# Patient Record
Sex: Female | Born: 1970 | Race: White | Hispanic: No | Smoking: Current every day smoker
Health system: Southern US, Community
[De-identification: ages and names within clinical notes are randomized; demographics above are authoritative.]

## PROBLEM LIST (undated history)

## (undated) DIAGNOSIS — L02612 Cutaneous abscess of left foot: Secondary | ICD-10-CM

## (undated) DIAGNOSIS — I5030 Unspecified diastolic (congestive) heart failure: Secondary | ICD-10-CM

## (undated) DIAGNOSIS — I340 Nonrheumatic mitral (valve) insufficiency: Secondary | ICD-10-CM

## (undated) DIAGNOSIS — F1920 Other psychoactive substance dependence, uncomplicated: Secondary | ICD-10-CM

## (undated) DIAGNOSIS — F329 Major depressive disorder, single episode, unspecified: Secondary | ICD-10-CM

## (undated) DIAGNOSIS — C801 Malignant (primary) neoplasm, unspecified: Secondary | ICD-10-CM

## (undated) DIAGNOSIS — L409 Psoriasis, unspecified: Secondary | ICD-10-CM

## (undated) DIAGNOSIS — I10 Essential (primary) hypertension: Secondary | ICD-10-CM

## (undated) DIAGNOSIS — K219 Gastro-esophageal reflux disease without esophagitis: Secondary | ICD-10-CM

## (undated) DIAGNOSIS — F1120 Opioid dependence, uncomplicated: Secondary | ICD-10-CM

## (undated) DIAGNOSIS — C539 Malignant neoplasm of cervix uteri, unspecified: Secondary | ICD-10-CM

## (undated) DIAGNOSIS — K3184 Gastroparesis: Secondary | ICD-10-CM

## (undated) DIAGNOSIS — F419 Anxiety disorder, unspecified: Secondary | ICD-10-CM

## (undated) DIAGNOSIS — F32A Depression, unspecified: Secondary | ICD-10-CM

## (undated) DIAGNOSIS — B1920 Unspecified viral hepatitis C without hepatic coma: Secondary | ICD-10-CM

## (undated) DIAGNOSIS — M1990 Unspecified osteoarthritis, unspecified site: Secondary | ICD-10-CM

## (undated) DIAGNOSIS — N289 Disorder of kidney and ureter, unspecified: Secondary | ICD-10-CM

## (undated) HISTORY — DX: Nonrheumatic mitral (valve) insufficiency: I34.0

## (undated) HISTORY — DX: Unspecified diastolic (congestive) heart failure: I50.30

## (undated) HISTORY — PX: OTHER SURGICAL HISTORY: SHX169

## (undated) HISTORY — DX: Psoriasis, unspecified: L40.9

## (undated) HISTORY — PX: LASER ABLATION CONDYLOMA CERVICAL / VULVAR: SUR819

## (undated) HISTORY — PX: APPENDECTOMY: SHX54

## (undated) HISTORY — DX: Anxiety disorder, unspecified: F41.9

## (undated) HISTORY — PX: TUBAL LIGATION: SHX77

## (undated) HISTORY — DX: Unspecified osteoarthritis, unspecified site: M19.90

---

## 2000-09-11 ENCOUNTER — Inpatient Hospital Stay (HOSPITAL_COMMUNITY): Admission: EM | Admit: 2000-09-11 | Discharge: 2000-09-15 | Payer: Self-pay | Admitting: *Deleted

## 2002-06-09 ENCOUNTER — Emergency Department (HOSPITAL_COMMUNITY): Admission: EM | Admit: 2002-06-09 | Discharge: 2002-06-09 | Payer: Self-pay | Admitting: Emergency Medicine

## 2005-09-12 ENCOUNTER — Emergency Department (HOSPITAL_COMMUNITY): Admission: EM | Admit: 2005-09-12 | Discharge: 2005-09-12 | Payer: Self-pay | Admitting: Emergency Medicine

## 2006-02-16 ENCOUNTER — Emergency Department (HOSPITAL_COMMUNITY): Admission: EM | Admit: 2006-02-16 | Discharge: 2006-02-16 | Payer: Self-pay | Admitting: Emergency Medicine

## 2006-02-24 ENCOUNTER — Emergency Department (HOSPITAL_COMMUNITY): Admission: EM | Admit: 2006-02-24 | Discharge: 2006-02-24 | Payer: Self-pay | Admitting: Emergency Medicine

## 2006-03-07 ENCOUNTER — Emergency Department (HOSPITAL_COMMUNITY): Admission: EM | Admit: 2006-03-07 | Discharge: 2006-03-07 | Payer: Self-pay | Admitting: Emergency Medicine

## 2006-04-09 ENCOUNTER — Emergency Department (HOSPITAL_COMMUNITY): Admission: EM | Admit: 2006-04-09 | Discharge: 2006-04-09 | Payer: Self-pay | Admitting: Emergency Medicine

## 2006-09-10 ENCOUNTER — Emergency Department (HOSPITAL_COMMUNITY): Admission: EM | Admit: 2006-09-10 | Discharge: 2006-09-10 | Payer: Self-pay | Admitting: Emergency Medicine

## 2006-09-12 ENCOUNTER — Emergency Department (HOSPITAL_COMMUNITY): Admission: EM | Admit: 2006-09-12 | Discharge: 2006-09-12 | Payer: Self-pay | Admitting: Emergency Medicine

## 2006-09-14 ENCOUNTER — Emergency Department (HOSPITAL_COMMUNITY): Admission: EM | Admit: 2006-09-14 | Discharge: 2006-09-14 | Payer: Self-pay | Admitting: Emergency Medicine

## 2006-09-15 ENCOUNTER — Emergency Department (HOSPITAL_COMMUNITY): Admission: EM | Admit: 2006-09-15 | Discharge: 2006-09-15 | Payer: Self-pay | Admitting: Emergency Medicine

## 2006-12-31 ENCOUNTER — Emergency Department (HOSPITAL_COMMUNITY): Admission: EM | Admit: 2006-12-31 | Discharge: 2006-12-31 | Payer: Self-pay | Admitting: Emergency Medicine

## 2007-02-02 ENCOUNTER — Emergency Department (HOSPITAL_COMMUNITY): Admission: EM | Admit: 2007-02-02 | Discharge: 2007-02-02 | Payer: Self-pay | Admitting: Emergency Medicine

## 2007-03-31 ENCOUNTER — Observation Stay (HOSPITAL_COMMUNITY): Admission: EM | Admit: 2007-03-31 | Discharge: 2007-04-02 | Payer: Self-pay | Admitting: Emergency Medicine

## 2007-04-01 ENCOUNTER — Ambulatory Visit: Payer: Self-pay | Admitting: Gastroenterology

## 2007-05-27 ENCOUNTER — Emergency Department (HOSPITAL_COMMUNITY): Admission: EM | Admit: 2007-05-27 | Discharge: 2007-05-27 | Payer: Self-pay | Admitting: *Deleted

## 2007-07-10 ENCOUNTER — Ambulatory Visit: Payer: Self-pay | Admitting: Internal Medicine

## 2007-07-10 DIAGNOSIS — K279 Peptic ulcer, site unspecified, unspecified as acute or chronic, without hemorrhage or perforation: Secondary | ICD-10-CM | POA: Insufficient documentation

## 2007-07-10 DIAGNOSIS — K219 Gastro-esophageal reflux disease without esophagitis: Secondary | ICD-10-CM | POA: Insufficient documentation

## 2007-07-10 DIAGNOSIS — M545 Low back pain, unspecified: Secondary | ICD-10-CM | POA: Insufficient documentation

## 2007-07-10 DIAGNOSIS — F329 Major depressive disorder, single episode, unspecified: Secondary | ICD-10-CM

## 2007-07-10 DIAGNOSIS — F411 Generalized anxiety disorder: Secondary | ICD-10-CM | POA: Insufficient documentation

## 2007-07-10 DIAGNOSIS — F32A Depression, unspecified: Secondary | ICD-10-CM | POA: Insufficient documentation

## 2007-07-10 DIAGNOSIS — Z87898 Personal history of other specified conditions: Secondary | ICD-10-CM

## 2007-07-10 DIAGNOSIS — M129 Arthropathy, unspecified: Secondary | ICD-10-CM | POA: Insufficient documentation

## 2007-07-10 DIAGNOSIS — Z8541 Personal history of malignant neoplasm of cervix uteri: Secondary | ICD-10-CM

## 2007-07-10 DIAGNOSIS — I10 Essential (primary) hypertension: Secondary | ICD-10-CM | POA: Insufficient documentation

## 2007-07-10 DIAGNOSIS — L408 Other psoriasis: Secondary | ICD-10-CM

## 2007-07-10 DIAGNOSIS — R05 Cough: Secondary | ICD-10-CM

## 2007-07-10 DIAGNOSIS — E785 Hyperlipidemia, unspecified: Secondary | ICD-10-CM | POA: Insufficient documentation

## 2007-07-10 DIAGNOSIS — R635 Abnormal weight gain: Secondary | ICD-10-CM

## 2007-07-10 DIAGNOSIS — N859 Noninflammatory disorder of uterus, unspecified: Secondary | ICD-10-CM | POA: Insufficient documentation

## 2007-07-13 ENCOUNTER — Telehealth (INDEPENDENT_AMBULATORY_CARE_PROVIDER_SITE_OTHER): Payer: Self-pay | Admitting: *Deleted

## 2007-07-13 ENCOUNTER — Encounter (INDEPENDENT_AMBULATORY_CARE_PROVIDER_SITE_OTHER): Payer: Self-pay | Admitting: Internal Medicine

## 2007-07-13 LAB — CONVERTED CEMR LAB
ALT: 21 units/L (ref 0–35)
Alkaline Phosphatase: 48 units/L (ref 39–117)
Sodium: 141 meq/L (ref 135–145)
Total Bilirubin: 0.2 mg/dL — ABNORMAL LOW (ref 0.3–1.2)
Total Protein: 6.8 g/dL (ref 6.0–8.3)
Triglycerides: 418 mg/dL — ABNORMAL HIGH (ref <150)

## 2007-07-20 ENCOUNTER — Telehealth (INDEPENDENT_AMBULATORY_CARE_PROVIDER_SITE_OTHER): Payer: Self-pay | Admitting: *Deleted

## 2007-07-27 ENCOUNTER — Emergency Department (HOSPITAL_COMMUNITY): Admission: EM | Admit: 2007-07-27 | Discharge: 2007-07-27 | Payer: Self-pay | Admitting: Emergency Medicine

## 2007-07-27 ENCOUNTER — Ambulatory Visit (HOSPITAL_COMMUNITY): Admission: RE | Admit: 2007-07-27 | Discharge: 2007-07-27 | Payer: Self-pay | Admitting: Internal Medicine

## 2007-07-28 ENCOUNTER — Emergency Department (HOSPITAL_COMMUNITY): Admission: EM | Admit: 2007-07-28 | Discharge: 2007-07-28 | Payer: Self-pay | Admitting: Emergency Medicine

## 2007-07-30 ENCOUNTER — Telehealth (INDEPENDENT_AMBULATORY_CARE_PROVIDER_SITE_OTHER): Payer: Self-pay | Admitting: Internal Medicine

## 2007-08-04 ENCOUNTER — Emergency Department (HOSPITAL_COMMUNITY): Admission: EM | Admit: 2007-08-04 | Discharge: 2007-08-04 | Payer: Self-pay | Admitting: Emergency Medicine

## 2007-08-12 ENCOUNTER — Ambulatory Visit: Payer: Self-pay | Admitting: Internal Medicine

## 2007-08-13 ENCOUNTER — Telehealth (INDEPENDENT_AMBULATORY_CARE_PROVIDER_SITE_OTHER): Payer: Self-pay | Admitting: *Deleted

## 2007-08-18 ENCOUNTER — Encounter: Admission: RE | Admit: 2007-08-18 | Discharge: 2007-08-18 | Payer: Self-pay | Admitting: Internal Medicine

## 2007-09-09 ENCOUNTER — Encounter: Admission: RE | Admit: 2007-09-09 | Discharge: 2007-09-09 | Payer: Self-pay | Admitting: Internal Medicine

## 2007-09-19 ENCOUNTER — Emergency Department (HOSPITAL_COMMUNITY): Admission: EM | Admit: 2007-09-19 | Discharge: 2007-09-19 | Payer: Self-pay | Admitting: Emergency Medicine

## 2007-10-16 ENCOUNTER — Encounter (INDEPENDENT_AMBULATORY_CARE_PROVIDER_SITE_OTHER): Payer: Self-pay | Admitting: Internal Medicine

## 2007-10-19 ENCOUNTER — Encounter: Admission: RE | Admit: 2007-10-19 | Discharge: 2007-10-19 | Payer: Self-pay | Admitting: Internal Medicine

## 2007-11-05 ENCOUNTER — Emergency Department (HOSPITAL_COMMUNITY): Admission: EM | Admit: 2007-11-05 | Discharge: 2007-11-05 | Payer: Self-pay | Admitting: Emergency Medicine

## 2007-11-12 ENCOUNTER — Ambulatory Visit: Payer: Self-pay | Admitting: Internal Medicine

## 2007-11-12 DIAGNOSIS — M542 Cervicalgia: Secondary | ICD-10-CM | POA: Insufficient documentation

## 2007-11-13 ENCOUNTER — Telehealth (INDEPENDENT_AMBULATORY_CARE_PROVIDER_SITE_OTHER): Payer: Self-pay | Admitting: *Deleted

## 2007-11-13 LAB — CONVERTED CEMR LAB
BUN: 9 mg/dL (ref 6–23)
Calcium: 9.4 mg/dL (ref 8.4–10.5)
Potassium: 4.2 meq/L (ref 3.5–5.3)
Sodium: 139 meq/L (ref 135–145)

## 2007-11-30 ENCOUNTER — Telehealth (INDEPENDENT_AMBULATORY_CARE_PROVIDER_SITE_OTHER): Payer: Self-pay | Admitting: *Deleted

## 2007-11-30 ENCOUNTER — Ambulatory Visit: Payer: Self-pay | Admitting: Internal Medicine

## 2007-12-04 ENCOUNTER — Emergency Department (HOSPITAL_COMMUNITY): Admission: EM | Admit: 2007-12-04 | Discharge: 2007-12-04 | Payer: Self-pay | Admitting: Emergency Medicine

## 2007-12-10 ENCOUNTER — Encounter (INDEPENDENT_AMBULATORY_CARE_PROVIDER_SITE_OTHER): Payer: Self-pay | Admitting: Internal Medicine

## 2007-12-22 ENCOUNTER — Ambulatory Visit: Payer: Self-pay | Admitting: Gastroenterology

## 2007-12-28 ENCOUNTER — Encounter (INDEPENDENT_AMBULATORY_CARE_PROVIDER_SITE_OTHER): Payer: Self-pay | Admitting: Internal Medicine

## 2008-01-04 ENCOUNTER — Ambulatory Visit (HOSPITAL_COMMUNITY): Admission: RE | Admit: 2008-01-04 | Discharge: 2008-01-04 | Payer: Self-pay | Admitting: Gastroenterology

## 2008-01-04 ENCOUNTER — Ambulatory Visit: Payer: Self-pay | Admitting: Gastroenterology

## 2008-01-04 ENCOUNTER — Encounter: Payer: Self-pay | Admitting: Gastroenterology

## 2008-01-04 HISTORY — PX: ESOPHAGOGASTRODUODENOSCOPY: SHX1529

## 2008-01-19 ENCOUNTER — Emergency Department (HOSPITAL_COMMUNITY): Admission: EM | Admit: 2008-01-19 | Discharge: 2008-01-19 | Payer: Self-pay | Admitting: Emergency Medicine

## 2008-01-28 ENCOUNTER — Ambulatory Visit: Payer: Self-pay | Admitting: Gastroenterology

## 2008-02-15 ENCOUNTER — Encounter (INDEPENDENT_AMBULATORY_CARE_PROVIDER_SITE_OTHER): Payer: Self-pay | Admitting: Internal Medicine

## 2008-02-16 ENCOUNTER — Encounter (INDEPENDENT_AMBULATORY_CARE_PROVIDER_SITE_OTHER): Payer: Self-pay | Admitting: Internal Medicine

## 2008-02-17 ENCOUNTER — Ambulatory Visit: Payer: Self-pay | Admitting: Gastroenterology

## 2008-02-18 ENCOUNTER — Encounter (INDEPENDENT_AMBULATORY_CARE_PROVIDER_SITE_OTHER): Payer: Self-pay | Admitting: Internal Medicine

## 2008-02-23 ENCOUNTER — Encounter (HOSPITAL_COMMUNITY): Admission: RE | Admit: 2008-02-23 | Discharge: 2008-03-24 | Payer: Self-pay | Admitting: Gastroenterology

## 2008-03-24 ENCOUNTER — Emergency Department (HOSPITAL_COMMUNITY): Admission: EM | Admit: 2008-03-24 | Discharge: 2008-03-24 | Payer: Self-pay | Admitting: Emergency Medicine

## 2008-04-17 ENCOUNTER — Emergency Department (HOSPITAL_COMMUNITY): Admission: EM | Admit: 2008-04-17 | Discharge: 2008-04-17 | Payer: Self-pay | Admitting: Emergency Medicine

## 2008-05-02 ENCOUNTER — Emergency Department (HOSPITAL_COMMUNITY): Admission: EM | Admit: 2008-05-02 | Discharge: 2008-05-02 | Payer: Self-pay | Admitting: Emergency Medicine

## 2008-07-18 ENCOUNTER — Emergency Department (HOSPITAL_COMMUNITY): Admission: EM | Admit: 2008-07-18 | Discharge: 2008-07-18 | Payer: Self-pay | Admitting: Emergency Medicine

## 2008-09-01 ENCOUNTER — Ambulatory Visit: Payer: Self-pay | Admitting: Gastroenterology

## 2008-09-15 ENCOUNTER — Encounter: Payer: Self-pay | Admitting: Gastroenterology

## 2008-09-15 ENCOUNTER — Ambulatory Visit (HOSPITAL_COMMUNITY): Admission: RE | Admit: 2008-09-15 | Discharge: 2008-09-15 | Payer: Self-pay | Admitting: Obstetrics and Gynecology

## 2008-09-17 ENCOUNTER — Encounter: Payer: Self-pay | Admitting: Gastroenterology

## 2008-09-17 ENCOUNTER — Emergency Department (HOSPITAL_COMMUNITY): Admission: EM | Admit: 2008-09-17 | Discharge: 2008-09-17 | Payer: Self-pay | Admitting: Emergency Medicine

## 2008-09-19 ENCOUNTER — Telehealth (INDEPENDENT_AMBULATORY_CARE_PROVIDER_SITE_OTHER): Payer: Self-pay

## 2008-09-19 LAB — CONVERTED CEMR LAB
Hgb A1c MFr Bld: 5.6 % (ref 4.6–6.1)
TSH: 1.214 microintl units/mL (ref 0.350–4.50)

## 2008-09-20 ENCOUNTER — Telehealth (INDEPENDENT_AMBULATORY_CARE_PROVIDER_SITE_OTHER): Payer: Self-pay | Admitting: *Deleted

## 2008-09-21 ENCOUNTER — Encounter: Payer: Self-pay | Admitting: Gastroenterology

## 2008-09-21 LAB — CONVERTED CEMR LAB
BUN: 9 mg/dL (ref 6–23)
Calcium: 9.2 mg/dL (ref 8.4–10.5)
Cortisol - AM: 8.5 ug/dL (ref 4.3–22.4)
Potassium: 4.1 meq/L (ref 3.5–5.3)

## 2008-09-22 LAB — CONVERTED CEMR LAB: Hemoglobin: 12.1 g/dL (ref 12.0–15.0)

## 2008-09-23 ENCOUNTER — Ambulatory Visit: Payer: Self-pay | Admitting: Gastroenterology

## 2008-09-23 ENCOUNTER — Encounter: Payer: Self-pay | Admitting: Gastroenterology

## 2008-09-23 DIAGNOSIS — K3184 Gastroparesis: Secondary | ICD-10-CM

## 2008-10-02 ENCOUNTER — Emergency Department (HOSPITAL_COMMUNITY): Admission: EM | Admit: 2008-10-02 | Discharge: 2008-10-02 | Payer: Self-pay | Admitting: Emergency Medicine

## 2008-10-04 ENCOUNTER — Encounter (HOSPITAL_COMMUNITY): Admission: RE | Admit: 2008-10-04 | Discharge: 2008-11-03 | Payer: Self-pay | Admitting: Emergency Medicine

## 2008-10-07 ENCOUNTER — Telehealth (INDEPENDENT_AMBULATORY_CARE_PROVIDER_SITE_OTHER): Payer: Self-pay

## 2008-12-30 ENCOUNTER — Encounter: Payer: Self-pay | Admitting: Gastroenterology

## 2009-01-22 ENCOUNTER — Emergency Department (HOSPITAL_COMMUNITY): Admission: EM | Admit: 2009-01-22 | Discharge: 2009-01-22 | Payer: Self-pay | Admitting: Emergency Medicine

## 2009-02-01 ENCOUNTER — Ambulatory Visit (HOSPITAL_COMMUNITY): Admission: RE | Admit: 2009-02-01 | Discharge: 2009-02-01 | Payer: Self-pay | Admitting: Obstetrics and Gynecology

## 2009-02-16 ENCOUNTER — Emergency Department (HOSPITAL_COMMUNITY): Admission: EM | Admit: 2009-02-16 | Discharge: 2009-02-16 | Payer: Self-pay | Admitting: Emergency Medicine

## 2009-02-18 ENCOUNTER — Emergency Department (HOSPITAL_COMMUNITY): Admission: EM | Admit: 2009-02-18 | Discharge: 2009-02-18 | Payer: Self-pay | Admitting: Emergency Medicine

## 2009-03-09 ENCOUNTER — Emergency Department (HOSPITAL_COMMUNITY): Admission: EM | Admit: 2009-03-09 | Discharge: 2009-03-09 | Payer: Self-pay | Admitting: Emergency Medicine

## 2009-03-12 ENCOUNTER — Emergency Department (HOSPITAL_COMMUNITY): Admission: EM | Admit: 2009-03-12 | Discharge: 2009-03-12 | Payer: Self-pay | Admitting: Emergency Medicine

## 2009-03-28 ENCOUNTER — Ambulatory Visit (HOSPITAL_COMMUNITY): Admission: RE | Admit: 2009-03-28 | Discharge: 2009-03-28 | Payer: Self-pay | Admitting: Obstetrics and Gynecology

## 2009-04-19 ENCOUNTER — Emergency Department (HOSPITAL_COMMUNITY): Admission: EM | Admit: 2009-04-19 | Discharge: 2009-04-19 | Payer: Self-pay | Admitting: Emergency Medicine

## 2009-04-21 ENCOUNTER — Encounter: Payer: Self-pay | Admitting: Gastroenterology

## 2009-05-10 ENCOUNTER — Encounter: Payer: Self-pay | Admitting: Urgent Care

## 2009-05-15 ENCOUNTER — Emergency Department (HOSPITAL_COMMUNITY): Admission: EM | Admit: 2009-05-15 | Discharge: 2009-05-15 | Payer: Self-pay | Admitting: Emergency Medicine

## 2009-05-25 ENCOUNTER — Ambulatory Visit (HOSPITAL_COMMUNITY): Admission: RE | Admit: 2009-05-25 | Discharge: 2009-05-25 | Payer: Self-pay | Admitting: Obstetrics and Gynecology

## 2009-06-20 ENCOUNTER — Emergency Department (HOSPITAL_COMMUNITY): Admission: EM | Admit: 2009-06-20 | Discharge: 2009-06-20 | Payer: Self-pay | Admitting: Emergency Medicine

## 2009-12-19 ENCOUNTER — Emergency Department (HOSPITAL_COMMUNITY): Admission: EM | Admit: 2009-12-19 | Discharge: 2009-12-19 | Payer: Self-pay | Admitting: Emergency Medicine

## 2010-02-06 ENCOUNTER — Emergency Department (HOSPITAL_COMMUNITY): Admission: EM | Admit: 2010-02-06 | Discharge: 2010-02-06 | Payer: Self-pay | Admitting: Emergency Medicine

## 2010-02-28 ENCOUNTER — Emergency Department (HOSPITAL_COMMUNITY): Admission: EM | Admit: 2010-02-28 | Discharge: 2010-02-28 | Payer: Self-pay | Admitting: Emergency Medicine

## 2010-03-06 ENCOUNTER — Emergency Department (HOSPITAL_COMMUNITY): Admission: EM | Admit: 2010-03-06 | Discharge: 2010-03-07 | Payer: Self-pay | Admitting: Emergency Medicine

## 2010-05-09 ENCOUNTER — Encounter (HOSPITAL_COMMUNITY): Admission: RE | Admit: 2010-05-09 | Payer: Self-pay | Admitting: Orthopaedic Surgery

## 2010-07-11 ENCOUNTER — Emergency Department (HOSPITAL_COMMUNITY)
Admission: EM | Admit: 2010-07-11 | Discharge: 2010-07-12 | Payer: Self-pay | Source: Home / Self Care | Admitting: Emergency Medicine

## 2010-08-12 ENCOUNTER — Encounter: Payer: Self-pay | Admitting: Internal Medicine

## 2010-08-12 ENCOUNTER — Encounter: Payer: Self-pay | Admitting: Obstetrics and Gynecology

## 2010-08-13 ENCOUNTER — Encounter: Payer: Self-pay | Admitting: Obstetrics and Gynecology

## 2010-10-01 LAB — CBC
HCT: 38.7 % (ref 36.0–46.0)
MCV: 92.4 fL (ref 78.0–100.0)
RBC: 4.19 MIL/uL (ref 3.87–5.11)
WBC: 12.4 10*3/uL — ABNORMAL HIGH (ref 4.0–10.5)

## 2010-10-01 LAB — POCT CARDIAC MARKERS
CKMB, poc: <1 ng/mL — ABNORMAL LOW (ref 1.0–8.0)
Myoglobin, poc: 21.1 ng/mL (ref 12–200)
Troponin i, poc: <0.05 ng/mL (ref 0.00–0.09)
Troponin i, poc: <0.05 ng/mL (ref 0.00–0.09)

## 2010-10-01 LAB — DIFFERENTIAL
Eosinophils Relative: 1 % (ref 0–5)
Lymphocytes Relative: 31 % (ref 12–46)
Lymphs Abs: 3.8 10*3/uL (ref 0.7–4.0)
Monocytes Absolute: 0.5 10*3/uL (ref 0.1–1.0)
Monocytes Relative: 4 % (ref 3–12)

## 2010-10-01 LAB — BASIC METABOLIC PANEL
CO2: 27 mEq/L (ref 19–32)
Sodium: 139 mEq/L (ref 135–145)

## 2010-10-05 LAB — URINE MICROSCOPIC-ADD ON

## 2010-10-05 LAB — BASIC METABOLIC PANEL
BUN: 9 mg/dL (ref 6–23)
Calcium: 9.1 mg/dL (ref 8.4–10.5)
Creatinine, Ser: 0.58 mg/dL (ref 0.4–1.2)
GFR calc non Af Amer: >60 mL/min (ref >60)
Glucose, Bld: 118 mg/dL — ABNORMAL HIGH (ref 70–99)

## 2010-10-05 LAB — POCT I-STAT, CHEM 8
Chloride: 108 mEq/L (ref 96–112)
Glucose, Bld: 76 mg/dL (ref 70–99)
HCT: 39 % (ref 36.0–46.0)
Potassium: 4.1 mEq/L (ref 3.5–5.1)

## 2010-10-05 LAB — URINALYSIS, ROUTINE W REFLEX MICROSCOPIC
Bilirubin Urine: NEGATIVE
Bilirubin Urine: NEGATIVE
Ketones, ur: NEGATIVE mg/dL
Nitrite: NEGATIVE
Nitrite: NEGATIVE
Specific Gravity, Urine: >1.03 — ABNORMAL HIGH (ref 1.005–1.030)
pH: 5.5 (ref 5.0–8.0)
pH: 6 (ref 5.0–8.0)

## 2010-10-06 LAB — URINE MICROSCOPIC-ADD ON

## 2010-10-06 LAB — URINALYSIS, ROUTINE W REFLEX MICROSCOPIC
Bilirubin Urine: NEGATIVE
Ketones, ur: NEGATIVE mg/dL
Nitrite: NEGATIVE
Urobilinogen, UA: 1 mg/dL (ref 0.0–1.0)

## 2010-10-24 LAB — URINALYSIS, ROUTINE W REFLEX MICROSCOPIC
Glucose, UA: NEGATIVE mg/dL
Leukocytes, UA: NEGATIVE
Specific Gravity, Urine: 1.025 (ref 1.005–1.030)
pH: 6.5 (ref 5.0–8.0)

## 2010-10-24 LAB — URINE MICROSCOPIC-ADD ON

## 2010-10-24 LAB — BASIC METABOLIC PANEL
BUN: 11 mg/dL (ref 6–23)
Calcium: 9.6 mg/dL (ref 8.4–10.5)
Creatinine, Ser: 0.47 mg/dL (ref 0.4–1.2)
GFR calc non Af Amer: >60 mL/min (ref >60)
Glucose, Bld: 88 mg/dL (ref 70–99)
Potassium: 3.6 mEq/L (ref 3.5–5.1)

## 2010-10-24 LAB — RAPID STREP SCREEN (MED CTR MEBANE ONLY): Streptococcus, Group A Screen (Direct): NEGATIVE

## 2010-10-26 LAB — HCG, QUANTITATIVE, PREGNANCY: hCG, Beta Chain, Quant, S: <2 m[IU]/mL (ref <5)

## 2010-10-26 LAB — BASIC METABOLIC PANEL
BUN: 10 mg/dL (ref 6–23)
Calcium: 9.9 mg/dL (ref 8.4–10.5)
Chloride: 103 mEq/L (ref 96–112)
Creatinine, Ser: 0.53 mg/dL (ref 0.4–1.2)
GFR calc Af Amer: >60 mL/min (ref >60)

## 2010-10-26 LAB — CBC
MCV: 90.6 fL (ref 78.0–100.0)
Platelets: 326 10*3/uL (ref 150–400)
WBC: 9.8 10*3/uL (ref 4.0–10.5)

## 2010-11-28 ENCOUNTER — Emergency Department (HOSPITAL_COMMUNITY)
Admission: EM | Admit: 2010-11-28 | Discharge: 2010-11-28 | Disposition: A | Discharge status: Home / Self Care | Payer: Medicaid Other | Attending: Emergency Medicine | Admitting: Emergency Medicine

## 2010-11-28 ENCOUNTER — Emergency Department (HOSPITAL_COMMUNITY): Payer: Medicaid Other

## 2010-11-28 DIAGNOSIS — M5430 Sciatica, unspecified side: Secondary | ICD-10-CM | POA: Insufficient documentation

## 2010-11-28 DIAGNOSIS — L408 Other psoriasis: Secondary | ICD-10-CM | POA: Insufficient documentation

## 2010-11-28 DIAGNOSIS — L259 Unspecified contact dermatitis, unspecified cause: Secondary | ICD-10-CM | POA: Insufficient documentation

## 2010-11-28 DIAGNOSIS — F329 Major depressive disorder, single episode, unspecified: Secondary | ICD-10-CM | POA: Insufficient documentation

## 2010-11-28 DIAGNOSIS — R112 Nausea with vomiting, unspecified: Secondary | ICD-10-CM | POA: Insufficient documentation

## 2010-11-28 DIAGNOSIS — Z8541 Personal history of malignant neoplasm of cervix uteri: Secondary | ICD-10-CM | POA: Insufficient documentation

## 2010-11-28 DIAGNOSIS — Z79899 Other long term (current) drug therapy: Secondary | ICD-10-CM | POA: Insufficient documentation

## 2010-11-28 DIAGNOSIS — F3289 Other specified depressive episodes: Secondary | ICD-10-CM | POA: Insufficient documentation

## 2010-11-28 DIAGNOSIS — I10 Essential (primary) hypertension: Secondary | ICD-10-CM | POA: Insufficient documentation

## 2010-11-28 DIAGNOSIS — M549 Dorsalgia, unspecified: Secondary | ICD-10-CM | POA: Insufficient documentation
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
## 2010-12-04 NOTE — Consult Note (Signed)
[name], [name]           ACCOUNT NO.:  [id number]   MEDICAL RECORD NO.:  [id number]          PATIENT TYPE:  OBV   LOCATION:  A306                          FACILITY:  [facility]   PHYSICIAN:  Tilda Burrow, M.D. DATE OF BIRTH:  [birth year]   DATE OF CONSULTATION:  04/01/2007  DATE OF DISCHARGE:                                 CONSULTATION   REASON FOR CONSULTATION:  Consulted regarding heavy periods and pelvic  pain.   CHIEF COMPLAINT:  For this admission is abdominal pain in the right  lower quadrant which the patient describes as sharp in nature, of 2 to 3  days' duration as described in Dr. Vara Guardian admitting note.   HISTORY OF PRESENT ILLNESS:  Today she is much better and so far CT has  suggested that there is a small fibroid on the uterus and followup  ultrasound indicates a small cyst on the left ovary and within normal-  sized ovaries. There is no suspicion for cul-de-sac fluid. There is no  suspicion for abscess per radiology.   LABORATORY DATA:  Laboratory evaluation includes white count of 10,700  on 9/9, improving to 9,500. The patient has a significant hemodilution  with hemoglobin initially 14.1, but after rehydration hemoglobin is 11.8  which is more likely close to her baseline.   Admitting urinalysis showed a specific gravity of 1.10. Electrolytes are  within normal limits with BUN dropping from 13 to 9 hydration.   REVIEW OF SYSTEMS:  GYN: Notable for a gravida 2, para 2; children ages  75 and 3. Former patient of Dr. Preston Fleeting.   PAST SURGICAL HISTORY:  Taken by Dr. Lilian Kapur indicates that she had  surgery for cervical cancer but the patient denies having abnormal  Paps. We will review Dr. Preston Fleeting records which are available through  our office for this as a followup as an outpatient.   Menses are described as lasting 5 to 7 days, sometimes as much as 9 days  with heavy flow. Last menstrual period was 6 days in length, from the  21st through the  25th, and was accompanied by lots of pelvic discomfort.  She has light clotting with her periods and describes her menses as  unbearable with clothing accidents due to heaviness of the flow at  least 1 to 2 times per menstrual cycle. The patient is ready to get rid  of it all.   Ultrasound is reviewed. See x-ray report.   IMPRESSION:  Functional ovarian cysts, small uterus with possible early  fibroid development. No obvious deformity of the endometrial cavity to  explain the heavy bleeding but will evaluate this further as an  outpatient. Given that patient smokes and is over 35 oral contraceptives  are not an option for menstrual control, but will discuss  endometrial ablation or other efforts at solving her heavy menstrual  flow as an outpatient once we have reviewed Dr. Preston Fleeting records  regarding the cervix and update Pap smear, etc.   Thank you for the consult.      Tilda Burrow, M.D.  Electronically Signed     JVF/MEDQ  D:  04/01/2007  T:  04/02/2007  Job:  [id number]

## 2010-12-04 NOTE — Assessment & Plan Note (Signed)
[name], ROSEMAN            CHART#:  [id number]   DATE:  02/17/2008                       DOB:  02-Sep-1970   PRIMARY PHYSICIAN:  Dr. Reynolds Bowl   PROBLEM LIST:  1. Erosive esophagitis on EGD in June 2009.  2. Uncontrolled gastroesophageal reflux disease on b.i.d. proton pump      inhibitor, confirmed by Bravo study in June 2009.  3. Hypertension.  4. Hyperlipidemia.  5. Fibroids.  6. Anxiety.  7. Insomnia.   SUBJECTIVE:  The patient is a 40 year old female who presents as a  return patient visit.  She was last seen in July 2009.  She was changed  from Prilosec to Capadex.  She reports that the Capadex is not helping.  She is taking it 30 minutes prior to meals.  She has not had a surgery  referral.  She still smokes half-a-pack a day.  She has gained 8 pounds  since July 2009, and 18 pounds since September 2008.  She reports  coughing which leads to vomiting.  When she bends over, liquid contents  comes up in her mouth and then she vomits.  Last night she had problems  all night long.  The last time she had problems was 2-3 days before  that.  She continues on the Capadex twice a day.   MEDICATIONS:  1. Capadex twice a day.  2. Lisinopril.  3. Klonopin.  4. Carafate 1 teaspoon 4 times a day.  5. Viscous lidocaine as needed.  6. Tylenol as needed.  7. Amitriptyline 100 mg nightly.   OBJECTIVE:  VITALS SIGNS:  Weight 190 pounds, height 5 feet 3 inches,  BMI 33.7 (obese), temperature 97.9, blood pressure 112/70, and pulse 92.  GENERAL:  She is in no apparent distress.  Alert and oriented x4. LUNGS:  Clear to auscultation bilaterally.CARDIOVASCULAR:  Regular rhythm, no  murmur.  ABDOMEN:  Bowel sounds are present.  Soft, nontender, and nondistended.  No Rebound or guarding, obese.EXTREMITIES:  No cyanosis or edema.SKIN:  Multiple maculopapular lesions on upper and lower extremity.NEUROLOGIC:  No focal neurologic deficits.   ASSESSMENT:  The patient is a  40 year old female who has a known history  of uncontrolled gastroesophageal reflux disease.  Her disease has been  documented on Bravo study.  She has failed b.i.d. PPI therapy and needs  a surgical evaluation.  Thank you for allowing me to see the patient in  consultation.  My recommendations follow.   RECOMMENDATIONS:  1. She may continue the Carafate 1 teaspoon 4 times a day.  She can      stop the viscous lidocaine, which she says does not help.  She can      continue the Kapidex once daily or Nexium twice a day.  She is      given a prescription for both.  She is to fill the one that      Medicaid will pay for.  2. She requested a prescription for Phenergan and is given Phenergan      25 mg tablet half to one p.o. every 6 hours as needed for nausea      and vomiting #30 refill x5.  3. She is asked to discontinue the use of amitriptyline and use      imipramine nightly for pain and for sleep.  She said the  amitriptyline did not help.  She will start off on 50 mg at bedtime      for 3 days and then increase to 100 mg at bedtime.  4. I asked her to completely stop smoking and to lose 10-20 pounds.  5. She is given a referral to Dr. Girtha Rm or Dr. Lovell Sheehan for a Nissen      fundoplication.  6. She will have a gastric emptying study prior to her visit with      surgery.  7. She has a follow up appointment to see me in 2 months.   ADDENDUM:  GES: 52% retained at two hours c/ gastroparesis.       Kassie Mends, M.D.  Electronically Signed     SM/MEDQ  D:  02/17/2008  T:  02/18/2008  Job:  [id number]   cc:   Dr. Reynolds Bowl

## 2010-12-04 NOTE — Op Note (Signed)
[name], [name]           ACCOUNT NO.:  [id number]   MEDICAL RECORD NO.:  [id number]          PATIENT TYPE:  AMB   LOCATION:  DAY                           FACILITY:  [facility]   PHYSICIAN:  Kassie Mends, M.D.      DATE OF BIRTH:  18-Jul-1971   DATE OF PROCEDURE:  01/04/2008  DATE OF DISCHARGE:  01/04/2008                               OPERATIVE REPORT   PROCEDURE:  Bravo capsule study.   REFERRING PHYSICIAN:  Reynolds Bowl.   INDICATION FOR EXAM:  [name] is a 40 year old female who presents  with persistent symptoms of heartburn on Prilosec twice a day.  The  Bravo study is being performed to evaluate for uncontrolled  gastroesophageal reflux disease.   FINDINGS:  On day one the study period lasted 23 hours and 47 minutes.  She had 79 episodes of reflux.  The longest episode of reflux lasted 2  minutes.  She spent 25 minutes at a pH less than 4 in her esophagus.  Her Demeester score on day 1 was  8.8.  Normal is less than 14.72.  On  day 2 the study period lasted 23 hours in 42 minutes.  She had 125  episodes of reflux.  Six episodes lasted greater than 5 minutes.  The  longest episode of reflux was 43 minutes.  She spent 154 minutes at pH  less than 4.  Her Demeester score on day 2 was 33.4.  Her symptoms  association probability was 91% for regurgitation, 82.6% with cough and  36.7% with heartburn.   DIAGNOSIS:  [name] is a 40 year old female who has uncontrolled  gastroesophageal reflux disease on Prilosec twice a day.  She had an  upper endoscopy which revealed evidence of erosive esophagitis in the  distal esophagus.   RECOMMENDATIONS:  1. Will refer [name] for surgery referral for Nissen      fundoplication.  2. She should continue the Prilosec 30 minutes before breakfast and      supper.  3. If her pain persists, then would consider adding a tricyclic      antidepressant.  4. She should continue Carafate 4 times a day and Maalox with viscous   lidocaine as needed for chest pain.      Kassie Mends, M.D.  Electronically Signed     SM/MEDQ  D:  01/18/2008  T:  01/18/2008  Job:  [id number]   cc:   Reynolds Bowl

## 2010-12-04 NOTE — H&P (Signed)
NAMECATHLIN, [name]           ACCOUNT NO.:  [id number]   MEDICAL RECORD NO.:  [id number]          PATIENT TYPE:  OBV   LOCATION:  A306                          FACILITY:  [facility]   PHYSICIAN:  Skeet Latch, DO    DATE OF BIRTH:  [birth year]   DATE OF ADMISSION:  03/31/2007  DATE OF DISCHARGE:  LH                              HISTORY & PHYSICAL   CHIEF COMPLAINT:  Abdominal pain.   HISTORY OF PRESENT ILLNESS:  This is a 40 year old Caucasian female who  presents with abdominal pain.  The patient states for past 2-3 days, she  has been having sharp left right lower quadrant pain.  The patient  states the pain is sharp in nature, rates it 10/10, had some associated  nausea and diarrhea.  The patient also admits to having some abdominal  swelling she believes for the past few months.  The patient has no  significant medical history other than hypertension, headaches and  anxiety.   PAST MEDICAL HISTORY:  As above:  1. Anxiety.  2. Hypertension.  3. Headaches.   SURGICAL HISTORY:  1. Appendectomy.  2  Surgery for cervical cancer.  1. Facial surgery .  2. Tubal ligation.   SOCIAL HISTORY:  Half pack per day smoker for over 20 years denies any  drug use, is a social drinker.   ALLERGIES:  DARVOCET, TORADOL, ULTRACET.   HOME MEDICATION:  1. Xanax 1 mg t.i.d. p.r.n.  2. Celexa unknown dose.  3. HCTZ 25 mg daily.  4. Trazodone, she takes an unknown dose of 1-3 tablets q.h.s.  5. Lipitor 40 mg daily.  6. Cymbalta 60 mg daily.   REVIEW OF SYSTEMS:  CONSTITUTIONAL:  Excessive fatigue, some weight  gain.  HEENT is unremarkable.  GI: Positive nausea, vomiting, diarrhea.  GENITOURINARY: No dysuria, urgency.  MUSCULOSKELETAL: No arthralgias,  arthritis or back pain.  PSYCHIATRIC:  Positive for anxiety.  All other  systems are negative.   PHYSICAL EXAMINATION:  VITAL SIGNS:  Temperature is 90.1, pulse 71,  respirations 20, blood pressure 117/70,  patient saturating 98% on room  air.  HEENT:  Head is atraumatic, normocephalic.  PERLA.  EOMI.  NECK:  Soft, supple, not tender, nondistended.  No JVD or thyromegaly.  CARDIOVASCULAR:  Regular rate and rhythm.  No murmurs, rubs or gallops.  RESPIRATORY:  Lungs clear to auscultation.  No rales, rhonchi or  wheezing.  ABDOMEN:  Soft, a little distended, obese.  She has some mild right  lower quadrant tenderness with some associated epigastric tenderness.  No guarding.  EXTREMITIES: No clubbing, cyanosis or edema.   LABORATORY DATA:  Drug screen was positive for opiates and  benzodiazepines.  Urinalysis was unremarkable.  Urine pregnancy test  negative.  Total bilirubin 0.7, AST is 18, ALT 20, alk-phos 49.  Sodium  135, potassium 3.4, chloride 98, CO2 26, glucose 91, BUN 13, creatinine  0.59.  White count 10.7, hemoglobin 14.1, hematocrit 41.1, platelets  375.  CT of her abdomen showed no abnormal inflammatory processes, small  low density fibroids, fatty liver, mild atherosclerotic aorta  bifurcation disease.  Abdominal ultrasound showed 1.90  cm left ovarian  cyst.   ASSESSMENT:  1. Abdominal pain.  2. Hypertension.  3. History of panic attacks/anxiety.   PLAN:  1. The patient will be admitted.  The patient will be kept n.p.o. with      IV pain medications.  GI will be consulted.  Get labs in a.m. will      which include an amylase and lipase.  2. Hypertension seems to be stable at this time.  Continue home      medications.  3. Panic attack/anxiety.  Patient is currently on Celexa and Xanax.      The patient also will be IV hydrated.  The patient is in      observation.      Skeet Latch, DO  Electronically Signed     SM/MEDQ  D:  03/31/2007  T:  04/01/2007  Job:  [id number]

## 2010-12-04 NOTE — Op Note (Signed)
[name], [name]           ACCOUNT NO.:  [id number]   MEDICAL RECORD NO.:  [id number]          PATIENT TYPE:  AMB   LOCATION:  DAY                           FACILITY:  [facility]   PHYSICIAN:  Kassie Mends, M.D.      DATE OF BIRTH:  1971-05-18   DATE OF PROCEDURE:  01/04/2008  DATE OF DISCHARGE:                               OPERATIVE REPORT   REFERRING PHYSICIAN:  Abbey Chatters. Comstock, MD.   PROCEDURE:  Esophagogastroduodenoscopy with cold forceps biopsy and  Bravo capsule placement.   INDICATION FOR EXAM:  [name] is a 40 year old female who  complaints of refractory heartburn on Prilosec twice a day.  She also  reported having melena associated with her episodes of vomiting.  She  regularly uses Goody powders.   FINDINGS:  1. Linear erosions extending from the squamocolumnar junction      consistent with esophagitis.  Less than a 3-cm segment of her      distal esophagus was involved.  Otherwise, no Barrett's ulceration,      stricture, or mass seen.  2. Hiatal hernia.  Patchy erosions seen in the antrum.  Biopsies      obtained via cold forceps to evaluate for H. pylori gastritis.      Normal duodenal bulb and second portion of the duodenum.  3. Bravo capsule place 28 cm from the teeth. Packwood junction at 34 cm from      the teeth.   DIAGNOSES:  1. Erosive esophagitis on b.i.d. PPI, Bravo study pending.  2. Small hiatal hernia.  3. Gastritis.   RECOMMENDATIONS:  1. Will call [name] with results of her biopsies.  2. She should avoid Goody powders indefinitely.  3. She should continue Prilosec 30 minutes before breakfast and      supper.  4. The Bravo study will last for 48 hours.  5. No aspirin, NSAIDs for 30 days.  No anticoagulation for 5 days.  6. She should follow a high-fiber diet.  She was given a handout on      high-fiber diet, reflux gastritis, and hiatal hernia.  7. She already has a followup appointment to see me in August 2009.  8. Future endoscopy  with propofol.   MEDICATIONS:  1. Demerol 175 mg IV.  2. Versed 12 mg IV.  3. Phenergan 37.5 mg IV.   PROCEDURE TECHNIQUE:  Physical exam was performed.  Informed consent was  obtained from the patient after explaining the benefits, risks, and  alternatives to the procedure.  The patient was connected to the monitor  and placed in the left lateral position.  Continuous oxygen was provided  by nasal cannula.  IV medicine administered through an indwelling  cannula.  After administration of sedation, the patient's esophagus was  intubated and the scope was advanced under direct visualization to the  second portion of the duodenum.  The scope was removed slowly by  carefully examining the color, texture, anatomy, and integrity of the  mucosa on the way out.  The esophagus was then intubated with the Bravo  capsule introducer, and advanced to 28 cm from the  teeth. Suction was  applied for one minute. The capsule was released. The patient's  esophagus was then intubated with the diagnostic gastroscope to confirm  adherence to the side wall. The gastroscope was removed. The patient was  recovered in endoscopy and discharged home in satisfactory condition.   ADDENDUM 16109:  Patient called still having syptoms. May need TCA  and/or surgeyr referral. [facility] on 01/21/08-pt cancelled [facility] for 01/28/08.      Kassie Mends, M.D.  Electronically Signed     SM/MEDQ  D:  01/04/2008  T:  01/05/2008  Job:  [id number]
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
## 2010-12-04 NOTE — Assessment & Plan Note (Signed)
[name], [name]            CHART#:  [id number]   DATE:  01/28/2008                       DOB:  1971/05/02   CHIEF COMPLAINT:  Hurting in my esophagus.   SUBJECTIVE:  The patient is here for a followup visit.  She complains of  refractory reflux symptoms.  She was last seen at the time of EGD and  Bravo capsule placement.  This was on January 04, 2008.  She had linear  erosions extending from the squamocolumnar junction consistent with  esophagitis.  Less than 3-cm segment of her distal esophagus was  involved.  She had a hiatal hernia.  Patchy erosions were seen in her  antrum.  Biopsies were negative for H. pylori.  Bravo capsule was placed  for a 48-hour pH study.  This was done on Prilosec 30 mg b.i.d.  Study  was very abnormal with day one result showing 79 episodes of reflux with  25 minutes at pH less than 4.  On day two, she had 125 episodes of  reflux with 154 minutes at pH less than 4.  Her symptoms association  probably ability with 91% for regurgitation, 82.6% with cough, and 36.7%  with heartburn.  The patient continues to take Prilosec b.i.d., Carafate  1 teaspoon every 4 hours, and viscous lidocaine every 4 hours.  She  states she has constant burning in her chest.  It keeps her up at night  as well.  She has regurgitation 3 to 4 times a day.  She feels bubbling  or boiling in her esophagus.  She states it feels like she has a cup of  soup in her esophagus at all times.  Denies any dysphagia, odynophagia,  nausea, vomiting, abdominal pain, constipation, diarrhea, melena, or  rectal bleeding.  Her weight has been stable.   CURRENT MEDICATIONS:  1. Prilosec b.i.d.  2. Lisinopril 12.5 mg daily.  3. Klonopin 0.5 mg t.i.d.  4. Carafate 1 teaspoon q.4 h.  5. Viscous lidocaine q.4 h. p.r.n.   ALLERGIES:  TORADOL, DARVOCET, and ULTRAM.   PHYSICAL EXAMINATION:  VITAL SIGNS:  Weight 182, temperature 98.4, blood  pressure 100/72, and pulse 80.  GENERAL:  A pleasant,  obese Caucasian female in no acute distress.  ABDOMEN:  Positive bowel sounds.  Soft, nontender, and nondistended.  No  organomegaly or masses.  No rebound or guarding.  SKIN:  Warm and dry.  No jaundice.  Diffuse psoriasis noted.   IMPRESSION:  The patient is a 40 year old lady with refractory  gastroesophageal reflux disease on Prilosec 20 mg b.i.d.  Refractory  gastroesophageal reflux disease well documented by abnormality seen on  EGD as well as positive Bravo pH study.  She has not been on any of the  other proton pump inhibitor therapies.  There has been consideration for  possible referral for Nissen fundoplication.  For now, we will try a  different proton pump inhibitor and see if she has any better response.   PLAN:  1. Stop Prilosec and begin Kapidex 60 mg, 30 minutes before breakfast.      If after 3 days she has had no response, we will go ahead and let      her increase to b.i.d. 30 minutes before breakfast and 30 minutes      before evening meal, #25, samples were provided.  2. She  will call in the first week with the progress report.  3. We will discuss further with Dr. Cira [name] regarding to her plans for      referral for possible Nissen fundoplication.  4. The patient was advised to continue antireflux measures as      discussed.       [name], P.A.  Electronically Signed     [name], M.D.  Electronically Signed    LL/MEDQ  D:  01/28/2008  T:  01/29/2008  Job:  [id number]

## 2010-12-04 NOTE — Assessment & Plan Note (Signed)
[name], [name]            CHART#:  [id number]   DATE:  09/01/2008                       DOB:  Jul 14, 1971   PRIMARY PHYSICIAN:  Abbey Chatters. Jorene Guest, MD   PROBLEM LIST:  1. Erosive esophagitis secondary to uncontrolled gastroesophageal      reflux disease on b.i.d. proton pump inhibitor therapy confirmed by      Bravo study in June 2009.  She is not a surgical candidate due to      gastroparesis and risk of gas/bloat syndrome.  2. Hypertension.  3. Hyperlipidemia.  4. Fibroids.  5. Anxiety.  6. Insomnia.  7. Obesity.   SUBJECTIVE:  The patient is a 40 year old female who presents as a  return patient visit.  She was last seen in July 2009.  She is sent for  gastric emptying study, it showed retention of 52% at 2 hours, which is  consistent with gastroparesis.  She is given a referral to reevaluate  for Nissen fundoplication.  Because of delayed gastric emptying, she is  not a candidate for Nissen fundoplication.  She is supposed to have labs  drawn to complete evaluation for her gastroparesis, but she did not.  She was asked to lose 10-20 pounds and follow a gastroparesis diet.  She  was supposed to followup in October 2009, but was unable to.   She presents today saying her reflux is bad.  Sometimes she has puke in  the back of my throat.  She states she can eat.  She has gained 6  pounds since July 2009.  She has taken the Kapidex twice a day and the  Carafate once a day.  She is taking amitriptyline for sleep, but it does  not help.  She continues to smoke a pack a day and she had questions in  regards to her gastric emptying study.   MEDICATIONS:  1. Kapidex twice a day.  2. Lisinopril.  3. Klonopin.  4. Carafate once a day.  5. Tylenol as needed.  6. Amitriptyline as needed.   OBJECTIVE:  VITAL SIGNS:  Weight 196 pounds, height 5 feet 3 inches, BMI  34.7 (obese), temperature 97.6, blood pressure 130/90, pulse 92.  GENERAL:  She is in no apparent distress.   Alert and oriented x4.HEENT:  Atraumatic, normocephalic.  Pupils equal and react to light.  Mouth, no  oral lesions.  Posterior pharynx without erythema or exudate.NECK:  Full  range of motion.  No lymphadenopathy.LUNGS:  Clear to auscultation  bilaterally. CARDIOVASCULAR:  Regular rhythm.  No murmur.  Normal S1,  S2.ABDOMEN:  Bowel sounds are present, soft, nontender, nondistended,  obese.EXTREMITIES:  No cyanosis or edema.  NEURO:  She has no focal neurologic deficits.   ASSESSMENT:  The patient is a 40 year old female who has multiple habits  that are contributing to her uncontrolled gastroesophageal reflux  disease.  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  Thank you for allowing  me to see the patient in consultation.  My recommendations follow.   RECOMMENDATIONS:  1. Will add Reglan 5 mg twice a day 30 minutes before meals.  It is an      effort to have better control of her gastroesophageal reflux      disease.  I discussed that the medication side effects include  drowsiness, blurry vision, drainage from her breast, involuntary      movements of the hands, neck, and face, or jitteriness.  2. She will also had baclofen 10 mg twice a day 30 minutes before      meals.  I explained to her that may cause nausea and vomiting and      the medications usually used intrathecally for spasm.  3. I gave her a prescription for Phenergan to be used as needed for      nausea.  She also requested a prescription for viscous lidocaine.      I had given a prescription for viscous lidocaine and may take 10 mL      or 20 mL of Maalox every 4 hours as needed for chest pain, #200      refill x5.  4. She is to continue the Kapidex.  5. She is to follow up in 3 months.  I did explain to her that she      needs to stop smoking and lose weight as well as follow a low-fat      diet.  She also requested pain medicine.  I explained  to her that      the combination of Reglan, baclofen, and Kapidex should take care      of her uncontrolled gastroesophageal reflux disease until she is      able to modify her lifestyle factors.  Her medications were sent      electronically.       Kassie Mends, M.D.  Electronically Signed     SM/MEDQ  D:  09/01/2008  T:  09/01/2008  Job:  [id number]   cc:   Abbey Chatters. Jorene Guest, MD

## 2010-12-04 NOTE — Discharge Summary (Signed)
[name], [name]           ACCOUNT NO.:  [id number]   MEDICAL RECORD NO.:  [id number]          PATIENT TYPE:  OBV   LOCATION:  A306                          FACILITY:  [facility]   PHYSICIAN:  Osvaldo Shipper, MD     DATE OF BIRTH:  1971-07-06   DATE OF ADMISSION:  03/31/2007  DATE OF DISCHARGE:  09/11/2008LH                               DISCHARGE SUMMARY   Please review H&P dictated by Dr. Lilian Kapur for details regarding the  patient's presenting illness.   DISCHARGE DIAGNOSES:  1. Abdominal pain likely related to gastroenteritis.  2. Possible uterine fibroid requiring outpatient followup.  3. History of hypertension, stable.  4. History of dyslipidemia, stable.   BRIEF HOSPITAL COURSE:  Briefly, this is a 40 year old Caucasian female  who presented complaining of abdominal pain 2 days ago.  She was having  nausea, vomiting, and diarrhea as well.  The patient was also  complaining of some abdominal swelling or distention for the past few  months.  The patient was seen in the emergency department.  She  underwent blood work which showed mild hypokalemia, otherwise was quite  unremarkable.  She subsequently had a CAT scan of her abdomen and pelvis  which showed fatty liver, mild atherosclerotic changes, suggestion of a  fibroid on the left aspect of the uterus, but otherwise no other  inflammatory  or other findings were noted.  This was followed by an  ultrasound of the pelvic area which showed no significant findings  except for a small cyst on the left ovary.  In the meantime, the patient continued to have significant abdominal  pain.  She was having nausea and emesis as well as diarrhea.  This  prompted a GI consultation and GI felt that she was having  gastroenteritis.  Possibly, her chronic symptoms could be related to  irritable bowel.  They recommended adding Zantac, FloraQ, as well as  Bentyl.  In the meantime, her diarrhea subsided.  She is having very sporadic  episodes of emesis.  She tolerated her dinner last night.  This morning  she had some breakfast too.  She did have 1 small episode of emesis this  morning, none since then.  She was also seen by gynecologist, Dr. Emelda Fear, who recommended  outpatient followup for her gynecological issues.  Laboratory, again,  showed mild hypokalemia.  Her pancreatic enzymes and liver enzymes are  all within normal limits.  So, essentially the etiology for her symptoms  most likely gastroenteritis.  I am not sure of the etiology for her  abdominal distention as of this time, but I think the rest of the workup  can be done by GI and GYN as an outpatient.  This morning the patient is complaining of minimal abdominal pain,  saying that sometimes it is radiating to the back.  The pain is located  in the lower abdomen mostly on the right side.  Her vital signs are all  stable.  She is sitting comfortably on the bed in no distress.  Abdomen  is soft.  There is mild tenderness in the lower area but no rebound,  rigidity,  or guarding is present.   ASSESSMENT/PLAN:  She has been seen by gynecology and  gastroenterologist.  She has had extensive imaging testing done.  There  is no critical issue or urgent issue at this time.  I think the rest of  the workup can be completed outpatient.  She can continue on symptomatic  treatment as an outpatient.   DISCHARGE MEDICATIONS:  1. Bentyl 10 mg t.i.d.  2. Zantac 150 mg every evening.  3. FloraQ 1 tablet p.o. daily.  4. Phenergan 25 mg suppository every 6 hours as needed for nausea.  5. Lortab 5/500, one to two tablets every 6 hours as needed for pain.  6. Xanax 1 mg as needed.  7. Celexa 60 mg p.o. daily.  8. HCTZ 25 mg p.o. daily.  9. Trazodone 100 mg q.h.s. as needed.  10.Lipitor 40 mg p.o. daily.   She will be given one dose of potassium chloride before she leaves the  hospital.   FOLLOWUP:  To be arranged with GI in one week, gynecologist in two weeks  - Dr.  Emelda Fear.   DIET:  She can have a heart healthy diet.   PHYSICAL ACTIVITY:  No restrictions.   CONSULTATIONS:  1. Tilda Burrow, M.D.  2. Kassie Mends, M.D.   TOTAL TIME AT DISCHARGE:  Thirty-five minutes.      Osvaldo Shipper, MD  Electronically Signed     GK/MEDQ  D:  04/02/2007  T:  04/02/2007  Job:  [id number]   cc:   Kassie Mends, M.D.  [address]  [place] , [place] [address]   Tilda Burrow, M.D.  Fax: [phone]

## 2010-12-07 NOTE — Discharge Summary (Signed)
[facility]  Patient:    [name], [name]                  MRN: [id number] Adm. Date:  [id number] Disc. Date: [id number] Attending:  Otilio Saber Dictator:   Johnella Moloney, N.P.                           Discharge Summary  HISTORY OF PRESENT ILLNESS:  Ms. [name] is a 40 year old white separated female on a voluntary admission to [facility] stating "I need help getting off pills."  She described that she was trying to stop her benzodiazepines and Lortab last week for four days by herself and she felt like she was going to die.  The patient reported a one and a half year history of abusing OxyContin, Xanax, and Valium, which she gets off the street.  Originally she linked the feeling that Tylox gave her when it was prescribed for postpartum four years ago and that was the beginning of her addiction.  She has for the past three years been using OxyContin, most recently about 80 mg per day for six months, last use Tuesday, February 19.  Xanax 1 mg taking approximately eight per day for more than six months; last use Tuesday.  Lortab 7.5/500 about 10 per day which were prescribed for her; last use February 19.  She has been feeling depressed for six to seven years since her father died.  Decreased appetite with a weight loss of approximately 18 pounds over the past year, decreased sleep, progressive anhedonia for the past two years, increased crying spells and sadness.  The patients goal is to get into Narcotic Anonymous close to her home.  No homicidal ideation, no suicidal ideation, and no auditory or visual hallucinations.  PAST PSYCHIATRIC HISTORY:  The patient has had no previous psychiatric inpatient or outpatient treatment.  PAST MEDICAL HISTORY:  Primary care physician: Dr. Saddie Benders at [facility].  Medical problems: Psoriasis of hands, history of cervical cancer 14 years ago, status post bilateral  tubal ligation 2001, bone spurs in her neck.  Current medications: Klonopin 1 mg t.i.d., Lortab 7.5/500 one t.i.d. p.r.n.  Drug allergies: No known drug allergies.  Physical examination: Please see her physical examination; basically showed some reddened psoriatic plaques over the dorsal surface of both hands, bilaterally over the finger prominence, over both elbows, and along the dorsal surfaces of both feet.  All areas characterized by crusty plaque, redness, and scattered crusted papules, some redness and crusting across the arch of her ______ bilaterally.  Skin is pale in tone; otherwise smooth.  Otherwise, her physical examination was within normal limits with no positive findings.  Lab work: Hemogram: WBC slightly elevated at 11.2.  CMET: Chloride elevated at 113.  ALP low at 36. Thyroid profile was within normal limits with the exception of T3 uptake high at 41.5; TSH was within normal limits.  Urine drug screen was positive for marijuana.  Urinalysis: Within normal limits.  HOSPITAL COURSE:  The [facility] to detoxify her off opiates and benzodiazepines.  She was placed on a Wytensin protocol to detoxify her off opiates, Klonopin 2 mg t.i.d. to detoxify her off benzodiazepines.  She was put on amoxicillin 500 mg t.i.d. x 10 days for acute sinusitis and Humibid LA one b.i.d. x 5 days.  On February 23, we started Celexa 20 mg daily  and while she was in the hospital, the patient did fairly well.  She did have some signs and symptoms of opiate withdrawal, abdominal cramps and diarrhea.  She was anxious but she appeared to be motivated to get clean.  On February 25, she reported feeling better.  She denied any suicidal ideation, withdrawal symptoms, sleeping and eating well, mood and affect euthymic.  It was decided she could be handled on an outpatient basis safely and it was decided she could be discharged.  CONDITION ON DISCHARGE:  The patient  was discharged in improved condition with improvement in her mood, sleep, and appetite, no suicidal or homicidal ideations, and improvement in her energy, no psychotic symptoms, no signs or symptoms of withdrawal.  DISPOSITION:  The patient was discharged home.  FOLLOWUP: 1. [facility] March 1 at 10:30 a.m. with Serita Sheller. 2. NA daily.  DISCHARGE MEDICATIONS: 1. Celexa 20 mg one tablet q.a.m. 2. Klonopin 1 mg five times a day for one week and then one tablet q.i.d. 3. Amoxicillin 500 mg one tablet t.i.d.  FINAL DIAGNOSES: Axis I:    1. Depression, not otherwise specified.            2. Opiate dependence.            3. Benzodiazepines dependence. Axis II:   Deferred. Axis III:  1. Psoriasis.            2. Bone spurs on her neck. Axis IV:   Mild related to problems with her social environment and substance            abuse. Axis V:    Current global assessment of functioning 53, highest in the past            year 60. DD:  10/13/00 TD:  10/13/00 Job: [id number]

## 2010-12-07 NOTE — H&P (Signed)
[facility]  Patient:    [name], [name]                  MRN: [id number] Adm. Date:  [id number] Attending:  Otilio Saber Dictator:   Young Berry Scott, N.P.                         History and Physical  REVIEW OF SYSTEMS:  Remarkable for patient smokes 1 pack per day of cigarettes for 13 years, with occasional nonproductive cough.  She does complain of a left earache that has been occurring intermittently for the past 2 weeks. NEURO:  Patient denies any history of seizures, history of falling, head injury or memory impairment.  GI:  Patients bowel pattern is twice daily without laxatives.  MSK:  Patient complains of spurs on her neck at the base of her neck and back, which is being treated by an orthopedist. Patient does complain of occasional neck pain, however she states that she has no pain at this time.  REPRODUCTIVE: Patient is status post bilateral tubal ligation. She is just finishing her menses as of yesterday, and denies any problems with STDs, vaginal drainage, or abdominal pain.  PREVENTIVE CARE:  Patient wears corrective lenses.  She last saw her eye doctor within the past year.  Last pap smear was 6 months ago.  She has never had a mammogram in her life.  Date of last dental visit was approximately 8 months ago.  SKIN:  Patient has psoriasis on her hands that she reports and she has a cream at home that she did not bring with her that she does use for this.  PHYSICAL EXAMINATION:  CONSTITUTION:  This is a 40 year old separated white female, slight build, somewhat disheveled.  She is in no acute distress.  She is relaxed and cooperative during the exam.  She is generally healthy in appearance, although her nose is somewhat red and runny, and her vital signs were previously stated.  SKIN:  Patient has reddened psoriatic plaques over the dorsal surface of both hands bilaterally over the fenar prominence, over both elbows and along  the dorsal surfaces of both feet.  All areas are characterized by crusty plaque, redness, and scattered, crusted papules.  Patient also has some redness and crusting across the arch of her pinna bilaterally.  The patients skin is pale in tone and otherwise smooth.  She has thick, blond hair in a shag haircut.  HEAD is normocephalic, craned somewhat forward because of what she states is pain in her neck.  No lesions.  Eyes:  Pupils are round and reactive to light and accommodation; however the right pupil is about 1 mm larger than the left, but they are equally reactive.  Sclera is clear, conjunctiva are clear.  Right external ear canal and TM are within normal limits.  Left external ear canal is clear and the TM is reddened but landmarks are visible with no air-fluid line.  Patient has tenderness to palpation over the left maxillary sinus. Other facial areas are without tenderness.  Nasal mucosa is reddened and boggy with a small amount of yellow mucus present, more so on the left than on the right.  Oral mucosa is pink and moist.  Palatine tonsils are 1+ without redness.  Uvula is midline.  Tongue is midline without fasciculations.  NECK:  No evidence of thyromegaly.  AC and PC nodes are 1+.  CARDIOVASCULAR:  S1 and S2  present, no clicks, murmurs or gallops.  Carotids are 2+ with bruit, no abdominal bruits heard.  Radial and pedal pulses are 2+ and no edema noted.  CHEST is symmetrical, lungs are clear.  ABDOMEN:  Bowel sounds present in all 4 quadrants.  No abdominal tenderness, no CVA tenderness.  GENITALIA:  Deferred.  MUSCULOSKELETAL:  Patients posture is stooped with rounded shoulders, with her neck craned forward.  She has tenderness to palpation that is mild, over C6-7.  She complains of pain with neck hyperextension.  She has full range of motion right, left and  with flexion without pain.  Her gait is normal.  Her strength is 5/5.  NEURO:  Extraocular movements are  intact without nystagmus.  Sensory is intact.  Motor movements are smooth without tremor.  Romberg is with findings. Deep tendon reflexes are 2+ out of 5.  Babinski is negative.  Cerebellar function is intact to rapid alternating movements. DD:  09/12/00 TD:  09/13/00 Job: [id number]

## 2010-12-07 NOTE — H&P (Signed)
[facility]  Patient:    [name], PIGEON                  MRN: [id number] Adm. Date:  [id number] Attending:  Otilio Saber Dictator:   Young Berry Lorin Picket, N.P.                   Psychiatric Admission Assessment  DATE OF ADMISSION:  September 11, 2000.  IDENTIFYING INFORMATION:  This is a 40 year old white separated female who is a voluntary admission to [facility], stating "I need help getting off the pills." She describes that she has tried to stop her benzos and Lortab last week for 4 days by herself and felt like she was going to die.  REASON FOR ADMISSION AND SYMPTOMS:  Patient reports a 1-1/2 year history of abusing OxyContin, Xanax and Valium, which she has gotten off the street. Originally, she liked the feeling that Tylox gave her when it was prescribed for her postpartum 4 years ago and that was the beginning of her addiction. She has for the past 3 years been using OxyContin, most recently about 80 mg per day for more than 6 months, last used Tuesday, February 19, Xanax 1 mg tablets taking approximately 8 per day for more than 6 months, last use Wednesday February 20, and Valium 10 tabs, using up to about 10 per day, last use Tuesday, February 19, and Lortabs 7.5/500 about 10 per day, which were prescribed for her, last used February 19.  Patient also reports feeling depressed for about 6-7 years since her father died, with decreased appetite, with a weight loss of approximately 18 pounds over the past year, decreased sleep, with middle and terminal insomnia, getting approximately 3-4 hours of sleep per night.  She has had progressive anhedonia for the past 2 years and increased crying spells and sadness.  Patients goal is to get into Narcotics Anonymous close to her home.  She denies any homicidal ideation, denies any auditory or visual hallucinations.  PAST PSYCHIATRIC HISTORY:  Negative.  No history of outpatient or  inpatient treatment.  SOCIAL HISTORY:  Patient was born in [place], [place], raised in [place], educated through the 8th grade and has received her GED.  She is currently unemployed.  She has been married x 1 and is now separated.  She has two children, aged 4 and 16 months.  She currently lives with her mother in a house in [place].  She is going through a divorce from her husband who is a crack and OxyContin user, and she does have a supportive boyfriend in her life.  FAMILY HISTORY:  Positive for a father who is an alcoholic, now deceased.  ALCOHOL AND DRUG HISTORY:  Drug history already stated; alcohol history is negative.  PAST MEDICAL HISTORY:  Primary care physician is Dr. Jenelle Mages at [facility].  Medical problems include psoriasis of her hands, history of cervical cancer approximately 14 years ago which was resolved with laser surgery.  She is status post bilateral tubal ligation in 2001, and she currently has bone spurs in her neck and is being treated by an orthopedist in [place].  Medications are Klonopin 1 mg t.i.d. prescribed by Dr. Jenelle Mages and Lortabs 7.5/500 1 t.i.d. p.r.n. which are prescribed by her orthopedist.  DRUG ALLERGIES:  No known drug allergies.  POSITIVE PHYSICAL FINDINGS:  Temp 97.7 on admission, pulse 82, blood pressure 140/85, respirations 20.  She is 5 feet 2 inches tall,  weighs 107 pounds, states that her baseline weight is 125 pounds.  She denies any neck pain at this time.  She is currently having some abdominal cramping.  She has vomited her breakfast and is feeling shaky inside and so p.r.n. medications on the Wytensin protocol were ordered during the course of the interview, and symptoms seemed to subside.  Physical examination and laboratory studies are pending.  MENTAL STATUS EXAMINATION:  This is a casually dressed, slightly disheveled, small build female having abdominal cramps with some nausea,  otherwise is cooperative and pleasant.  Her affect is appropriate.  Her speech is normal in pace and tone.  Mood is sad and depressed.  Thought is logical and goal directed.  Negative for suicidal ideation, homicidal ideation, or auditory or visual hallucinations.  No psychotic symptoms noted.  No delusions noted. Cognitively, she is oriented x 3 and memory is intact.  Her insight is good, her judgment is good.  ADMISSION DIAGNOSES: Axis I:    1. Depression not otherwise specified.            2. Opiate dependence.            3. Benzodiazepine dependence. Axis II:   Deferred. Axis III:  Psoriasis and bone spurs in her neck. Axis IV:   Moderate problems related to the social environment. Axis V:    Current 40, past year 35.  INITIAL PLAN OF CARE:  We will admit this patient for detox from opiates and benzos and for further evaluation of her depression symptoms.  We will put q.15 minute checks in place for safety.  We will begin a Wytensin protocol and place her on Klonopin 1 mg 2 tabs t.i.d. for her benzodiazepine withdrawal.  TENTATIVE LENGTH OF STAY:  Two to four days. DD:  09/12/00 TD:  09/12/00 Job: [id number]

## 2011-01-01 ENCOUNTER — Emergency Department (HOSPITAL_COMMUNITY)
Admission: EM | Admit: 2011-01-01 | Discharge: 2011-01-01 | Disposition: A | Discharge status: Home / Self Care | Payer: Medicaid Other | Attending: Emergency Medicine | Admitting: Emergency Medicine

## 2011-01-01 DIAGNOSIS — H9209 Otalgia, unspecified ear: Secondary | ICD-10-CM | POA: Insufficient documentation

## 2011-01-01 DIAGNOSIS — R112 Nausea with vomiting, unspecified: Secondary | ICD-10-CM | POA: Insufficient documentation

## 2011-01-01 DIAGNOSIS — Z8541 Personal history of malignant neoplasm of cervix uteri: Secondary | ICD-10-CM | POA: Insufficient documentation

## 2011-01-01 DIAGNOSIS — F329 Major depressive disorder, single episode, unspecified: Secondary | ICD-10-CM | POA: Insufficient documentation

## 2011-01-01 DIAGNOSIS — R197 Diarrhea, unspecified: Secondary | ICD-10-CM | POA: Insufficient documentation

## 2011-01-01 DIAGNOSIS — G8929 Other chronic pain: Secondary | ICD-10-CM | POA: Insufficient documentation

## 2011-01-01 DIAGNOSIS — F411 Generalized anxiety disorder: Secondary | ICD-10-CM | POA: Insufficient documentation

## 2011-01-01 DIAGNOSIS — L259 Unspecified contact dermatitis, unspecified cause: Secondary | ICD-10-CM | POA: Insufficient documentation

## 2011-01-01 DIAGNOSIS — M549 Dorsalgia, unspecified: Secondary | ICD-10-CM | POA: Insufficient documentation

## 2011-01-01 DIAGNOSIS — I10 Essential (primary) hypertension: Secondary | ICD-10-CM | POA: Insufficient documentation

## 2011-01-01 DIAGNOSIS — R109 Unspecified abdominal pain: Secondary | ICD-10-CM | POA: Insufficient documentation

## 2011-01-01 DIAGNOSIS — Z79899 Other long term (current) drug therapy: Secondary | ICD-10-CM | POA: Insufficient documentation

## 2011-01-01 DIAGNOSIS — F3289 Other specified depressive episodes: Secondary | ICD-10-CM | POA: Insufficient documentation

## 2011-01-01 DIAGNOSIS — L408 Other psoriasis: Secondary | ICD-10-CM | POA: Insufficient documentation

## 2011-01-01 DIAGNOSIS — H9190 Unspecified hearing loss, unspecified ear: Secondary | ICD-10-CM | POA: Insufficient documentation

## 2011-01-01 LAB — URINALYSIS, ROUTINE W REFLEX MICROSCOPIC
Ketones, ur: NEGATIVE mg/dL
Nitrite: NEGATIVE
Specific Gravity, Urine: >1.03 — ABNORMAL HIGH (ref 1.005–1.030)
Urobilinogen, UA: 0.2 mg/dL (ref 0.0–1.0)

## 2011-01-01 LAB — DIFFERENTIAL
Lymphocytes Relative: 42 % (ref 12–46)
Monocytes Absolute: 0.4 10*3/uL (ref 0.1–1.0)
Monocytes Relative: 4 % (ref 3–12)
Neutro Abs: 4.5 10*3/uL (ref 1.7–7.7)

## 2011-01-01 LAB — CBC
HCT: 37.2 % (ref 36.0–46.0)
Hemoglobin: 12.5 g/dL (ref 12.0–15.0)
MCH: 30.2 pg (ref 26.0–34.0)
MCHC: 33.6 g/dL (ref 30.0–36.0)
MCV: 89.9 fL (ref 78.0–100.0)

## 2011-01-01 LAB — COMPREHENSIVE METABOLIC PANEL
Alkaline Phosphatase: 40 U/L (ref 39–117)
BUN: 14 mg/dL (ref 6–23)
Calcium: 9.9 mg/dL (ref 8.4–10.5)
Creatinine, Ser: 0.59 mg/dL (ref 0.4–1.2)
GFR calc Af Amer: >60 mL/min (ref >60)
Glucose, Bld: 112 mg/dL — ABNORMAL HIGH (ref 70–99)
Total Protein: 6.5 g/dL (ref 6.0–8.3)

## 2011-01-01 LAB — URINE MICROSCOPIC-ADD ON

## 2011-01-01 LAB — LIPASE, BLOOD: Lipase: 13 U/L (ref 11–59)

## 2011-01-28 ENCOUNTER — Emergency Department (HOSPITAL_COMMUNITY)
Admission: EM | Admit: 2011-01-28 | Discharge: 2011-01-28 | Discharge status: Against Medical Advice | Payer: Medicaid Other | Attending: Emergency Medicine | Admitting: Emergency Medicine

## 2011-01-28 ENCOUNTER — Other Ambulatory Visit: Payer: Self-pay

## 2011-01-28 DIAGNOSIS — R079 Chest pain, unspecified: Secondary | ICD-10-CM | POA: Insufficient documentation

## 2011-01-28 HISTORY — DX: Depression, unspecified: F32.A

## 2011-01-28 HISTORY — DX: Major depressive disorder, single episode, unspecified: F32.9

## 2011-01-28 HISTORY — DX: Essential (primary) hypertension: I10

## 2011-01-28 HISTORY — DX: Malignant (primary) neoplasm, unspecified: C80.1

## 2011-01-28 NOTE — ED Notes (Signed)
Pt presents with left side constant chest pain pt states she feels like an elephant is sitting on her chest. Pt denies all other symptoms. Pt staes she has cardiac Hx.

## 2011-01-28 NOTE — ED Notes (Signed)
Left prior to ed physician evaluation, called x 3 without any response

## 2011-04-12 LAB — POCT CARDIAC MARKERS
CKMB, poc: 3
CKMB, poc: 3.8
Myoglobin, poc: 48.4
Troponin i, poc: <0.05

## 2011-04-12 LAB — BASIC METABOLIC PANEL
BUN: 7
GFR calc non Af Amer: >60
Glucose, Bld: 113 — ABNORMAL HIGH
Potassium: 3.8

## 2011-04-12 LAB — CBC
HCT: 33.6 — ABNORMAL LOW
Platelets: 328
RDW: 13.2

## 2011-04-12 LAB — PROTIME-INR: INR: 0.8

## 2011-04-16 LAB — URINALYSIS, ROUTINE W REFLEX MICROSCOPIC
Ketones, ur: NEGATIVE
Nitrite: NEGATIVE
Protein, ur: NEGATIVE
Urobilinogen, UA: 2 — ABNORMAL HIGH

## 2011-04-16 LAB — DIFFERENTIAL
Basophils Absolute: 0.1
Basophils Relative: 1
Eosinophils Absolute: 0.3
Eosinophils Relative: 3
Lymphs Abs: 2.4
Neutrophils Relative %: 63

## 2011-04-16 LAB — CBC
HCT: 35.8 — ABNORMAL LOW
MCHC: 35.1
MCV: 95.2
Platelets: 319
RDW: 13
WBC: 9.3

## 2011-04-16 LAB — BASIC METABOLIC PANEL
GFR calc Af Amer: >60
GFR calc non Af Amer: >60
Glucose, Bld: 111 — ABNORMAL HIGH
Potassium: 4.3
Sodium: 135

## 2011-04-16 LAB — PREGNANCY, URINE: Preg Test, Ur: NEGATIVE

## 2011-04-17 LAB — CBC
Hemoglobin: 12
MCHC: 36
MCV: 94.3
RBC: 3.54 — ABNORMAL LOW
WBC: 6.3

## 2011-04-17 LAB — COMPREHENSIVE METABOLIC PANEL
ALT: 75 — ABNORMAL HIGH
AST: 70 — ABNORMAL HIGH
CO2: 27
Calcium: 9.2
Chloride: 105
GFR calc Af Amer: >60
GFR calc non Af Amer: >60
Glucose, Bld: 86
Sodium: 139
Total Bilirubin: 0.3

## 2011-04-17 LAB — DIFFERENTIAL
Basophils Absolute: 0.1
Basophils Relative: 1
Eosinophils Absolute: 0.2
Eosinophils Relative: 3
Neutrophils Relative %: 39 — ABNORMAL LOW

## 2011-04-17 LAB — URINALYSIS, ROUTINE W REFLEX MICROSCOPIC
Bilirubin Urine: NEGATIVE
Glucose, UA: NEGATIVE
Hgb urine dipstick: NEGATIVE
Protein, ur: 30 — AB
Urobilinogen, UA: 0.2

## 2011-04-17 LAB — URINE MICROSCOPIC-ADD ON

## 2011-04-17 LAB — LIPASE, BLOOD: Lipase: 23

## 2011-04-17 LAB — POCT CARDIAC MARKERS: Operator id: 264761

## 2011-04-22 LAB — URINE CULTURE: Colony Count: >=100000

## 2011-04-22 LAB — URINALYSIS, ROUTINE W REFLEX MICROSCOPIC
Bilirubin Urine: NEGATIVE
Glucose, UA: NEGATIVE
Hgb urine dipstick: NEGATIVE
Specific Gravity, Urine: 1.015
Urobilinogen, UA: 0.2
pH: 6

## 2011-04-24 LAB — URINALYSIS, ROUTINE W REFLEX MICROSCOPIC
Bilirubin Urine: NEGATIVE
Hgb urine dipstick: NEGATIVE
Ketones, ur: NEGATIVE
Nitrite: NEGATIVE
Specific Gravity, Urine: >1.03 — ABNORMAL HIGH

## 2011-04-24 LAB — URINE MICROSCOPIC-ADD ON

## 2011-04-24 LAB — CBC
HCT: 37.2
MCHC: 34.5
Platelets: 351
RDW: 12.9

## 2011-04-24 LAB — COMPREHENSIVE METABOLIC PANEL
Albumin: 4.1
Alkaline Phosphatase: 53
BUN: 10
Calcium: 9.3
Creatinine, Ser: 0.53
Glucose, Bld: 102 — ABNORMAL HIGH
Potassium: 3.7
Total Protein: 7.3

## 2011-04-24 LAB — DIFFERENTIAL
Lymphocytes Relative: 35
Lymphs Abs: 3.4
Monocytes Absolute: 0.6
Monocytes Relative: 6
Neutro Abs: 5.5
Neutrophils Relative %: 56

## 2011-04-26 LAB — URINALYSIS, ROUTINE W REFLEX MICROSCOPIC
Bilirubin Urine: NEGATIVE
Glucose, UA: NEGATIVE mg/dL
Specific Gravity, Urine: 1.01 (ref 1.005–1.030)
Urobilinogen, UA: 0.2 mg/dL (ref 0.0–1.0)
pH: 7 (ref 5.0–8.0)

## 2011-04-26 LAB — CBC
HCT: 39.8 % (ref 36.0–46.0)
MCV: 92 fL (ref 78.0–100.0)
Platelets: 311 10*3/uL (ref 150–400)
RBC: 4.33 MIL/uL (ref 3.87–5.11)
WBC: 10 10*3/uL (ref 4.0–10.5)

## 2011-04-26 LAB — BASIC METABOLIC PANEL
BUN: 9 mg/dL (ref 6–23)
Chloride: 106 mEq/L (ref 96–112)
GFR calc Af Amer: >60 mL/min (ref >60)
GFR calc non Af Amer: >60 mL/min (ref >60)
Potassium: 4 mEq/L (ref 3.5–5.1)

## 2011-04-26 LAB — DIFFERENTIAL
Eosinophils Relative: 0 % (ref 0–5)
Lymphocytes Relative: 15 % (ref 12–46)
Lymphs Abs: 1.5 10*3/uL (ref 0.7–4.0)
Monocytes Relative: 3 % (ref 3–12)
Neutrophils Relative %: 82 % — ABNORMAL HIGH (ref 43–77)

## 2011-04-26 LAB — URINE MICROSCOPIC-ADD ON

## 2011-04-30 LAB — D-DIMER, QUANTITATIVE: D-Dimer, Quant: 0.22

## 2011-05-03 LAB — DIFFERENTIAL
Basophils Absolute: 0.2 — ABNORMAL HIGH
Basophils Relative: 2 — ABNORMAL HIGH
Eosinophils Absolute: 0.1
Eosinophils Absolute: 0.2
Eosinophils Relative: 3
Lymphocytes Relative: 50 — ABNORMAL HIGH
Lymphs Abs: 4.1 — ABNORMAL HIGH
Lymphs Abs: 4.7 — ABNORMAL HIGH
Monocytes Absolute: 0.5
Monocytes Relative: 5
Neutrophils Relative %: 43
Neutrophils Relative %: 60

## 2011-05-03 LAB — CBC
MCHC: 34.4
MCHC: 34.7
MCV: 93.4
MCV: 93.8
Platelets: 375
RBC: 3.52 — ABNORMAL LOW
RBC: 3.62 — ABNORMAL LOW
RDW: 12.9
WBC: 8.4

## 2011-05-03 LAB — COMPREHENSIVE METABOLIC PANEL
ALT: 16
AST: 14
AST: 15
CO2: 27
CO2: 29
Calcium: 8.7
Chloride: 107
Creatinine, Ser: 0.5
GFR calc Af Amer: >60
GFR calc Af Amer: >60
GFR calc non Af Amer: >60
GFR calc non Af Amer: >60
Glucose, Bld: 83
Sodium: 142
Total Bilirubin: 0.4

## 2011-05-03 LAB — HEPATIC FUNCTION PANEL
Albumin: 4.3
Total Bilirubin: 0.7
Total Protein: 7.6

## 2011-05-03 LAB — URINALYSIS, ROUTINE W REFLEX MICROSCOPIC
Glucose, UA: NEGATIVE
Hgb urine dipstick: NEGATIVE
Ketones, ur: NEGATIVE
Protein, ur: NEGATIVE
Urobilinogen, UA: 0.2

## 2011-05-03 LAB — BASIC METABOLIC PANEL
BUN: 13
CO2: 26
Chloride: 98
Creatinine, Ser: 0.59

## 2011-05-03 LAB — FERRITIN: Ferritin: 20 (ref 10–291)

## 2011-05-03 LAB — AMYLASE: Amylase: 41

## 2011-05-03 LAB — RAPID URINE DRUG SCREEN, HOSP PERFORMED
Amphetamines: NOT DETECTED
Barbiturates: NOT DETECTED
Opiates: POSITIVE — AB

## 2011-05-03 LAB — TSH: TSH: 2.975

## 2011-05-03 LAB — LIPASE, BLOOD
Lipase: 16
Lipase: 17

## 2011-08-05 ENCOUNTER — Emergency Department (HOSPITAL_COMMUNITY)
Admission: EM | Admit: 2011-08-05 | Discharge: 2011-08-05 | Disposition: A | Discharge status: Home / Self Care | Payer: Self-pay | Attending: Emergency Medicine | Admitting: Emergency Medicine

## 2011-08-05 ENCOUNTER — Encounter (HOSPITAL_COMMUNITY): Payer: Self-pay | Admitting: *Deleted

## 2011-08-05 ENCOUNTER — Emergency Department (HOSPITAL_COMMUNITY): Payer: Self-pay

## 2011-08-05 DIAGNOSIS — Z859 Personal history of malignant neoplasm, unspecified: Secondary | ICD-10-CM | POA: Insufficient documentation

## 2011-08-05 DIAGNOSIS — R319 Hematuria, unspecified: Secondary | ICD-10-CM | POA: Insufficient documentation

## 2011-08-05 DIAGNOSIS — Z79899 Other long term (current) drug therapy: Secondary | ICD-10-CM | POA: Insufficient documentation

## 2011-08-05 DIAGNOSIS — I10 Essential (primary) hypertension: Secondary | ICD-10-CM | POA: Insufficient documentation

## 2011-08-05 DIAGNOSIS — F3289 Other specified depressive episodes: Secondary | ICD-10-CM | POA: Insufficient documentation

## 2011-08-05 DIAGNOSIS — F329 Major depressive disorder, single episode, unspecified: Secondary | ICD-10-CM | POA: Insufficient documentation

## 2011-08-05 DIAGNOSIS — R1110 Vomiting, unspecified: Secondary | ICD-10-CM | POA: Insufficient documentation

## 2011-08-05 DIAGNOSIS — F17200 Nicotine dependence, unspecified, uncomplicated: Secondary | ICD-10-CM | POA: Insufficient documentation

## 2011-08-05 DIAGNOSIS — R10819 Abdominal tenderness, unspecified site: Secondary | ICD-10-CM | POA: Insufficient documentation

## 2011-08-05 DIAGNOSIS — R109 Unspecified abdominal pain: Secondary | ICD-10-CM | POA: Insufficient documentation

## 2011-08-05 DIAGNOSIS — Z87442 Personal history of urinary calculi: Secondary | ICD-10-CM | POA: Insufficient documentation

## 2011-08-05 HISTORY — DX: Disorder of kidney and ureter, unspecified: N28.9

## 2011-08-05 LAB — URINALYSIS, ROUTINE W REFLEX MICROSCOPIC
Bilirubin Urine: NEGATIVE
Ketones, ur: NEGATIVE mg/dL
Nitrite: NEGATIVE
pH: 6 (ref 5.0–8.0)

## 2011-08-05 LAB — CBC
MCV: 94.1 fL (ref 78.0–100.0)
Platelets: 265 10*3/uL (ref 150–400)
RDW: 12.9 % (ref 11.5–15.5)
WBC: 8.6 10*3/uL (ref 4.0–10.5)

## 2011-08-05 LAB — COMPREHENSIVE METABOLIC PANEL
ALT: 17 U/L (ref 0–35)
AST: 16 U/L (ref 0–37)
CO2: 28 mEq/L (ref 19–32)
Calcium: 9.5 mg/dL (ref 8.4–10.5)
Sodium: 137 mEq/L (ref 135–145)
Total Protein: 7 g/dL (ref 6.0–8.3)

## 2011-08-05 LAB — DIFFERENTIAL
Basophils Absolute: 0 10*3/uL (ref 0.0–0.1)
Eosinophils Absolute: 0.2 10*3/uL (ref 0.0–0.7)
Eosinophils Relative: 3 % (ref 0–5)
Lymphocytes Relative: 42 % (ref 12–46)
Neutrophils Relative %: 49 % (ref 43–77)

## 2011-08-05 LAB — URINE MICROSCOPIC-ADD ON

## 2011-08-05 LAB — URINE CULTURE: Culture  Setup Time: 201301141830

## 2011-08-05 MED ORDER — SODIUM CHLORIDE 0.9 % IV SOLN
Freq: Once | INTRAVENOUS | Status: AC
  Administered 2011-08-05: 15:00:00 via INTRAVENOUS

## 2011-08-05 MED ORDER — ONDANSETRON HCL 4 MG/2ML IJ SOLN
4.0000 mg | Freq: Once | INTRAMUSCULAR | Status: AC
  Administered 2011-08-05: 4 mg via INTRAVENOUS
  Filled 2011-08-05: qty 2

## 2011-08-05 MED ORDER — ONDANSETRON HCL 4 MG/2ML IJ SOLN
INTRAMUSCULAR | Status: AC
  Administered 2011-08-05: 17:00:00
  Filled 2011-08-05: qty 2

## 2011-08-05 MED ORDER — HYDROMORPHONE HCL PF 1 MG/ML IJ SOLN
INTRAMUSCULAR | Status: AC
  Administered 2011-08-05: 17:00:00
  Filled 2011-08-05: qty 1

## 2011-08-05 MED ORDER — CIPROFLOXACIN HCL 500 MG PO TABS: 500.0000 mg | ORAL_TABLET | Freq: Two times a day (BID) | ORAL | Status: DC

## 2011-08-05 MED ORDER — ONDANSETRON HCL 4 MG/2ML IJ SOLN: 4.0000 mg | Freq: Once | INTRAMUSCULAR | Status: DC

## 2011-08-05 MED ORDER — HYDROMORPHONE HCL PF 1 MG/ML IJ SOLN
INTRAMUSCULAR | Status: AC
  Filled 2011-08-05: qty 1

## 2011-08-05 MED ORDER — HYDROMORPHONE HCL PF 1 MG/ML IJ SOLN: 1.0000 mg | Freq: Once | INTRAMUSCULAR | Status: DC

## 2011-08-05 MED ORDER — OXYCODONE-ACETAMINOPHEN 5-325 MG PO TABS: 1.0000 | ORAL_TABLET | Freq: Four times a day (QID) | ORAL | Status: AC | PRN

## 2011-08-05 MED ORDER — PROMETHAZINE HCL 25 MG PO TABS: 25.0000 mg | ORAL_TABLET | Freq: Four times a day (QID) | ORAL | Status: DC | PRN

## 2011-08-05 MED ORDER — CIPROFLOXACIN HCL 500 MG PO TABS: 500.0000 mg | ORAL_TABLET | Freq: Two times a day (BID) | ORAL | Status: AC

## 2011-08-05 MED ORDER — HYDROMORPHONE HCL PF 1 MG/ML IJ SOLN
1.0000 mg | Freq: Once | INTRAMUSCULAR | Status: AC
  Administered 2011-08-05: 1 mg via INTRAVENOUS

## 2011-08-05 MED ORDER — HYDROMORPHONE HCL PF 1 MG/ML IJ SOLN
1.0000 mg | Freq: Once | INTRAMUSCULAR | Status: AC
  Administered 2011-08-05: 1 mg via INTRAVENOUS
  Filled 2011-08-05: qty 1

## 2011-08-05 NOTE — ED Notes (Signed)
Says she is ready to go home, Mother at bedside. Says they have been here too long.  Dr Estell Harpin in to speak with pt and mother..  Pt had been requesting IV to be removed

## 2011-08-05 NOTE — ED Notes (Signed)
Pain r t flank for 2 days, vomited last pm, Has hx of kidney stones and pt feels that she is having this problem again.

## 2011-08-05 NOTE — ED Notes (Signed)
C/o right flank pain x 3 days; reports hx of kidney stones; c/o n/v with pain; last vomited last night.

## 2011-08-05 NOTE — ED Provider Notes (Cosign Needed)
History     CSN: [id number]  Arrival date & time 08/05/11  1305   First MD Initiated Contact with Patient 08/05/11 1438      Chief Complaint  Patient presents with  . Flank Pain    right flank    (Consider location/radiation/quality/duration/timing/severity/associated sxs/prior treatment) Patient is a 41 y.o. female presenting with flank pain. The history is provided by the patient (patient complains of severe right flank pain. Patient states she has a history of kidney stones and feels like she has another kidney stone. Patient also complains of nausea and vomiting.).  Flank Pain This is a recurrent problem. The current episode started yesterday. The problem occurs constantly. The problem has not changed since onset.Associated symptoms include abdominal pain. Pertinent negatives include no chest pain and no headaches. The symptoms are aggravated by nothing. The symptoms are relieved by nothing. She has tried nothing for the symptoms. The treatment provided no relief.    Past Medical History  Diagnosis Date  . Hypertension   . Depressed   . Cancer   . Renal disorder     kidney stones    Past Surgical History  Procedure Date  . Appendectomy     No family history on file.  History  Substance Use Topics  . Smoking status: Current Everyday Smoker -- 1.0 packs/day    Types: Cigarettes  . Smokeless tobacco: Not on file  . Alcohol Use: No    OB History    Grav Para Term Preterm Abortions TAB SAB Ect Mult Living                  Review of Systems  Constitutional: Negative for fatigue.  HENT: Negative for congestion, sinus pressure and ear discharge.   Eyes: Negative for discharge.  Respiratory: Negative for cough.   Cardiovascular: Negative for chest pain.  Gastrointestinal: Positive for vomiting and abdominal pain. Negative for diarrhea.  Genitourinary: Positive for flank pain. Negative for frequency and hematuria.  Musculoskeletal: Negative for back pain.  Skin:  Negative for rash.  Neurological: Negative for seizures and headaches.  Hematological: Negative.   Psychiatric/Behavioral: Negative for hallucinations.    Allergies  Ketorolac tromethamine; Propoxyphene n-acetaminophen; Propoxyphene napsylate; and Tramadol hcl  Home Medications   Current Outpatient Rx  Name Route Sig Dispense Refill  . CLONAZEPAM 0.5 MG PO TABS Oral Take 0.5 mg by mouth 4 (four) times daily as needed. For anxiety    . IBUPROFEN 200 MG PO TABS Oral Take 800 mg by mouth every 6 (six) hours as needed. For pain    . LISINOPRIL-HYDROCHLOROTHIAZIDE 20-12.5 MG PO TABS Oral Take 1 tablet by mouth daily.      . ADULT MULTIVITAMIN W/MINERALS CH Oral Take 1 tablet by mouth daily.    Marland Kitchen CIPROFLOXACIN HCL 500 MG PO TABS Oral Take 1 tablet (500 mg total) by mouth 2 (two) times daily. 14 tablet 0  . OXYCODONE-ACETAMINOPHEN 5-325 MG PO TABS Oral Take 1 tablet by mouth every 6 (six) hours as needed for pain. 20 tablet 0  . PROMETHAZINE HCL 25 MG PO TABS Oral Take 1 tablet (25 mg total) by mouth every 6 (six) hours as needed for nausea. 15 tablet 0    BP 147/97  Pulse 80  Temp(Src) 98.5 F (36.9 C) (Oral)  Resp 21  Ht 5\' 3"  (1.6 m)  Wt 174 lb (78.926 kg)  BMI 30.82 kg/m2  SpO2 99%  LMP 07/29/2011  Physical Exam  Constitutional: She is oriented to person,  place, and time. She appears well-developed. She appears distressed.  HENT:  Head: Normocephalic and atraumatic.  Eyes: Conjunctivae and EOM are normal. No scleral icterus.  Neck: Neck supple. No thyromegaly present.  Cardiovascular: Normal rate and regular rhythm.  Exam reveals no gallop and no friction rub.   No murmur heard. Pulmonary/Chest: No stridor. She has no wheezes. She has no rales. She exhibits no tenderness.  Abdominal: She exhibits no distension. There is no tenderness. There is no rebound.  Genitourinary:       Tender right flank  Musculoskeletal: Normal range of motion. She exhibits no edema.    Lymphadenopathy:    She has no cervical adenopathy.  Neurological: She is oriented to person, place, and time. Coordination normal.  Skin: No rash noted. No erythema.  Psychiatric: She has a normal mood and affect. Her behavior is normal.    ED Course  Procedures (including critical care time)  Labs Reviewed  URINALYSIS, ROUTINE W REFLEX MICROSCOPIC - Abnormal; Notable for the following:    APPearance HAZY (*)    Specific Gravity, Urine >1.030 (*)    Hgb urine dipstick LARGE (*)    All other components within normal limits  URINE MICROSCOPIC-ADD ON - Abnormal; Notable for the following:    Squamous Epithelial / LPF MANY (*)    Bacteria, UA FEW (*)    All other components within normal limits  CBC - Abnormal; Notable for the following:    RBC 3.73 (*)    Hemoglobin 11.8 (*)    HCT 35.1 (*)    All other components within normal limits  COMPREHENSIVE METABOLIC PANEL - Abnormal; Notable for the following:    Alkaline Phosphatase 37 (*)    Total Bilirubin 0.2 (*)    All other components within normal limits  POCT PREGNANCY, URINE  DIFFERENTIAL  POCT PREGNANCY, URINE  PREGNANCY, URINE  URINE CULTURE   Ct Abdomen Pelvis Wo Contrast  08/05/2011  *RADIOLOGY REPORT*  Clinical Data: Right-sided flank pain.  History of kidney stones.  CT ABDOMEN AND PELVIS WITHOUT CONTRAST  Technique:  Multidetector CT imaging of the abdomen and pelvis was performed following the standard protocol without intravenous contrast.  Comparison: 02/06/2010  Findings: Lung bases are clear.  No pleural or pericardial fluid. There is mild fatty change of the liver.  No focal lesions.  No calcified gallstones.  The spleen is normal.  The pancreas is normal.  There is a punctate benign-appearing calcification at the tip of the pancreatic tail, unchanged.  The adrenal glands are normal.  The kidneys are normal.  No cyst, mass, stone or hydronephrosis.  No stones seen along the course of either ureter. No stone in the  bladder.  There is mild atherosclerosis of the aorta but no aneurysm.  No retroperitoneal mass or adenopathy.  No free air.  No fluid or air.  No bowel pathology is seen. There has been previous appendectomy.  Uterus and adnexal regions appear normal.  No free fluid in the pelvis.  No significant bony finding.  IMPRESSION: No evidence of urinary tract stone disease.  This finding is similar to the study of 02/06/2010, also done for the same indication and also without evidence of urinary tract stone disease by CT.  Fatty changes of the liver again noted without evidence of focal lesion.  Premature atherosclerosis of the aorta.  Previous appendectomy.  Original Report Authenticated By: Thomasenia Sales, M.D.   Results for orders placed during the hospital encounter of 08/05/11  URINALYSIS, ROUTINE W REFLEX MICROSCOPIC      Component Value Range   Color, Urine YELLOW  YELLOW    APPearance HAZY (*) CLEAR    Specific Gravity, Urine >1.030 (*) 1.005 - 1.030    pH 6.0  5.0 - 8.0    Glucose, UA NEGATIVE  NEGATIVE (mg/dL)   Hgb urine dipstick LARGE (*) NEGATIVE    Bilirubin Urine NEGATIVE  NEGATIVE    Ketones, ur NEGATIVE  NEGATIVE (mg/dL)   Protein, ur NEGATIVE  NEGATIVE (mg/dL)   Urobilinogen, UA 0.2  0.0 - 1.0 (mg/dL)   Nitrite NEGATIVE  NEGATIVE    Leukocytes, UA NEGATIVE  NEGATIVE   POCT PREGNANCY, URINE      Component Value Range   Preg Test, Ur NEGATIVE    URINE MICROSCOPIC-ADD ON      Component Value Range   Squamous Epithelial / LPF MANY (*) RARE    RBC / HPF TOO NUMEROUS TO COUNT  <3 (RBC/hpf)   Bacteria, UA FEW (*) RARE   CBC      Component Value Range   WBC 8.6  4.0 - 10.5 (K/uL)   RBC 3.73 (*) 3.87 - 5.11 (MIL/uL)   Hemoglobin 11.8 (*) 12.0 - 15.0 (g/dL)   HCT 16.1 (*) 09.6 - 46.0 (%)   MCV 94.1  78.0 - 100.0 (fL)   MCH 31.6  26.0 - 34.0 (pg)   MCHC 33.6  30.0 - 36.0 (g/dL)   RDW 04.5  40.9 - 81.1 (%)   Platelets 265  150 - 400 (K/uL)  DIFFERENTIAL      Component Value Range     Neutrophils Relative 49  43 - 77 (%)   Neutro Abs 4.2  1.7 - 7.7 (K/uL)   Lymphocytes Relative 42  12 - 46 (%)   Lymphs Abs 3.6  0.7 - 4.0 (K/uL)   Monocytes Relative 6  3 - 12 (%)   Monocytes Absolute 0.5  0.1 - 1.0 (K/uL)   Eosinophils Relative 3  0 - 5 (%)   Eosinophils Absolute 0.2  0.0 - 0.7 (K/uL)   Basophils Relative 0  0 - 1 (%)   Basophils Absolute 0.0  0.0 - 0.1 (K/uL)  COMPREHENSIVE METABOLIC PANEL      Component Value Range   Sodium 137  135 - 145 (mEq/L)   Potassium 4.1  3.5 - 5.1 (mEq/L)   Chloride 103  96 - 112 (mEq/L)   CO2 28  19 - 32 (mEq/L)   Glucose, Bld 93  70 - 99 (mg/dL)   BUN 9  6 - 23 (mg/dL)   Creatinine, Ser 9.14  0.50 - 1.10 (mg/dL)   Calcium 9.5  8.4 - 78.2 (mg/dL)   Total Protein 7.0  6.0 - 8.3 (g/dL)   Albumin 3.7  3.5 - 5.2 (g/dL)   AST 16  0 - 37 (U/L)   ALT 17  0 - 35 (U/L)   Alkaline Phosphatase 37 (*) 39 - 117 (U/L)   Total Bilirubin 0.2 (*) 0.3 - 1.2 (mg/dL)   GFR calc non Af Amer >90  >90 (mL/min)   GFR calc Af Amer >90  >90 (mL/min)   Ct Abdomen Pelvis Wo Contrast  08/05/2011  *RADIOLOGY REPORT*  Clinical Data: Right-sided flank pain.  History of kidney stones.  CT ABDOMEN AND PELVIS WITHOUT CONTRAST  Technique:  Multidetector CT imaging of the abdomen and pelvis was performed following the standard protocol without intravenous contrast.  Comparison: 02/06/2010  Findings: Lung bases  are clear.  No pleural or pericardial fluid. There is mild fatty change of the liver.  No focal lesions.  No calcified gallstones.  The spleen is normal.  The pancreas is normal.  There is a punctate benign-appearing calcification at the tip of the pancreatic tail, unchanged.  The adrenal glands are normal.  The kidneys are normal.  No cyst, mass, stone or hydronephrosis.  No stones seen along the course of either ureter. No stone in the bladder.  There is mild atherosclerosis of the aorta but no aneurysm.  No retroperitoneal mass or adenopathy.  No free air.  No  fluid or air.  No bowel pathology is seen. There has been previous appendectomy.  Uterus and adnexal regions appear normal.  No free fluid in the pelvis.  No significant bony finding.  IMPRESSION: No evidence of urinary tract stone disease.  This finding is similar to the study of 02/06/2010, also done for the same indication and also without evidence of urinary tract stone disease by CT.  Fatty changes of the liver again noted without evidence of focal lesion.  Premature atherosclerosis of the aorta.  Previous appendectomy.  Original Report Authenticated By: Thomasenia Sales, M.D.      1. Hematuria       MDM  Hematuria,  Uti? Kidney stone?        Benny Lennert, MD 08/05/11 1725

## 2011-09-22 ENCOUNTER — Encounter (HOSPITAL_COMMUNITY): Payer: Self-pay | Admitting: *Deleted

## 2011-09-22 DIAGNOSIS — F3289 Other specified depressive episodes: Secondary | ICD-10-CM | POA: Insufficient documentation

## 2011-09-22 DIAGNOSIS — Z79899 Other long term (current) drug therapy: Secondary | ICD-10-CM | POA: Insufficient documentation

## 2011-09-22 DIAGNOSIS — Z87442 Personal history of urinary calculi: Secondary | ICD-10-CM | POA: Insufficient documentation

## 2011-09-22 DIAGNOSIS — R51 Headache: Secondary | ICD-10-CM | POA: Insufficient documentation

## 2011-09-22 DIAGNOSIS — R21 Rash and other nonspecific skin eruption: Secondary | ICD-10-CM | POA: Insufficient documentation

## 2011-09-22 DIAGNOSIS — F17200 Nicotine dependence, unspecified, uncomplicated: Secondary | ICD-10-CM | POA: Insufficient documentation

## 2011-09-22 DIAGNOSIS — I10 Essential (primary) hypertension: Secondary | ICD-10-CM | POA: Insufficient documentation

## 2011-09-22 DIAGNOSIS — E669 Obesity, unspecified: Secondary | ICD-10-CM | POA: Insufficient documentation

## 2011-09-22 DIAGNOSIS — F329 Major depressive disorder, single episode, unspecified: Secondary | ICD-10-CM | POA: Insufficient documentation

## 2011-09-22 NOTE — ED Notes (Signed)
Pt reports she began to have a bad headache approx 1 hr ago, reports she took her b/p and got a "high" reading, pt reports she did take a klonopin pta

## 2011-09-23 ENCOUNTER — Other Ambulatory Visit: Payer: Self-pay

## 2011-09-23 ENCOUNTER — Emergency Department (HOSPITAL_COMMUNITY)
Admission: EM | Admit: 2011-09-23 | Discharge: 2011-09-23 | Discharge status: Against Medical Advice | Payer: Self-pay | Attending: Emergency Medicine | Admitting: Emergency Medicine

## 2011-09-23 DIAGNOSIS — R51 Headache: Secondary | ICD-10-CM

## 2011-09-23 NOTE — ED Notes (Signed)
Patient and her spouse walked out or room, RN asked where they were going and the spouse replied "to another hospital -- the doctor said he would give her some Motrin - but I've got that at home"  We are just going to another hospital.  Did not wait for any additional discussion with MD.  Left

## 2011-09-23 NOTE — Discharge Instructions (Signed)
Headaches, Frequently Asked Questions MIGRAINE HEADACHES Q: What is migraine? What causes it? How can I treat it? A: Generally, migraine headaches begin as a dull ache. Then they develop into a constant, throbbing, and pulsating pain. You may experience pain at the temples. You may experience pain at the front or back of one or both sides of the head. The pain is usually accompanied by a combination of:  Nausea.   Vomiting.   Sensitivity to light and noise.  Some people (about 15%) experience an aura (see below) before an attack. The cause of migraine is believed to be chemical reactions in the brain. Treatment for migraine may include over-the-counter or prescription medications. It may also include self-help techniques. These include relaxation training and biofeedback.  Q: What is an aura? A: About 15% of people with migraine get an "aura". This is a sign of neurological symptoms that occur before a migraine headache. You may see wavy or jagged lines, dots, or flashing lights. You might experience tunnel vision or blind spots in one or both eyes. The aura can include visual or auditory hallucinations (something imagined). It may include disruptions in smell (such as strange odors), taste or touch. Other symptoms include:  Numbness.   A "pins and needles" sensation.   Difficulty in recalling or speaking the correct word.  These neurological events may last as long as 60 minutes. These symptoms will fade as the headache begins. Q: What is a trigger? A: Certain physical or environmental factors can lead to or "trigger" a migraine. These include:  Foods.   Hormonal changes.   Weather.   Stress.  It is important to remember that triggers are different for everyone. To help prevent migraine attacks, you need to figure out which triggers affect you. Keep a headache diary. This is a good way to track triggers. The diary will help you talk to your healthcare professional about your  condition. Q: Does weather affect migraines? A: Bright sunshine, hot, humid conditions, and drastic changes in barometric pressure may lead to, or "trigger," a migraine attack in some people. But studies have shown that weather does not act as a trigger for everyone with migraines. Q: What is the link between migraine and hormones? A: Hormones start and regulate many of your body's functions. Hormones keep your body in balance within a constantly changing environment. The levels of hormones in your body are unbalanced at times. Examples are during menstruation, pregnancy, or menopause. That can lead to a migraine attack. In fact, about three quarters of all women with migraine report that their attacks are related to the menstrual cycle.  Q: Is there an increased risk of stroke for migraine sufferers? A: The likelihood of a migraine attack causing a stroke is very remote. That is not to say that migraine sufferers cannot have a stroke associated with their migraines. In persons under age 40, the most common associated factor for stroke is migraine headache. But over the course of a person's normal life span, the occurrence of migraine headache may actually be associated with a reduced risk of dying from cerebrovascular disease due to stroke.  Q: What are acute medications for migraine? A: Acute medications are used to treat the pain of the headache after it has started. Examples over-the-counter medications, NSAIDs, ergots, and triptans.  Q: What are the triptans? A: Triptans are the newest class of abortive medications. They are specifically targeted to treat migraine. Triptans are vasoconstrictors. They moderate some chemical reactions in the brain.   The triptans work on receptors in your brain. Triptans help to restore the balance of a neurotransmitter called serotonin. Fluctuations in levels of serotonin are thought to be a main cause of migraine.  Q: Are over-the-counter medications for migraine  effective? A: Over-the-counter, or "OTC," medications may be effective in relieving mild to moderate pain and associated symptoms of migraine. But you should see your caregiver before beginning any treatment regimen for migraine.  Q: What are preventive medications for migraine? A: Preventive medications for migraine are sometimes referred to as "prophylactic" treatments. They are used to reduce the frequency, severity, and length of migraine attacks. Examples of preventive medications include antiepileptic medications, antidepressants, beta-blockers, calcium channel blockers, and NSAIDs (nonsteroidal anti-inflammatory drugs). Q: Why are anticonvulsants used to treat migraine? A: During the past few years, there has been an increased interest in antiepileptic drugs for the prevention of migraine. They are sometimes referred to as "anticonvulsants". Both epilepsy and migraine may be caused by similar reactions in the brain.  Q: Why are antidepressants used to treat migraine? A: Antidepressants are typically used to treat people with depression. They may reduce migraine frequency by regulating chemical levels, such as serotonin, in the brain.  Q: What alternative therapies are used to treat migraine? A: The term "alternative therapies" is often used to describe treatments considered outside the scope of conventional Western medicine. Examples of alternative therapy include acupuncture, acupressure, and yoga. Another common alternative treatment is herbal therapy. Some herbs are believed to relieve headache pain. Always discuss alternative therapies with your caregiver before proceeding. Some herbal products contain arsenic and other toxins. TENSION HEADACHES Q: What is a tension-type headache? What causes it? How can I treat it? A: Tension-type headaches occur randomly. They are often the result of temporary stress, anxiety, fatigue, or anger. Symptoms include soreness in your temples, a tightening  band-like sensation around your head (a "vice-like" ache). Symptoms can also include a pulling feeling, pressure sensations, and contracting head and neck muscles. The headache begins in your forehead, temples, or the back of your head and neck. Treatment for tension-type headache may include over-the-counter or prescription medications. Treatment may also include self-help techniques such as relaxation training and biofeedback. CLUSTER HEADACHES Q: What is a cluster headache? What causes it? How can I treat it? A: Cluster headache gets its name because the attacks come in groups. The pain arrives with little, if any, warning. It is usually on one side of the head. A tearing or bloodshot eye and a runny nose on the same side of the headache may also accompany the pain. Cluster headaches are believed to be caused by chemical reactions in the brain. They have been described as the most severe and intense of any headache type. Treatment for cluster headache includes prescription medication and oxygen. SINUS HEADACHES Q: What is a sinus headache? What causes it? How can I treat it? A: When a cavity in the bones of the face and skull (a sinus) becomes inflamed, the inflammation will cause localized pain. This condition is usually the result of an allergic reaction, a tumor, or an infection. If your headache is caused by a sinus blockage, such as an infection, you will probably have a fever. An x-ray will confirm a sinus blockage. Your caregiver's treatment might include antibiotics for the infection, as well as antihistamines or decongestants.  REBOUND HEADACHES Q: What is a rebound headache? What causes it? How can I treat it? A: A pattern of taking acute headache medications too   often can lead to a condition known as "rebound headache." A pattern of taking too much headache medication includes taking it more than 2 days per week or in excessive amounts. That means more than the label or a caregiver advises.  With rebound headaches, your medications not only stop relieving pain, they actually begin to cause headaches. Doctors treat rebound headache by tapering the medication that is being overused. Sometimes your caregiver will gradually substitute a different type of treatment or medication. Stopping may be a challenge. Regularly overusing a medication increases the potential for serious side effects. Consult a caregiver if you regularly use headache medications more than 2 days per week or more than the label advises. ADDITIONAL QUESTIONS AND ANSWERS Q: What is biofeedback? A: Biofeedback is a self-help treatment. Biofeedback uses special equipment to monitor your body's involuntary physical responses. Biofeedback monitors:  Breathing.   Pulse.   Heart rate.   Temperature.   Muscle tension.   Brain activity.  Biofeedback helps you refine and perfect your relaxation exercises. You learn to control the physical responses that are related to stress. Once the technique has been mastered, you do not need the equipment any more. Q: Are headaches hereditary? A: Four out of five (80%) of people that suffer report a family history of migraine. Scientists are not sure if this is genetic or a family predisposition. Despite the uncertainty, a child has a 50% chance of having migraine if one parent suffers. The child has a 75% chance if both parents suffer.  Q: Can children get headaches? A: By the time they reach high school, most young people have experienced some type of headache. Many safe and effective approaches or medications can prevent a headache from occurring or stop it after it has begun.  Q: What type of doctor should I see to diagnose and treat my headache? A: Start with your primary caregiver. Discuss his or her experience and approach to headaches. Discuss methods of classification, diagnosis, and treatment. Your caregiver may decide to recommend you to a headache specialist, depending upon  your symptoms or other physical conditions. Having diabetes, allergies, etc., may require a more comprehensive and inclusive approach to your headache. The National Headache Foundation will provide, upon request, a list of NHF physician members in your state. Document Released: 09/28/2003 Document Revised: 06/27/2011 Document Reviewed: 03/07/2008 ExitCare Patient Information 2012 [employer].Headache, General, Unknown Cause The specific cause of your headache may not have been found today. There are many causes and types of headache. A few common ones are:  Tension headache.   Migraine.   Infections (examples: dental and sinus infections).   Bone and/or joint problems in the neck or jaw.   Depression.   Eye problems.  These headaches are not life threatening.  Headaches can sometimes be diagnosed by a patient history and a physical exam. Sometimes, lab and imaging studies (such as x-ray and/or CT scan) are used to rule out more serious problems. In some cases, a spinal tap (lumbar puncture) may be requested. There are many times when your exam and tests may be normal on the first visit even when there is a serious problem causing your headaches. Because of that, it is very important to follow up with your doctor or local clinic for further evaluation. FINDING OUT THE RESULTS OF TESTS  If a radiology test was performed, a radiologist will review your results.   You will be contacted by the emergency department or your physician if any test results   require a change in your treatment plan.   Not all test results may be available during your visit. If your test results are not back during the visit, make an appointment with your caregiver to find out the results. Do not assume everything is normal if you have not heard from your caregiver or the medical facility. It is important for you to follow up on all of your test results.  HOME CARE INSTRUCTIONS   Keep follow-up appointments with  your caregiver, or any specialist referral.   Only take over-the-counter or prescription medicines for pain, discomfort, or fever as directed by your caregiver.   Biofeedback, massage, or other relaxation techniques may be helpful.   Ice packs or heat applied to the head and neck can be used. Do this three to four times per day, or as needed.   Call your doctor if you have any questions or concerns.   If you smoke, you should quit.  SEEK MEDICAL CARE IF:   You develop problems with medications prescribed.   You do not respond to or obtain relief from medications.   You have a change from the usual headache.   You develop nausea or vomiting.  SEEK IMMEDIATE MEDICAL CARE IF:   If your headache becomes severe.   You have an unexplained oral temperature above 102 F (38.9 C), or as your caregiver suggests.   You have a stiff neck.   You have loss of vision.   You have muscular weakness.   You have loss of muscular control.   You develop severe symptoms different from your first symptoms.   You start losing your balance or have trouble walking.   You feel faint or pass out.  MAKE SURE YOU:   Understand these instructions.   Will watch your condition.   Will get help right away if you are not doing well or get worse.  Document Released: 07/08/2005 Document Revised: 06/27/2011 Document Reviewed: 02/25/2008 ExitCare Patient Information 2012 [employer].  RESOURCE GUIDE  Dental Problems  Patients with Medicaid: [facility]                     [facility] [address]                                           [address] Phone:  [phone]                                                  Phone:  [phone]  If unable to pay or uninsured, contact:  [facility] or [facility] to become qualified for the adult dental clinic.  Chronic Pain Problems Contact [facility]  [phone] Patients need to  be referred by their primary care doctor.  Insufficient Money for Medicine Contact [employer]:  call "211" or [facility] 271-5999.  No Primary Care Doctor Call [facility]  832-8000 Other agencies that provide inexpensive medical care    [facility]  832-8035    [facility]  832-7272    [facility]  [phone]    Women[facility] Clinic  832-4777    [facility]    [phone]    [facility]  272-1050  Psychological Services [facility]  832-9600 [facility]  [phone] [facility]   800 853-5163 (emergency services 641-4993)  Substance Abuse Resources [facility]  336-882-2125 [facility] 336-784-9470 [facility] 336-285-9073 [facility] 336-845-3988 Residential & Outpatient Substance Abuse Program  800-659-3381  Abuse/Neglect [place] Child Abuse Hotline (336) 641-3795 [place] Child Abuse Hotline 800-378-5315 (After Hours)  Emergency Shelter [facility] (336) 271-5985  [facility] (336) 275-9566 [facility] (704) 372-4663  MRSA Hotline #:   832-7006    [place] [facility]  [facility] [place]     [employer]                          [facility] [address] [place]                       [address]      [address]  [place]                                                [place]                            [place] Phone:  [phone]                                   Phone:  [phone]                 Phone:  [phone]  [facility] Phone:  [phone]  [place] Child Abuse Hotline (336) 342-1394 (336) 342-3537 (After Hours)   

## 2011-09-23 NOTE — ED Provider Notes (Signed)
History    41 year old female with headache. Gradual onset about an hour prior to arrival. Describes the headache is diffuse and achy. No appreciable exacerbating relieving factors. No neck pain or neck stiffness. No fevers or chills. Denies trauma. Denies use of blood thinning medication. Onset of headache was gradual. Patient cannot remember what she is doing an onset. No acute visual changes. No numbness, tingling or loss of strength. No contacts with similar. Denies history of cerebral aneurysm that she is aware of No nausea or vomiting. Patient states that she took her blood pressure at home and it was high but cannot remember the exact numbers. This concerned her so she came to the emergency room for further evaluation.  CSN: [id number]  Arrival date & time 09/22/11  2341   First MD Initiated Contact with Patient 09/23/11 0014      Chief Complaint  Patient presents with  . Headache    (Consider location/radiation/quality/duration/timing/severity/associated sxs/prior treatment) HPI  Past Medical History  Diagnosis Date  . Hypertension   . Depressed   . Cancer   . Renal disorder     kidney stones    Past Surgical History  Procedure Date  . Appendectomy     No family history on file.  History  Substance Use Topics  . Smoking status: Current Everyday Smoker -- 1.0 packs/day    Types: Cigarettes  . Smokeless tobacco: Not on file  . Alcohol Use: No    OB History    Grav Para Term Preterm Abortions TAB SAB Ect Mult Living                  Review of Systems   Review of symptoms negative unless otherwise noted in HPI.   Allergies  Ketorolac tromethamine; Propoxyphene n-acetaminophen; Propoxyphene napsylate; and Tramadol hcl  Home Medications   Current Outpatient Rx  Name Route Sig Dispense Refill  . CLONAZEPAM 0.5 MG PO TABS Oral Take 0.5 mg by mouth 4 (four) times daily as needed. For anxiety    . IBUPROFEN 200 MG PO TABS Oral Take 800 mg by mouth every 6  (six) hours as needed. For pain    . LISINOPRIL-HYDROCHLOROTHIAZIDE 20-12.5 MG PO TABS Oral Take 1 tablet by mouth daily.      . ADULT MULTIVITAMIN W/MINERALS CH Oral Take 1 tablet by mouth daily.      BP 145/92  Pulse 92  Temp(Src) 98 F (36.7 C) (Oral)  Resp 18  Ht 5\' 3"  (1.6 m)  Wt 190 lb (86.183 kg)  BMI 33.66 kg/m2  SpO2 99%  LMP 09/11/2011  Physical Exam  Nursing note and vitals reviewed. Constitutional: She is oriented to person, place, and time. She appears well-developed and well-nourished. No distress.       Laying in bed. No acute distress. Obese  HENT:  Head: Normocephalic and atraumatic.  Right Ear: External ear normal.  Left Ear: External ear normal.  Mouth/Throat: Oropharynx is clear and moist.  Eyes: Conjunctivae are normal. Pupils are equal, round, and reactive to light. Right eye exhibits no discharge. Left eye exhibits no discharge.  Neck: Neck supple.  Cardiovascular: Normal rate, regular rhythm and normal heart sounds.  Exam reveals no gallop and no friction rub.   No murmur heard. Pulmonary/Chest: Effort normal and breath sounds normal. No respiratory distress.  Abdominal: Soft. She exhibits no distension. There is no tenderness.  Musculoskeletal: She exhibits no edema and no tenderness.  Neurological: She is alert and oriented to person, place, and  time. No cranial nerve deficit. She exhibits normal muscle tone. Coordination normal.       Good finger nose testing bilaterally. Steady gait.  Skin: Skin is warm and dry. Rash noted. She is not diaphoretic.       Scaly lesions bilateral upper/ lower extremities consistent with the provided history of psoriasis.  Psychiatric: She has a normal mood and affect. Her behavior is normal. Thought content normal.    ED Course  Procedures (including critical care time)  Labs Reviewed - No data to display No results found.   1. Headache       MDM  41 year old female with headache. Explain plan to treat  symptoms with Reglan, Benadryl and ibuprofen. Patient states that she can take ibuprofen at home. Will not stay for treatment and further evaluation. Is leaving AGAINST MEDICAL ADVICE. Patient has medical decision making ability. She understands the risk of bleeding including worsening symptoms and possibly death. Understands benefits of staying. Return precautions discussed. Encouraged to come back if she would change her mind.        Raeford Razor, MD 09/23/11 [phone]

## 2011-09-23 NOTE — ED Notes (Signed)
Questioned patient about red rash noted on arms - states psoriasis.  Not new.

## 2012-06-09 ENCOUNTER — Encounter (HOSPITAL_COMMUNITY): Payer: Self-pay | Admitting: *Deleted

## 2012-06-09 ENCOUNTER — Emergency Department (HOSPITAL_COMMUNITY)
Admission: EM | Admit: 2012-06-09 | Discharge: 2012-06-10 | Discharge status: Against Medical Advice | Payer: Self-pay | Attending: Emergency Medicine | Admitting: Emergency Medicine

## 2012-06-09 ENCOUNTER — Emergency Department (HOSPITAL_COMMUNITY): Payer: Self-pay

## 2012-06-09 DIAGNOSIS — I10 Essential (primary) hypertension: Secondary | ICD-10-CM | POA: Insufficient documentation

## 2012-06-09 DIAGNOSIS — F3289 Other specified depressive episodes: Secondary | ICD-10-CM | POA: Insufficient documentation

## 2012-06-09 DIAGNOSIS — M79642 Pain in left hand: Secondary | ICD-10-CM

## 2012-06-09 DIAGNOSIS — F1990 Other psychoactive substance use, unspecified, uncomplicated: Secondary | ICD-10-CM

## 2012-06-09 DIAGNOSIS — M79609 Pain in unspecified limb: Secondary | ICD-10-CM | POA: Insufficient documentation

## 2012-06-09 DIAGNOSIS — Z859 Personal history of malignant neoplasm, unspecified: Secondary | ICD-10-CM | POA: Insufficient documentation

## 2012-06-09 DIAGNOSIS — Z79899 Other long term (current) drug therapy: Secondary | ICD-10-CM | POA: Insufficient documentation

## 2012-06-09 DIAGNOSIS — F329 Major depressive disorder, single episode, unspecified: Secondary | ICD-10-CM | POA: Insufficient documentation

## 2012-06-09 DIAGNOSIS — Z87442 Personal history of urinary calculi: Secondary | ICD-10-CM | POA: Insufficient documentation

## 2012-06-09 DIAGNOSIS — F17200 Nicotine dependence, unspecified, uncomplicated: Secondary | ICD-10-CM | POA: Insufficient documentation

## 2012-06-09 DIAGNOSIS — F1910 Other psychoactive substance abuse, uncomplicated: Secondary | ICD-10-CM | POA: Insufficient documentation

## 2012-06-09 LAB — RAPID URINE DRUG SCREEN, HOSP PERFORMED
Cocaine: NOT DETECTED
Opiates: NOT DETECTED
Tetrahydrocannabinol: NOT DETECTED

## 2012-06-09 LAB — CBC WITH DIFFERENTIAL/PLATELET
Basophils Absolute: 0 10*3/uL (ref 0.0–0.1)
Eosinophils Relative: 1 % (ref 0–5)
Lymphocytes Relative: 37 % (ref 12–46)
Neutro Abs: 5.9 10*3/uL (ref 1.7–7.7)
Platelets: 304 10*3/uL (ref 150–400)
RDW: 14.5 % (ref 11.5–15.5)
WBC: 10.4 10*3/uL (ref 4.0–10.5)

## 2012-06-09 LAB — BASIC METABOLIC PANEL
CO2: 24 mEq/L (ref 19–32)
Calcium: 9.8 mg/dL (ref 8.4–10.5)
Chloride: 101 mEq/L (ref 96–112)
Sodium: 136 mEq/L (ref 135–145)

## 2012-06-09 MED ORDER — IBUPROFEN 800 MG PO TABS
800.0000 mg | ORAL_TABLET | Freq: Once | ORAL | Status: AC
  Administered 2012-06-09: 800 mg via ORAL
  Filled 2012-06-09: qty 1

## 2012-06-09 MED ORDER — IBUPROFEN 100 MG/5ML PO SUSP: 800.0000 mg | Freq: Once | ORAL | Status: DC

## 2012-06-09 NOTE — ED Notes (Signed)
Pain lt hand, Has been injecting iv meds, opana,  Difficulty moving hand

## 2012-06-09 NOTE — ED Provider Notes (Signed)
History     CSN: [id number]  Arrival date & time 06/09/12  2204   First MD Initiated Contact with Patient 06/09/12 2231      Chief Complaint  Patient presents with  . Hand Pain    (Consider location/radiation/quality/duration/timing/severity/associated sxs/prior treatment) HPI Comments: Pt states she has been shooting up "opana".  She is crushing and then "cooking" the po form and then injecting into the dorsum of the L hand.  She notes that the hand is now swollen and painful to move and touch. She also used the R AC fossa as an IV site.  She denies fever or chills.    No CP or SOB.  Also smoking crack.  Patient is a 41 y.o. female presenting with hand pain. The history is provided by the patient. No language interpreter was used.  Hand Pain This is a new problem. The problem occurs constantly. The problem has been gradually worsening. Pertinent negatives include no chest pain or fever. Exacerbated by: hand movement and palpation. She has tried nothing for the symptoms.    Past Medical History  Diagnosis Date  . Hypertension   . Depressed   . Cancer   . Renal disorder     kidney stones    Past Surgical History  Procedure Date  . Appendectomy     History reviewed. No pertinent family history.  History  Substance Use Topics  . Smoking status: Current Every Day Smoker -- 1.0 packs/day    Types: Cigarettes  . Smokeless tobacco: Not on file  . Alcohol Use: No    OB History    Grav Para Term Preterm Abortions TAB SAB Ect Mult Living                  Review of Systems  Constitutional: Negative for fever.  Respiratory: Negative for shortness of breath.   Cardiovascular: Negative for chest pain.  Musculoskeletal:       Hand pain   All other systems reviewed and are negative.    Allergies  Ketorolac tromethamine; Propoxyphene napsylate; and Tramadol hcl  Home Medications   Current Outpatient Rx  Name  Route  Sig  Dispense  Refill  . CLONAZEPAM 1 MG PO  TABS   Oral   Take 1 mg by mouth 3 (three) times daily as needed. For anxiety         . LISINOPRIL-HYDROCHLOROTHIAZIDE 20-12.5 MG PO TABS   Oral   Take 1 tablet by mouth daily.           . ADULT MULTIVITAMIN W/MINERALS CH   Oral   Take 1 tablet by mouth daily.           BP 162/114  Pulse 93  Temp 98.3 F (36.8 C) (Oral)  Resp 20  Ht 5\' 2"  (1.575 m)  Wt 180 lb (81.647 kg)  BMI 32.92 kg/m2  SpO2 100%  LMP 05/26/2012  Physical Exam  Nursing note and vitals reviewed. Constitutional: She is oriented to person, place, and time. She appears well-developed and well-nourished. No distress.  HENT:  Head: Normocephalic and atraumatic.  Eyes: EOM are normal.  Neck: Normal range of motion.  Cardiovascular: Normal rate and regular rhythm.   Pulmonary/Chest: Effort normal and breath sounds normal. No respiratory distress. She has no wheezes.  Abdominal: Soft. She exhibits no distension. There is no tenderness.  Musculoskeletal: She exhibits tenderness.       Left hand: She exhibits decreased range of motion, tenderness and swelling. She exhibits  no bony tenderness, normal capillary refill, no deformity and no laceration. normal sensation noted. Normal strength noted.       Hands: Neurological: She is alert and oriented to person, place, and time.  Skin: Skin is warm and dry.  Psychiatric: She has a normal mood and affect. Judgment normal.    ED Course  Procedures (including critical care time)  Labs Reviewed  BASIC METABOLIC PANEL - Abnormal; Notable for the following:    Potassium 3.4 (*)     Creatinine, Ser 1.24 (*)     GFR calc non Af Amer 53 (*)     GFR calc Af Amer 62 (*)     All other components within normal limits  CBC WITH DIFFERENTIAL  URINE RAPID DRUG SCREEN (HOSP PERFORMED)   Dg Hand Complete Left  06/09/2012  *RADIOLOGY REPORT*  Clinical Data: Left hand pain, swelling.  LEFT HAND - COMPLETE 3+ VIEW  Comparison: None.  Findings: Soft tissue swelling.  No  radiopaque foreign body.  No acute fracture or dislocation or aggressive osseous lesion.  IMPRESSION: Soft tissue swelling without acute osseous abnormality.  MRI has increased sensitivity for osteomyelitis if clinical concern persists.   Original Report Authenticated By: Jearld Lesch, M.D.    Pt left AMA before labs and hand x-ray had resulted. Creatinine was noted elevated at 1.24 vs .60 on previous visits and GFR around 50 vs >90 on prev visits.  1. Left hand pain   2. IV drug user       MDM  left AMA        Evalina Field, PA 06/09/12 2352  Evalina Field, PA 06/10/12 4098

## 2012-06-09 NOTE — ED Notes (Signed)
Pt stated that she was leaving, refused to sign AMA form and was not receptive to discussion of risks of leaving AMA.  States "I'm leaving before I go off."

## 2012-06-09 NOTE — ED Notes (Signed)
Pt reporting she was using IV drugs, and "must have hit a nerve or something" in left hand.  Reports use was about 4 days ago. States that hand has gotten better, but continues to be sore and swollen.  Ice pack applied.

## 2012-06-12 NOTE — ED Provider Notes (Signed)
Medical screening examination/treatment/procedure(s) were performed by non-physician practitioner and as supervising physician I was immediately available for consultation/collaboration.   Dione Booze, MD 06/12/12 1256

## 2012-07-18 ENCOUNTER — Encounter (HOSPITAL_COMMUNITY): Payer: Self-pay

## 2012-07-18 ENCOUNTER — Emergency Department (HOSPITAL_COMMUNITY)
Admission: EM | Admit: 2012-07-18 | Discharge: 2012-07-18 | Disposition: A | Discharge status: Home / Self Care | Payer: Self-pay | Attending: Emergency Medicine | Admitting: Emergency Medicine

## 2012-07-18 DIAGNOSIS — J111 Influenza due to unidentified influenza virus with other respiratory manifestations: Secondary | ICD-10-CM | POA: Insufficient documentation

## 2012-07-18 DIAGNOSIS — F329 Major depressive disorder, single episode, unspecified: Secondary | ICD-10-CM | POA: Insufficient documentation

## 2012-07-18 DIAGNOSIS — Z79899 Other long term (current) drug therapy: Secondary | ICD-10-CM | POA: Insufficient documentation

## 2012-07-18 DIAGNOSIS — I10 Essential (primary) hypertension: Secondary | ICD-10-CM | POA: Insufficient documentation

## 2012-07-18 DIAGNOSIS — F3289 Other specified depressive episodes: Secondary | ICD-10-CM | POA: Insufficient documentation

## 2012-07-18 DIAGNOSIS — Z87442 Personal history of urinary calculi: Secondary | ICD-10-CM | POA: Insufficient documentation

## 2012-07-18 DIAGNOSIS — F10931 Alcohol use, unspecified with withdrawal delirium: Secondary | ICD-10-CM | POA: Insufficient documentation

## 2012-07-18 DIAGNOSIS — R1110 Vomiting, unspecified: Secondary | ICD-10-CM | POA: Insufficient documentation

## 2012-07-18 DIAGNOSIS — Z859 Personal history of malignant neoplasm, unspecified: Secondary | ICD-10-CM | POA: Insufficient documentation

## 2012-07-18 DIAGNOSIS — R197 Diarrhea, unspecified: Secondary | ICD-10-CM | POA: Insufficient documentation

## 2012-07-18 DIAGNOSIS — F1920 Other psychoactive substance dependence, uncomplicated: Secondary | ICD-10-CM | POA: Insufficient documentation

## 2012-07-18 DIAGNOSIS — F10231 Alcohol dependence with withdrawal delirium: Secondary | ICD-10-CM | POA: Insufficient documentation

## 2012-07-18 DIAGNOSIS — F17200 Nicotine dependence, unspecified, uncomplicated: Secondary | ICD-10-CM | POA: Insufficient documentation

## 2012-07-18 HISTORY — DX: Other psychoactive substance dependence, uncomplicated: F19.20

## 2012-07-18 LAB — URINALYSIS, ROUTINE W REFLEX MICROSCOPIC
Bilirubin Urine: NEGATIVE
Glucose, UA: NEGATIVE mg/dL
Hgb urine dipstick: NEGATIVE
Specific Gravity, Urine: 1.02 (ref 1.005–1.030)
Urobilinogen, UA: 0.2 mg/dL (ref 0.0–1.0)

## 2012-07-18 MED ORDER — HYDROMORPHONE HCL PF 1 MG/ML IJ SOLN
1.0000 mg | Freq: Once | INTRAMUSCULAR | Status: AC
  Administered 2012-07-18: 1 mg via INTRAVENOUS
  Filled 2012-07-18: qty 1

## 2012-07-18 MED ORDER — PROMETHAZINE HCL 25 MG PO TABS: 25.0000 mg | ORAL_TABLET | Freq: Four times a day (QID) | ORAL | Status: DC | PRN

## 2012-07-18 MED ORDER — ONDANSETRON HCL 4 MG/2ML IJ SOLN
4.0000 mg | Freq: Once | INTRAMUSCULAR | Status: AC
  Administered 2012-07-18: 4 mg via INTRAVENOUS
  Filled 2012-07-18: qty 2

## 2012-07-18 MED ORDER — HYDROCODONE-ACETAMINOPHEN 5-325 MG PO TABS: 1.0000 | ORAL_TABLET | Freq: Four times a day (QID) | ORAL | Status: DC | PRN

## 2012-07-18 MED ORDER — OSELTAMIVIR PHOSPHATE 75 MG PO CAPS: 75.0000 mg | ORAL_CAPSULE | Freq: Two times a day (BID) | ORAL | Status: DC

## 2012-07-18 MED ORDER — SODIUM CHLORIDE 0.9 % IV BOLUS (SEPSIS)
1000.0000 mL | Freq: Once | INTRAVENOUS | Status: AC
  Administered 2012-07-18: 1000 mL via INTRAVENOUS

## 2012-07-18 NOTE — ED Notes (Signed)
Pt was buying opana/crack cocaine off the streets, last dose 1 week ago, now not sure if she is having withdrawal symptoms or the flu. +nausea and vomiting, having tremors. Having back pain.  Pain radiates to left flank area. ?fever. +dairrhea.

## 2012-07-18 NOTE — ED Provider Notes (Addendum)
History     CSN: [id number]  Arrival date & time 07/18/12  1036   First MD Initiated Contact with Patient 07/18/12 1304      Chief Complaint  Patient presents with  . Fever  . Emesis  . Diarrhea  . Delirium Tremens (DTS)    (Consider location/radiation/quality/duration/timing/severity/associated sxs/prior treatment) Patient is a 41 y.o. female presenting with fever. The history is provided by the patient (pt complains of fever and aches). No language interpreter was used.  Fever Primary symptoms do not include fatigue, headaches, cough, abdominal pain, diarrhea or rash. The current episode started yesterday. This is a new problem. The problem has not changed since onset. The fever began yesterday. The fever has been unchanged since its onset. The maximum temperature recorded prior to her arrival was unknown. The temperature was taken by an oral thermometer.  Associated with: nothing. Risk factors: none.   Past Medical History  Diagnosis Date  . Hypertension   . Depressed   . Cancer   . Renal disorder     kidney stones  . Drug addiction     Past Surgical History  Procedure Date  . Appendectomy     No family history on file.  History  Substance Use Topics  . Smoking status: Current Every Day Smoker -- 1.0 packs/day    Types: Cigarettes  . Smokeless tobacco: Not on file  . Alcohol Use: No    OB History    Grav Para Term Preterm Abortions TAB SAB Ect Mult Living                  Review of Systems  Constitutional: Negative for fatigue.  HENT: Negative for congestion, sinus pressure and ear discharge.   Eyes: Negative for discharge.  Respiratory: Negative for cough.   Cardiovascular: Negative for chest pain.  Gastrointestinal: Negative for abdominal pain and diarrhea.  Genitourinary: Negative for frequency and hematuria.  Musculoskeletal: Negative for back pain.  Skin: Negative for rash.  Neurological: Negative for seizures and headaches.  Hematological:  Negative.   Psychiatric/Behavioral: Negative for hallucinations.    Allergies  Ketorolac tromethamine; Tramadol hcl; and Propoxyphene napsylate  Home Medications   Current Outpatient Rx  Name  Route  Sig  Dispense  Refill  . CLONAZEPAM 1 MG PO TABS   Oral   Take 1 mg by mouth 3 (three) times daily as needed. For anxiety         . LISINOPRIL-HYDROCHLOROTHIAZIDE 20-12.5 MG PO TABS   Oral   Take 1 tablet by mouth daily.           . ADULT MULTIVITAMIN W/MINERALS CH   Oral   Take 1 tablet by mouth daily.         Marland Kitchen HYDROCODONE-ACETAMINOPHEN 5-325 MG PO TABS   Oral   Take 1 tablet by mouth every 6 (six) hours as needed for pain.   10 tablet   0   . OSELTAMIVIR PHOSPHATE 75 MG PO CAPS   Oral   Take 1 capsule (75 mg total) by mouth every 12 (twelve) hours.   20 capsule   0   . PROMETHAZINE HCL 25 MG PO TABS   Oral   Take 1 tablet (25 mg total) by mouth every 6 (six) hours as needed for nausea.   15 tablet   0   . PROMETHAZINE HCL 25 MG PO TABS   Oral   Take 1 tablet (25 mg total) by mouth every 6 (six) hours as needed  for nausea.   15 tablet   0     BP 140/94  Pulse 110  Temp 97.7 F (36.5 C) (Oral)  Resp 20  SpO2 100%  LMP 07/04/2012  Physical Exam  Constitutional: She is oriented to person, place, and time. She appears well-developed.  HENT:  Head: Normocephalic and atraumatic.  Eyes: Conjunctivae normal and EOM are normal. No scleral icterus.  Neck: Neck supple. No thyromegaly present.  Cardiovascular: Normal rate and regular rhythm.  Exam reveals no gallop and no friction rub.   No murmur heard. Pulmonary/Chest: No stridor. She has no wheezes. She has no rales. She exhibits no tenderness.  Abdominal: She exhibits no distension. There is no tenderness. There is no rebound.  Musculoskeletal: Normal range of motion. She exhibits no edema.  Lymphadenopathy:    She has no cervical adenopathy.  Neurological: She is oriented to person, place, and time.  Coordination normal.  Skin: No rash noted. No erythema.  Psychiatric: She has a normal mood and affect. Her behavior is normal.    ED Course  Procedures (including critical care time)   Labs Reviewed  URINALYSIS, ROUTINE W REFLEX MICROSCOPIC   No results found.   1. Influenza       MDM          Benny Lennert, MD 07/18/12 1408  Benny Lennert, MD 07/18/12 2114

## 2012-07-18 NOTE — ED Notes (Signed)
Pt asked for something for pain and something to drink I informed pt that she needs to be seen by EDP before she could get anything.

## 2012-07-28 ENCOUNTER — Emergency Department (HOSPITAL_COMMUNITY): Payer: Medicaid Other

## 2012-07-28 ENCOUNTER — Emergency Department (HOSPITAL_COMMUNITY)
Admission: EM | Admit: 2012-07-28 | Discharge: 2012-07-28 | Disposition: A | Discharge status: Home / Self Care | Payer: Medicaid Other | Attending: Emergency Medicine | Admitting: Emergency Medicine

## 2012-07-28 ENCOUNTER — Encounter (HOSPITAL_COMMUNITY): Payer: Self-pay | Admitting: *Deleted

## 2012-07-28 DIAGNOSIS — Z79899 Other long term (current) drug therapy: Secondary | ICD-10-CM | POA: Insufficient documentation

## 2012-07-28 DIAGNOSIS — F17200 Nicotine dependence, unspecified, uncomplicated: Secondary | ICD-10-CM | POA: Insufficient documentation

## 2012-07-28 DIAGNOSIS — Z8541 Personal history of malignant neoplasm of cervix uteri: Secondary | ICD-10-CM | POA: Insufficient documentation

## 2012-07-28 DIAGNOSIS — Z3202 Encounter for pregnancy test, result negative: Secondary | ICD-10-CM | POA: Insufficient documentation

## 2012-07-28 DIAGNOSIS — Z87442 Personal history of urinary calculi: Secondary | ICD-10-CM | POA: Insufficient documentation

## 2012-07-28 DIAGNOSIS — M51379 Other intervertebral disc degeneration, lumbosacral region without mention of lumbar back pain or lower extremity pain: Secondary | ICD-10-CM | POA: Insufficient documentation

## 2012-07-28 DIAGNOSIS — I10 Essential (primary) hypertension: Secondary | ICD-10-CM | POA: Insufficient documentation

## 2012-07-28 DIAGNOSIS — F3289 Other specified depressive episodes: Secondary | ICD-10-CM | POA: Insufficient documentation

## 2012-07-28 DIAGNOSIS — M5137 Other intervertebral disc degeneration, lumbosacral region: Secondary | ICD-10-CM | POA: Insufficient documentation

## 2012-07-28 DIAGNOSIS — F329 Major depressive disorder, single episode, unspecified: Secondary | ICD-10-CM | POA: Insufficient documentation

## 2012-07-28 DIAGNOSIS — M5136 Other intervertebral disc degeneration, lumbar region: Secondary | ICD-10-CM

## 2012-07-28 DIAGNOSIS — R112 Nausea with vomiting, unspecified: Secondary | ICD-10-CM | POA: Insufficient documentation

## 2012-07-28 DIAGNOSIS — A599 Trichomoniasis, unspecified: Secondary | ICD-10-CM | POA: Insufficient documentation

## 2012-07-28 HISTORY — DX: Malignant neoplasm of cervix uteri, unspecified: C53.9

## 2012-07-28 LAB — URINE MICROSCOPIC-ADD ON

## 2012-07-28 LAB — URINALYSIS, ROUTINE W REFLEX MICROSCOPIC
Hgb urine dipstick: NEGATIVE
Nitrite: NEGATIVE
Protein, ur: NEGATIVE mg/dL
Specific Gravity, Urine: 1.02 (ref 1.005–1.030)
Urobilinogen, UA: 1 mg/dL (ref 0.0–1.0)

## 2012-07-28 LAB — PREGNANCY, URINE: Preg Test, Ur: NEGATIVE

## 2012-07-28 MED ORDER — DEXAMETHASONE 4 MG PO TABS: ORAL_TABLET | ORAL | Status: DC

## 2012-07-28 MED ORDER — HYDROCODONE-ACETAMINOPHEN 5-325 MG PO TABS
2.0000 | ORAL_TABLET | Freq: Once | ORAL | Status: AC
  Administered 2012-07-28: 2 via ORAL
  Filled 2012-07-28: qty 2

## 2012-07-28 MED ORDER — SODIUM CHLORIDE 0.9 % IV SOLN
1000.0000 mL | Freq: Once | INTRAVENOUS | Status: AC
Start: 2012-07-28 — End: 2012-07-28
  Administered 2012-07-28: 1000 mL via INTRAVENOUS

## 2012-07-28 MED ORDER — DEXAMETHASONE SODIUM PHOSPHATE 4 MG/ML IJ SOLN
8.0000 mg | Freq: Once | INTRAMUSCULAR | Status: AC
  Administered 2012-07-28: 8 mg via INTRAVENOUS
  Filled 2012-07-28: qty 2

## 2012-07-28 MED ORDER — METHOCARBAMOL 500 MG PO TABS
1000.0000 mg | ORAL_TABLET | Freq: Once | ORAL | Status: AC
  Administered 2012-07-28: 1000 mg via ORAL
  Filled 2012-07-28: qty 2

## 2012-07-28 MED ORDER — SODIUM CHLORIDE 0.9 % IV SOLN
1000.0000 mL | INTRAVENOUS | Status: DC
  Administered 2012-07-28: 1000 mL via INTRAVENOUS

## 2012-07-28 MED ORDER — BACLOFEN 10 MG PO TABS: 10.0000 mg | ORAL_TABLET | Freq: Three times a day (TID) | ORAL | Status: DC

## 2012-07-28 MED ORDER — MELOXICAM 7.5 MG PO TABS: ORAL_TABLET | ORAL | Status: DC

## 2012-07-28 MED ORDER — PROMETHAZINE HCL 12.5 MG PO TABS
12.5000 mg | ORAL_TABLET | Freq: Once | ORAL | Status: AC
  Administered 2012-07-28: 12.5 mg via ORAL
  Filled 2012-07-28: qty 1

## 2012-07-28 MED ORDER — METRONIDAZOLE 500 MG PO TABS: 500.0000 mg | ORAL_TABLET | Freq: Two times a day (BID) | ORAL | Status: DC

## 2012-07-28 NOTE — ED Notes (Signed)
Left flank pain that started 4 days ago, admits to n/v, denies any urinary changes. Has hx of kidney stones and feels like she has one again.

## 2012-07-28 NOTE — ED Provider Notes (Signed)
History     CSN: [id number]  Arrival date & time 07/28/12  1153   First MD Initiated Contact with Patient 07/28/12 1211      Chief Complaint  Patient presents with  . Flank Pain    (Consider location/radiation/quality/duration/timing/severity/associated sxs/prior treatment) Patient is a 42 y.o. female presenting with flank pain. The history is provided by the patient.  Flank Pain The current episode started in the past 7 days. The problem occurs constantly. The problem has been gradually worsening. Associated symptoms include arthralgias, a fever, myalgias, nausea and vomiting. Pertinent negatives include no abdominal pain, chest pain, coughing, neck pain or rash. The symptoms are aggravated by standing, walking, twisting and coughing. She has tried oral narcotics and relaxation for the symptoms. The treatment provided no relief.    Past Medical History  Diagnosis Date  . Hypertension   . Depressed   . Cancer   . Renal disorder     kidney stones  . Drug addiction   . Cervical cancer     Past Surgical History  Procedure Date  . Appendectomy     History reviewed. No pertinent family history.  History  Substance Use Topics  . Smoking status: Current Every Day Smoker -- 1.0 packs/day    Types: Cigarettes  . Smokeless tobacco: Not on file  . Alcohol Use: No    OB History    Grav Para Term Preterm Abortions TAB SAB Ect Mult Living                  Review of Systems  Constitutional: Positive for fever. Negative for activity change.       All ROS Neg except as noted in HPI  HENT: Negative for nosebleeds and neck pain.   Eyes: Negative for photophobia and discharge.  Respiratory: Negative for cough, shortness of breath and wheezing.   Cardiovascular: Negative for chest pain and palpitations.  Gastrointestinal: Positive for nausea and vomiting. Negative for abdominal pain and blood in stool.  Genitourinary: Positive for flank pain. Negative for dysuria, frequency and  hematuria.  Musculoskeletal: Positive for myalgias and arthralgias. Negative for back pain.  Skin: Negative.  Negative for rash.  Neurological: Negative for dizziness, seizures and speech difficulty.  Psychiatric/Behavioral: Negative for hallucinations and confusion.    Allergies  Ketorolac tromethamine; Tramadol hcl; and Propoxyphene napsylate  Home Medications   Current Outpatient Rx  Name  Route  Sig  Dispense  Refill  . CLONAZEPAM 1 MG PO TABS   Oral   Take 1 mg by mouth 3 (three) times daily as needed. For anxiety         . HYDROCODONE-ACETAMINOPHEN 5-325 MG PO TABS   Oral   Take 1 tablet by mouth every 6 (six) hours as needed for pain.   10 tablet   0   . LISINOPRIL-HYDROCHLOROTHIAZIDE 20-12.5 MG PO TABS   Oral   Take 1 tablet by mouth daily.           . ADULT MULTIVITAMIN W/MINERALS CH   Oral   Take 1 tablet by mouth daily.         . OSELTAMIVIR PHOSPHATE 75 MG PO CAPS   Oral   Take 1 capsule (75 mg total) by mouth every 12 (twelve) hours.   20 capsule   0   . PROMETHAZINE HCL 25 MG PO TABS   Oral   Take 1 tablet (25 mg total) by mouth every 6 (six) hours as needed for nausea.   15  tablet   0   . PROMETHAZINE HCL 25 MG PO TABS   Oral   Take 1 tablet (25 mg total) by mouth every 6 (six) hours as needed for nausea.   15 tablet   0     BP 113/80  Pulse 105  Temp 97.5 F (36.4 C) (Oral)  Resp 20  Ht 5\' 2"  (1.575 m)  Wt 175 lb (79.379 kg)  BMI 32.01 kg/m2  SpO2 100%  LMP 07/04/2012  Physical Exam  Nursing note and vitals reviewed. Constitutional: She is oriented to person, place, and time. She appears well-developed and well-nourished.  Non-toxic appearance.  HENT:  Head: Normocephalic.  Right Ear: Tympanic membrane and external ear normal.  Left Ear: Tympanic membrane and external ear normal.  Eyes: EOM and lids are normal. Pupils are equal, round, and reactive to light.  Neck: Normal range of motion. Neck supple. Carotid bruit is not  present.  Cardiovascular: Normal rate, regular rhythm, normal heart sounds, intact distal pulses and normal pulses.   Pulmonary/Chest: Breath sounds normal. No respiratory distress.       No left rib area tenderness appreciated. No deformity noted. No bruising on the left flank area.  Abdominal: Soft. Bowel sounds are normal. There is no tenderness. There is no guarding.  Musculoskeletal: Normal range of motion.       There is pain in the paraspinal area of the lower lumbar region left more than right. There is pain on the left lower lumbar area with straight leg raises. Distal pulses are 2+ and symmetrical.  Lymphadenopathy:       Head (right side): No submandibular adenopathy present.       Head (left side): No submandibular adenopathy present.    She has no cervical adenopathy.  Neurological: She is alert and oriented to person, place, and time. She has normal strength. No cranial nerve deficit or sensory deficit.  Skin: Skin is warm and dry.  Psychiatric: She has a normal mood and affect. Her speech is normal.    ED Course  Procedures (including critical care time)   Labs Reviewed  URINALYSIS, ROUTINE W REFLEX MICROSCOPIC  PREGNANCY, URINE   No results found.   No diagnosis found.    MDM  I have reviewed nursing notes, vital signs, and all appropriate lab and imaging results for this patient. UA reveals trichomonas present with trace leukocytes. Culture of the urine sent to the lab.  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 Pt treated in the ED with decadron, robaxin and norco.  Rx for flagyl, mobic, and decadron ordered. Pt ambulatory at discharge. Discussed need for orthopedic evaluation with the patient.       Kathie Dike, [place] 08/05/12 [phone]

## 2012-07-31 ENCOUNTER — Encounter (HOSPITAL_COMMUNITY): Payer: Self-pay | Admitting: Emergency Medicine

## 2012-07-31 DIAGNOSIS — I10 Essential (primary) hypertension: Secondary | ICD-10-CM | POA: Insufficient documentation

## 2012-07-31 DIAGNOSIS — Z8541 Personal history of malignant neoplasm of cervix uteri: Secondary | ICD-10-CM | POA: Insufficient documentation

## 2012-07-31 DIAGNOSIS — IMO0002 Reserved for concepts with insufficient information to code with codable children: Secondary | ICD-10-CM | POA: Insufficient documentation

## 2012-07-31 DIAGNOSIS — Z87442 Personal history of urinary calculi: Secondary | ICD-10-CM | POA: Insufficient documentation

## 2012-07-31 DIAGNOSIS — F17200 Nicotine dependence, unspecified, uncomplicated: Secondary | ICD-10-CM | POA: Insufficient documentation

## 2012-07-31 DIAGNOSIS — Z79899 Other long term (current) drug therapy: Secondary | ICD-10-CM | POA: Insufficient documentation

## 2012-07-31 DIAGNOSIS — Z8659 Personal history of other mental and behavioral disorders: Secondary | ICD-10-CM | POA: Insufficient documentation

## 2012-07-31 NOTE — ED Provider Notes (Signed)
History     CSN: [id number]  Arrival date & time 07/31/12  2316   First MD Initiated Contact with Patient 07/31/12 2319      Chief Complaint  Patient presents with  . Back Pain    (Consider location/radiation/quality/duration/timing/severity/associated sxs/prior treatment) Patient is a 42 y.o. female presenting with back pain. The history is provided by the patient.  Back Pain  This is a new problem. The current episode started more than 2 days ago. The problem occurs constantly. The problem has not changed since onset.The pain is associated with no known injury. The pain is present in the sacro-iliac joint. The quality of the pain is described as shooting. The pain radiates to the left thigh. The pain is moderate. The symptoms are aggravated by twisting, bending and certain positions. Associated symptoms include leg pain. Pertinent negatives include no chest pain, no fever, no numbness, no headaches, no abdominal pain, no abdominal swelling, no bowel incontinence, no perianal numbness, no bladder incontinence, no dysuria, no pelvic pain, no paresthesias, no paresis, no tingling and no weakness. She has tried analgesics, heat and muscle relaxants for the symptoms. The treatment provided mild relief.    Past Medical History  Diagnosis Date  . Hypertension   . Depressed   . Cancer   . Renal disorder     kidney stones  . Drug addiction   . Cervical cancer     Past Surgical History  Procedure Date  . Appendectomy     No family history on file.  History  Substance Use Topics  . Smoking status: Current Every Day Smoker -- 1.0 packs/day    Types: Cigarettes  . Smokeless tobacco: Not on file  . Alcohol Use: No    OB History    Grav Para Term Preterm Abortions TAB SAB Ect Mult Living                  Review of Systems  Constitutional: Negative for fever and chills.  Respiratory: Negative for shortness of breath.   Cardiovascular: Negative for chest pain.    Gastrointestinal: Negative for vomiting, abdominal pain, constipation and bowel incontinence.  Genitourinary: Negative for bladder incontinence, dysuria, hematuria, flank pain, decreased urine volume, difficulty urinating and pelvic pain.       No perineal numbness or incontinence of urine or feces  Musculoskeletal: Positive for back pain. Negative for joint swelling.  Skin: Negative for rash.  Neurological: Negative for dizziness, tingling, weakness, light-headedness, numbness, headaches and paresthesias.  All other systems reviewed and are negative.    Allergies  Ketorolac tromethamine; Tramadol hcl; and Propoxyphene napsylate  Home Medications   Current Outpatient Rx  Name  Route  Sig  Dispense  Refill  . BACLOFEN 10 MG PO TABS   Oral   Take 1 tablet (10 mg total) by mouth 3 (three) times daily.   21 each   0   . CLONAZEPAM 1 MG PO TABS   Oral   Take 1 mg by mouth 3 (three) times daily as needed. For anxiety         . DEXAMETHASONE 4 MG PO TABS      1 po daily   7 tablet   0   . HYDROCODONE-ACETAMINOPHEN 5-325 MG PO TABS   Oral   Take 1 tablet by mouth every 6 (six) hours as needed for pain.   10 tablet   0   . LISINOPRIL-HYDROCHLOROTHIAZIDE 20-12.5 MG PO TABS   Oral   Take 1  tablet by mouth daily.           . MELOXICAM 7.5 MG PO TABS      1 po bid with food   12 tablet   0   . METRONIDAZOLE 500 MG PO TABS   Oral   Take 1 tablet (500 mg total) by mouth 2 (two) times daily.   14 tablet   0     Take with food. No alcohol   . ADULT MULTIVITAMIN W/MINERALS CH   Oral   Take 1 tablet by mouth daily.         Marland Kitchen NAPROXEN SODIUM 220 MG PO TABS   Oral   Take 220 mg by mouth 2 (two) times daily as needed. Pain         . OSELTAMIVIR PHOSPHATE 75 MG PO CAPS   Oral   Take 1 capsule (75 mg total) by mouth every 12 (twelve) hours.   20 capsule   0   . PROMETHAZINE HCL 25 MG PO TABS   Oral   Take 1 tablet (25 mg total) by mouth every 6 (six) hours  as needed for nausea.   15 tablet   0     BP 129/86  Pulse 88  Temp 98.2 F (36.8 C) (Oral)  Resp 18  Ht 5\' 2"  (1.575 m)  Wt 165 lb (74.844 kg)  BMI 30.18 kg/m2  SpO2 99%  LMP 07/04/2012  Physical Exam  Nursing note and vitals reviewed. Constitutional: She is oriented to person, place, and time. She appears well-developed and well-nourished. No distress.  HENT:  Head: Normocephalic and atraumatic.  Neck: Normal range of motion. Neck supple.  Cardiovascular: Normal rate, regular rhythm, normal heart sounds and intact distal pulses.   No murmur heard. Pulmonary/Chest: Effort normal and breath sounds normal. No respiratory distress.  Abdominal: Soft. She exhibits no distension and no mass. There is no tenderness. There is no rebound and no guarding.  Musculoskeletal: She exhibits tenderness. She exhibits no edema.       Lumbar back: She exhibits tenderness and pain. She exhibits normal range of motion, no bony tenderness, no swelling, no deformity, no laceration and normal pulse.       Back:       ttp of the left lumbar paraspinal muscles and SI joint.  Pain is reproduced with SLR on left and flexion at the left hip.  Dp pulse is brisk, distal sensation intact.  No calf pain or edema.    Neurological: She is alert and oriented to person, place, and time. No sensory deficit. She exhibits normal muscle tone. Coordination and gait normal.  Reflex Scores:      Patellar reflexes are 2+ on the right side and 2+ on the left side.      Achilles reflexes are 2+ on the right side and 2+ on the left side. Skin: Skin is warm and dry.    ED Course  Procedures (including critical care time)  Labs Reviewed - No data to display No results found.      MDM    Patient seen here for same on 07/28/12. Had LS spine film and urinalysis, results and ED chart reviewed.  Treated with Rx for baclofen, decadron, mobic and flagyl.  Patient ambulates with a slow but steady gait.  Sx's likely  related to sciatica.  Pt to see her PMD at [facility] next week.  Doubt emergent neurological or infectious process.    Pain improved after percocet  and flexeril    Nadira Single L. Kenetra Hildenbrand, [place] 08/01/12 0126

## 2012-07-31 NOTE — ED Notes (Signed)
Patient c/o lower back pain that radiates into the left groin and down left leg.

## 2012-08-01 ENCOUNTER — Emergency Department (HOSPITAL_COMMUNITY)
Admission: EM | Admit: 2012-08-01 | Discharge: 2012-08-01 | Disposition: A | Discharge status: Home / Self Care | Payer: Medicaid Other | Attending: Emergency Medicine | Admitting: Emergency Medicine

## 2012-08-01 DIAGNOSIS — M5416 Radiculopathy, lumbar region: Secondary | ICD-10-CM

## 2012-08-01 MED ORDER — OXYCODONE-ACETAMINOPHEN 5-325 MG PO TABS
1.0000 | ORAL_TABLET | Freq: Once | ORAL | Status: AC
  Administered 2012-08-01: 1 via ORAL
  Filled 2012-08-01: qty 1

## 2012-08-01 MED ORDER — HYDROCODONE-ACETAMINOPHEN 5-325 MG PO TABS: ORAL_TABLET | ORAL | Status: DC

## 2012-08-01 MED ORDER — CYCLOBENZAPRINE HCL 10 MG PO TABS
10.0000 mg | ORAL_TABLET | Freq: Once | ORAL | Status: AC
  Administered 2012-08-01: 10 mg via ORAL
  Filled 2012-08-01: qty 1

## 2012-08-01 MED ORDER — CYCLOBENZAPRINE HCL 10 MG PO TABS: 10.0000 mg | ORAL_TABLET | Freq: Three times a day (TID) | ORAL | Status: DC | PRN

## 2012-08-02 NOTE — ED Provider Notes (Signed)
Medical screening examination/treatment/procedure(s) were performed by non-physician practitioner and as supervising physician I was immediately available for consultation/collaboration.   Tiannah Greenly III, MD 08/02/12 2040 

## 2012-08-02 NOTE — ED Provider Notes (Signed)
History     CSN: [id number]  Arrival date & time 07/28/12  1153   First MD Initiated Contact with Patient 07/28/12 1211      Chief Complaint  Patient presents with  . Flank Pain    (Consider location/radiation/quality/duration/timing/severity/associated sxs/prior treatment) HPI  Past Medical History  Diagnosis Date  . Hypertension   . Depressed   . Cancer   . Renal disorder     kidney stones  . Drug addiction   . Cervical cancer     Past Surgical History  Procedure Date  . Appendectomy     History reviewed. No pertinent family history.  History  Substance Use Topics  . Smoking status: Current Every Day Smoker -- 1.0 packs/day    Types: Cigarettes  . Smokeless tobacco: Not on file  . Alcohol Use: No    OB History    Grav Para Term Preterm Abortions TAB SAB Ect Mult Living                  Review of Systems  Allergies  Ketorolac tromethamine; Tramadol hcl; and Propoxyphene napsylate  Home Medications   Current Outpatient Rx  Name  Route  Sig  Dispense  Refill  . CLONAZEPAM 1 MG PO TABS   Oral   Take 1 mg by mouth 3 (three) times daily as needed. For anxiety         . HYDROCODONE-ACETAMINOPHEN 5-325 MG PO TABS   Oral   Take 1 tablet by mouth every 6 (six) hours as needed for pain.   10 tablet   0   . LISINOPRIL-HYDROCHLOROTHIAZIDE 20-12.5 MG PO TABS   Oral   Take 1 tablet by mouth daily.           . ADULT MULTIVITAMIN W/MINERALS CH   Oral   Take 1 tablet by mouth daily.         Marland Kitchen NAPROXEN SODIUM 220 MG PO TABS   Oral   Take 220 mg by mouth 2 (two) times daily as needed. Pain         . OSELTAMIVIR PHOSPHATE 75 MG PO CAPS   Oral   Take 1 capsule (75 mg total) by mouth every 12 (twelve) hours.   20 capsule   0   . PROMETHAZINE HCL 25 MG PO TABS   Oral   Take 1 tablet (25 mg total) by mouth every 6 (six) hours as needed for nausea.   15 tablet   0   . BACLOFEN 10 MG PO TABS   Oral   Take 1 tablet (10 mg total) by mouth 3  (three) times daily.   21 each   0   . CYCLOBENZAPRINE HCL 10 MG PO TABS   Oral   Take 1 tablet (10 mg total) by mouth 3 (three) times daily as needed for muscle spasms.   21 tablet   0   . DEXAMETHASONE 4 MG PO TABS      1 po daily   7 tablet   0   . HYDROCODONE-ACETAMINOPHEN 5-325 MG PO TABS      Take one-two tabs po q 4-6 hrs prn pain   10 tablet   0   . MELOXICAM 7.5 MG PO TABS      1 po bid with food   12 tablet   0   . METRONIDAZOLE 500 MG PO TABS   Oral   Take 1 tablet (500 mg total) by mouth 2 (two) times daily.  14 tablet   0     Take with food. No alcohol     BP 113/80  Pulse 105  Temp 97.5 F (36.4 C) (Oral)  Resp 20  Ht 5\' 2"  (1.575 m)  Wt 175 lb (79.379 kg)  BMI 32.01 kg/m2  SpO2 100%  LMP 07/04/2012  Physical Exam  ED Course  Procedures (including critical care time)  Labs Reviewed  URINALYSIS, ROUTINE W REFLEX MICROSCOPIC - Abnormal; Notable for the following:    Leukocytes, UA TRACE (*)     All other components within normal limits  URINE MICROSCOPIC-ADD ON - Abnormal; Notable for the following:    Squamous Epithelial / LPF MANY (*)     All other components within normal limits  PREGNANCY, URINE  LAB REPORT - SCANNED   No results found.   1. Degenerative disc disease, lumbar   2. Trichomonas infection       MDM  Medical screening examination/treatment/procedure(s) were performed by non-physician practitioner and as supervising physician I was immediately available for consultation/collaboration.        Donnetta Hutching, MD 08/02/12 [phone]

## 2012-08-05 ENCOUNTER — Emergency Department (HOSPITAL_COMMUNITY): Payer: Medicaid Other

## 2012-08-05 ENCOUNTER — Encounter (HOSPITAL_COMMUNITY): Payer: Self-pay | Admitting: *Deleted

## 2012-08-05 ENCOUNTER — Inpatient Hospital Stay (HOSPITAL_COMMUNITY)
Admission: EM | Admit: 2012-08-05 | Discharge: 2012-08-08 | DRG: 372 | Disposition: A | Discharge status: Home Health | Payer: Medicaid Other | Attending: Internal Medicine | Admitting: Internal Medicine

## 2012-08-05 DIAGNOSIS — K279 Peptic ulcer, site unspecified, unspecified as acute or chronic, without hemorrhage or perforation: Secondary | ICD-10-CM

## 2012-08-05 DIAGNOSIS — R635 Abnormal weight gain: Secondary | ICD-10-CM

## 2012-08-05 DIAGNOSIS — M129 Arthropathy, unspecified: Secondary | ICD-10-CM

## 2012-08-05 DIAGNOSIS — Z8541 Personal history of malignant neoplasm of cervix uteri: Secondary | ICD-10-CM

## 2012-08-05 DIAGNOSIS — F329 Major depressive disorder, single episode, unspecified: Secondary | ICD-10-CM

## 2012-08-05 DIAGNOSIS — D638 Anemia in other chronic diseases classified elsewhere: Secondary | ICD-10-CM | POA: Diagnosis present

## 2012-08-05 DIAGNOSIS — F3289 Other specified depressive episodes: Secondary | ICD-10-CM

## 2012-08-05 DIAGNOSIS — M542 Cervicalgia: Secondary | ICD-10-CM

## 2012-08-05 DIAGNOSIS — M545 Low back pain, unspecified: Secondary | ICD-10-CM

## 2012-08-05 DIAGNOSIS — Z720 Tobacco use: Secondary | ICD-10-CM

## 2012-08-05 DIAGNOSIS — Z87442 Personal history of urinary calculi: Secondary | ICD-10-CM

## 2012-08-05 DIAGNOSIS — F32A Depression, unspecified: Secondary | ICD-10-CM | POA: Diagnosis present

## 2012-08-05 DIAGNOSIS — E785 Hyperlipidemia, unspecified: Secondary | ICD-10-CM

## 2012-08-05 DIAGNOSIS — N859 Noninflammatory disorder of uterus, unspecified: Secondary | ICD-10-CM

## 2012-08-05 DIAGNOSIS — D72829 Elevated white blood cell count, unspecified: Secondary | ICD-10-CM

## 2012-08-05 DIAGNOSIS — F411 Generalized anxiety disorder: Secondary | ICD-10-CM

## 2012-08-05 DIAGNOSIS — Z79899 Other long term (current) drug therapy: Secondary | ICD-10-CM

## 2012-08-05 DIAGNOSIS — F1911 Other psychoactive substance abuse, in remission: Secondary | ICD-10-CM

## 2012-08-05 DIAGNOSIS — K219 Gastro-esophageal reflux disease without esophagitis: Secondary | ICD-10-CM

## 2012-08-05 DIAGNOSIS — Z87898 Personal history of other specified conditions: Secondary | ICD-10-CM

## 2012-08-05 DIAGNOSIS — I10 Essential (primary) hypertension: Secondary | ICD-10-CM

## 2012-08-05 DIAGNOSIS — R05 Cough: Secondary | ICD-10-CM

## 2012-08-05 DIAGNOSIS — E871 Hypo-osmolality and hyponatremia: Secondary | ICD-10-CM | POA: Diagnosis present

## 2012-08-05 DIAGNOSIS — F17200 Nicotine dependence, unspecified, uncomplicated: Secondary | ICD-10-CM

## 2012-08-05 DIAGNOSIS — B1920 Unspecified viral hepatitis C without hepatic coma: Secondary | ICD-10-CM | POA: Diagnosis present

## 2012-08-05 DIAGNOSIS — R1900 Intra-abdominal and pelvic swelling, mass and lump, unspecified site: Secondary | ICD-10-CM

## 2012-08-05 DIAGNOSIS — A4902 Methicillin resistant Staphylococcus aureus infection, unspecified site: Secondary | ICD-10-CM | POA: Diagnosis present

## 2012-08-05 DIAGNOSIS — K3184 Gastroparesis: Secondary | ICD-10-CM

## 2012-08-05 DIAGNOSIS — L408 Other psoriasis: Secondary | ICD-10-CM

## 2012-08-05 DIAGNOSIS — R059 Cough, unspecified: Secondary | ICD-10-CM

## 2012-08-05 DIAGNOSIS — K6812 Psoas muscle abscess: Secondary | ICD-10-CM | POA: Diagnosis present

## 2012-08-05 LAB — CBC WITH DIFFERENTIAL/PLATELET
Basophils Relative: 0 % (ref 0–1)
Eosinophils Relative: 0 % (ref 0–5)
HCT: 33.3 % — ABNORMAL LOW (ref 36.0–46.0)
Hemoglobin: 11 g/dL — ABNORMAL LOW (ref 12.0–15.0)
Lymphocytes Relative: 17 % (ref 12–46)
MCH: 29.2 pg (ref 26.0–34.0)
Neutro Abs: 20.4 10*3/uL — ABNORMAL HIGH (ref 1.7–7.7)
Neutrophils Relative %: 78 % — ABNORMAL HIGH (ref 43–77)
RBC: 3.77 MIL/uL — ABNORMAL LOW (ref 3.87–5.11)

## 2012-08-05 LAB — HEPATIC FUNCTION PANEL
Albumin: 2.6 g/dL — ABNORMAL LOW (ref 3.5–5.2)
Alkaline Phosphatase: 62 U/L (ref 39–117)
Total Bilirubin: 0.4 mg/dL (ref 0.3–1.2)

## 2012-08-05 LAB — URINALYSIS, ROUTINE W REFLEX MICROSCOPIC
Ketones, ur: NEGATIVE mg/dL
Leukocytes, UA: NEGATIVE
Nitrite: NEGATIVE
Specific Gravity, Urine: >1.03 — ABNORMAL HIGH (ref 1.005–1.030)
pH: 5.5 (ref 5.0–8.0)

## 2012-08-05 LAB — BASIC METABOLIC PANEL
CO2: 24 mEq/L (ref 19–32)
Chloride: 94 mEq/L — ABNORMAL LOW (ref 96–112)
GFR calc Af Amer: >90 mL/min (ref >90)
Potassium: 4.4 mEq/L (ref 3.5–5.1)
Sodium: 129 mEq/L — ABNORMAL LOW (ref 135–145)

## 2012-08-05 LAB — PROTIME-INR: Prothrombin Time: 14.3 seconds (ref 11.6–15.2)

## 2012-08-05 MED ORDER — DOCUSATE SODIUM 100 MG PO CAPS
100.0000 mg | ORAL_CAPSULE | Freq: Two times a day (BID) | ORAL | Status: DC
  Administered 2012-08-05 – 2012-08-08 (×6): 100 mg via ORAL
  Filled 2012-08-05 (×6): qty 1

## 2012-08-05 MED ORDER — MORPHINE SULFATE 10 MG/ML IJ SOLN
10.0000 mg | Freq: Once | INTRAMUSCULAR | Status: AC
  Administered 2012-08-05: 10 mg via INTRAMUSCULAR
  Filled 2012-08-05: qty 1

## 2012-08-05 MED ORDER — HYDROMORPHONE HCL PF 1 MG/ML IJ SOLN
1.0000 mg | INTRAMUSCULAR | Status: DC | PRN
  Administered 2012-08-05 – 2012-08-07 (×12): 1 mg via INTRAVENOUS
  Filled 2012-08-05 (×11): qty 1

## 2012-08-05 MED ORDER — IOHEXOL 300 MG/ML  SOLN
100.0000 mL | Freq: Once | INTRAMUSCULAR | Status: AC | PRN
  Administered 2012-08-05: 100 mL via INTRAVENOUS

## 2012-08-05 MED ORDER — ACETAMINOPHEN 325 MG PO TABS
650.0000 mg | ORAL_TABLET | Freq: Four times a day (QID) | ORAL | Status: DC | PRN
  Administered 2012-08-06: 650 mg via ORAL
  Filled 2012-08-05: qty 2

## 2012-08-05 MED ORDER — PIPERACILLIN-TAZOBACTAM 3.375 G IVPB
3.3750 g | Freq: Three times a day (TID) | INTRAVENOUS | Status: DC
  Administered 2012-08-05 – 2012-08-08 (×9): 3.375 g via INTRAVENOUS
  Filled 2012-08-05 (×15): qty 50

## 2012-08-05 MED ORDER — VANCOMYCIN HCL IN DEXTROSE 1-5 GM/200ML-% IV SOLN
1000.0000 mg | Freq: Two times a day (BID) | INTRAVENOUS | Status: DC
  Administered 2012-08-05 – 2012-08-07 (×4): 1000 mg via INTRAVENOUS
  Filled 2012-08-05 (×4): qty 200

## 2012-08-05 MED ORDER — NICOTINE 21 MG/24HR TD PT24
21.0000 mg | MEDICATED_PATCH | Freq: Every day | TRANSDERMAL | Status: DC
  Administered 2012-08-05 – 2012-08-07 (×3): 21 mg via TRANSDERMAL
  Filled 2012-08-05 (×4): qty 1

## 2012-08-05 MED ORDER — LORAZEPAM 1 MG PO TABS
1.0000 mg | ORAL_TABLET | Freq: Once | ORAL | Status: AC
  Administered 2012-08-06: 1 mg via ORAL
  Filled 2012-08-05: qty 1

## 2012-08-05 MED ORDER — IOHEXOL 300 MG/ML  SOLN
50.0000 mL | Freq: Once | INTRAMUSCULAR | Status: AC | PRN
  Administered 2012-08-05: 50 mL via ORAL

## 2012-08-05 MED ORDER — CLONAZEPAM 0.5 MG PO TABS
1.0000 mg | ORAL_TABLET | Freq: Three times a day (TID) | ORAL | Status: DC | PRN
  Administered 2012-08-05 – 2012-08-08 (×6): 1 mg via ORAL
  Filled 2012-08-05 (×3): qty 2
  Filled 2012-08-05: qty 1
  Filled 2012-08-05: qty 2
  Filled 2012-08-05: qty 1
  Filled 2012-08-05: qty 2

## 2012-08-05 MED ORDER — CYCLOBENZAPRINE HCL 10 MG PO TABS
10.0000 mg | ORAL_TABLET | Freq: Three times a day (TID) | ORAL | Status: DC | PRN
  Administered 2012-08-06 – 2012-08-07 (×4): 10 mg via ORAL
  Filled 2012-08-05 (×4): qty 1

## 2012-08-05 MED ORDER — ONDANSETRON HCL 4 MG/2ML IJ SOLN
4.0000 mg | Freq: Four times a day (QID) | INTRAMUSCULAR | Status: DC
  Administered 2012-08-05: 4 mg via INTRAVENOUS
  Filled 2012-08-05: qty 2

## 2012-08-05 MED ORDER — HYDROMORPHONE HCL PF 1 MG/ML IJ SOLN
1.0000 mg | Freq: Once | INTRAMUSCULAR | Status: AC
  Administered 2012-08-05: 1 mg via INTRAVENOUS
  Filled 2012-08-05: qty 1

## 2012-08-05 MED ORDER — OXYCODONE-ACETAMINOPHEN 5-325 MG PO TABS: 2.0000 | ORAL_TABLET | Freq: Once | ORAL | Status: DC

## 2012-08-05 MED ORDER — ONDANSETRON 4 MG PO TBDP
4.0000 mg | ORAL_TABLET | Freq: Once | ORAL | Status: AC
Start: 2012-08-05 — End: 2012-08-05
  Administered 2012-08-05: 4 mg via ORAL
  Filled 2012-08-05: qty 1

## 2012-08-05 MED ORDER — PIPERACILLIN-TAZOBACTAM 3.375 G IVPB
INTRAVENOUS | Status: AC
  Filled 2012-08-05: qty 100

## 2012-08-05 MED ORDER — SODIUM CHLORIDE 0.9 % IV SOLN
INTRAVENOUS | Status: DC
  Administered 2012-08-05 – 2012-08-06 (×2): via INTRAVENOUS
  Administered 2012-08-07: 125 mL/h via INTRAVENOUS

## 2012-08-05 MED ORDER — ONDANSETRON HCL 4 MG/2ML IJ SOLN
4.0000 mg | Freq: Four times a day (QID) | INTRAMUSCULAR | Status: DC | PRN
  Administered 2012-08-06: 4 mg via INTRAVENOUS
  Filled 2012-08-05: qty 2

## 2012-08-05 MED ORDER — MAGNESIUM HYDROXIDE 400 MG/5ML PO SUSP
30.0000 mL | Freq: Every day | ORAL | Status: DC | PRN
  Administered 2012-08-07: 30 mL via ORAL
  Filled 2012-08-05: qty 30

## 2012-08-05 MED ORDER — HYDROMORPHONE HCL PF 1 MG/ML IJ SOLN
1.0000 mg | INTRAMUSCULAR | Status: DC
  Administered 2012-08-05: 1 mg via INTRAVENOUS
  Filled 2012-08-05: qty 1

## 2012-08-05 MED ORDER — ONDANSETRON HCL 4 MG PO TABS: 4.0000 mg | ORAL_TABLET | Freq: Four times a day (QID) | ORAL | Status: DC | PRN

## 2012-08-05 MED ORDER — ACETAMINOPHEN 650 MG RE SUPP: 650.0000 mg | Freq: Four times a day (QID) | RECTAL | Status: DC | PRN

## 2012-08-05 MED ORDER — VANCOMYCIN HCL IN DEXTROSE 1-5 GM/200ML-% IV SOLN
INTRAVENOUS | Status: AC
  Filled 2012-08-05: qty 200

## 2012-08-05 MED ORDER — SODIUM CHLORIDE 0.9 % IV SOLN: INTRAVENOUS | Status: DC

## 2012-08-05 MED ORDER — KETOROLAC TROMETHAMINE 60 MG/2ML IM SOLN: 60.0000 mg | Freq: Once | INTRAMUSCULAR | Status: DC

## 2012-08-05 NOTE — ED Notes (Signed)
RN at bedside

## 2012-08-05 NOTE — ED Provider Notes (Signed)
History     CSN: [id number]  Arrival date & time 08/05/12  1120   First MD Initiated Contact with Patient 08/05/12 1158      Chief Complaint  Patient presents with  . Back Pain    (Consider location/radiation/quality/duration/timing/severity/associated sxs/prior treatment) The history is provided by the patient.   [name] is a 42 year old female who presents the emergency department with chief complaint of left-sided back pain.  The patient has been seen previously twice in the past week first on January 7, then on January 11.  Patient complaint of flank pain on the seventh.  Patient was diagnosed with trichomonas UTI and lumbar degenerative disc disease.  She was discharged with Flagyl, Mobic, and Decadron.  Patient diagnosed with and treated for back pain on the 11th.  She has been urged to followup with her primary care and orthopedics.  Patient presents today with her mother who is here.  She is extremely tearful upon presentation.  The patient states that her back pain is left-sided and radiates into the left groin and labia.  She does have a previous medical history of kidney stones.  She states that this is not like her her kidney stone because it is the worst pain she's ever had in her life.  Patient denies any urinary symptoms.  She describes the pain as constant with intermittent severe or spasms.  She has been unable to sleep.  Her mother is also tearful. The patient is able to walk but complains of extreme pain.  Past Medical History  Diagnosis Date  . Hypertension   . Depressed   . Drug addiction   . Cancer   . Cervical cancer   . Renal disorder     kidney stones    Past Surgical History  Procedure Date  . Appendectomy   . Tubal ligation   . Mandible surgery for fracture     History reviewed. No pertinent family history.  History  Substance Use Topics  . Smoking status: Current Every Day Smoker -- 1.0 packs/day    Types: Cigarettes  . Smokeless  tobacco: Not on file  . Alcohol Use: No    OB History    Grav Para Term Preterm Abortions TAB SAB Ect Mult Living                  Review of Systems Ten systems reviewed and are negative for acute change, except as noted in the HPI.   Allergies  Ketorolac tromethamine; Tramadol hcl; and Propoxyphene napsylate  Home Medications   Current Outpatient Rx  Name  Route  Sig  Dispense  Refill  . CLONAZEPAM 1 MG PO TABS   Oral   Take 1 mg by mouth 3 (three) times daily as needed. For anxiety         . CYCLOBENZAPRINE HCL 10 MG PO TABS   Oral   Take 1 tablet (10 mg total) by mouth 3 (three) times daily as needed for muscle spasms.   21 tablet   0   . DEXAMETHASONE 4 MG PO TABS   Oral   Take 4 mg by mouth daily.         Marland Kitchen LISINOPRIL-HYDROCHLOROTHIAZIDE 20-12.5 MG PO TABS   Oral   Take 1 tablet by mouth daily.           . MELOXICAM 7.5 MG PO TABS   Oral   Take 7.5 mg by mouth 2 (two) times daily.         Marland Kitchen  METRONIDAZOLE 500 MG PO TABS   Oral   Take 1 tablet (500 mg total) by mouth 2 (two) times daily.   14 tablet   0     Take with food. No alcohol   . ADULT MULTIVITAMIN W/MINERALS CH   Oral   Take 1 tablet by mouth daily.         Marland Kitchen NAPROXEN SODIUM 220 MG PO TABS   Oral   Take 220 mg by mouth 2 (two) times daily as needed. Pain         . PROMETHAZINE HCL 25 MG PO TABS   Oral   Take 1 tablet (25 mg total) by mouth every 6 (six) hours as needed for nausea.   15 tablet   0     BP 105/73  Pulse 103  Temp 98.6 F (37 C) (Oral)  Resp 18  Ht 5\' 2"  (1.575 m)  Wt 165 lb (74.844 kg)  BMI 30.18 kg/m2  SpO2 100%  LMP 07/04/2012  Physical Exam  Nursing note and vitals reviewed. Constitutional: She is oriented to person, place, and time. She appears well-developed and well-nourished. No distress.       Patient is tearful.  She is begging for pain medicine.  She states that she is in the worst pain she is ever been in her life.  HENT:  Head:  Normocephalic and atraumatic.  Eyes: Conjunctivae normal are normal. No scleral icterus.  Neck: Normal range of motion.  Cardiovascular: Regular rhythm and normal heart sounds.  Exam reveals no gallop and no friction rub.   No murmur heard.      Tachycardic  Pulmonary/Chest: Effort normal and breath sounds normal. No respiratory distress.  Abdominal: She exhibits no distension and no mass. There is no tenderness. There is no guarding.  Neurological: She is alert and oriented to person, place, and time.  Skin: Skin is warm and dry. She is not diaphoretic.    ED Course  Procedures (including critical care time)  Results for orders placed during the hospital encounter of 08/05/12  CBC WITH DIFFERENTIAL      Component Value Range   WBC 26.2 (*) 4.0 - 10.5 K/uL   RBC 3.77 (*) 3.87 - 5.11 MIL/uL   Hemoglobin 11.0 (*) 12.0 - 15.0 g/dL   HCT 40.9 (*) 81.1 - 91.4 %   MCV 88.3  78.0 - 100.0 fL   MCH 29.2  26.0 - 34.0 pg   MCHC 33.0  30.0 - 36.0 g/dL   RDW 78.2  95.6 - 21.3 %   Platelets 512 (*) 150 - 400 K/uL   Neutrophils Relative PENDING  43 - 77 %   Neutro Abs PENDING  1.7 - 7.7 K/uL   Band Neutrophils PENDING  0 - 10 %   Lymphocytes Relative PENDING  12 - 46 %   Lymphs Abs PENDING  0.7 - 4.0 K/uL   Monocytes Relative PENDING  3 - 12 %   Monocytes Absolute PENDING  0.1 - 1.0 K/uL   Eosinophils Relative PENDING  0 - 5 %   Eosinophils Absolute PENDING  0.0 - 0.7 K/uL   Basophils Relative PENDING  0 - 1 %   Basophils Absolute PENDING  0.0 - 0.1 K/uL   WBC Morphology PENDING     RBC Morphology PENDING     Smear Review PENDING     nRBC PENDING  0 /100 WBC   Metamyelocytes Relative PENDING     Myelocytes PENDING  Promyelocytes Absolute PENDING     Blasts PENDING    URINALYSIS, ROUTINE W REFLEX MICROSCOPIC      Component Value Range   Color, Urine YELLOW  YELLOW   APPearance CLEAR  CLEAR   Specific Gravity, Urine >1.030 (*) 1.005 - 1.030   pH 5.5  5.0 - 8.0   Glucose, UA  NEGATIVE  NEGATIVE mg/dL   Hgb urine dipstick NEGATIVE  NEGATIVE   Bilirubin Urine NEGATIVE  NEGATIVE   Ketones, ur NEGATIVE  NEGATIVE mg/dL   Protein, ur NEGATIVE  NEGATIVE mg/dL   Urobilinogen, UA 0.2  0.0 - 1.0 mg/dL   Nitrite NEGATIVE  NEGATIVE   Leukocytes, UA NEGATIVE  NEGATIVE    Labs Reviewed - No data to display Ct Abdomen Pelvis Wo Contrast  08/05/2012  **ADDENDUM** CREATED: 08/05/2012 13:25:03  I discussed these findings by telephone with Arthor Captain in the emergency department at 1326 hours on 08/05/2012.  **END ADDENDUM** SIGNED BY: Minerva Areola A. Molli Posey, M.D.   08/05/2012  *RADIOLOGY REPORT*  Clinical Data: Left side flank and back pain.  CT ABDOMEN AND PELVIS WITHOUT CONTRAST  Technique:  Multidetector CT imaging of the abdomen and pelvis was performed following the standard protocol without intravenous contrast.  Comparison: 08/05/2011  Findings: Images which include the lung bases show left lower lobe atelectasis with tiny left pleural effusion.  No focal abnormalities seen in the liver or spleen on this study performed without intravenous contrast material.  Retrocrural and distal para esophageal lymph nodes are evident.  Stomach is decompressed.  The duodenum and pancreas are unremarkable.  Gallbladder and adrenal glands have normal imaging features.  No abnormalities seen in the right kidney.  A large 18 x 7 x 8 cm "mass" is identified in the left retroperitoneal space.  This is difficult to assess given the lack of intravenous contrast, but appears to be centered in the left psoas muscle displacing the left kidney anteriorly.  There are some small lymph nodes in the left para-aortic space.  Imaging through the pelvis shows no free intraperitoneal fluid.  No pelvic sidewall lymphadenopathy.  Uterus is unremarkable.  No adnexal mass.  No substantial diverticular change in the colon. Terminal ileum is normal.  Suture line at the cecal tip compatible with reported history of prior  appendectomy.  Bone windows reveal no worrisome lytic or sclerotic osseous lesions.  IMPRESSION: Large left retroperitoneal "mass" the appears to arise from the left psoas muscle, this is difficult to confirm without intravenous contrast material.  This could be a psoas abscess or primary retroperitoneal neoplasm.  Repeat CT of the abdomen and pelvis with IV contrast recommended to further evaluate.  Small retrocrural and distal para esophageal lymph nodes with some small periaortic lymphadenopathy in the abdomen.  These may well be secondary to the process in the left retroperitoneal space.   Original Report Authenticated By: Kennith Center, M.D.      1. Retroperitoneal mass   2. Leukocytosis       MDM  12:45 PM Patient is tachycardic.  She does have a history of kidney stones in with complaint of unilateral flank pain with radiation to the groin I am concerned for possible kidney stone.  I will obtain CT of the abdomen.  Her UA From 1/7 was negative for blood or crystals.   1:41 PM spoke with Dr. Molli Posey in radiology reports that the patient has a large 18 x 7 x 8 cm mass in the retroperitoneal space.  Patient also presents with  a white count of 26.2.  Concerning for possible abscess along the left iliopsoas.  He requested that we get CT abdomen with contrast.  Patient still complains of severe pain.  Am reducing her pain medication. Patient also states she has a history of IV drug use.  She is hep C positive.  Her last use was approximately 6 months ago.   2:19 PM  spoke with Dr. Lendell Caprice who has agreed to admit the patient for her retroperitoneal mass.  We will not start him.  Antibiotics until after CT with contrast is obtained.  Plan for now is pain control. We'll obtain blood cultures.  Patient has been on Flagyl for her trichomoniasis UTI over the past week.      Arthor Captain, PA-C 08/07/12 [phone]

## 2012-08-05 NOTE — ED Notes (Signed)
Low back pain for 3 weeks, mostly on lt side.  No know injury, Has been seen here for same

## 2012-08-05 NOTE — ED Notes (Signed)
Cont. To rate pain 10/10 scale, additional meds given to pt/

## 2012-08-05 NOTE — ED Provider Notes (Signed)
Medical screening examination/treatment/procedure(s) were conducted as a shared visit with non-physician practitioner(s) and myself.  I personally evaluated the patient during the encounter    CT findings are consistent with most likely a psoas abscess on the left. Patient has marked leukocytosis not toxic no acute distress but will require IV antibiotics and admission and CT directed drainage.  We'll discuss with hospitalist for a [place] and antibiotic preference.  Shelda Jakes, MD 08/05/12 [phone]

## 2012-08-05 NOTE — Progress Notes (Signed)
Attempted to call nurse to get report. Nurse now busy.

## 2012-08-05 NOTE — ED Notes (Signed)
Pt tearful in pain. Advised awaiting ct scan. meds given.

## 2012-08-05 NOTE — Progress Notes (Signed)
ANTIBIOTIC CONSULT NOTE - INITIAL  Pharmacy Consult for Vancomycin Indication: psoas abscess  Allergies  Allergen Reactions  . Ketorolac Tromethamine Nausea And Vomiting  . Tramadol Hcl Swelling    Of tongue  . Propoxyphene Napsylate Rash    Patient Measurements: Height: 5\' 2"  (157.5 cm) Weight: 172 lb 3.2 oz (78.109 kg) IBW/kg (Calculated) : 50.1   Vital Signs: Temp: 98 F (36.7 C) (01/15 1726) Temp src: Oral (01/15 1726) BP: 116/77 mmHg (01/15 1726) Pulse Rate: 103  (01/15 1726) Intake/Output from previous day:   Intake/Output from this shift:    Labs:  Basename 08/05/12 1150  WBC 26.2*  HGB 11.0*  PLT 512*  LABCREA --  CREATININE 0.57   Estimated Creatinine Clearance: 89.6 ml/min (by C-G formula based on Cr of 0.57). No results found for this basename: VANCOTROUGH:2,VANCOPEAK:2,VANCORANDOM:2,GENTTROUGH:2,GENTPEAK:2,GENTRANDOM:2,TOBRATROUGH:2,TOBRAPEAK:2,TOBRARND:2,AMIKACINPEAK:2,AMIKACINTROU:2,AMIKACIN:2, in the last 72 hours   Microbiology: No results found for this or any previous visit (from the past 720 hour(s)).  Medical History: Past Medical History  Diagnosis Date  . Hypertension   . Depressed   . Drug addiction Hep C  . Cancer   . Cervical cancer   . Renal disorder     kidney stones  . Hepatitis     Medications:  Scheduled:    . docusate sodium  100 mg Oral BID  . [COMPLETED]  HYDROmorphone (DILAUDID) injection  1 mg Intravenous Once  . [COMPLETED]  morphine injection  10 mg Intramuscular Once  . nicotine  21 mg Transdermal Daily  . [COMPLETED] ondansetron  4 mg Oral Once  . piperacillin-tazobactam (ZOSYN)  IV  3.375 g Intravenous Q8H  . [DISCONTINUED] sodium chloride   Intravenous STAT  . [DISCONTINUED]  HYDROmorphone (DILAUDID) injection  1 mg Intravenous Q1H  . [DISCONTINUED] ketorolac  60 mg Intramuscular Once  . [DISCONTINUED] ondansetron (ZOFRAN) IV  4 mg Intravenous Q6H  . [DISCONTINUED] oxyCODONE-acetaminophen  2 tablet Oral  Once   Assessment: 42 yo F with hx IV drug abuse presents with left-side psoas abscess per CT.  She is empirically starting IV Vancomycin & Zosyn.  Her renal function is at baseline. WBC elevated.  Noted plans to transfer to [facility] tomorrow for drainage & drain placement as well as need for long-term IV abx.  Goal of Therapy:  Vancomycin trough level 15-20 mcg/ml  Plan:  1) Vancomycin 1gm IV Q12h 2) Check Vancomycin trough at steady state 3) Monitor renal function and cx data   Elson Clan 08/05/2012,6:30 PM
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
## 2012-08-05 NOTE — ED Notes (Signed)
Attempted to call report nurse busy

## 2012-08-06 ENCOUNTER — Encounter (HOSPITAL_COMMUNITY): Payer: Self-pay

## 2012-08-06 ENCOUNTER — Ambulatory Visit (HOSPITAL_COMMUNITY)
Admit: 2012-08-06 | Discharge: 2012-08-06 | Disposition: A | Discharge status: Home / Self Care | Payer: Medicaid Other | Attending: Internal Medicine | Admitting: Internal Medicine

## 2012-08-06 ENCOUNTER — Inpatient Hospital Stay (HOSPITAL_COMMUNITY): Payer: Medicaid Other

## 2012-08-06 DIAGNOSIS — R1900 Intra-abdominal and pelvic swelling, mass and lump, unspecified site: Secondary | ICD-10-CM

## 2012-08-06 LAB — BASIC METABOLIC PANEL
CO2: 23 mEq/L (ref 19–32)
Calcium: 8.7 mg/dL (ref 8.4–10.5)
GFR calc non Af Amer: >90 mL/min (ref >90)
Potassium: 4 mEq/L (ref 3.5–5.1)
Sodium: 129 mEq/L — ABNORMAL LOW (ref 135–145)

## 2012-08-06 LAB — HIV ANTIBODY (ROUTINE TESTING W REFLEX): HIV: NONREACTIVE

## 2012-08-06 MED ORDER — SODIUM CHLORIDE 0.9 % IJ SOLN
10.0000 mL | Freq: Two times a day (BID) | INTRAMUSCULAR | Status: DC
  Administered 2012-08-06 – 2012-08-08 (×3): 10 mL
  Filled 2012-08-06: qty 3

## 2012-08-06 MED ORDER — FENTANYL CITRATE 0.05 MG/ML IJ SOLN
INTRAMUSCULAR | Status: AC | PRN
  Administered 2012-08-06 (×2): 50 ug via INTRAVENOUS

## 2012-08-06 MED ORDER — SODIUM CHLORIDE 0.9 % IJ SOLN: 10.0000 mL | INTRAMUSCULAR | Status: DC | PRN

## 2012-08-06 MED ORDER — MIDAZOLAM HCL 2 MG/2ML IJ SOLN
INTRAMUSCULAR | Status: AC | PRN
  Administered 2012-08-06: 2 mg via INTRAVENOUS
  Administered 2012-08-06: 1 mg via INTRAVENOUS

## 2012-08-06 NOTE — Procedures (Signed)
L perinephric abscess drain.  Frank pus.

## 2012-08-06 NOTE — Progress Notes (Signed)
UR Chart Review Completed  

## 2012-08-06 NOTE — Progress Notes (Signed)
Transported to [facility] per [facility].

## 2012-08-06 NOTE — Progress Notes (Signed)
Report received from [facility]. Pt did fine. Waiting on [facility] for transport back to [facility].

## 2012-08-06 NOTE — Progress Notes (Signed)
Subjective: This lady was admitted with what appears to be now a left-sided psoas abscess. She is going to have this drained this morning at [facility]. She is on intravenous antibiotics and will need to remain on these antibiotics for several weeks.           Physical Exam: Blood pressure 122/60, pulse 98, temperature 101.1 F (38.4 C), temperature source Oral, resp. rate 18, height 5\' 2"  (1.575 m), weight 78.109 kg (172 lb 3.2 oz), last menstrual period 07/04/2012, SpO2 95.00%. She does look systemically well. She is not septic or toxic. Heart sounds are present and normal. Lung fields are essentially clear. Her abdomen is soft anteriorly but is tender in the left loin area, consistent with her psoas abscess. She is alert and orientated.   Investigations:  No results found for this or any previous visit (from the past 240 hour(s)).   Basic Metabolic Panel:  Basename 08/06/12 0401 08/05/12 1150  NA 129* 129*  K 4.0 4.4  CL 94* 94*  CO2 23 24  GLUCOSE 109* 78  BUN 11 18  CREATININE 0.57 0.57  CALCIUM 8.7 9.2  MG -- --  PHOS -- --   Liver Function Tests:  [facility] 08/05/12 1948  AST 9  ALT 8  ALKPHOS 62  BILITOT 0.4  PROT 7.6  ALBUMIN 2.6*     CBC:  Basename 08/05/12 1150  WBC 26.2*  NEUTROABS 20.4*  HGB 11.0*  HCT 33.3*  MCV 88.3  PLT 512*    Ct Abdomen Pelvis Wo Contrast  08/05/2012  **ADDENDUM** CREATED: 08/05/2012 13:25:03  I discussed these findings by telephone with Arthor Captain in the emergency department at 1326 hours on 08/05/2012.  **END ADDENDUM** SIGNED BY: Minerva Areola A. Molli Posey, M.D.   08/05/2012  *RADIOLOGY REPORT*  Clinical Data: Left side flank and back pain.  CT ABDOMEN AND PELVIS WITHOUT CONTRAST  Technique:  Multidetector CT imaging of the abdomen and pelvis was performed following the standard protocol without intravenous contrast.  Comparison: 08/05/2011  Findings: Images which include the lung bases show left lower lobe  atelectasis with tiny left pleural effusion.  No focal abnormalities seen in the liver or spleen on this study performed without intravenous contrast material.  Retrocrural and distal para esophageal lymph nodes are evident.  Stomach is decompressed.  The duodenum and pancreas are unremarkable.  Gallbladder and adrenal glands have normal imaging features.  No abnormalities seen in the right kidney.  A large 18 x 7 x 8 cm "mass" is identified in the left retroperitoneal space.  This is difficult to assess given the lack of intravenous contrast, but appears to be centered in the left psoas muscle displacing the left kidney anteriorly.  There are some small lymph nodes in the left para-aortic space.  Imaging through the pelvis shows no free intraperitoneal fluid.  No pelvic sidewall lymphadenopathy.  Uterus is unremarkable.  No adnexal mass.  No substantial diverticular change in the colon. Terminal ileum is normal.  Suture line at the cecal tip compatible with reported history of prior appendectomy.  Bone windows reveal no worrisome lytic or sclerotic osseous lesions.  IMPRESSION: Large left retroperitoneal "mass" the appears to arise from the left psoas muscle, this is difficult to confirm without intravenous contrast material.  This could be a psoas abscess or primary retroperitoneal neoplasm.  Repeat CT of the abdomen and pelvis with IV contrast recommended to further evaluate.  Small retrocrural and distal para esophageal lymph nodes with  some small periaortic lymphadenopathy in the abdomen.  These may well be secondary to the process in the left retroperitoneal space.   Original Report Authenticated By: Kennith Center, M.D.    Ct Abdomen Pelvis W Contrast  08/05/2012  *RADIOLOGY REPORT*  Clinical Data: Retroperitoneal mass  CT ABDOMEN AND PELVIS WITH CONTRAST  Technique:  Multidetector CT imaging of the abdomen and pelvis was performed following the standard protocol during bolus administration of intravenous  contrast.  Contrast: 50mL OMNIPAQUE IOHEXOL 300 MG/ML  SOLN, OMNIPAQUE IOHEXOL 300 MG/ML  SOLN  Comparison: Earlier today  Findings: There is a small left pleural effusion.  Atelectasis is noted within both lung bases.  There is a focal area of low attenuation within the medial right hepatic lobe, image 25.  This measures 1.3 x 2.0 cm.  The remaining portions of the liver are normal.  Normal appearance of the gallbladder.  The pancreas is within normal limits.  The spleen is normal.  Normal appearance of both adrenal glands.  The right kidney appears normal.  There is a large left sided retroperitoneal mass measuring 8.6 x 8.6 x 12.7 cm.  This appears to be centered within the left psoas muscle.  There is significant mass effect upon the posterior aspect of the left kidney.  The internal portions of this "mass" do not exhibit any enhancement.  There is a small amount of fat stranding within the surrounding retroperitoneal space.  The urinary bladder is normal.  Uterus has a normal appearance. Cyst within the left ovary is identified measuring the 2.3 cm, image 76.  Normal appearance of the aorta.  No aneurysm.  Multiple prominent retroperitoneal lymph nodes are identified.  Index periaortic node measures 1.2 cm, image 40.  Adjacent periaortic lymph node measures 1.1 cm, image 38.  No pelvic or inguinal adenopathy.  The stomach appears normal.  The small bowel loops are within normal limits. Previous appendectomy.  Normal appearance of the colon.  Review of the visualized osseous structures shows no aggressive lytic or sclerotic bone lesions.  IMPRESSION:  1.  Large "mass" within the left retroperitoneal space appears to be centered within the psoas muscle.  Although peripherally enhancing, the central, linear density portions of this structure do not enhance.  Findings are favored to represent a large psoas abscess.  Less likely would be a tumor. A percutaneous drainage and/or tissue sampling is advised.  2.   Multiple prominent retroperitoneal lymph nodes within the upper abdomen.  These are favored to be reactive in etiology.  Metastatic adenopathy is considered less favored but not excluded.  Attention on follow-up imaging is advised. 3.  Nonenhancing low attenuation structure within the medial right hepatic lobe is nonspecific but is favored to be a benign etiology. 4.  Left pleural effusion.   Original Report Authenticated By: Signa Kell, M.D.    Dg Chest Port 1 View  08/06/2012  *RADIOLOGY REPORT*  Clinical Data: Confirm line placement  PORTABLE CHEST - 1 VIEW  Comparison: 07/11/2010  Findings: Mild left basilar atelectasis.  Lungs otherwise clear. No pleural effusion or pneumothorax.  Cardiomediastinal silhouette is within normal limits.  Right arm PICC terminates at cavoatrial junction.  IMPRESSION: Right arm PICC terminates at the cavoatrial junction.   Original Report Authenticated By: Charline Bills, M.D.       Medications: I have reviewed the patient's current medications.  Impression: 1. Left psoas abscess. 2. Hypertension. 3. Anxiety and depression. 4. Tobacco abuse. 5. History of intravenous drug abuse. 6. Hyponatremia,  likely secondary to degree of dehydration.     Plan: 1. Continue with intravenous antibiotics. She will need several weeks of intravenous antibiotics. 2. Incision and drainage of abscess today by interventional radiology.      LOS: 1 day   Wilson Singer Pager [phone]  08/06/2012, 11:13 AM

## 2012-08-06 NOTE — ED Provider Notes (Signed)
Medical screening examination/treatment/procedure(s) were performed by non-physician practitioner and as supervising physician I was immediately available for consultation/collaboration.  Donnetta Hutching, MD 08/06/12 [id number]

## 2012-08-06 NOTE — Progress Notes (Signed)
Patient ID: [name], female   DOB: [birth year], 42 y.o.   MRN: [id number] Request received for CT guided drainage of a left psoas abscess on pt with hx of polysubstance abuse, cervical cancer, hepatitis C , worsening abdominal and left flank pain and elevated WBC. Imaging studies were reviewed by Dr. Bonnielee Haff. Additional PMH as below. Exam: pt awake/alert and c/o mod left flank/abd pain; chest- sl dim BS bases; heart- RRR; abd- soft,+BS, mod tender abd region primarily LUQ/LLQ and left CVA tenderness; ext- FROM.    Filed Vitals:   08/05/12 1726 08/05/12 2100 08/06/12 0500 08/06/12 1100  BP: 116/77 119/61 127/62 122/60  Pulse: 103 107 113 98  Temp: 98 F (36.7 C) 100.5 F (38.1 C) 101.1 F (38.4 C)   TempSrc: Oral Oral Oral   Resp: 18 20 18    Height: 5\' 2"  (1.575 m)     Weight: 172 lb 3.2 oz (78.109 kg)     SpO2: 99% 92% 95%    Past Medical History  Diagnosis Date  . Hypertension   . Depressed   . Drug addiction Hep C  . Cancer   . Cervical cancer   . Renal disorder     kidney stones  . Hepatitis    Past Surgical History  Procedure Date  . Appendectomy   . Tubal ligation   . Mandible surgery for fracture   Ct Abdomen Pelvis Wo Contrast  08/05/2012  **ADDENDUM** CREATED: 08/05/2012 13:25:03  I discussed these findings by telephone with Arthor Captain in the emergency department at 1326 hours on 08/05/2012.  **END ADDENDUM** SIGNED BY: Minerva Areola A. Molli Posey, M.D.   08/05/2012  *RADIOLOGY REPORT*  Clinical Data: Left side flank and back pain.  CT ABDOMEN AND PELVIS WITHOUT CONTRAST  Technique:  Multidetector CT imaging of the abdomen and pelvis was performed following the standard protocol without intravenous contrast.  Comparison: 08/05/2011  Findings: Images which include the lung bases show left lower lobe atelectasis with tiny left pleural effusion.  No focal abnormalities seen in the liver or spleen on this study performed without intravenous contrast material.  Retrocrural and  distal para esophageal lymph nodes are evident.  Stomach is decompressed.  The duodenum and pancreas are unremarkable.  Gallbladder and adrenal glands have normal imaging features.  No abnormalities seen in the right kidney.  A large 18 x 7 x 8 cm "mass" is identified in the left retroperitoneal space.  This is difficult to assess given the lack of intravenous contrast, but appears to be centered in the left psoas muscle displacing the left kidney anteriorly.  There are some small lymph nodes in the left para-aortic space.  Imaging through the pelvis shows no free intraperitoneal fluid.  No pelvic sidewall lymphadenopathy.  Uterus is unremarkable.  No adnexal mass.  No substantial diverticular change in the colon. Terminal ileum is normal.  Suture line at the cecal tip compatible with reported history of prior appendectomy.  Bone windows reveal no worrisome lytic or sclerotic osseous lesions.  IMPRESSION: Large left retroperitoneal "mass" the appears to arise from the left psoas muscle, this is difficult to confirm without intravenous contrast material.  This could be a psoas abscess or primary retroperitoneal neoplasm.  Repeat CT of the abdomen and pelvis with IV contrast recommended to further evaluate.  Small retrocrural and distal para esophageal lymph nodes with some small periaortic lymphadenopathy in the abdomen.  These may well be secondary to the process in the left retroperitoneal space.   Original Report  Authenticated By: Kennith Center, M.D.    Dg Lumbar Spine Complete  07/28/2012  *RADIOLOGY REPORT*  Clinical Data: 42 year old female with low back pain.  LUMBAR SPINE - COMPLETE 4+ VIEW  Comparison: 08/05/2011 abdominal/pelvic CT.  Findings: Five non-rib bearing lumbar type vertebra are identified.  There is no evidence of fracture or subluxation.  Very mild multilevel degenerative disc disease is noted. There is no evidence of focal bony lesion or spondylolysis.  IMPRESSION: Very mild multilevel  degenerative disc disease without other significant abnormality.   Original Report Authenticated By: Harmon Pier, M.D.    Ct Abdomen Pelvis W Contrast  08/05/2012  *RADIOLOGY REPORT*  Clinical Data: Retroperitoneal mass  CT ABDOMEN AND PELVIS WITH CONTRAST  Technique:  Multidetector CT imaging of the abdomen and pelvis was performed following the standard protocol during bolus administration of intravenous contrast.  Contrast: 50mL OMNIPAQUE IOHEXOL 300 MG/ML  SOLN, OMNIPAQUE IOHEXOL 300 MG/ML  SOLN  Comparison: Earlier today  Findings: There is a small left pleural effusion.  Atelectasis is noted within both lung bases.  There is a focal area of low attenuation within the medial right hepatic lobe, image 25.  This measures 1.3 x 2.0 cm.  The remaining portions of the liver are normal.  Normal appearance of the gallbladder.  The pancreas is within normal limits.  The spleen is normal.  Normal appearance of both adrenal glands.  The right kidney appears normal.  There is a large left sided retroperitoneal mass measuring 8.6 x 8.6 x 12.7 cm.  This appears to be centered within the left psoas muscle.  There is significant mass effect upon the posterior aspect of the left kidney.  The internal portions of this "mass" do not exhibit any enhancement.  There is a small amount of fat stranding within the surrounding retroperitoneal space.  The urinary bladder is normal.  Uterus has a normal appearance. Cyst within the left ovary is identified measuring the 2.3 cm, image 76.  Normal appearance of the aorta.  No aneurysm.  Multiple prominent retroperitoneal lymph nodes are identified.  Index periaortic node measures 1.2 cm, image 40.  Adjacent periaortic lymph node measures 1.1 cm, image 38.  No pelvic or inguinal adenopathy.  The stomach appears normal.  The small bowel loops are within normal limits. Previous appendectomy.  Normal appearance of the colon.  Review of the visualized osseous structures shows no  aggressive lytic or sclerotic bone lesions.  IMPRESSION:  1.  Large "mass" within the left retroperitoneal space appears to be centered within the psoas muscle.  Although peripherally enhancing, the central, linear density portions of this structure do not enhance.  Findings are favored to represent a large psoas abscess.  Less likely would be a tumor. A percutaneous drainage and/or tissue sampling is advised.  2.  Multiple prominent retroperitoneal lymph nodes within the upper abdomen.  These are favored to be reactive in etiology.  Metastatic adenopathy is considered less favored but not excluded.  Attention on follow-up imaging is advised. 3.  Nonenhancing low attenuation structure within the medial right hepatic lobe is nonspecific but is favored to be a benign etiology. 4.  Left pleural effusion.   Original Report Authenticated By: Signa Kell, M.D.    Dg Chest Port 1 View  08/06/2012  *RADIOLOGY REPORT*  Clinical Data: Confirm line placement  PORTABLE CHEST - 1 VIEW  Comparison: 07/11/2010  Findings: Mild left basilar atelectasis.  Lungs otherwise clear. No pleural effusion or pneumothorax.  Cardiomediastinal silhouette is  within normal limits.  Right arm PICC terminates at cavoatrial junction.  IMPRESSION: Right arm PICC terminates at the cavoatrial junction.   Original Report Authenticated By: Charline Bills, M.D.   Results for orders placed during the hospital encounter of 08/05/12  CBC WITH DIFFERENTIAL      Component Value Range   WBC 26.2 (*) 4.0 - 10.5 K/uL   RBC 3.77 (*) 3.87 - 5.11 MIL/uL   Hemoglobin 11.0 (*) 12.0 - 15.0 g/dL   HCT 16.1 (*) 09.6 - 04.5 %   MCV 88.3  78.0 - 100.0 fL   MCH 29.2  26.0 - 34.0 pg   MCHC 33.0  30.0 - 36.0 g/dL   RDW 40.9  81.1 - 91.4 %   Platelets 512 (*) 150 - 400 K/uL   Neutrophils Relative 78 (*) 43 - 77 %   Lymphocytes Relative 17  12 - 46 %   Monocytes Relative 5  3 - 12 %   Eosinophils Relative 0  0 - 5 %   Basophils Relative 0  0 - 1 %    Neutro Abs 20.4 (*) 1.7 - 7.7 K/uL   Lymphs Abs 4.5 (*) 0.7 - 4.0 K/uL   Monocytes Absolute 1.3 (*) 0.1 - 1.0 K/uL   Eosinophils Absolute 0.0  0.0 - 0.7 K/uL   Basophils Absolute 0.0  0.0 - 0.1 K/uL   RBC Morphology POLYCHROMASIA PRESENT     WBC Morphology ATYPICAL LYMPHOCYTES     Smear Review PLATELET COUNT CONFIRMED BY SMEAR    BASIC METABOLIC PANEL      Component Value Range   Sodium 129 (*) 135 - 145 mEq/L   Potassium 4.4  3.5 - 5.1 mEq/L   Chloride 94 (*) 96 - 112 mEq/L   CO2 24  19 - 32 mEq/L   Glucose, Bld 78  70 - 99 mg/dL   BUN 18  6 - 23 mg/dL   Creatinine, Ser 7.82  0.50 - 1.10 mg/dL   Calcium 9.2  8.4 - 95.6 mg/dL   GFR calc non Af Amer >90  >90 mL/min   GFR calc Af Amer >90  >90 mL/min  URINALYSIS, ROUTINE W REFLEX MICROSCOPIC      Component Value Range   Color, Urine YELLOW  YELLOW   APPearance CLEAR  CLEAR   Specific Gravity, Urine >1.030 (*) 1.005 - 1.030   pH 5.5  5.0 - 8.0   Glucose, UA NEGATIVE  NEGATIVE mg/dL   Hgb urine dipstick NEGATIVE  NEGATIVE   Bilirubin Urine NEGATIVE  NEGATIVE   Ketones, ur NEGATIVE  NEGATIVE mg/dL   Protein, ur NEGATIVE  NEGATIVE mg/dL   Urobilinogen, UA 0.2  0.0 - 1.0 mg/dL   Nitrite NEGATIVE  NEGATIVE   Leukocytes, UA NEGATIVE  NEGATIVE  HEPATIC FUNCTION PANEL      Component Value Range   Total Protein 7.6  6.0 - 8.3 g/dL   Albumin 2.6 (*) 3.5 - 5.2 g/dL   AST 9  0 - 37 U/L   ALT 8  0 - 35 U/L   Alkaline Phosphatase 62  39 - 117 U/L   Total Bilirubin 0.4  0.3 - 1.2 mg/dL   Bilirubin, Direct 0.1  0.0 - 0.3 mg/dL   Indirect Bilirubin 0.3  0.3 - 0.9 mg/dL  PROTIME-INR      Component Value Range   Prothrombin Time 14.3  11.6 - 15.2 seconds   INR 1.13  0.00 - 1.49  APTT  Component Value Range   aPTT 32  24 - 37 seconds  BASIC METABOLIC PANEL      Component Value Range   Sodium 129 (*) 135 - 145 mEq/L   Potassium 4.0  3.5 - 5.1 mEq/L   Chloride 94 (*) 96 - 112 mEq/L   CO2 23  19 - 32 mEq/L   Glucose, Bld 109 (*) 70  - 99 mg/dL   BUN 11  6 - 23 mg/dL   Creatinine, Ser 4.09  0.50 - 1.10 mg/dL   Calcium 8.7  8.4 - 81.1 mg/dL   GFR calc non Af Amer >90  >90 mL/min   GFR calc Af Amer >90  >90 mL/min   A/P: Pt with left psoas abscess, elevated WBC, worsening abd/left flank pain, polysubstance abuse, cervical cancer and hepatitis C. Plan is for CT guided drainage of the left psoas abscess today. Details/risks of procedure d/w pt with her understanding and consent.

## 2012-08-07 LAB — CBC
HCT: 24.6 % — ABNORMAL LOW (ref 36.0–46.0)
Platelets: 329 10*3/uL (ref 150–400)
RDW: 14.9 % (ref 11.5–15.5)
WBC: 22.5 10*3/uL — ABNORMAL HIGH (ref 4.0–10.5)

## 2012-08-07 LAB — COMPREHENSIVE METABOLIC PANEL
ALT: 6 U/L (ref 0–35)
AST: 7 U/L (ref 0–37)
Albumin: 2.1 g/dL — ABNORMAL LOW (ref 3.5–5.2)
Alkaline Phosphatase: 55 U/L (ref 39–117)
Chloride: 92 mEq/L — ABNORMAL LOW (ref 96–112)
Potassium: 3.9 mEq/L (ref 3.5–5.1)
Total Bilirubin: 0.2 mg/dL — ABNORMAL LOW (ref 0.3–1.2)

## 2012-08-07 LAB — VANCOMYCIN, TROUGH: Vancomycin Tr: 8.8 ug/mL — ABNORMAL LOW (ref 10.0–20.0)

## 2012-08-07 MED ORDER — HYDROMORPHONE HCL PF 1 MG/ML IJ SOLN
2.0000 mg | INTRAMUSCULAR | Status: DC | PRN
  Administered 2012-08-07: 1 mg via INTRAVENOUS
  Administered 2012-08-07 – 2012-08-08 (×6): 2 mg via INTRAVENOUS
  Filled 2012-08-07 (×2): qty 2
  Filled 2012-08-07: qty 1
  Filled 2012-08-07 (×4): qty 2

## 2012-08-07 MED ORDER — VANCOMYCIN HCL 1000 MG IV SOLR
750.0000 mg | Freq: Three times a day (TID) | INTRAVENOUS | Status: DC
  Administered 2012-08-07 – 2012-08-08 (×2): 750 mg via INTRAVENOUS
  Filled 2012-08-07 (×9): qty 750

## 2012-08-07 NOTE — Progress Notes (Signed)
Had a discussion with the patient in regards to leaving off the floor.  I explained to her due to her condition it is not safe to leave the floor she should stay on the floor so that she can can be monitored.  She verbalized understanding and stated that she would ambulate on the floor.  She had previously went down through short stay and "took a few drags" off a cigarette I voiced to her smoking on the premises is against company policy.  She verbalizes understanding.    I will continue to remind her and educate her as needed.

## 2012-08-07 NOTE — Progress Notes (Signed)
ANTIBIOTIC CONSULT NOTE  Pharmacy Consult for Vancomycin Indication: psoas abscess  Allergies  Allergen Reactions  . Ketorolac Tromethamine Nausea And Vomiting  . Tramadol Hcl Swelling    Of tongue  . Propoxyphene Napsylate Rash    Patient Measurements: Height: 5\' 2"  (157.5 cm) Weight: 172 lb 3.2 oz (78.109 kg) IBW/kg (Calculated) : 50.1   Vital Signs:   Intake/Output from previous day:   Intake/Output from this shift:    Labs:  Basename 08/07/12 0641 08/06/12 0401 08/05/12 1150  WBC 22.5* -- 26.2*  HGB 8.2* -- 11.0*  PLT 329 -- 512*  LABCREA -- -- --  CREATININE 0.83 0.57 0.57   Estimated Creatinine Clearance: 86.3 ml/min (by C-G formula based on Cr of 0.83).  Basename 08/07/12 0641  VANCOTROUGH 8.8*  VANCOPEAK --  Drue Dun --  GENTTROUGH --  GENTPEAK --  GENTRANDOM --  TOBRATROUGH --  Nolen Mu --  TOBRARND --  AMIKACINPEAK --  AMIKACINTROU --  AMIKACIN --     Microbiology: Recent Results (from the past 720 hour(s))  CULTURE, ROUTINE-ABSCESS     Status: Normal (Preliminary result)   Collection Time   08/06/12  6:18 PM      Component Value Range Status Comment   Specimen Description ABSCESS PERITONEAL CAVITY   Final    Special Requests NONE   Final    Gram Stain     Final    Value: FEW WBC PRESENT,BOTH PMN AND MONONUCLEAR     NO SQUAMOUS EPITHELIAL CELLS SEEN     MODERATE GRAM POSITIVE COCCI IN CLUSTERS     IN PAIRS   Culture Culture reincubated for better growth   Final    Report Status PENDING   Incomplete     Medical History: Past Medical History  Diagnosis Date  . Hypertension   . Depressed   . Drug addiction Hep C  . Cancer   . Cervical cancer   . Renal disorder     kidney stones  . Hepatitis     Medications:  Scheduled:     . docusate sodium  100 mg Oral BID  . nicotine  21 mg Transdermal Daily  . piperacillin-tazobactam (ZOSYN)  IV  3.375 g Intravenous Q8H  . sodium chloride  10-40 mL Intracatheter Q12H  . vancomycin   1,000 mg Intravenous Q12H   Assessment: 42 yo F with hx IV drug abuse presents with left-side psoas abscess per CT.    Goal of Therapy:  Vancomycin trough level 15-20 mcg/ml  Plan:  1) Increase Vancomycin 750ng IV Q8h 2) Repeat Vancomycin trough at steady state 3) Monitor renal function and cx data   Mady Gemma 08/07/2012,10:02 AM

## 2012-08-07 NOTE — Care Management Note (Unsigned)
    Page 1 of 1   08/07/2012     6:03:56 PM   CARE MANAGEMENT NOTE 08/07/2012  Patient:  [name], [name]   Account Number:  [id number]  Date Initiated:  08/07/2012  Documentation initiated by:  Rosemary Holms  Subjective/Objective Assessment:   Pt admitted from home where she lives with mother. to be DC'd on IV AB for 4-6 weeks. Pt has a PICC line and drain in abscess.     Action/Plan:   Pt has PCP set up for follow up. Be sure pt has DC instruction for MD f/u and drain care. DC summary/Drain care can be faxed to [phone]. Alert on fax should let them know that pt is new pt/appt 08/14/12 at 2:30. Call [facility] at [phone] to let   Anticipated DC Date:  08/08/2012   Anticipated DC Plan:  HOME W HOME HEALTH SERVICES      DC Planning Services  CM consult      Choice offered to / List presented to:          [facility] arranged  HH-1 RN      [facility] agency  [facility]   Status of service:  In process, will continue to follow Medicare Important Message given?   (If response is "NO", the following Medicare IM given date fields will be blank) Date Medicare IM given:   Date Additional Medicare IM given:    Discharge Disposition:    Per UR Regulation:    If discussed at Long Length of Stay Meetings, dates discussed:    Comments:  08/07/12 Rosemary Holms RN BSN CM Pt has PCP set up for follow up. Be sure pt has DC instruction for MD f/u and drain care. DC summary/Drain care can be faxed to [phone]. Alert on fax should let them know that pt is new pt/appt 08/14/12 at 2:30. Call [facility] at [phone] to let them know that pt was DC'd and IV AB will need to start.

## 2012-08-07 NOTE — Progress Notes (Signed)
Notified [name] in the pharmacy of the pts vancomycin trough of 8.8 this am.  He stated to gie the scheduled dose for now and then he will change it.

## 2012-08-07 NOTE — Progress Notes (Signed)
Spoke with Dr. Carlynn Purl the Radiologist from [facility] in regards to when it would be appropriate to remove the patients drain that was placed 08/06/12.  I voiced to him that the patient did not have a primary MD but would follow up with a MD at [facility].  He says that tho determine when it is appropriate to remove the drainage device would be to assess the drainage.  When the drainage is 20 cc or less and less purulent, then a follow up CT of the abdomen should be done to determine if the tube could be removed.  He also recommends that the drain should be flushed at least daily with normal saline.   Dr. Bunnie Pion recommendations was discussed with Dr. Nobie Putnam.  He verbalized understanding and planned to discharge her tomorrow.

## 2012-08-07 NOTE — Progress Notes (Signed)
Subjective: This lady was admitted with  left-sided psoas abscess. Yesterday, she had drain put in and she is now draining purulent brown discharge into a bag. She continues on intravenous antibiotics. She is in pain.           Physical Exam: Blood pressure 116/58, pulse 108, temperature 97.4 F (36.3 C), temperature source Oral, resp. rate 16, height 5\' 2"  (1.575 m), weight 78.109 kg (172 lb 3.2 oz), last menstrual period 07/04/2012, SpO2 96.00%. She does look systemically well. She is not septic or toxic. Heart sounds are present and normal. Lung fields are essentially clear. Her abdomen is soft she has a drain coming out of the left loin area with tubing going to her back brown purulent fluid. She is alert and orientated.   Investigations:  Recent Results (from the past 240 hour(s))  CULTURE, ROUTINE-ABSCESS     Status: Normal (Preliminary result)   Collection Time   08/06/12  6:18 PM      Component Value Range Status Comment   Specimen Description ABSCESS PERITONEAL CAVITY   Final    Special Requests NONE   Final    Gram Stain     Final    Value: FEW WBC PRESENT,BOTH PMN AND MONONUCLEAR     NO SQUAMOUS EPITHELIAL CELLS SEEN     MODERATE GRAM POSITIVE COCCI IN CLUSTERS     IN PAIRS   Culture Culture reincubated for better growth   Final    Report Status PENDING   Incomplete      Basic Metabolic Panel:  Basename 08/07/12 0641 08/06/12 0401  NA 125* 129*  K 3.9 4.0  CL 92* 94*  CO2 24 23  GLUCOSE 183* 109*  BUN 12 11  CREATININE 0.83 0.57  CALCIUM 8.8 8.7  MG -- --  PHOS -- --   Liver Function Tests:  [facility] 08/07/12 0641 08/05/12 1948  AST 7 9  ALT 6 8  ALKPHOS 55 62  BILITOT 0.2* 0.4  PROT 6.6 7.6  ALBUMIN 2.1* 2.6*     CBC:  Basename 08/07/12 0641 08/05/12 1150  WBC 22.5* 26.2*  NEUTROABS -- 20.4*  HGB 8.2* 11.0*  HCT 24.6* 33.3*  MCV 88.5 88.3  PLT 329 512*    Ct Abdomen Pelvis Wo Contrast  08/05/2012  **ADDENDUM** CREATED:  08/05/2012 13:25:03  I discussed these findings by telephone with Arthor Captain in the emergency department at 1326 hours on 08/05/2012.  **END ADDENDUM** SIGNED BY: Minerva Areola A. Molli Posey, M.D.   08/05/2012  *RADIOLOGY REPORT*  Clinical Data: Left side flank and back pain.  CT ABDOMEN AND PELVIS WITHOUT CONTRAST  Technique:  Multidetector CT imaging of the abdomen and pelvis was performed following the standard protocol without intravenous contrast.  Comparison: 08/05/2011  Findings: Images which include the lung bases show left lower lobe atelectasis with tiny left pleural effusion.  No focal abnormalities seen in the liver or spleen on this study performed without intravenous contrast material.  Retrocrural and distal para esophageal lymph nodes are evident.  Stomach is decompressed.  The duodenum and pancreas are unremarkable.  Gallbladder and adrenal glands have normal imaging features.  No abnormalities seen in the right kidney.  A large 18 x 7 x 8 cm "mass" is identified in the left retroperitoneal space.  This is difficult to assess given the lack of intravenous contrast, but appears to be centered in the left psoas muscle displacing the left kidney anteriorly.  There are some small lymph nodes  in the left para-aortic space.  Imaging through the pelvis shows no free intraperitoneal fluid.  No pelvic sidewall lymphadenopathy.  Uterus is unremarkable.  No adnexal mass.  No substantial diverticular change in the colon. Terminal ileum is normal.  Suture line at the cecal tip compatible with reported history of prior appendectomy.  Bone windows reveal no worrisome lytic or sclerotic osseous lesions.  IMPRESSION: Large left retroperitoneal "mass" the appears to arise from the left psoas muscle, this is difficult to confirm without intravenous contrast material.  This could be a psoas abscess or primary retroperitoneal neoplasm.  Repeat CT of the abdomen and pelvis with IV contrast recommended to further evaluate.  Small  retrocrural and distal para esophageal lymph nodes with some small periaortic lymphadenopathy in the abdomen.  These may well be secondary to the process in the left retroperitoneal space.   Original Report Authenticated By: Kennith Center, M.D.    Ct Abdomen Pelvis W Contrast  08/05/2012  *RADIOLOGY REPORT*  Clinical Data: Retroperitoneal mass  CT ABDOMEN AND PELVIS WITH CONTRAST  Technique:  Multidetector CT imaging of the abdomen and pelvis was performed following the standard protocol during bolus administration of intravenous contrast.  Contrast: 50mL OMNIPAQUE IOHEXOL 300 MG/ML  SOLN, OMNIPAQUE IOHEXOL 300 MG/ML  SOLN  Comparison: Earlier today  Findings: There is a small left pleural effusion.  Atelectasis is noted within both lung bases.  There is a focal area of low attenuation within the medial right hepatic lobe, image 25.  This measures 1.3 x 2.0 cm.  The remaining portions of the liver are normal.  Normal appearance of the gallbladder.  The pancreas is within normal limits.  The spleen is normal.  Normal appearance of both adrenal glands.  The right kidney appears normal.  There is a large left sided retroperitoneal mass measuring 8.6 x 8.6 x 12.7 cm.  This appears to be centered within the left psoas muscle.  There is significant mass effect upon the posterior aspect of the left kidney.  The internal portions of this "mass" do not exhibit any enhancement.  There is a small amount of fat stranding within the surrounding retroperitoneal space.  The urinary bladder is normal.  Uterus has a normal appearance. Cyst within the left ovary is identified measuring the 2.3 cm, image 76.  Normal appearance of the aorta.  No aneurysm.  Multiple prominent retroperitoneal lymph nodes are identified.  Index periaortic node measures 1.2 cm, image 40.  Adjacent periaortic lymph node measures 1.1 cm, image 38.  No pelvic or inguinal adenopathy.  The stomach appears normal.  The small bowel loops are within normal  limits. Previous appendectomy.  Normal appearance of the colon.  Review of the visualized osseous structures shows no aggressive lytic or sclerotic bone lesions.  IMPRESSION:  1.  Large "mass" within the left retroperitoneal space appears to be centered within the psoas muscle.  Although peripherally enhancing, the central, linear density portions of this structure do not enhance.  Findings are favored to represent a large psoas abscess.  Less likely would be a tumor. A percutaneous drainage and/or tissue sampling is advised.  2.  Multiple prominent retroperitoneal lymph nodes within the upper abdomen.  These are favored to be reactive in etiology.  Metastatic adenopathy is considered less favored but not excluded.  Attention on follow-up imaging is advised. 3.  Nonenhancing low attenuation structure within the medial right hepatic lobe is nonspecific but is favored to be a benign etiology. 4.  Left  pleural effusion.   Original Report Authenticated By: Signa Kell, M.D.    Dg Chest Port 1 View  08/06/2012  *RADIOLOGY REPORT*  Clinical Data: Confirm line placement  PORTABLE CHEST - 1 VIEW  Comparison: 07/11/2010  Findings: Mild left basilar atelectasis.  Lungs otherwise clear. No pleural effusion or pneumothorax.  Cardiomediastinal silhouette is within normal limits.  Right arm PICC terminates at cavoatrial junction.  IMPRESSION: Right arm PICC terminates at the cavoatrial junction.   Original Report Authenticated By: Charline Bills, M.D.       Medications: I have reviewed the patient's current medications.  Impression: 1. Left psoas abscess, status post drain.. 2. Hypertension. 3. Anxiety and depression. 4. Tobacco abuse. 5. History of intravenous drug abuse. 6. Hyponatremia, likely secondary to degree of dehydration.     Plan: 1. Continue with intravenous antibiotics. She will need several weeks of intravenous antibiotics. 2. I will find out what we need to do to take care of the  drain.      LOS: 2 days   Wilson Singer Pager [phone]  08/07/2012, 8:22 AM

## 2012-08-07 NOTE — Plan of Care (Signed)
Problem: Phase I Progression Outcomes Goal: Pain controlled with appropriate interventions Outcome: Progressing Pain med changed in dose and time interval.

## 2012-08-08 LAB — CBC
Hemoglobin: 8 g/dL — ABNORMAL LOW (ref 12.0–15.0)
Platelets: 400 10*3/uL (ref 150–400)
RBC: 2.81 MIL/uL — ABNORMAL LOW (ref 3.87–5.11)

## 2012-08-08 LAB — BASIC METABOLIC PANEL
CO2: 27 mEq/L (ref 19–32)
GFR calc non Af Amer: 72 mL/min — ABNORMAL LOW (ref >90)
Glucose, Bld: 101 mg/dL — ABNORMAL HIGH (ref 70–99)
Potassium: 4.5 mEq/L (ref 3.5–5.1)
Sodium: 134 mEq/L — ABNORMAL LOW (ref 135–145)

## 2012-08-08 MED ORDER — VANCOMYCIN HCL 10 G IV SOLR: 1500.0000 mg | Freq: Two times a day (BID) | INTRAVENOUS | Status: DC

## 2012-08-08 MED ORDER — HYDROMORPHONE HCL 4 MG PO TABS: 4.0000 mg | ORAL_TABLET | ORAL | Status: DC | PRN

## 2012-08-08 MED ORDER — VANCOMYCIN HCL 10 G IV SOLR
1500.0000 mg | Freq: Two times a day (BID) | INTRAVENOUS | Status: DC
  Filled 2012-08-08 (×4): qty 1500

## 2012-08-08 MED ORDER — VANCOMYCIN HCL 1000 MG IV SOLR
750.0000 mg | Freq: Once | INTRAVENOUS | Status: AC
  Administered 2012-08-08: 750 mg via INTRAVENOUS
  Filled 2012-08-08: qty 750

## 2012-08-08 MED ORDER — SENNA-DOCUSATE SODIUM 8.6-50 MG PO TABS: 1.0000 | ORAL_TABLET | Freq: Every day | ORAL | Status: DC

## 2012-08-08 NOTE — Progress Notes (Signed)
Patient caught trying to sneak off the floor again to smoke and security was called to escort her back upstairs.

## 2012-08-08 NOTE — Progress Notes (Signed)
Discussed  With Dr. Nobie Putnam the plan for patiens care.  I voiced to him that the patient with assistance has been emtying her own drain since yesterday with assistance from family.  I asked her if she had someone at home that could assist her with the drainage emptying. She stated she did.    Prior to discharge I notified [facility] to let them know she was being d/c'd and to see if anything else was going to be needed for the referral.  I spoke with Denice Paradise the pharmacist and she stated she would like  The order and d/c summary to be faxed to her. I voiced to her that the patient would receive a dose of Vancomycin prior to leaving so she could get her dose tonight.  She verbalized understanding. I also spoke to [place] the RN and she stated that she needed a copy of the actual order for home health and that was all she needed.  She voiced that someone would be out to the patients house between 8 and 10 pm tonight.  I faxed the required documents to them.    I faxed per Dr. Geanie Logan a copy of the d/c summary to [facility].  Prior to discharge I discussed with the patient the importance of documenting the amount, time, and date of drainage that is being emptied from her drain.  I told her to take the information with her to the MD office.  She verbalized understanding.  Prior to leaving her dressing was checked and and it was dry and intact and the drain tube was flushed with 10cc of sterile saline.  I sent home with her the supplies that was in her room for dressing changes and showed her how to change the dressing. Which was a split gauze and tape only she understands that scissors is not to be used with dressing changes. She understands not sit in the tube for baths.  Prior to leaving the patients PICC was flushed with saline.  Pt left the floor via w/c with staff and family with instructions, carenotes, and prescriptions.  She verbalized understanding and left the floor in  stable condition.

## 2012-08-08 NOTE — Progress Notes (Signed)
Patient was arguing and cussing with her boyfriend loudly in the room with door open when i approached her and said they would have to quit down or he would have to leave she said she was going to walk around. The gentlemen began to get dressed as if to leave and then patient grabbed her purse as well to walk, when i asked why she was taking it with her she stated she didn't want him to take her money. I allowed her to walk the hall as i checked on another patients call light (room 339) and when i returned to the nurses station the secretary and other nurses informed me that she had left the floor pushing her IV pump and they had called security to escort her back because they had told her to not leave the floor and she was previously seen outside smoking during this admission.

## 2012-08-08 NOTE — Discharge Summary (Signed)
Physician Discharge Summary  [name] DOB: [birth year] DOA: 08/05/2012  PCP: [facility].  Admit date: 08/05/2012 Discharge date: 08/08/2012  Time spent: Greater than 30 minutes  Recommendations for Outpatient Follow-up:  1. Follow with primary care physician in 1 week. 2. [facility] for home intravenous vancomycin and care of drain.   Discharge Diagnoses:  1. Left psoas abscess, status post drain. Culture indicating staph aureus, probably MRSA. History of intravenous drug abuse in the past. 2. Hypertension. 3. Anxiety and depression. 4. Ongoing tobacco abuse.   Discharge Condition: Stable.  Diet recommendation: Regular.  Filed Weights   08/05/12 1137 08/05/12 1726  Weight: 74.844 kg (165 lb) 78.109 kg (172 lb 3.2 oz)    History of present illness:  This 42 year old lady presents to the hospital with symptoms of left flank pain. Please see initial history as outlined below: [name] is an 42 y.o. female who presents to the emergency room with complaints of severe left-sided flank and abdominal pain. She has been seen in the emergency room several times this week. This is the worst pain she has experienced. CT of the abdomen and pelvis shows a large left-sided psoas abscess. She has a history of polysubstance abuse including crack cocaine and IV opiate abuse, clean for about 2 months and going to outpatient drug treatment twice weekly. Last HIV test was a year ago reportedly negative. She has a history of hepatitis C. No precipitating trauma or other history of infections. The pain is still severe. She has had subjective fevers chills, occasional vomiting, poor appetite.  Hospital Course:  Patient was admitted and started on intravenous antibiotics empirically. CT scan of the abdomen and pelvis showed a left-sided psoas abscess. HIV test was negative. She went to [facility] under interventional  radiologist and underwent insertion of drain and significant pus is draining still. Culture of the possibilities to be Staphylococcus aureus, probably MRSA. Patient does have a history of intravenous drug abuse. She has anemia of chronic disease. Her white blood cell count is improving and she has had no fevers. Her sodium also has improved. She stable for discharge.  Post discharge orders: 1. Keep the amount of drainage monitored on a daily basis. When drainage is less than 20 cc, CT scan of the abdomen must be repeated before removing the drain. 2. Continue with intravenous vancomycin for 4 weeks.  Procedures:  Insertion of drain in left psoas abscess by interventional radiology.   Consultations:  None.  Discharge Exam: Filed Vitals:   08/07/12 1454 08/07/12 2203 08/08/12 0539 08/08/12 0800  BP: 137/80 95/65 119/80   Pulse: 112 91 92   Temp: 98 F (36.7 C) 98.1 F (36.7 C) 97.3 F (36.3 C) 99.4 F (37.4 C)  TempSrc: Oral Axillary Oral Oral  Resp: 18 18 18    Height:      Weight:      SpO2: 97% 90% 96%     General: She looks systemically well. She is not toxic or septic. Cardiovascular: Heart sounds are present and normal without murmurs or gallop rhythm. Respiratory: Lung fields are clear. Her abdomen is soft but still somewhat tender in the left flank area. She is alert and oriented  Discharge Instructions  Discharge Orders    Future Orders Please Complete By Expires   Diet - low sodium heart healthy      Increase activity slowly          Medication List  As of 08/08/2012 10:36 AM    STOP taking these medications         ALEVE 220 MG tablet   Generic drug: naproxen sodium      lisinopril-hydrochlorothiazide 20-12.5 MG per tablet   Commonly known as: PRINZIDE,ZESTORETIC      meloxicam 7.5 MG tablet   Commonly known as: MOBIC      metroNIDAZOLE 500 MG tablet   Commonly known as: FLAGYL      TAKE these medications         clonazePAM 1 MG tablet    Commonly known as: KLONOPIN   Take 1 mg by mouth 3 (three) times daily as needed. For anxiety      cyclobenzaprine 10 MG tablet   Commonly known as: FLEXERIL   Take 1 tablet (10 mg total) by mouth 3 (three) times daily as needed for muscle spasms.      dexamethasone 4 MG tablet   Commonly known as: DECADRON   Take 4 mg by mouth daily.      HYDROmorphone 4 MG tablet   Commonly known as: DILAUDID   Take 1 tablet (4 mg total) by mouth every 4 (four) hours as needed for pain.      multivitamin with minerals Tabs   Take 1 tablet by mouth daily.      promethazine 25 MG tablet   Commonly known as: PHENERGAN   Take 1 tablet (25 mg total) by mouth every 6 (six) hours as needed for nausea.      sennosides-docusate sodium 8.6-50 MG tablet   Commonly known as: SENOKOT-S   Take 1 tablet by mouth daily.      sodium chloride 0.9 % SOLN 500 mL with vancomycin 10 G SOLR 1,500 mg   Inject 1,500 mg into the vein every 12 (twelve) hours.           Follow-up Information    Follow up with York Pellant. [facility]. On 08/14/2012. (2:30 pm, bring all financial information)    Contact information:   [place], [place] [address] [place], [place] [address] [phone]      Follow up with [facility]. (Home Health RN, PICC line care, Wound Care, IV antibiotic and blood level monitoring)    Contact information:   [address] [facility] [place] [address] [phone]          The results of significant diagnostics from this hospitalization (including imaging, microbiology, ancillary and laboratory) are listed below for reference.    Significant Diagnostic Studies: Ct Abdomen Pelvis Wo Contrast  08/05/2012  **ADDENDUM** CREATED: 08/05/2012 13:25:03  I discussed these findings by telephone with Arthor Captain in the emergency department at 1326 hours on 08/05/2012.  **END ADDENDUM** SIGNED BY: Minerva Areola A. Molli Posey, M.D.   08/05/2012  *RADIOLOGY REPORT*  Clinical Data: Left side flank and  back pain.  CT ABDOMEN AND PELVIS WITHOUT CONTRAST  Technique:  Multidetector CT imaging of the abdomen and pelvis was performed following the standard protocol without intravenous contrast.  Comparison: 08/05/2011  Findings: Images which include the lung bases show left lower lobe atelectasis with tiny left pleural effusion.  No focal abnormalities seen in the liver or spleen on this study performed without intravenous contrast material.  Retrocrural and distal para esophageal lymph nodes are evident.  Stomach is decompressed.  The duodenum and pancreas are unremarkable.  Gallbladder and adrenal glands have normal imaging features.  No abnormalities seen in the right kidney.  A large 18 x  7 x 8 cm "mass" is identified in the left retroperitoneal space.  This is difficult to assess given the lack of intravenous contrast, but appears to be centered in the left psoas muscle displacing the left kidney anteriorly.  There are some small lymph nodes in the left para-aortic space.  Imaging through the pelvis shows no free intraperitoneal fluid.  No pelvic sidewall lymphadenopathy.  Uterus is unremarkable.  No adnexal mass.  No substantial diverticular change in the colon. Terminal ileum is normal.  Suture line at the cecal tip compatible with reported history of prior appendectomy.  Bone windows reveal no worrisome lytic or sclerotic osseous lesions.  IMPRESSION: Large left retroperitoneal "mass" the appears to arise from the left psoas muscle, this is difficult to confirm without intravenous contrast material.  This could be a psoas abscess or primary retroperitoneal neoplasm.  Repeat CT of the abdomen and pelvis with IV contrast recommended to further evaluate.  Small retrocrural and distal para esophageal lymph nodes with some small periaortic lymphadenopathy in the abdomen.  These may well be secondary to the process in the left retroperitoneal space.   Original Report Authenticated By: Kennith Center, M.D.    Dg  Lumbar Spine Complete  07/28/2012  *RADIOLOGY REPORT*  Clinical Data: 42 year old female with low back pain.  LUMBAR SPINE - COMPLETE 4+ VIEW  Comparison: 08/05/2011 abdominal/pelvic CT.  Findings: Five non-rib bearing lumbar type vertebra are identified.  There is no evidence of fracture or subluxation.  Very mild multilevel degenerative disc disease is noted. There is no evidence of focal bony lesion or spondylolysis.  IMPRESSION: Very mild multilevel degenerative disc disease without other significant abnormality.   Original Report Authenticated By: Harmon Pier, M.D.    Ct Guided Abscess Drain  08/07/2012  *RADIOLOGY REPORT*  Clinical Data/Indication: LEFT RETROPERITONEAL ABSCESS  CT GUIDED ABCESS DRAINAGE WITH CATHETER  Sedation: Versed three mg, Fentanyl 100 mg.  Total Moderate Sedation Time: 20 minutes.  Procedure: The procedure, risks, benefits, and alternatives were explained to the patient. Questions regarding the procedure were encouraged and answered. The patient understands and consents to the procedure.  The left flank was prepped with betadine in a sterile fashion, and a sterile drape was applied covering the operative field. A sterile gown and sterile gloves were used for the procedure.  Under CT guidance, an 18 gauge needle was inserted into the left retroperitoneal abscess and removed over an Amplatz.  A 12-French dilator followed by 12-French drain were inserted.  It was looped and string fixed in the fluid collection.  Frank pus was aspirated and sent for culture.  Findings: Imaging documents drain placement into a left retroperitoneal abscess.  Complications: None.  IMPRESSION: Successful left retroperitoneal 12-French abscess drain yielding pus.   Original Report Authenticated By: Jolaine Click, M.D.    Ct Abdomen Pelvis W Contrast  08/05/2012  *RADIOLOGY REPORT*  Clinical Data: Retroperitoneal mass  CT ABDOMEN AND PELVIS WITH CONTRAST  Technique:  Multidetector CT imaging of the abdomen and  pelvis was performed following the standard protocol during bolus administration of intravenous contrast.  Contrast: 50mL OMNIPAQUE IOHEXOL 300 MG/ML  SOLN, OMNIPAQUE IOHEXOL 300 MG/ML  SOLN  Comparison: Earlier today  Findings: There is a small left pleural effusion.  Atelectasis is noted within both lung bases.  There is a focal area of low attenuation within the medial right hepatic lobe, image 25.  This measures 1.3 x 2.0 cm.  The remaining portions of the liver are normal.  Normal  appearance of the gallbladder.  The pancreas is within normal limits.  The spleen is normal.  Normal appearance of both adrenal glands.  The right kidney appears normal.  There is a large left sided retroperitoneal mass measuring 8.6 x 8.6 x 12.7 cm.  This appears to be centered within the left psoas muscle.  There is significant mass effect upon the posterior aspect of the left kidney.  The internal portions of this "mass" do not exhibit any enhancement.  There is a small amount of fat stranding within the surrounding retroperitoneal space.  The urinary bladder is normal.  Uterus has a normal appearance. Cyst within the left ovary is identified measuring the 2.3 cm, image 76.  Normal appearance of the aorta.  No aneurysm.  Multiple prominent retroperitoneal lymph nodes are identified.  Index periaortic node measures 1.2 cm, image 40.  Adjacent periaortic lymph node measures 1.1 cm, image 38.  No pelvic or inguinal adenopathy.  The stomach appears normal.  The small bowel loops are within normal limits. Previous appendectomy.  Normal appearance of the colon.  Review of the visualized osseous structures shows no aggressive lytic or sclerotic bone lesions.  IMPRESSION:  1.  Large "mass" within the left retroperitoneal space appears to be centered within the psoas muscle.  Although peripherally enhancing, the central, linear density portions of this structure do not enhance.  Findings are favored to represent a large psoas abscess.   Less likely would be a tumor. A percutaneous drainage and/or tissue sampling is advised.  2.  Multiple prominent retroperitoneal lymph nodes within the upper abdomen.  These are favored to be reactive in etiology.  Metastatic adenopathy is considered less favored but not excluded.  Attention on follow-up imaging is advised. 3.  Nonenhancing low attenuation structure within the medial right hepatic lobe is nonspecific but is favored to be a benign etiology. 4.  Left pleural effusion.   Original Report Authenticated By: Signa Kell, M.D.    Dg Chest Port 1 View  08/06/2012  *RADIOLOGY REPORT*  Clinical Data: Confirm line placement  PORTABLE CHEST - 1 VIEW  Comparison: 07/11/2010  Findings: Mild left basilar atelectasis.  Lungs otherwise clear. No pleural effusion or pneumothorax.  Cardiomediastinal silhouette is within normal limits.  Right arm PICC terminates at cavoatrial junction.  IMPRESSION: Right arm PICC terminates at the cavoatrial junction.   Original Report Authenticated By: Charline Bills, M.D.     Microbiology: Recent Results (from the past 240 hour(s))  CULTURE, BLOOD (ROUTINE X 2)     Status: Normal (Preliminary result)   Collection Time   08/05/12  2:21 PM      Component Value Range Status Comment   Specimen Description BLOOD LEFT FOREARM DRAWN BY RN   Final    Special Requests BOTTLES DRAWN AEROBIC ONLY 6CC   Final    Culture NO GROWTH 3 DAYS   Final    Report Status PENDING   Incomplete   CULTURE, BLOOD (ROUTINE X 2)     Status: Normal (Preliminary result)   Collection Time   08/05/12  2:21 PM      Component Value Range Status Comment   Specimen Description BLOOD RIGHT ANTECUBITAL   Final    Special Requests BOTTLES DRAWN AEROBIC AND ANAEROBIC 6CC   Final    Culture NO GROWTH 3 DAYS   Final    Report Status PENDING   Incomplete   CULTURE, ROUTINE-ABSCESS     Status: Normal (Preliminary result)   Collection Time  08/06/12  6:18 PM      Component Value Range Status Comment     Specimen Description ABSCESS PERITONEAL CAVITY   Final    Special Requests NONE   Final    Gram Stain     Final    Value: FEW WBC PRESENT,BOTH PMN AND MONONUCLEAR     NO SQUAMOUS EPITHELIAL CELLS SEEN     MODERATE GRAM POSITIVE COCCI IN CLUSTERS     IN PAIRS   Culture     Final    Value: MODERATE STAPHYLOCOCCUS AUREUS     Note: RIFAMPIN AND GENTAMICIN SHOULD NOT BE USED AS SINGLE DRUGS FOR TREATMENT OF STAPH INFECTIONS.   Report Status PENDING   Incomplete      Labs: Basic Metabolic Panel:  Lab 08/08/12 8295 08/07/12 0641 08/06/12 0401 08/05/12 1150  NA 134* 125* 129* 129*  K 4.5 3.9 4.0 4.4  CL 99 92* 94* 94*  CO2 27 24 23 24   GLUCOSE 101* 183* 109* 78  BUN 19 12 11 18   CREATININE 0.96 0.83 0.57 0.57  CALCIUM 9.2 8.8 8.7 9.2  MG -- -- -- --  PHOS -- -- -- --   Liver Function Tests:  Lab 08/07/12 0641 08/05/12 1948  AST 7 9  ALT 6 8  ALKPHOS 55 62  BILITOT 0.2* 0.4  PROT 6.6 7.6  ALBUMIN 2.1* 2.6*     CBC:  Lab 08/08/12 0430 08/07/12 0641 08/05/12 1150  WBC 14.3* 22.5* 26.2*  NEUTROABS -- -- 20.4*  HGB 8.0* 8.2* 11.0*  HCT 25.1* 24.6* 33.3*  MCV 89.3 88.5 88.3  PLT 400 329 512*         Signed:  Kevan Prouty C  [facility] 08/08/2012, 10:36 AM

## 2012-08-09 LAB — CULTURE, ROUTINE-ABSCESS

## 2012-08-09 NOTE — ED Provider Notes (Signed)
Medical screening examination/treatment/procedure(s) were conducted as a shared visit with non-physician practitioner(s) and myself.  I personally evaluated the patient during the encounter   Shelda Jakes, MD 08/09/12 1421

## 2012-08-10 LAB — CULTURE, BLOOD (ROUTINE X 2): Culture: NO GROWTH

## 2012-08-19 ENCOUNTER — Encounter (HOSPITAL_COMMUNITY): Payer: Medicaid Other

## 2012-08-19 ENCOUNTER — Emergency Department (HOSPITAL_COMMUNITY)
Admission: EM | Admit: 2012-08-19 | Discharge: 2012-08-19 | Disposition: A | Discharge status: Home / Self Care | Payer: Medicaid Other | Attending: Emergency Medicine | Admitting: Emergency Medicine

## 2012-08-19 ENCOUNTER — Emergency Department (HOSPITAL_COMMUNITY): Payer: Medicaid Other

## 2012-08-19 ENCOUNTER — Encounter (HOSPITAL_COMMUNITY): Payer: Self-pay | Admitting: Emergency Medicine

## 2012-08-19 DIAGNOSIS — T82598A Other mechanical complication of other cardiac and vascular devices and implants, initial encounter: Secondary | ICD-10-CM | POA: Insufficient documentation

## 2012-08-19 DIAGNOSIS — F1410 Cocaine abuse, uncomplicated: Secondary | ICD-10-CM | POA: Insufficient documentation

## 2012-08-19 DIAGNOSIS — F329 Major depressive disorder, single episode, unspecified: Secondary | ICD-10-CM | POA: Insufficient documentation

## 2012-08-19 DIAGNOSIS — I10 Essential (primary) hypertension: Secondary | ICD-10-CM | POA: Insufficient documentation

## 2012-08-19 DIAGNOSIS — Y849 Medical procedure, unspecified as the cause of abnormal reaction of the patient, or of later complication, without mention of misadventure at the time of the procedure: Secondary | ICD-10-CM | POA: Insufficient documentation

## 2012-08-19 DIAGNOSIS — Z8619 Personal history of other infectious and parasitic diseases: Secondary | ICD-10-CM | POA: Insufficient documentation

## 2012-08-19 DIAGNOSIS — Z79899 Other long term (current) drug therapy: Secondary | ICD-10-CM | POA: Insufficient documentation

## 2012-08-19 DIAGNOSIS — T82898A Other specified complication of vascular prosthetic devices, implants and grafts, initial encounter: Secondary | ICD-10-CM

## 2012-08-19 DIAGNOSIS — Z8541 Personal history of malignant neoplasm of cervix uteri: Secondary | ICD-10-CM | POA: Insufficient documentation

## 2012-08-19 DIAGNOSIS — F3289 Other specified depressive episodes: Secondary | ICD-10-CM | POA: Insufficient documentation

## 2012-08-19 DIAGNOSIS — F17200 Nicotine dependence, unspecified, uncomplicated: Secondary | ICD-10-CM | POA: Insufficient documentation

## 2012-08-19 DIAGNOSIS — Z87442 Personal history of urinary calculi: Secondary | ICD-10-CM | POA: Insufficient documentation

## 2012-08-19 MED ORDER — ALTEPLASE 100 MG IV SOLR
2.0000 mg | Freq: Once | INTRAVENOUS | Status: DC
  Administered 2012-08-19: 2 mg
  Filled 2012-08-19 (×2): qty 2

## 2012-08-19 MED ORDER — OXYCODONE-ACETAMINOPHEN 5-325 MG PO TABS
1.0000 | ORAL_TABLET | Freq: Once | ORAL | Status: AC
  Administered 2012-08-19: 1 via ORAL
  Filled 2012-08-19: qty 1

## 2012-08-19 MED ORDER — ALTEPLASE 2 MG IJ SOLR
2.0000 mg | Freq: Once | INTRAMUSCULAR | Status: DC
  Filled 2012-08-19: qty 2

## 2012-08-19 NOTE — ED Notes (Signed)
Pt c/o back pain, notified PCP, Percocet given.

## 2012-08-19 NOTE — ED Notes (Signed)
Pt has r arm PICC line due to mass to abd that has had surgery. Receiving antibiotics through PICC. Home health nurse in yesterday and showed pt how to give self meds. Pt states did it last night and fluids ran slow. Tried it this am and meds "would not drip at all". Denies pain to site or denies it looking different. No s/s of infection noted. Nad.

## 2012-08-19 NOTE — ED Provider Notes (Signed)
History  This chart was scribed for Ward Givens, MD by Bennett Scrape, ED Scribe. This patient was seen in room APA01/APA01 and the patient's care was started at 1:59 PM.  CSN: [id number]  Arrival date & time 08/19/12  1332   First MD Initiated Contact with Patient 08/19/12 1359      Chief Complaint  Patient presents with  . Arm Pain    The history is provided by the patient and a parent. No language interpreter was used.    [name] is a 42 y.o. female who presents to the Emergency Department complaining of a PICC line problem in her right arm. Pt states that she was d/c from hospital 2 weeks ago for an abscess in her back and is taking IV vancomycin through a PICC line in her right arm. She states that her mother hooks up her own antibiotics and denies having a problems prior to yesterday. She states that the flush went in with more force than normal and the IV would only drip. Mother states that the pt did not get all of her medicine yesterday morning or last night and did not get any antibiotics this morning. She takes 2 bags of the vancomycin twice a day. Pt is projected to be on the antibiotics at least another week per home healthcare nurse. She denies having any instructions on f/u. She reports fatigue but denies fevers, chills, nausea and emesis as associated symptoms. She is a current everyday smoker (1ppd) but denies alcohol use. Last cocaine use was one month ago.   Pt lives with mother and is currently unemployed. She denies being on disability.  Pt is seen at [facility].  Past Medical History  Diagnosis Date  . Hypertension   . Depressed   . Drug addiction Hep C  . Cancer   . Cervical cancer   . Renal disorder     kidney stones  . Hepatitis   IV drug abuse  Past Surgical History  Procedure Date  . Appendectomy   . Tubal ligation   . Mandible surgery for fracture     History reviewed. No pertinent family history.  History  Substance  Use Topics  . Smoking status: Current Every Day Smoker -- 1.0 packs/day    Types: Cigarettes  . Smokeless tobacco: Not on file  . Alcohol Use: No  unemployed Lives with mother  No OB history provided.  Review of Systems  Constitutional: Negative for fever and chills.  Gastrointestinal: Negative for nausea, vomiting and abdominal pain.  Musculoskeletal: Negative for myalgias.  All other systems reviewed and are negative.    Allergies  Ketorolac tromethamine; Tramadol hcl; and Propoxyphene napsylate  Home Medications   Current Outpatient Rx  Name  Route  Sig  Dispense  Refill  . CLONAZEPAM 1 MG PO TABS   Oral   Take 1 mg by mouth 3 (three) times daily as needed. For anxiety         . CYCLOBENZAPRINE HCL 10 MG PO TABS   Oral   Take 1 tablet (10 mg total) by mouth 3 (three) times daily as needed for muscle spasms.   21 tablet   0   . LISINOPRIL-HYDROCHLOROTHIAZIDE 10-12.5 MG PO TABS   Oral   Take 1 tablet by mouth daily.         . ADULT MULTIVITAMIN W/MINERALS CH   Oral   Take 1 tablet by mouth daily.         Marland Kitchen  SENNA-DOCUSATE SODIUM 8.6-50 MG PO TABS   Oral   Take 1 tablet by mouth daily.   30 tablet   0   . VANCOMYCIN IVPB   Intravenous   Inject 1,500 mg into the vein every 12 (twelve) hours.           Triage Vitals: BP 153/109  Pulse 96  Temp 98.3 F (36.8 C) (Oral)  Resp 20  Ht 5\' 2"  (1.575 m)  Wt 165 lb (74.844 kg)  BMI 30.18 kg/m2  SpO2 100%  LMP 08/04/2012  Vital signs normal except hypertension   Physical Exam  Nursing note and vitals reviewed. Constitutional: She is oriented to person, place, and time. She appears well-developed and well-nourished.  Non-toxic appearance. She does not appear ill. No distress.  HENT:  Head: Normocephalic and atraumatic.  Right Ear: External ear normal.  Left Ear: External ear normal.  Nose: Nose normal. No mucosal edema or rhinorrhea.  Mouth/Throat: Oropharynx is clear and moist and mucous  membranes are normal. No dental abscesses or uvula swelling.  Eyes: Conjunctivae normal and EOM are normal. Pupils are equal, round, and reactive to light.  Neck: Normal range of motion and full passive range of motion without pain. Neck supple.  Cardiovascular: Normal rate, regular rhythm and normal heart sounds.  Exam reveals no gallop and no friction rub.   No murmur heard. Pulmonary/Chest: Effort normal and breath sounds normal. No respiratory distress. She has no wheezes. She has no rhonchi. She has no rales. She exhibits no tenderness and no crepitus.  Abdominal: Soft. Normal appearance and bowel sounds are normal. She exhibits no distension. There is no tenderness. There is no rebound and no guarding.  Musculoskeletal: Normal range of motion. She exhibits no edema and no tenderness.       PICC has a small area erythema where the line inserts into the skin in the right upper arm, no induration, edema or drainage, it is in good location, does not appear to be displaced  Neurological: She is alert and oriented to person, place, and time. She has normal strength. No cranial nerve deficit.  Skin: Skin is warm, dry and intact. No rash noted. No erythema. No pallor.  Psychiatric: She has a normal mood and affect. Her speech is normal and behavior is normal. Her mood appears not anxious.    ED Course  Procedures (including critical care time)   Medications  alteplase (CATHFLO ACTIVASE) injection 2 mg (not administered)  oxyCODONE-acetaminophen (PERCOCET/ROXICET) 5-325 MG per tablet 1 tablet (1 tablet Oral Given 08/19/12 1515)     DIAGNOSTIC STUDIES: Oxygen Saturation is 100% on room air, normal by my interpretation.    COORDINATION OF CARE: 2:27 PM-Discussed treatment plan which includes CXR with pt at bedside and pt agreed to plan.   Prior admission reviewed, patient had a psoasis muscle abscess that was drained percutaneously by radiology with a drain left in place. Pt has hx of IV drug  use. It is clearing documented she is to f/u at [facility], although patient states she wasn't given any information on who to follow up with and denied to me she even had a doctor when asked several times then stated she goes to [facility].   Our ED nurse tried to use the PICC and said it was very hard to flush. IT was checked by the radiology nurse and had tPA put in line. Meanwhile peripheral IV started and pt given her own vancomycin that she brought with  her to the ED.   17:30 the picc now flushes easily. Pt discharged hom.    Dg Chest 2 View  08/19/2012  *RADIOLOGY REPORT*  Clinical Data: Arm pain.  CHEST - 2 VIEW  Comparison: August 06, 2012.  Findings: Cardiomediastinal silhouette appears normal.  No acute pulmonary disease is noted.  Bony thorax is intact.  Right-sided PICC line is unchanged in position.  IMPRESSION: No acute cardiopulmonary abnormality seen.   Original Report Authenticated By: Lupita Raider.,  M.D.      1. Occluded PICC line     Plan discharge  Devoria Albe, MD, FACEP   MDM   I personally performed the services described in this documentation, which was scribed in my presence. The recorded information has been reviewed and considered.  Devoria Albe, MD, Armando Gang        Ward Givens, MD 08/19/12 1740

## 2012-08-19 NOTE — ED Notes (Signed)
Peripheral IV started and pt's vancomycin from home infusing as ordered by Dr. Lynelle Doctor.

## 2012-08-19 NOTE — ED Notes (Signed)
Per Fabian November, Nursing supervisor, PICC line cath flo activase removed without difficulty.  Flushed with NSS 10ml x 2 easily.  No problems.  Notified Dr. Lynelle Doctor.

## 2012-08-19 NOTE — ED Notes (Signed)
Dr. Lynelle Doctor aware of bp.

## 2012-08-19 NOTE — ED Notes (Signed)
Pt c/o pain in r arm with flushing PICC.  Notified Victorino Dike, PICC RN and she will come look at site.  Also was instructed to order chest x ray and if new picc needed, have pt scheduled for procedure tomorrow morning.  Notified Dr. Lynelle Doctor and order placed.

## 2012-08-19 NOTE — ED Notes (Signed)
Nursing supervisor coming to assess picc.

## 2012-08-24 ENCOUNTER — Encounter (HOSPITAL_COMMUNITY): Payer: Self-pay | Admitting: *Deleted

## 2012-08-24 ENCOUNTER — Emergency Department (HOSPITAL_COMMUNITY): Payer: Medicaid Other

## 2012-08-24 ENCOUNTER — Emergency Department (HOSPITAL_COMMUNITY)
Admission: EM | Admit: 2012-08-24 | Discharge: 2012-08-24 | Disposition: A | Discharge status: Home / Self Care | Payer: Medicaid Other | Attending: Emergency Medicine | Admitting: Emergency Medicine

## 2012-08-24 DIAGNOSIS — Z8719 Personal history of other diseases of the digestive system: Secondary | ICD-10-CM | POA: Insufficient documentation

## 2012-08-24 DIAGNOSIS — Z87442 Personal history of urinary calculi: Secondary | ICD-10-CM | POA: Insufficient documentation

## 2012-08-24 DIAGNOSIS — F17200 Nicotine dependence, unspecified, uncomplicated: Secondary | ICD-10-CM | POA: Insufficient documentation

## 2012-08-24 DIAGNOSIS — Z8781 Personal history of (healed) traumatic fracture: Secondary | ICD-10-CM | POA: Insufficient documentation

## 2012-08-24 DIAGNOSIS — Z9851 Tubal ligation status: Secondary | ICD-10-CM | POA: Insufficient documentation

## 2012-08-24 DIAGNOSIS — F1410 Cocaine abuse, uncomplicated: Secondary | ICD-10-CM | POA: Insufficient documentation

## 2012-08-24 DIAGNOSIS — Z4889 Encounter for other specified surgical aftercare: Secondary | ICD-10-CM | POA: Insufficient documentation

## 2012-08-24 DIAGNOSIS — Z9089 Acquired absence of other organs: Secondary | ICD-10-CM | POA: Insufficient documentation

## 2012-08-24 DIAGNOSIS — I10 Essential (primary) hypertension: Secondary | ICD-10-CM | POA: Insufficient documentation

## 2012-08-24 DIAGNOSIS — F329 Major depressive disorder, single episode, unspecified: Secondary | ICD-10-CM | POA: Insufficient documentation

## 2012-08-24 DIAGNOSIS — Z8541 Personal history of malignant neoplasm of cervix uteri: Secondary | ICD-10-CM | POA: Insufficient documentation

## 2012-08-24 DIAGNOSIS — K6812 Psoas muscle abscess: Secondary | ICD-10-CM | POA: Insufficient documentation

## 2012-08-24 DIAGNOSIS — F3289 Other specified depressive episodes: Secondary | ICD-10-CM | POA: Insufficient documentation

## 2012-08-24 DIAGNOSIS — Z79899 Other long term (current) drug therapy: Secondary | ICD-10-CM | POA: Insufficient documentation

## 2012-08-24 DIAGNOSIS — Z4803 Encounter for change or removal of drains: Secondary | ICD-10-CM

## 2012-08-24 MED ORDER — IOHEXOL 300 MG/ML  SOLN
100.0000 mL | Freq: Once | INTRAMUSCULAR | Status: AC | PRN
  Administered 2012-08-24: 100 mL via INTRAVENOUS

## 2012-08-24 MED ORDER — SODIUM CHLORIDE 0.9 % IV SOLN
INTRAVENOUS | Status: DC
  Administered 2012-08-24: 16:00:00 via INTRAVENOUS

## 2012-08-24 NOTE — ED Notes (Signed)
Patient upset with Dr Delford Field plan of care. Patient and patient's mother wanting a "specialist" to be called in to take the drain out. Patient and family informed that the ED does not take these drains out. Patient and family does not know the doctor in which to follow up with and are insistent that follow up care was not provided. Dr Lynelle Doctor consulting case management to find out who patient is to follow up with. Patient tearful.

## 2012-08-24 NOTE — ED Notes (Signed)
MD at bedside. 

## 2012-08-24 NOTE — Progress Notes (Signed)
Patient:  [name], [name]   Account Number:  [id number]  Date Initiated:  08/24/2012  Documentation initiated by:  Rosemary Holms  Subjective/Objective Assessment:   Pt being seen in ER. Called by ED and CM spoke to Dr. Lynelle Doctor. Pt states she is confused about what she is there for. CM spoke to [facility] who is doing home AB and PICC line care. Per Abby with [facility], Pt to come to ED to have drain removed. This was     Action/Plan:   per her PCP. Dr. Lynelle Doctor given [facility]'s contact number to verify.   Anticipated DC Date:  08/24/2012   Anticipated DC Plan:  HOME W HOME HEALTH SERVICES      DC Planning Services  CM consult      Choice offered to / List presented to:             Status of service:  Completed, signed off Medicare Important Message given?   (If response is "NO", the following Medicare IM given date fields will be blank) Date Medicare IM given:   Date Additional Medicare IM given:    Discharge Disposition:    Per UR Regulation:    If discussed at Long Length of Stay Meetings, dates discussed:    Comments:  08/24/12 Rosemary Holms RN BSN CM

## 2012-08-24 NOTE — ED Provider Notes (Signed)
History   This chart was scribed for Ward Givens, MD by Charolett Bumpers, ED Scribe. The patient was seen in room APAH8/APAH8. Patient's care was started at 1501.   CSN: [id number]  Arrival date & time 08/24/12  1352   First MD Initiated Contact with Patient 08/24/12 1501      Chief Complaint  Patient presents with  . Flank Pain    The history is provided by the patient. No language interpreter was used.  [name] is a 42 y.o. female who presents to the Emergency Department complaining of a drainage tube in her left flank. She states that it has been almost 2 weeks since she has had drainage from the tube and wants it removed. She is suppose to f/u with [facility] and is serviced by [facility] but doesn't know who to f/u with and states that she was not d/c with instructions. She denies any fevers, chills, nausea, vomiting. She has a PICC in place in her RUE and states that she has 2 more weeks of IV vancomycin. She was originally seen for a psoasis muscle abscess that was drained percutaneously by radiologist with a drain left in place.   PCP [facility]   Past Medical History  Diagnosis Date  . Hypertension   . Depressed   . Drug addiction Hep C  . Hepatitis   . Cancer   . Cervical cancer   . Renal disorder     kidney stones    Past Surgical History  Procedure Date  . Appendectomy   . Tubal ligation   . Mandible surgery for fracture     History reviewed. No pertinent family history.  History  Substance Use Topics  . Smoking status: Current Every Day Smoker -- 1.0 packs/day    Types: Cigarettes  . Smokeless tobacco: Not on file  . Alcohol Use: No  lives with MOP IV drug abuser  OB History    Grav Para Term Preterm Abortions TAB SAB Ect Mult Living                  Review of Systems  All other systems reviewed and are negative.    Allergies  Ketorolac tromethamine; Tramadol hcl; and Propoxyphene  napsylate  Home Medications   Current Outpatient Rx  Name  Route  Sig  Dispense  Refill  . CLONAZEPAM 1 MG PO TABS   Oral   Take 1 mg by mouth 3 (three) times daily as needed. For anxiety         . CYCLOBENZAPRINE HCL 10 MG PO TABS   Oral   Take 1 tablet (10 mg total) by mouth 3 (three) times daily as needed for muscle spasms.   21 tablet   0   . LISINOPRIL-HYDROCHLOROTHIAZIDE 10-12.5 MG PO TABS   Oral   Take 1 tablet by mouth daily.         . ADULT MULTIVITAMIN W/MINERALS CH   Oral   Take 1 tablet by mouth daily.         . SENNA-DOCUSATE SODIUM 8.6-50 MG PO TABS   Oral   Take 1 tablet by mouth daily.   30 tablet   0   . VANCOMYCIN IVPB   Intravenous   Inject 1,500 mg into the vein every 12 (twelve) hours.           Triage Vitals: BP 133/105  Pulse 99  Temp 97.5 F (36.4 C) (Oral)  Resp  20  Ht 5\' 2"  (1.575 m)  Wt 165 lb (74.844 kg)  BMI 30.18 kg/m2  SpO2 98%  LMP 08/04/2012  Vital signs normal    Physical Exam  Nursing note and vitals reviewed. Constitutional: She is oriented to person, place, and time. She appears well-developed and well-nourished. No distress.  HENT:  Head: Normocephalic and atraumatic.  Right Ear: External ear normal.  Left Ear: External ear normal.  Nose: Nose normal.  Mouth/Throat: Oropharynx is clear and moist. No oropharyngeal exudate.  Eyes: Conjunctivae normal and EOM are normal. Pupils are equal, round, and reactive to light.  Neck: Normal range of motion. Neck supple. No tracheal deviation present.  Cardiovascular: Normal rate, regular rhythm and normal heart sounds.  Exam reveals no gallop.   No murmur heard. Pulmonary/Chest: Effort normal and breath sounds normal. No respiratory distress. She has no wheezes. She has no rales.  Abdominal: Soft. Bowel sounds are normal. She exhibits no distension. There is no tenderness. There is no rebound and no guarding.  Musculoskeletal: Normal range of motion. She exhibits no  edema and no tenderness.       Drain seen in her left flank.  Neurological: She is alert and oriented to person, place, and time.  Skin: Skin is warm and dry.  Psychiatric: She has a normal mood and affect. Her behavior is normal.    ED Course  Procedures (including critical care time)   Medications  0.9 %  sodium chloride infusion (  Intravenous New Bag/Given 08/24/12 1623)  iohexol (OMNIPAQUE) 300 MG/ML solution 100 mL (100 mL Intravenous Contrast Given 08/24/12 1657)     DIAGNOSTIC STUDIES: Oxygen Saturation is 98% on room air, normal by my interpretation.    COORDINATION OF CARE:  15:50-Discussed planned course of treatment with the patient including consulting case management, who is agreeable at this time.   Review of prior records shows she was admitted January 15 through the 18th for a psoas muscle abscess and she was discharged with a PICC line to receive IV vancomycin for 4-6 weeks. It was discussed that after her drainage from the tube was less than 20 cc she was supposed to have a repeat CT scan to see if the drain could be removed. She also had a discharge appointment to be seen at [facility] on January 24 at 2:30 PM. Patient states they won't see her. Patient states she had no discharge instructions and doesn't know what to do.  Review of her prior visits shows her drain was placed by Dr Bonnielee Haff, IR.  15:57-Recheck: Spoke with Dr Suzanna Obey  who reviewed her prior scans, he wants a repeat CT scan to see if the abscess has resolved. He will then discuss with interventionalist to see if tube is ready to be pulled.    16:38 Amy Leanord Hawking who was her case manager in the hospital was contacted, she will [facility]. Pt had an appt on 1/24 at 2:30 pm when she was discharged.  16:49 Amy called back, she has spoken to [facility] who saw the patient today and they were told by the patient that she was to come to the ED to  get her drain removed.  16:50 I spoke to [facility], she did have a appointment on 1/24 with their office. She was given wound instructions and to f/u with the surgeon who put in the drain. She told them she had appointments with the surgeon. To return  as needed for f/u.  States she wanted pain medications given dilaudid 4 mg #24 with no refills. States they have her discharge summary. Also state they will not treat chronic pain and that patient has a large bill.   18:01 Dr Tyron Russell will look at her scan and call me back.  18:05 Dr Tyron Russell is going to pull her drain.   Pt states she gave a UA sample but was never sent to the lab after ordered.  Ct Abdomen Pelvis W Contrast  08/24/2012  *RADIOLOGY REPORT*  Clinical Data: Left psoas muscle drainage 08/05/2012.  The patient states no longer draining and would like removed.  History of hepatitis C and hypertension.  Prior appendectomy and tubal ligation.  CT ABDOMEN AND PELVIS WITH CONTRAST  Technique:  Multidetector CT imaging of the abdomen and pelvis was performed following the standard protocol during bolus administration of intravenous contrast.  Contrast: OMNIPAQUE IOHEXOL 300 MG/ML  SOLN  Comparison: 08/06/2012 and 08/05/2012.  Findings: Left psoas muscle drainage catheter in place with almost complete resolution of the previously noted left psoas muscle abscess.  What remains may represent residual infection or reaction to infection.  Decreased displacement of the left kidney.  Very minimal haziness of the left kidney may be normal.  Pyelonephritis not entirely excluded in the proper clinical setting.  There remain scattered increased number of normal size to slightly enlarged lymph nodes retroperitoneal and pelvic region.  It is possible these lymph nodes are reactive in origin rather than related to malignancy.  Fatty infiltration of the liver without worrisome focal mass.  No worrisome focal splenic, pancreatic, adrenal or right renal  mass.  No clear extension of left psoas inflammatory process into the spinal canal.  If this were of concern,  MR would then need to be obtained for further delineation.  Small hiatal hernia.  No extraluminal bowel inflammatory process or free air.  No calcified gallstones.  Slightly advanced atherosclerotic type changes with mild plaques/calcification of the aortic bifurcation.  IMPRESSION: Left psoas muscle drainage catheter in place with almost complete resolution of the previously noted left psoas muscle abscess.  What remains may represent residual infection or reaction to infection. Decreased displacement of the left kidney.  Very minimal haziness of the left kidney may be normal.  Pyelonephritis not entirely excluded in the proper clinical setting.  There remain scattered increased number of normal size to slightly enlarged lymph nodes retroperitoneal and pelvic region.  It is possible these lymph nodes are reactive in origin rather than related to malignancy.  Please see above.   Original Report Authenticated By: Lacy Duverney, M.D.       1. Change or removal of drains   2. Psoas muscle abscess     Plan discharge  Devoria Albe, MD, FACEP   MDM   I personally performed the services described in this documentation, which was scribed in my presence. The recorded information has been reviewed and considered.  Devoria Albe, MD, FACEP      Ward Givens, MD 08/24/12 [phone]

## 2012-08-24 NOTE — ED Notes (Signed)
Picc line flushed when fluids were D/C'd.

## 2012-08-24 NOTE — ED Notes (Signed)
Patient requesting pain medication. Dr Lynelle Doctor aware.

## 2012-08-24 NOTE — ED Notes (Signed)
Pt has a drain in lt flank, that she says is supposed to be removed Has a PICC in rt upper arm.

## 2012-08-24 NOTE — Progress Notes (Signed)
Patient ID: [name], female   DOB: [birth year], 42 y.o.   MRN: [id number]  Discussed patient with Dr. Devoria Albe.  Patient has had resolution of the abscess collection in the left psoas muscle since prior CT post percutaneous drainage. No output from pigtail drainage catheter at prior abscess collection for past week. Catheter was therefore removed without difficulty. No drainage from site after catheter removal. Routine dressing placed and instructions for replacement of dressing verbally given to patient, who demonstrated an understanding. Catheter removal tolerated well by patient.  Karim Aiello A. Tyron Russell, M.D.

## 2012-08-25 NOTE — Care Management Note (Addendum)
    Page 1 of 1   08/24/2012     5:00:36 PM   CARE MANAGEMENT NOTE 08/24/2012  Patient:  HIILEI, GERST   Account Number:  [id number]  Date Initiated:  08/24/2012  Documentation initiated by:  Rosemary Holms  Subjective/Objective Assessment:   Pt being seen in ER. Called by ED and CM spoke to Dr. Lynelle Doctor. Pt states she is confused about what she is there for. CM spoke to [facility] who is doing home AB and PICC line care. Per Abby with [facility], Pt to come to ED to have drain removed. This was     Action/Plan:   per her PCP. Dr. Lynelle Doctor given [facility]'s contact number to verify.   Anticipated DC Date:  08/24/2012   Anticipated DC Plan:  HOME W HOME HEALTH SERVICES      DC Planning Services  CM consult      Choice offered to / List presented to:             Status of service:  Completed, signed off Medicare Important Message given?   (If response is "NO", the following Medicare IM given date fields will be blank) Date Medicare IM given:   Date Additional Medicare IM given:    Discharge Disposition:    Per UR Regulation:    If discussed at Long Length of Stay Meetings, dates discussed:    Comments:  08/24/12 Rosemary Holms RN BSN CM

## 2012-09-02 ENCOUNTER — Encounter (HOSPITAL_COMMUNITY): Payer: Self-pay

## 2012-09-02 ENCOUNTER — Emergency Department (HOSPITAL_COMMUNITY)
Admission: EM | Admit: 2012-09-02 | Discharge: 2012-09-02 | Discharge status: Against Medical Advice | Payer: Medicaid Other | Attending: Emergency Medicine | Admitting: Emergency Medicine

## 2012-09-02 DIAGNOSIS — F329 Major depressive disorder, single episode, unspecified: Secondary | ICD-10-CM | POA: Insufficient documentation

## 2012-09-02 DIAGNOSIS — F1410 Cocaine abuse, uncomplicated: Secondary | ICD-10-CM | POA: Insufficient documentation

## 2012-09-02 DIAGNOSIS — R509 Fever, unspecified: Secondary | ICD-10-CM | POA: Insufficient documentation

## 2012-09-02 DIAGNOSIS — Z792 Long term (current) use of antibiotics: Secondary | ICD-10-CM | POA: Insufficient documentation

## 2012-09-02 DIAGNOSIS — Z872 Personal history of diseases of the skin and subcutaneous tissue: Secondary | ICD-10-CM | POA: Insufficient documentation

## 2012-09-02 DIAGNOSIS — Z8781 Personal history of (healed) traumatic fracture: Secondary | ICD-10-CM | POA: Insufficient documentation

## 2012-09-02 DIAGNOSIS — F3289 Other specified depressive episodes: Secondary | ICD-10-CM | POA: Insufficient documentation

## 2012-09-02 DIAGNOSIS — F17200 Nicotine dependence, unspecified, uncomplicated: Secondary | ICD-10-CM | POA: Insufficient documentation

## 2012-09-02 DIAGNOSIS — Z9851 Tubal ligation status: Secondary | ICD-10-CM | POA: Insufficient documentation

## 2012-09-02 DIAGNOSIS — Z8541 Personal history of malignant neoplasm of cervix uteri: Secondary | ICD-10-CM | POA: Insufficient documentation

## 2012-09-02 DIAGNOSIS — R109 Unspecified abdominal pain: Secondary | ICD-10-CM | POA: Insufficient documentation

## 2012-09-02 DIAGNOSIS — Z79899 Other long term (current) drug therapy: Secondary | ICD-10-CM | POA: Insufficient documentation

## 2012-09-02 DIAGNOSIS — Z87442 Personal history of urinary calculi: Secondary | ICD-10-CM | POA: Insufficient documentation

## 2012-09-02 DIAGNOSIS — I10 Essential (primary) hypertension: Secondary | ICD-10-CM | POA: Insufficient documentation

## 2012-09-02 DIAGNOSIS — Z8619 Personal history of other infectious and parasitic diseases: Secondary | ICD-10-CM | POA: Insufficient documentation

## 2012-09-02 DIAGNOSIS — Z9089 Acquired absence of other organs: Secondary | ICD-10-CM | POA: Insufficient documentation

## 2012-09-02 LAB — URINALYSIS, ROUTINE W REFLEX MICROSCOPIC
Glucose, UA: NEGATIVE mg/dL
Protein, ur: NEGATIVE mg/dL
Specific Gravity, Urine: 1.01 (ref 1.005–1.030)
Urobilinogen, UA: 0.2 mg/dL (ref 0.0–1.0)

## 2012-09-02 LAB — CBC WITH DIFFERENTIAL/PLATELET
Basophils Absolute: 0 10*3/uL (ref 0.0–0.1)
Basophils Relative: 0 % (ref 0–1)
Eosinophils Absolute: 0.1 10*3/uL (ref 0.0–0.7)
Eosinophils Relative: 1 % (ref 0–5)
HCT: 18.3 % — ABNORMAL LOW (ref 36.0–46.0)
Hemoglobin: 6.1 g/dL — CL (ref 12.0–15.0)
MCH: 29.5 pg (ref 26.0–34.0)
MCHC: 33.3 g/dL (ref 30.0–36.0)
MCV: 88.4 fL (ref 78.0–100.0)
Monocytes Absolute: 0.6 10*3/uL (ref 0.1–1.0)
Monocytes Relative: 5 % (ref 3–12)
RDW: 21.6 % — ABNORMAL HIGH (ref 11.5–15.5)

## 2012-09-02 LAB — BASIC METABOLIC PANEL
BUN: 19 mg/dL (ref 6–23)
Calcium: 8.1 mg/dL — ABNORMAL LOW (ref 8.4–10.5)
Chloride: 102 mEq/L (ref 96–112)
Creatinine, Ser: 1.56 mg/dL — ABNORMAL HIGH (ref 0.50–1.10)
GFR calc Af Amer: 47 mL/min — ABNORMAL LOW (ref >90)

## 2012-09-02 LAB — RAPID URINE DRUG SCREEN, HOSP PERFORMED
Amphetamines: NOT DETECTED
Opiates: POSITIVE — AB

## 2012-09-02 LAB — URINE MICROSCOPIC-ADD ON

## 2012-09-02 NOTE — ED Notes (Signed)
Pt seen walking out, advised pt that she needs to stay in room so we can provide care to her. Pt states" well I'm just gonna go to [facility] before the weather hits". Advised pt about her hgb and importance of staying for admission and blood transfusion. Pt stated she will just go to [facility]. EDP aware. Pt did sign elopement

## 2012-09-02 NOTE — ED Notes (Signed)
Pt reports had abscess on left side and had drain removed a little over 1 week ago.  Pt has been having IV antibiotics at home through a PICC line.  Reports fever, chills, and back x 1 week.  Also reports vomiting.

## 2012-09-02 NOTE — ED Provider Notes (Signed)
History    This chart was scribed for Donnetta Hutching, MD by Charolett Bumpers, ED Scribe. The patient was seen in room APA07/APA07. Patient's care was started at 1034.   CSN: [id number]  Arrival date & time 09/02/12  1005   First MD Initiated Contact with Patient 09/02/12 1034      Chief Complaint  Patient presents with  . Back Pain  . Fever    The history is provided by the patient. No language interpreter was used.  [name] is a 42 y.o. female who presents to the Emergency Department complaining of constant, moderate left lower back pain for the past week. She states that she was dx with an abscess to her left flank 4 weeks ago. She was admitted and had a drain placed which was removed about a week ago. She has been receiving IV antibiotics at home through a PICC line, which she last used last night. She states that she is now having fever, chills, vomiting, increased thirst and difficulty urinating.  PCP: [facility]  Past Medical History  Diagnosis Date  . Hypertension   . Depressed   . Drug addiction Hep C  . Hepatitis   . Cancer   . Cervical cancer   . Renal disorder     kidney stones    Past Surgical History  Procedure Laterality Date  . Appendectomy    . Tubal ligation    . Mandible surgery for fracture      No family history on file.  History  Substance Use Topics  . Smoking status: Current Every Day Smoker -- 1.00 packs/day    Types: Cigarettes  . Smokeless tobacco: Not on file  . Alcohol Use: No    OB History   Grav Para Term Preterm Abortions TAB SAB Ect Mult Living                  Review of Systems A complete 10 system review of systems was obtained and all systems are negative except as noted in the HPI and PMH.   Allergies  Ketorolac tromethamine; Tramadol hcl; and Propoxyphene napsylate  Home Medications   Current Outpatient Rx  Name  Route  Sig  Dispense  Refill  . clonazePAM (KLONOPIN) 1 MG tablet    Oral   Take 1 mg by mouth 3 (three) times daily as needed. For anxiety         . cyclobenzaprine (FLEXERIL) 10 MG tablet   Oral   Take 1 tablet (10 mg total) by mouth 3 (three) times daily as needed for muscle spasms.   21 tablet   0   . lisinopril-hydrochlorothiazide (PRINZIDE,ZESTORETIC) 10-12.5 MG per tablet   Oral   Take 1 tablet by mouth daily.         . Multiple Vitamin (MULITIVITAMIN WITH MINERALS) TABS   Oral   Take 1 tablet by mouth daily.         . sennosides-docusate sodium (SENOKOT-S) 8.6-50 MG tablet   Oral   Take 1 tablet by mouth daily.   30 tablet   0   . sodium chloride 0.9 % SOLN 500 mL with vancomycin 10 G SOLR 1,500 mg   Intravenous   Inject 1,500 mg into the vein every 12 (twelve) hours.           BP 121/73  Pulse 98  Temp(Src) 98.3 F (36.8 C) (Oral)  Resp 20  Ht 5\' 2"  (1.575 m)  Wt 165  lb (74.844 kg)  BMI 30.17 kg/m2  SpO2 99%  LMP 08/04/2012  Physical Exam  Nursing note and vitals reviewed. Constitutional: She is oriented to person, place, and time. She appears well-developed and well-nourished.  HENT:  Head: Normocephalic and atraumatic.  Eyes: Conjunctivae and EOM are normal. Pupils are equal, round, and reactive to light.  Neck: Normal range of motion. Neck supple.  Cardiovascular: Normal rate, regular rhythm and normal heart sounds.   Pulmonary/Chest: Effort normal and breath sounds normal.  Abdominal: Soft. Bowel sounds are normal.  Musculoskeletal: Normal range of motion. She exhibits tenderness.  Minimal tenderness to the left flank. Left flank area with no erythema, no palpable masses and no induration. PICC line in right upper arm for antibiotic administration.   Neurological: She is alert and oriented to person, place, and time.  Skin: Skin is warm and dry.  Psychiatric: She has a normal mood and affect.    ED Course  Procedures (including critical care time)  DIAGNOSTIC STUDIES: Oxygen Saturation is 99% on room  air, normal by my interpretation.    COORDINATION OF CARE:  10:50-Discussed planned course of treatment with the patient including UA and basic blood work, who is agreeable at this time.    Labs Reviewed  CBC WITH DIFFERENTIAL  BASIC METABOLIC PANEL  URINE RAPID DRUG SCREEN (HOSP PERFORMED)   No results found.   No diagnosis found.    MDM  Area of abscess on left flank examined.  No erythema, induration, clinical evidence of cellulitis. I suggested we get blood work and urinalysis. Patient requested narcotic pain medicine and this was denied. Left AMA. Registered nurse discussed pros and cons of obtaining proper tests. Patient decided to leave    I personally performed the services described in this documentation, which was scribed in my presence. The recorded information has been reviewed and is accurate.      Donnetta Hutching, MD 09/02/12 1136

## 2012-09-02 NOTE — ED Notes (Signed)
CRITICAL VALUE ALERT  Critical value received:HGB 6.1  Date of notification:  09/02/12  Time of notification:  1109  Critical value read back:yes  Nurse who received alert:  c Estevon Fluke rn  MD notified (1st page):  Dr Adriana Simas  Time of first page:  1109  MD notified (2nd page):  Time of second page:  Responding MD:  Dr Adriana Simas   Time MD responded:  [phone]

## 2012-09-06 ENCOUNTER — Encounter (HOSPITAL_COMMUNITY): Payer: Self-pay | Admitting: *Deleted

## 2012-09-06 ENCOUNTER — Inpatient Hospital Stay (HOSPITAL_COMMUNITY)
Admission: EM | Admit: 2012-09-06 | Discharge: 2012-09-10 | DRG: 808 | Disposition: A | Discharge status: Home / Self Care | Payer: Medicaid Other | Attending: Internal Medicine | Admitting: Internal Medicine

## 2012-09-06 DIAGNOSIS — Z87898 Personal history of other specified conditions: Secondary | ICD-10-CM

## 2012-09-06 DIAGNOSIS — Y849 Medical procedure, unspecified as the cause of abnormal reaction of the patient, or of later complication, without mention of misadventure at the time of the procedure: Secondary | ICD-10-CM | POA: Diagnosis present

## 2012-09-06 DIAGNOSIS — K6812 Psoas muscle abscess: Secondary | ICD-10-CM

## 2012-09-06 DIAGNOSIS — G43909 Migraine, unspecified, not intractable, without status migrainosus: Secondary | ICD-10-CM | POA: Diagnosis present

## 2012-09-06 DIAGNOSIS — N179 Acute kidney failure, unspecified: Secondary | ICD-10-CM

## 2012-09-06 DIAGNOSIS — B961 Klebsiella pneumoniae [K. pneumoniae] as the cause of diseases classified elsewhere: Secondary | ICD-10-CM | POA: Diagnosis present

## 2012-09-06 DIAGNOSIS — N289 Disorder of kidney and ureter, unspecified: Secondary | ICD-10-CM | POA: Diagnosis present

## 2012-09-06 DIAGNOSIS — G8929 Other chronic pain: Secondary | ICD-10-CM | POA: Diagnosis present

## 2012-09-06 DIAGNOSIS — Z9089 Acquired absence of other organs: Secondary | ICD-10-CM

## 2012-09-06 DIAGNOSIS — Z888 Allergy status to other drugs, medicaments and biological substances status: Secondary | ICD-10-CM

## 2012-09-06 DIAGNOSIS — Z87442 Personal history of urinary calculi: Secondary | ICD-10-CM

## 2012-09-06 DIAGNOSIS — D696 Thrombocytopenia, unspecified: Secondary | ICD-10-CM | POA: Diagnosis present

## 2012-09-06 DIAGNOSIS — N170 Acute kidney failure with tubular necrosis: Secondary | ICD-10-CM | POA: Diagnosis not present

## 2012-09-06 DIAGNOSIS — K921 Melena: Secondary | ICD-10-CM

## 2012-09-06 DIAGNOSIS — D594 Other nonautoimmune hemolytic anemias: Principal | ICD-10-CM | POA: Diagnosis present

## 2012-09-06 DIAGNOSIS — R059 Cough, unspecified: Secondary | ICD-10-CM

## 2012-09-06 DIAGNOSIS — K279 Peptic ulcer, site unspecified, unspecified as acute or chronic, without hemorrhage or perforation: Secondary | ICD-10-CM

## 2012-09-06 DIAGNOSIS — M545 Low back pain, unspecified: Secondary | ICD-10-CM

## 2012-09-06 DIAGNOSIS — Z791 Long term (current) use of non-steroidal anti-inflammatories (NSAID): Secondary | ICD-10-CM

## 2012-09-06 DIAGNOSIS — B1920 Unspecified viral hepatitis C without hepatic coma: Secondary | ICD-10-CM | POA: Diagnosis present

## 2012-09-06 DIAGNOSIS — D649 Anemia, unspecified: Secondary | ICD-10-CM

## 2012-09-06 DIAGNOSIS — T80211A Bloodstream infection due to central venous catheter, initial encounter: Secondary | ICD-10-CM | POA: Diagnosis present

## 2012-09-06 DIAGNOSIS — F329 Major depressive disorder, single episode, unspecified: Secondary | ICD-10-CM | POA: Diagnosis present

## 2012-09-06 DIAGNOSIS — E785 Hyperlipidemia, unspecified: Secondary | ICD-10-CM

## 2012-09-06 DIAGNOSIS — N390 Urinary tract infection, site not specified: Secondary | ICD-10-CM

## 2012-09-06 DIAGNOSIS — F1410 Cocaine abuse, uncomplicated: Secondary | ICD-10-CM | POA: Diagnosis present

## 2012-09-06 DIAGNOSIS — F3289 Other specified depressive episodes: Secondary | ICD-10-CM

## 2012-09-06 DIAGNOSIS — I10 Essential (primary) hypertension: Secondary | ICD-10-CM

## 2012-09-06 DIAGNOSIS — T8089XA Other complications following infusion, transfusion and therapeutic injection, initial encounter: Secondary | ICD-10-CM | POA: Diagnosis not present

## 2012-09-06 DIAGNOSIS — F17200 Nicotine dependence, unspecified, uncomplicated: Secondary | ICD-10-CM | POA: Diagnosis present

## 2012-09-06 DIAGNOSIS — Y92009 Unspecified place in unspecified non-institutional (private) residence as the place of occurrence of the external cause: Secondary | ICD-10-CM

## 2012-09-06 DIAGNOSIS — E871 Hypo-osmolality and hyponatremia: Secondary | ICD-10-CM

## 2012-09-06 DIAGNOSIS — N859 Noninflammatory disorder of uterus, unspecified: Secondary | ICD-10-CM

## 2012-09-06 DIAGNOSIS — M129 Arthropathy, unspecified: Secondary | ICD-10-CM

## 2012-09-06 DIAGNOSIS — Z8711 Personal history of peptic ulcer disease: Secondary | ICD-10-CM

## 2012-09-06 DIAGNOSIS — Z8719 Personal history of other diseases of the digestive system: Secondary | ICD-10-CM

## 2012-09-06 DIAGNOSIS — R7881 Bacteremia: Secondary | ICD-10-CM

## 2012-09-06 DIAGNOSIS — F1920 Other psychoactive substance dependence, uncomplicated: Secondary | ICD-10-CM | POA: Diagnosis present

## 2012-09-06 DIAGNOSIS — L408 Other psoriasis: Secondary | ICD-10-CM

## 2012-09-06 DIAGNOSIS — Z79899 Other long term (current) drug therapy: Secondary | ICD-10-CM

## 2012-09-06 DIAGNOSIS — M542 Cervicalgia: Secondary | ICD-10-CM

## 2012-09-06 DIAGNOSIS — Z8541 Personal history of malignant neoplasm of cervix uteri: Secondary | ICD-10-CM

## 2012-09-06 DIAGNOSIS — D638 Anemia in other chronic diseases classified elsewhere: Secondary | ICD-10-CM | POA: Diagnosis present

## 2012-09-06 DIAGNOSIS — D509 Iron deficiency anemia, unspecified: Secondary | ICD-10-CM | POA: Diagnosis present

## 2012-09-06 DIAGNOSIS — Z8614 Personal history of Methicillin resistant Staphylococcus aureus infection: Secondary | ICD-10-CM

## 2012-09-06 DIAGNOSIS — R635 Abnormal weight gain: Secondary | ICD-10-CM

## 2012-09-06 DIAGNOSIS — K3184 Gastroparesis: Secondary | ICD-10-CM

## 2012-09-06 DIAGNOSIS — K219 Gastro-esophageal reflux disease without esophagitis: Secondary | ICD-10-CM

## 2012-09-06 DIAGNOSIS — E876 Hypokalemia: Secondary | ICD-10-CM

## 2012-09-06 DIAGNOSIS — Z720 Tobacco use: Secondary | ICD-10-CM

## 2012-09-06 DIAGNOSIS — R05 Cough: Secondary | ICD-10-CM

## 2012-09-06 DIAGNOSIS — F411 Generalized anxiety disorder: Secondary | ICD-10-CM

## 2012-09-06 DIAGNOSIS — K625 Hemorrhage of anus and rectum: Secondary | ICD-10-CM | POA: Diagnosis present

## 2012-09-06 DIAGNOSIS — A498 Other bacterial infections of unspecified site: Secondary | ICD-10-CM | POA: Diagnosis present

## 2012-09-06 DIAGNOSIS — M549 Dorsalgia, unspecified: Secondary | ICD-10-CM | POA: Diagnosis present

## 2012-09-06 DIAGNOSIS — K644 Residual hemorrhoidal skin tags: Secondary | ICD-10-CM | POA: Diagnosis present

## 2012-09-06 DIAGNOSIS — F1120 Opioid dependence, uncomplicated: Secondary | ICD-10-CM | POA: Diagnosis present

## 2012-09-06 HISTORY — DX: Gastroparesis: K31.84

## 2012-09-06 HISTORY — DX: Gastro-esophageal reflux disease without esophagitis: K21.9

## 2012-09-06 HISTORY — DX: Unspecified viral hepatitis C without hepatic coma: B19.20

## 2012-09-06 HISTORY — DX: Other psychoactive substance dependence, uncomplicated: F19.20

## 2012-09-06 HISTORY — DX: Opioid dependence, uncomplicated: F11.20

## 2012-09-06 LAB — CBC WITH DIFFERENTIAL/PLATELET
Basophils Absolute: 0.1 10*3/uL (ref 0.0–0.1)
Lymphs Abs: 3.3 10*3/uL (ref 0.7–4.0)
MCH: 29.3 pg (ref 26.0–34.0)
MCV: 91.2 fL (ref 78.0–100.0)
Monocytes Absolute: 0.7 10*3/uL (ref 0.1–1.0)
Neutrophils Relative %: 50 % (ref 43–77)
Platelets: 149 10*3/uL — ABNORMAL LOW (ref 150–400)
RDW: 23 % — ABNORMAL HIGH (ref 11.5–15.5)
WBC: 8.6 10*3/uL (ref 4.0–10.5)

## 2012-09-06 LAB — URINALYSIS, ROUTINE W REFLEX MICROSCOPIC
Ketones, ur: NEGATIVE mg/dL
Leukocytes, UA: NEGATIVE
Nitrite: NEGATIVE
pH: 6 (ref 5.0–8.0)

## 2012-09-06 LAB — URINE MICROSCOPIC-ADD ON

## 2012-09-06 LAB — BASIC METABOLIC PANEL
Calcium: 9.1 mg/dL (ref 8.4–10.5)
Creatinine, Ser: 1.36 mg/dL — ABNORMAL HIGH (ref 0.50–1.10)
GFR calc Af Amer: 55 mL/min — ABNORMAL LOW (ref >90)
GFR calc non Af Amer: 48 mL/min — ABNORMAL LOW (ref >90)
Sodium: 137 mEq/L (ref 135–145)

## 2012-09-06 LAB — LACTATE DEHYDROGENASE: LDH: 427 U/L — ABNORMAL HIGH (ref 94–250)

## 2012-09-06 LAB — PREPARE RBC (CROSSMATCH)

## 2012-09-06 LAB — RETICULOCYTES
Retic Count, Absolute: 326.4 10*3/uL — ABNORMAL HIGH (ref 19.0–186.0)
Retic Ct Pct: 16 % — ABNORMAL HIGH (ref 0.4–3.1)

## 2012-09-06 MED ORDER — PROMETHAZINE HCL 25 MG/ML IJ SOLN
25.0000 mg | Freq: Four times a day (QID) | INTRAMUSCULAR | Status: AC | PRN
  Administered 2012-09-07 (×2): 25 mg via INTRAVENOUS
  Filled 2012-09-06 (×2): qty 1

## 2012-09-06 MED ORDER — LISINOPRIL 10 MG PO TABS: 10.0000 mg | ORAL_TABLET | Freq: Every day | ORAL | Status: DC

## 2012-09-06 MED ORDER — ONDANSETRON HCL 4 MG/2ML IJ SOLN
4.0000 mg | Freq: Three times a day (TID) | INTRAMUSCULAR | Status: AC | PRN
  Filled 2012-09-06: qty 2

## 2012-09-06 MED ORDER — CLONIDINE HCL 0.1 MG PO TABS
0.1000 mg | ORAL_TABLET | Freq: Three times a day (TID) | ORAL | Status: DC
  Administered 2012-09-06 – 2012-09-10 (×8): 0.1 mg via ORAL
  Filled 2012-09-06 (×10): qty 1

## 2012-09-06 MED ORDER — SENNOSIDES-DOCUSATE SODIUM 8.6-50 MG PO TABS: 1.0000 | ORAL_TABLET | Freq: Every day | ORAL | Status: DC | PRN

## 2012-09-06 MED ORDER — IBUPROFEN 600 MG PO TABS: 600.0000 mg | ORAL_TABLET | Freq: Four times a day (QID) | ORAL | Status: DC | PRN

## 2012-09-06 MED ORDER — SODIUM CHLORIDE 0.9 % IV SOLN: 250.0000 mL | INTRAVENOUS | Status: DC | PRN

## 2012-09-06 MED ORDER — CLONAZEPAM 0.5 MG PO TABS
1.0000 mg | ORAL_TABLET | Freq: Three times a day (TID) | ORAL | Status: DC | PRN
  Administered 2012-09-06: 1 mg via ORAL
  Filled 2012-09-06: qty 2

## 2012-09-06 MED ORDER — ACETAMINOPHEN 325 MG PO TABS: 650.0000 mg | ORAL_TABLET | Freq: Four times a day (QID) | ORAL | Status: DC | PRN

## 2012-09-06 MED ORDER — PANTOPRAZOLE SODIUM 40 MG IV SOLR
40.0000 mg | Freq: Two times a day (BID) | INTRAVENOUS | Status: DC
  Administered 2012-09-06 – 2012-09-07 (×2): 40 mg via INTRAVENOUS
  Filled 2012-09-06 (×2): qty 40

## 2012-09-06 MED ORDER — IBUPROFEN 800 MG PO TABS: 800.0000 mg | ORAL_TABLET | Freq: Once | ORAL | Status: DC

## 2012-09-06 MED ORDER — ONDANSETRON HCL 4 MG/2ML IJ SOLN
4.0000 mg | Freq: Four times a day (QID) | INTRAMUSCULAR | Status: DC | PRN
  Administered 2012-09-06 – 2012-09-08 (×2): 4 mg via INTRAVENOUS
  Filled 2012-09-06: qty 2

## 2012-09-06 MED ORDER — KETOROLAC TROMETHAMINE 30 MG/ML IJ SOLN
30.0000 mg | Freq: Four times a day (QID) | INTRAMUSCULAR | Status: DC | PRN
  Administered 2012-09-06 – 2012-09-07 (×2): 30 mg via INTRAVENOUS
  Filled 2012-09-06 (×2): qty 1

## 2012-09-06 MED ORDER — ONDANSETRON HCL 4 MG PO TABS
4.0000 mg | ORAL_TABLET | Freq: Four times a day (QID) | ORAL | Status: DC | PRN
  Administered 2012-09-07 – 2012-09-10 (×3): 4 mg via ORAL
  Filled 2012-09-06 (×3): qty 1

## 2012-09-06 MED ORDER — POTASSIUM CHLORIDE CRYS ER 20 MEQ PO TBCR
40.0000 meq | EXTENDED_RELEASE_TABLET | Freq: Once | ORAL | Status: AC
  Administered 2012-09-06: 40 meq via ORAL
  Filled 2012-09-06: qty 2

## 2012-09-06 MED ORDER — LISINOPRIL-HYDROCHLOROTHIAZIDE 10-12.5 MG PO TABS: 1.0000 | ORAL_TABLET | Freq: Every day | ORAL | Status: DC

## 2012-09-06 MED ORDER — SODIUM CHLORIDE 0.9 % IJ SOLN
3.0000 mL | Freq: Two times a day (BID) | INTRAMUSCULAR | Status: DC
  Administered 2012-09-07 – 2012-09-10 (×5): 3 mL via INTRAVENOUS
  Filled 2012-09-06 (×2): qty 3

## 2012-09-06 MED ORDER — ACETAMINOPHEN 650 MG RE SUPP: 650.0000 mg | Freq: Four times a day (QID) | RECTAL | Status: DC | PRN

## 2012-09-06 MED ORDER — SODIUM CHLORIDE 0.9 % IJ SOLN
3.0000 mL | INTRAMUSCULAR | Status: DC | PRN
  Administered 2012-09-07: 3 mL via INTRAVENOUS
  Administered 2012-09-07: 10 mL via INTRAVENOUS
  Administered 2012-09-09: 3 mL via INTRAVENOUS

## 2012-09-06 MED ORDER — SENNA-DOCUSATE SODIUM 8.6-50 MG PO TABS: 1.0000 | ORAL_TABLET | Freq: Every day | ORAL | Status: DC | PRN

## 2012-09-06 MED ORDER — HYDROCHLOROTHIAZIDE 25 MG PO TABS: 12.5000 mg | ORAL_TABLET | Freq: Every day | ORAL | Status: DC

## 2012-09-06 MED ORDER — LORAZEPAM 2 MG/ML IJ SOLN
1.0000 mg | INTRAMUSCULAR | Status: AC | PRN
  Administered 2012-09-06 – 2012-09-07 (×2): 1 mg via INTRAVENOUS
  Filled 2012-09-06: qty 1

## 2012-09-06 MED ORDER — LORAZEPAM 2 MG/ML IJ SOLN
1.0000 mg | Freq: Once | INTRAMUSCULAR | Status: AC
  Administered 2012-09-06: 1 mg via INTRAVENOUS
  Filled 2012-09-06 (×2): qty 1

## 2012-09-06 MED ORDER — KETOROLAC TROMETHAMINE 30 MG/ML IJ SOLN: 30.0000 mg | Freq: Four times a day (QID) | INTRAMUSCULAR | Status: DC | PRN

## 2012-09-06 MED ORDER — SODIUM CHLORIDE 0.9 % IV BOLUS (SEPSIS)
1000.0000 mL | Freq: Once | INTRAVENOUS | Status: AC
  Administered 2012-09-06: 1000 mL via INTRAVENOUS

## 2012-09-06 NOTE — ED Notes (Signed)
Currently being treated for infection (abdominal abscess). PICC line in place. Scheduled to come back tomorrow for blood transfusion. Pt was to be admitted last ED visit but chose to go home. States she thought she was going to have a BM this morning on only noticed dark red blood in the toilet.

## 2012-09-06 NOTE — ED Notes (Signed)
PER EDP transfusion can take place on floor due to stability of pt and staff availability.

## 2012-09-06 NOTE — H&P (Signed)
[facility] History and Physical  [name]:[id number] DOB: 06-11-1971 DOA: 09/06/2012  Referring physician: Eber Hong, EDP PCP: No primary provider on file.  Specialists:   Chief Complaint: Blood in stool  HPI: [name] is a 42 y.o. female with history of recent psoas abscess status post percutaneous drain, was completed a four-week course of IV vancomycin. On her previous admission approximately one month ago, she was noted to be somewhat anemic. She did not have any frank bleeding at that time. Her hemoglobin was in the 8 range. She initially presented to the emergency room approximately 4 days ago. She left the hospital AGAINST MEDICAL ADVICE when she was told she would not receive any opiate medications. She has come back to the emergency room today with complaints of blood in her stool. She reported dark blood in the toilet today. She denies any hematemesis. She's not had any previous episodes in the past. She does feel generally weak but denies any chest pain or shortness of breath or dizziness. She does report taking Aleve approximately twice a week for back pain. She does also report having acid reflux and takes TUMS frequently. She reports that she is finishing up her menstrual cycle and her menstrual flow has not been any heavier than it has normally been. She also has chronic back pain. She was evaluated in the emergency room and found to have a low hemoglobin of 6. Per ER staff, rectal exam did not show any gross blood and was Hemoccult negative. She was noted to have a single external hemorrhoid which was nontender and not bleeding. She's been referred for admission.  Review of Systems: Pertinent positives as per history of present illness, otherwise negative  Past Medical History  Diagnosis Date  . Hypertension   . Depressed   . Drug addiction Hep C  . Hepatitis   . Cancer   . Cervical cancer   . Renal disorder     kidney stones   Past Surgical  History  Procedure Laterality Date  . Appendectomy    . Tubal ligation    . Mandible surgery for fracture     Social History:  reports that she has been smoking Cigarettes.  She has been smoking about 1.00 pack per day. She does not have any smokeless tobacco history on file. She reports that she uses illicit drugs (Cocaine, IV, and "Crack" cocaine). She reports that she does not drink alcohol. she does have a history of prior opiate abuse. She would crush up Opana and inject herself   Allergies  Allergen Reactions  . Tramadol Hcl Anaphylaxis and Swelling    Of tongue  . Ketorolac Tromethamine Nausea And Vomiting  . Propoxyphene Napsylate Rash    Family history: Mother is alive and well and is not having chronic medical problems.  Prior to Admission medications   Medication Sig Start Date End Date Taking? Authorizing Provider  clonazePAM (KLONOPIN) 1 MG tablet Take 1 mg by mouth 3 (three) times daily as needed for anxiety. For anxiety   Yes Historical Provider, MD  lisinopril-hydrochlorothiazide (PRINZIDE,ZESTORETIC) 10-12.5 MG per tablet Take 1 tablet by mouth daily.   Yes Historical Provider, MD  sennosides-docusate sodium (SENOKOT-S) 8.6-50 MG tablet Take 1 tablet by mouth daily as needed for constipation. 08/08/12  Yes Nimish Normajean Glasgow, MD  sodium chloride 0.9 % SOLN 500 mL with vancomycin 10 G SOLR Inject 1,500 mg into the vein daily. 08/08/12  Yes Wilson Singer, MD   Physical  Exam: Filed Vitals:   09/06/12 1535 09/06/12 1732 09/06/12 1826 09/06/12 1844  BP: 122/78 118/79 135/84 144/85  Pulse: 91 107 114 110  Temp:  97.4 F (36.3 C) 97.8 F (36.6 C) 97.5 F (36.4 C)  TempSrc:  Oral Oral Oral  Resp: 20  20 20   Height:  5\' 2"  (1.575 m)    Weight:  72.576 kg (160 lb)    SpO2: 100% 9%       General:  No acute distress, does not appear toxic, sitting up in bed eating lunch  Eyes: Pupils are equal round reactive to light  ENT: Mucous membranes are moist  Neck:  Supple  Cardiovascular: S1, S2, regular rate and rhythm  Respiratory: Clear to auscultation bilaterally  Abdomen: Soft, nontender, nondistended, bowel sounds are active  Skin: Deferred  Musculoskeletal: Deferred  Psychiatric: Normal affect, cooperative exam  Neurologic: Grossly intact, nonfocal  Labs on Admission:  Basic Metabolic Panel:  Recent Labs Lab 09/02/12 1037 09/06/12 1345  NA 135 137  K 3.4* 3.4*  CL 102 101  CO2 23 26  GLUCOSE 107* 95  BUN 19 18  CREATININE 1.56* 1.36*  CALCIUM 8.1* 9.1   Liver Function Tests: No results found for this basename: AST, ALT, ALKPHOS, BILITOT, PROT, ALBUMIN,  in the last 168 hours No results found for this basename: LIPASE, AMYLASE,  in the last 168 hours No results found for this basename: AMMONIA,  in the last 168 hours CBC:  Recent Labs Lab 09/02/12 1037 09/06/12 1314  WBC 12.4* 8.6  NEUTROABS 9.3* 4.2  HGB 6.1* 6.0*  HCT 18.3* 18.7*  MCV 88.4 91.2  PLT 114* 149*   Cardiac Enzymes: No results found for this basename: CKTOTAL, CKMB, CKMBINDEX, TROPONINI,  in the last 168 hours  BNP (last 3 results) No results found for this basename: PROBNP,  in the last 8760 hours CBG: No results found for this basename: GLUCAP,  in the last 168 hours  Radiological Exams on Admission: No results found.   Assessment/Plan Principal Problem:   Anemia Active Problems:   HYPERTENSION   GERD   LOW BACK PAIN   Tobacco abuse   Hypokalemia   Blood in stool   1. Anemia with concerns of GI bleeding. Patient did have some blood in her stools. She'll be admitted to the hospital for further evaluation and observation. We will transfuse her 2 units PRBCs and requested a GI consultation. Patient will receive proton pump inhibitors. We will keep her n.p.o. after midnight for possible endoscopy in the morning. I suspect that a certain degree her anemia may be from chronic disease. We'll check anemia panel as well as LDH and  haptoglobin. Check TSH 2. Hypokalemia. Replace 3. Hypertension. Stable 4. GERD. Continue proton pump inhibitors 5. Low back pain. Chronic issue. Patient was informed that she'll not receive any opiates. 6. Recent psoas abscess. Patient underwent percutaneous trach placement. Repeat CT scan indicated resolution of abscess and her drain was removed. She still has PICC line in place. She has a normal WBC count with resolution of abscess. She's completed more than 4 weeks of IV vancomycin. We will discontinue vancomycin. Her PICC line may be discontinued prior to discharge.  Code Status: Full code Family Communication: Discussed with patient the bedside Disposition Plan: Possible discharge tomorrow  Time spent: 60 minutes  MEMON,JEHANZEB [facility] Pager [phone]  If 7PM-7AM, please contact night-coverage [email or web address] Password [facility] 09/06/2012, 6:49 PM

## 2012-09-06 NOTE — ED Notes (Signed)
Pt wants to wait before taking her motrin. States she will wait to talk with the hospitalist about her pain meds

## 2012-09-06 NOTE — ED Notes (Signed)
CRITICAL VALUE ALERT  Critical value received:  HGB 6.0  Date of notification:  09/06/12  Time of notification:  1408  Critical value read back:yes  Nurse who received alert:  Lawernce Ion RN  MD notified (1st page):  1408  Time of first page:  1408  MD notified (2nd page):  Time of second page:  Responding MD:  Hyacinth Meeker  Time MD responded:  [phone]

## 2012-09-06 NOTE — ED Notes (Signed)
Pt states she was seen here last week and was told she needed 2 pints of blood, pt left with the intention of going to [facility], pt did not go to [facility] and feels weak today. Pt states she had one bloody stool today.

## 2012-09-06 NOTE — ED Provider Notes (Signed)
History     CSN: [id number]  Arrival date & time 09/06/12  1224   First MD Initiated Contact with Patient 09/06/12 1259      Chief Complaint  Patient presents with  . Rectal Bleeding    (Consider location/radiation/quality/duration/timing/severity/associated sxs/prior treatment) HPI Comments: Pt presents with c/o rectal bleeding, according to the MR, the pt was recently seen multiple times in the past month for c/o pain.  She was recently treated for a psoas abscess that required IR drain placement (was placed, abscess resolved and then removed) as well as PICC placement for which she currently is getting Vancomycin.  She states that she is not taking the medicine regularly, last using it 36 hours ago.  She c/o having a bloody BM this AM - describes that this was a single episode of watery stool with dark red blood.  She has had generalized weakness but denies f/c/n/v/cough/sob and has had an appetite eating meals without difficulty.  Nothing makes this better or worse, she is on no anticoagulants and she does have a pain medicines addiction and drug seeking behaviour - she left AMA 4 days ago when she presented with back pain and requested narcotics (denied Opiate meds).  She was found to have a low Hgb at that time around 6.  Patient is a 42 y.o. female presenting with hematochezia. The history is provided by the patient.  Rectal Bleeding     Past Medical History  Diagnosis Date  . Hypertension   . Depressed   . Drug addiction Hep C  . Hepatitis   . Cancer   . Cervical cancer   . Renal disorder     kidney stones    Past Surgical History  Procedure Laterality Date  . Appendectomy    . Tubal ligation    . Mandible surgery for fracture      No family history on file.  History  Substance Use Topics  . Smoking status: Current Every Day Smoker -- 1.00 packs/day    Types: Cigarettes  . Smokeless tobacco: Not on file  . Alcohol Use: No    OB History   Grav Para Term  Preterm Abortions TAB SAB Ect Mult Living                  Review of Systems  Gastrointestinal: Positive for hematochezia.  All other systems reviewed and are negative.    Allergies  Tramadol hcl; Ketorolac tromethamine; and Propoxyphene napsylate  Home Medications   Current Outpatient Rx  Name  Route  Sig  Dispense  Refill  . clonazePAM (KLONOPIN) 1 MG tablet   Oral   Take 1 mg by mouth 3 (three) times daily as needed for anxiety. For anxiety         . lisinopril-hydrochlorothiazide (PRINZIDE,ZESTORETIC) 10-12.5 MG per tablet   Oral   Take 1 tablet by mouth daily.         . sennosides-docusate sodium (SENOKOT-S) 8.6-50 MG tablet   Oral   Take 1 tablet by mouth daily as needed for constipation.         . sodium chloride 0.9 % SOLN 500 mL with vancomycin 10 G SOLR   Intravenous   Inject 1,500 mg into the vein daily.           BP 125/87  Pulse 98  Temp(Src) 97.7 F (36.5 C) (Oral)  Resp 18  Ht 5\' 2"  (1.575 m)  Wt 160 lb (72.576 kg)  BMI 29.26 kg/m2  SpO2  100%  LMP 09/06/2012  Physical Exam  Nursing note and vitals reviewed. Constitutional: She appears well-developed and well-nourished. No distress.  HENT:  Head: Normocephalic and atraumatic.  Mouth/Throat: Oropharynx is clear and moist. No oropharyngeal exudate.  Eyes: Conjunctivae and EOM are normal. Pupils are equal, round, and reactive to light. Right eye exhibits no discharge. Left eye exhibits no discharge. No scleral icterus.  Neck: Normal range of motion. Neck supple. No JVD present. No thyromegaly present.  Cardiovascular: Normal rate, regular rhythm, normal heart sounds and intact distal pulses.  Exam reveals no gallop and no friction rub.   No murmur heard. Pulmonary/Chest: Effort normal and breath sounds normal. No respiratory distress. She has no wheezes. She has no rales.  Abdominal: Soft. Bowel sounds are normal. She exhibits no distension and no mass. There is no tenderness.  No abd ttp   Genitourinary:  Chaperone present for rectal exam, no bleeding, formed stool in the rectal vault - hemoccult negative on my exam and testing.  She has a single external hemorrhoid which is non tender and not bleeding  Musculoskeletal: Normal range of motion. She exhibits no edema and no tenderness.  Lymphadenopathy:    She has no cervical adenopathy.  Neurological: She is alert. Coordination normal.  Skin: Skin is warm and dry. No rash noted. No erythema.  Psychiatric: She has a normal mood and affect. Her behavior is normal.    ED Course  Procedures (including critical care time)  Labs Reviewed  CBC WITH DIFFERENTIAL - Abnormal; Notable for the following:    RBC 2.05 (*)    Hemoglobin 6.0 (*)    HCT 18.7 (*)    RDW 23.0 (*)    Platelets 149 (*)    All other components within normal limits  BASIC METABOLIC PANEL - Abnormal; Notable for the following:    Potassium 3.4 (*)    Creatinine, Ser 1.36 (*)    GFR calc non Af Amer 48 (*)    GFR calc Af Amer 55 (*)    All other components within normal limits  PREPARE RBC (CROSSMATCH)   No results found.   1. Anemia       MDM  Has normal VS, no tachycardia, no blood in stool - recheck Hgb.  No abd ttp.  Vitals unremarkable.   Hgb low - has 6.0 and needs transfusion - orders for same, admit to Dr. Kerry Hough.       Vida Roller, MD 09/06/12 [phone]

## 2012-09-07 ENCOUNTER — Observation Stay (HOSPITAL_COMMUNITY): Payer: Medicaid Other

## 2012-09-07 ENCOUNTER — Encounter (HOSPITAL_COMMUNITY): Payer: Self-pay | Admitting: Urgent Care

## 2012-09-07 DIAGNOSIS — F1911 Other psychoactive substance abuse, in remission: Secondary | ICD-10-CM

## 2012-09-07 DIAGNOSIS — K3184 Gastroparesis: Secondary | ICD-10-CM

## 2012-09-07 DIAGNOSIS — R7881 Bacteremia: Secondary | ICD-10-CM | POA: Diagnosis present

## 2012-09-07 DIAGNOSIS — D72829 Elevated white blood cell count, unspecified: Secondary | ICD-10-CM

## 2012-09-07 DIAGNOSIS — K219 Gastro-esophageal reflux disease without esophagitis: Secondary | ICD-10-CM

## 2012-09-07 DIAGNOSIS — N179 Acute kidney failure, unspecified: Secondary | ICD-10-CM

## 2012-09-07 DIAGNOSIS — D599 Acquired hemolytic anemia, unspecified: Secondary | ICD-10-CM

## 2012-09-07 DIAGNOSIS — B9689 Other specified bacterial agents as the cause of diseases classified elsewhere: Secondary | ICD-10-CM

## 2012-09-07 LAB — APTT: aPTT: 35 seconds (ref 24–37)

## 2012-09-07 LAB — BASIC METABOLIC PANEL
BUN: 20 mg/dL (ref 6–23)
Chloride: 104 mEq/L (ref 96–112)
GFR calc Af Amer: 42 mL/min — ABNORMAL LOW (ref >90)
GFR calc non Af Amer: 36 mL/min — ABNORMAL LOW (ref >90)
Potassium: 3.9 mEq/L (ref 3.5–5.1)
Sodium: 135 mEq/L (ref 135–145)

## 2012-09-07 LAB — CBC
HCT: 21.7 % — ABNORMAL LOW (ref 36.0–46.0)
Hemoglobin: 7.1 g/dL — ABNORMAL LOW (ref 12.0–15.0)
MCHC: 32.7 g/dL (ref 30.0–36.0)
RBC: 2.38 MIL/uL — ABNORMAL LOW (ref 3.87–5.11)
WBC: 21 10*3/uL — ABNORMAL HIGH (ref 4.0–10.5)

## 2012-09-07 LAB — TSH: TSH: 7.17 u[IU]/mL — ABNORMAL HIGH (ref 0.350–4.500)

## 2012-09-07 LAB — COMPREHENSIVE METABOLIC PANEL
Alkaline Phosphatase: 53 U/L (ref 39–117)
BUN: 20 mg/dL (ref 6–23)
GFR calc Af Amer: 42 mL/min — ABNORMAL LOW (ref >90)
Glucose, Bld: 122 mg/dL — ABNORMAL HIGH (ref 70–99)
Potassium: 3 mEq/L — ABNORMAL LOW (ref 3.5–5.1)
Total Protein: 6.9 g/dL (ref 6.0–8.3)

## 2012-09-07 LAB — HEPATIC FUNCTION PANEL
AST: 20 U/L (ref 0–37)
Bilirubin, Direct: 0.1 mg/dL (ref 0.0–0.3)
Indirect Bilirubin: 0.3 mg/dL (ref 0.3–0.9)
Total Bilirubin: 0.4 mg/dL (ref 0.3–1.2)

## 2012-09-07 LAB — LACTATE DEHYDROGENASE: LDH: 334 U/L — ABNORMAL HIGH (ref 94–250)

## 2012-09-07 LAB — FERRITIN: Ferritin: 315 ng/mL — ABNORMAL HIGH (ref 10–291)

## 2012-09-07 LAB — PREGNANCY, URINE: Preg Test, Ur: NEGATIVE

## 2012-09-07 LAB — VITAMIN B12: Vitamin B-12: 511 pg/mL (ref 211–911)

## 2012-09-07 LAB — HAPTOGLOBIN: Haptoglobin: <25 mg/dL — ABNORMAL LOW (ref 45–215)

## 2012-09-07 LAB — IRON AND TIBC: Iron: 99 ug/dL (ref 42–135)

## 2012-09-07 MED ORDER — IOHEXOL 300 MG/ML  SOLN
50.0000 mL | INTRAMUSCULAR | Status: AC
  Administered 2012-09-07 (×2): 50 mL via ORAL

## 2012-09-07 MED ORDER — PANTOPRAZOLE SODIUM 40 MG PO TBEC
40.0000 mg | DELAYED_RELEASE_TABLET | Freq: Two times a day (BID) | ORAL | Status: DC
  Administered 2012-09-07 – 2012-09-10 (×6): 40 mg via ORAL
  Filled 2012-09-07 (×6): qty 1

## 2012-09-07 MED ORDER — PIPERACILLIN-TAZOBACTAM 3.375 G IVPB
3.3750 g | Freq: Three times a day (TID) | INTRAVENOUS | Status: DC
  Administered 2012-09-07 – 2012-09-09 (×6): 3.375 g via INTRAVENOUS
  Filled 2012-09-07 (×9): qty 50

## 2012-09-07 MED ORDER — TRAMADOL HCL 50 MG PO TABS
50.0000 mg | ORAL_TABLET | Freq: Four times a day (QID) | ORAL | Status: DC | PRN
  Administered 2012-09-07 – 2012-09-09 (×7): 50 mg via ORAL
  Filled 2012-09-07 (×9): qty 1

## 2012-09-07 MED ORDER — SODIUM CHLORIDE 0.9 % IV SOLN: 250.0000 mL | INTRAVENOUS | Status: DC | PRN

## 2012-09-07 MED ORDER — PROMETHAZINE HCL 25 MG/ML IJ SOLN
12.5000 mg | Freq: Four times a day (QID) | INTRAMUSCULAR | Status: DC | PRN
  Administered 2012-09-07 – 2012-09-09 (×6): 12.5 mg via INTRAVENOUS
  Filled 2012-09-07 (×6): qty 1

## 2012-09-07 MED ORDER — IOHEXOL 300 MG/ML  SOLN
100.0000 mL | Freq: Once | INTRAMUSCULAR | Status: AC | PRN
  Administered 2012-09-07: 80 mL via INTRAVENOUS

## 2012-09-07 MED ORDER — LORAZEPAM 2 MG/ML IJ SOLN
1.0000 mg | Freq: Four times a day (QID) | INTRAMUSCULAR | Status: DC | PRN
  Administered 2012-09-07 – 2012-09-09 (×6): 1 mg via INTRAVENOUS
  Filled 2012-09-07 (×6): qty 1

## 2012-09-07 MED ORDER — IOHEXOL 300 MG/ML  SOLN: 50.0000 mL | Freq: Once | INTRAMUSCULAR | Status: AC | PRN

## 2012-09-07 MED ORDER — LORAZEPAM 2 MG/ML IJ SOLN
1.0000 mg | INTRAMUSCULAR | Status: DC | PRN
  Administered 2012-09-07: 1 mg via INTRAVENOUS
  Filled 2012-09-07: qty 1

## 2012-09-07 NOTE — Care Management Note (Addendum)
    Page 1 of 1   09/08/2012     3:18:15 PM   CARE MANAGEMENT NOTE 09/08/2012  Patient:  FANTA, WIMBERLEY   Account Number:  [id number]  Date Initiated:  09/07/2012  Documentation initiated by:  Rosemary Holms  Subjective/Objective Assessment:   Pt admitted with anemia, fevers. Has PICC line. [facility] notified CM today that pt has been DC'd from their services from the Pharmacy Dept. and reported abuse with PICC.     Action/Plan:   Anticipated DC Date:     Anticipated DC Plan:  HOME/SELF CARE      DC Planning Services  CM consult      Choice offered to / List presented to:             Status of service:  In process, will continue to follow Medicare Important Message given?   (If response is "NO", the following Medicare IM given date fields will be blank) Date Medicare IM given:   Date Additional Medicare IM given:    Discharge Disposition:    Per UR Regulation:    If discussed at Long Length of Stay Meetings, dates discussed:    Comments:  09/08/12 Rosemary Holms RN BSN CM Per pt and PCP verified, [facility] is still pt's PCP. Pt does not feel she will need HH.  09/07/12 Adrik Khim Leanord Hawking RN BSN CM

## 2012-09-07 NOTE — Progress Notes (Signed)
Patient exhibiting withdrawal symptoms including headache, tremors, extreme anxiety, fever. Patient stated she was having withdrawal from opiates. Temperature elevated to 101.5 after administration of 1st unit of blood. Dr. Phillips Odor notified and advised this nurse not to  give the 2nd unit of blood. Orders were given and followed.

## 2012-09-07 NOTE — Consult Note (Signed)
[facility] Consultation Oncology  Name: [name]      MRN: [id number]    Location: A310/A310-01  Date: 09/07/2012 Time:5:47 PM   REFERRING PHYSICIAN:  Erick Blinks, MD  REASON FOR CONSULT:  Anemia   DIAGNOSIS:  Anemia  HISTORY OF PRESENT ILLNESS:   This is a 42 year old Caucasian female with a past medical history significant for left psoas abcess- MRSA, HTN, GERD, tobacco abuse, acute renal failure, hyperlipidemia, anxiety and depression, PUD, gastroparesis, psoriasis, arthritis, migraines, and IV drug abuse who was admitted to the hospital for anemia and rectal bleeding.   The patient was seen by GI and they are following for rectal bleeding.   Chart is reviewed.   [name] was diagnosed with a MRSA abscess of the left psoas area requiring percutaneous draining by IR and IV Vancomycin x 4 weeks.  She reported to the ED for rectal bleeding.  On further discussion, [name] reports she started her menstrual cycle about 4-5 days ago and this bleeding could be confused for rectal bleeding.  She was fecal heme negative in the ED.  In the ED, her Hgb was noted to be 6.0 g/dL.  She was ordered to units of PRBCs but could not complete the transfusion due to fevers, shakes, and rigors.  This transfusion was therefore D/C'd.  Blood cultures were ascertained and she is noted to have Gram - Bacteremia.   [name] is not completely forthcoming with all information and she does not freely provide information.  She reports that she does abuse crack/cocaine.  She also abuse opiates.  She last crushed Opana and injected through her PICC line that is maintained for her IV Vancomycin infusions at home for her abscess treatment.  Her last injected Opana on Saturday.  She also admits to tobacco abuse of 1 ppd x 25 years.  She does not know what caused her to transition her drug of choice from tobacco to illicit drugs.  "Sir, if I knew that I would not have started."  I personally reviewed and  went over laboratory results with the patient.  Will repeat labs tomorrow AM to verify results.    She denies any active bleeding presently.  She does admit to some nausea presently and she has antiemetics ordered.  She reports that she has not had a BM< since admission, but she does not feel constipated.  She otherwise denies any complaints including fevers, chills, night sweats, headaches, dizziness, double vision, diarrhea, abdominal pain, black tarry stool, vomiting, chest pain, back pain, urinary pain/burning/frequency.    PAST MEDICAL HISTORY:   Past Medical History  Diagnosis Date  . Hypertension   . Depressed   . Drug addiction Hep C  . Hepatitis C     pt denies any treatment  . Cancer   . Cervical cancer   . Renal disorder     kidney stones  . Gastroparesis   . Gastroesophageal reflux disease     ALLERGIES: Allergies  Allergen Reactions  . Tramadol Hcl Anaphylaxis and Swelling    Of tongue  . Ketorolac Tromethamine Nausea And Vomiting  . Propoxyphene Napsylate Rash      MEDICATIONS: I have reviewed the patient's current medications.     PAST SURGICAL HISTORY Past Surgical History  Procedure Laterality Date  . Appendectomy    . Tubal ligation    . Mandible surgery for fracture    . Esophagogastroduodenoscopy  01/04/2008    Dr Teddy Spike reflux esophagitis, sm HH, gastritis, BRAVO  positive for GERD on BID PPI    FAMILY HISTORY: History reviewed. No pertinent family history.  SOCIAL HISTORY:  reports that she has been smoking Cigarettes.  She has a 15 pack-year smoking history. She does not have any smokeless tobacco history on file. She reports that she uses illicit drugs (Cocaine, IV, and "Crack" cocaine). She reports that she does not drink alcohol.  PERFORMANCE STATUS: The patient's performance status is 1 - Symptomatic but completely ambulatory  PHYSICAL EXAM: Most Recent Vital Signs: Blood pressure 93/56, pulse 78, temperature 97.2 F (36.2 C),  temperature source Oral, resp. rate 20, height 5\' 2"  (1.575 m), weight 160 lb (72.576 kg), last menstrual period 09/06/2012, SpO2 98.00%. General appearance: alert, cooperative, appears older than stated age, no distress and not pleasant, flat affect Head: Normocephalic, without obvious abnormality, atraumatic Neck: supple, symmetrical, trachea midline Lungs: clear to auscultation bilaterally Heart: regular rate and rhythm, S1, S2 normal, no murmur, click, rub or gallop Abdomen: soft, non-tender; bowel sounds normal; no masses,  no organomegaly Extremities: extremities normal, atraumatic, no cyanosis or edema Skin: Skin color, texture, turgor normal. No rashes or lesions Neurologic: Grossly normal  LABORATORY DATA:  Results for orders placed during the hospital encounter of 09/06/12 (from the past 48 hour(s))  OCCULT BLOOD, POC DEVICE     Status: None   Collection Time    09/06/12  1:13 PM      Result Value Range   Fecal Occult Bld NEGATIVE  NEGATIVE  CBC WITH DIFFERENTIAL     Status: Abnormal   Collection Time    09/06/12  1:14 PM      Result Value Range   WBC 8.6  4.0 - 10.5 K/uL   RBC 2.05 (*) 3.87 - 5.11 MIL/uL   Hemoglobin 6.0 (*) 12.0 - 15.0 g/dL   Comment: CRITICAL RESULT CALLED TO, READ BACK BY AND VERIFIED WITH:     MEADOWS, G. AT 14:07PM ON 09/06/12 BY PRUITT, C.   HCT 18.7 (*) 36.0 - 46.0 %   MCV 91.2  78.0 - 100.0 fL   MCH 29.3  26.0 - 34.0 pg   MCHC 32.1  30.0 - 36.0 g/dL   RDW 16.1 (*) 09.6 - 04.5 %   Platelets 149 (*) 150 - 400 K/uL   Neutrophils Relative 50  43 - 77 %   Lymphocytes Relative 38  12 - 46 %   Monocytes Relative 8  3 - 12 %   Eosinophils Relative 3  0 - 5 %   Basophils Relative 1  0 - 1 %   Neutro Abs 4.2  1.7 - 7.7 K/uL   Lymphs Abs 3.3  0.7 - 4.0 K/uL   Monocytes Absolute 0.7  0.1 - 1.0 K/uL   Eosinophils Absolute 0.3  0.0 - 0.7 K/uL   Basophils Absolute 0.1  0.0 - 0.1 K/uL   WBC Morphology       Value: MODERATE LEFT SHIFT (>5% METAS AND  MYELOS,OCC PRO NOTED)   Comment: ATYPICAL LYMPHOCYTES  TSH     Status: Abnormal   Collection Time    09/06/12  1:24 PM      Result Value Range   TSH 7.170 (*) 0.350 - 4.500 uIU/mL  VITAMIN B12     Status: None   Collection Time    09/06/12  1:24 PM      Result Value Range   Vitamin B-12 511  211 - 911 pg/mL  FOLATE     Status: None  Collection Time    09/06/12  1:24 PM      Result Value Range   Folate 15.0     Comment: (NOTE)     Reference Ranges            Deficient:       0.4 - 3.3 ng/mL            Indeterminate:   3.4 - 5.4 ng/mL            Normal:              > 5.4 ng/mL  IRON AND TIBC     Status: None   Collection Time    09/06/12  1:24 PM      Result Value Range   Iron 99  42 - 135 ug/dL   TIBC 960  454 - 098 ug/dL   Saturation Ratios 31  20 - 55 %   UIBC 217  125 - 400 ug/dL  FERRITIN     Status: Abnormal   Collection Time    09/06/12  1:24 PM      Result Value Range   Ferritin 315 (*) 10 - 291 ng/mL  RETICULOCYTES     Status: Abnormal   Collection Time    09/06/12  1:24 PM      Result Value Range   Retic Ct Pct 16.0 (*) 0.4 - 3.1 %   RBC. 2.04 (*) 3.87 - 5.11 MIL/uL   Retic Count, Manual 326.4 (*) 19.0 - 186.0 K/uL  HAPTOGLOBIN     Status: Abnormal   Collection Time    09/06/12  1:24 PM      Result Value Range   Haptoglobin <25 (*) 45 - 215 mg/dL  LACTATE DEHYDROGENASE     Status: Abnormal   Collection Time    09/06/12  1:24 PM      Result Value Range   LDH 427 (*) 94 - 250 U/L  HEPATIC FUNCTION PANEL     Status: Abnormal   Collection Time    09/06/12  1:24 PM      Result Value Range   Total Protein 7.5  6.0 - 8.3 g/dL   Albumin 3.3 (*) 3.5 - 5.2 g/dL   AST 20  0 - 37 U/L   ALT 11  0 - 35 U/L   Alkaline Phosphatase 46  39 - 117 U/L   Total Bilirubin 0.4  0.3 - 1.2 mg/dL   Bilirubin, Direct 0.1  0.0 - 0.3 mg/dL   Indirect Bilirubin 0.3  0.3 - 0.9 mg/dL  BASIC METABOLIC PANEL     Status: Abnormal   Collection Time    09/06/12  1:45 PM       Result Value Range   Sodium 137  135 - 145 mEq/L   Potassium 3.4 (*) 3.5 - 5.1 mEq/L   Chloride 101  96 - 112 mEq/L   CO2 26  19 - 32 mEq/L   Glucose, Bld 95  70 - 99 mg/dL   BUN 18  6 - 23 mg/dL   Creatinine, Ser 1.19 (*) 0.50 - 1.10 mg/dL   Calcium 9.1  8.4 - 14.7 mg/dL   GFR calc non Af Amer 48 (*) >90 mL/min   GFR calc Af Amer 55 (*) >90 mL/min   Comment:            The eGFR has been calculated     using the CKD EPI equation.     This calculation has  not been     validated in all clinical     situations.     eGFR's persistently     <90 mL/min signify     possible Chronic Kidney Disease.  PREPARE RBC (CROSSMATCH)     Status: None   Collection Time    09/06/12  3:21 PM      Result Value Range   Order Confirmation ORDER PROCESSED BY BLOOD BANK    TYPE AND SCREEN     Status: None   Collection Time    09/06/12  3:25 PM      Result Value Range   ABO/RH(D) A POS     Antibody Screen NEG     Sample Expiration 09/09/2012     Unit Number [id number]     Blood Component Type RED CELLS,LR     Unit division 00     Status of Unit ALLOCATED     Transfusion Status OK TO TRANSFUSE     Crossmatch Result Compatible     Unit Number [id number]     Blood Component Type RED CELLS,LR     Unit division 00     Status of Unit ISSUED,FINAL     Transfusion Status OK TO TRANSFUSE     Crossmatch Result Compatible    ABO/RH     Status: None   Collection Time    09/06/12  3:25 PM      Result Value Range   ABO/RH(D) A POS    URINALYSIS, ROUTINE W REFLEX MICROSCOPIC     Status: Abnormal   Collection Time    09/06/12  7:15 PM      Result Value Range   Color, Urine YELLOW  YELLOW   APPearance CLEAR  CLEAR   Specific Gravity, Urine 1.015  1.005 - 1.030   pH 6.0  5.0 - 8.0   Glucose, UA 100 (*) NEGATIVE mg/dL   Hgb urine dipstick TRACE (*) NEGATIVE   Bilirubin Urine NEGATIVE  NEGATIVE   Ketones, ur NEGATIVE  NEGATIVE mg/dL   Protein, ur NEGATIVE  NEGATIVE mg/dL   Urobilinogen, UA 0.2   0.0 - 1.0 mg/dL   Nitrite NEGATIVE  NEGATIVE   Leukocytes, UA NEGATIVE  NEGATIVE  URINE MICROSCOPIC-ADD ON     Status: Abnormal   Collection Time    09/06/12  7:15 PM      Result Value Range   Squamous Epithelial / LPF FEW (*) RARE   WBC, UA 7-10  <3 WBC/hpf   RBC / HPF 3-6  <3 RBC/hpf   Casts GRANULAR CAST (*) NEGATIVE  CULTURE, BLOOD (ROUTINE X 2)     Status: None   Collection Time    09/06/12 10:53 PM      Result Value Range   Specimen Description BLOOD LEFT ANTECUBITAL     Special Requests BOTTLES DRAWN AEROBIC AND ANAEROBIC 8CC     Culture       Value: GRAM NEGATIVE RODS     Gram Stain Report Called to,Read Back By and Verified With: MORRIS,C. AT 1247 ON 09/07/2012 BY BAUGHAM,M.     Performed at [facility]   Report Status PENDING    CULTURE, BLOOD (ROUTINE X 2)     Status: None   Collection Time    09/06/12 11:00 PM      Result Value Range   Specimen Description BLOOD LEFT HAND     Special Requests BOTTLES DRAWN AEROBIC AND ANAEROBIC 6CC     Culture  Value: GRAM NEGATIVE RODS     Gram Stain Report Called to,Read Back By and Verified With: MORRIS,C. AT 1247 ON 09/07/2012 BY BAUGHAM,M.     Performed at [facility]   Report Status PENDING    BASIC METABOLIC PANEL     Status: Abnormal   Collection Time    09/07/12  5:04 AM      Result Value Range   Sodium 135  135 - 145 mEq/L   Potassium 3.9  3.5 - 5.1 mEq/L   Chloride 104  96 - 112 mEq/L   CO2 21  19 - 32 mEq/L   Glucose, Bld 124 (*) 70 - 99 mg/dL   BUN 20  6 - 23 mg/dL   Creatinine, Ser 1.19 (*) 0.50 - 1.10 mg/dL   Calcium 8.2 (*) 8.4 - 10.5 mg/dL   GFR calc non Af Amer 36 (*) >90 mL/min   GFR calc Af Amer 42 (*) >90 mL/min   Comment:            The eGFR has been calculated     using the CKD EPI equation.     This calculation has not been     validated in all clinical     situations.     eGFR's persistently     <90 mL/min signify     possible Chronic Kidney Disease.  CBC     Status:  Abnormal   Collection Time    09/07/12  5:04 AM      Result Value Range   WBC 21.0 (*) 4.0 - 10.5 K/uL   RBC 2.38 (*) 3.87 - 5.11 MIL/uL   Hemoglobin 7.1 (*) 12.0 - 15.0 g/dL   HCT 14.7 (*) 82.9 - 56.2 %   MCV 91.2  78.0 - 100.0 fL   MCH 29.8  26.0 - 34.0 pg   MCHC 32.7  30.0 - 36.0 g/dL   RDW 13.0 (*) 86.5 - 78.4 %   Platelets 127 (*) 150 - 400 K/uL  MRSA PCR SCREENING     Status: None   Collection Time    09/07/12  9:27 AM      Result Value Range   MRSA by PCR NEGATIVE  NEGATIVE   Comment:            The GeneXpert MRSA Assay (FDA     approved for NASAL specimens     only), is one component of a     comprehensive MRSA colonization     surveillance program. It is not     intended to diagnose MRSA     infection nor to guide or     monitor treatment for     MRSA infections.      RADIOGRAPHY: Dg Chest 1 View  09/07/2012  *RADIOLOGY REPORT*  Clinical Data: Fever.  CHEST - 1 VIEW  Comparison: 08/19/2012  Findings: The right-sided PICC line is poorly visualized centrally. It may terminate more inferiorly, over the mid right atrium.  Normal heart size.  No pleural effusion or pneumothorax.  Clear lungs.  IMPRESSION: No acute cardiopulmonary disease.  Right-sided PICC line poorly visualized centrally.  May terminate over the mid right atrium.  Consider PA and lateral plain film correlation.   Original Report Authenticated By: Jeronimo Greaves, M.D.    Ct Abdomen Pelvis W Contrast  09/07/2012  *RADIOLOGY REPORT*  Clinical Data: Abdominal pain. Evaluate abscess.  CT ABDOMEN AND PELVIS WITH CONTRAST  Technique:  Multidetector CT imaging of the abdomen and  pelvis was performed following the standard protocol during bolus administration of intravenous contrast.  Contrast: 1 OMNIPAQUE IOHEXOL 300 MG/ML  SOLN, 80mL OMNIPAQUE IOHEXOL 300 MG/ML  SOLN  Comparison: 08/24/2012.  Findings: The sella as abscess drainage catheter has been removed. There is minimal residual thickening of the left psoas muscle  but no discrete fluid collection to suggest a residual or recurrent abscess.  Both kidneys demonstrate normal perfusion.  No findings for pyelonephritis or renal abscess.  No hydronephrosis or obstructing ureteral calculi.  The liver is stable.  Stable hemangioma in the right hepatic lobe near the IVC.  The spleen is normal and stable.  The pancreas and adrenal glands are unremarkable and unchanged.  Stable borderline retroperitoneal lymph nodes, likely inflammatory.  The pelvis is unremarkable and stable.  The stomach, duodenum, small bowel and colon unremarkable and unchanged.  IMPRESSION: Resolution of the left psoas abscess without recurrent fluid collection/abscess.  Mild stable thickening of the muscle.   Original Report Authenticated By: Rudie Meyer, M.D.        ASSESSMENT:  1. Severe anemia, S/P 1 unit PRBC on 09/07/12.  2nd unit held due to fevers and rigors. 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  Reticulocyte count is indicative of compensatory mechanism 2. Gram Negative Bacteremia, on Zosyn IV 3. Rectal bleeding versus menses 4. Mild thrombocytopenia 5. Elevated LDH 6. Decreased Haptoglobin 7. Leukocytosis, secondary to #2 8. Elevated RDW 9. Left psoas MRSA abscess requiring percutaneous drainage and 4 weeks of IV Vancomycin requiring PICC line.  10. Renal insufficiency 11. IV drug abuse, Opana and Crack/Cocaine, assisted with PICC line for #9 12. Tobacco abuse 25 pack year 13. HTN 14. GERD 15. Hyperlipidemia 16. Anxiety and depression 17. PUD 18. Gastroparesis 19. Psoriasis, since the age of 48, treated with OTC Salve 20. Arthritis 21. Migraines    PLAN:  1. Chart reviewed 2. I personally reviewed and went over laboratory results with the patient. 3. Labs tomorrow AM: CBC diff, Retic count, LDH, DAT, Haptoglobin, APTT, PT/INR 4. Will continue to follow closely as an inpatient.  All questions were answered. The patient knows to call the clinic with any problems, questions  or concerns. We can certainly see the patient much sooner if necessary.  The patient and plan discussed with Glenford Peers, MD and he is in agreement with the aforementioned.  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  She may have an element of anemia of chronic disease if her HGB does not return to normal

## 2012-09-07 NOTE — Consult Note (Addendum)
REVIEWED. AGREE.  NOV 2013: HB 12.9-NO ACUTE INDICATION FOR EGD/TCS AT THIS TIME. CONSIDER PRIOR TO DISCHARGE WITH PROPOFOL DUE TO IV DRUG ABUSE.

## 2012-09-07 NOTE — Progress Notes (Signed)
UR Chart Review Completed  

## 2012-09-07 NOTE — Consult Note (Signed)
Referring Provider: Dr Kerry Hough Primary Care Physician:  No primary provider on file. Primary Gastroenterologist:  Dr. Jonette Eva  Reason for Consultation:  Anemia, ? rectal bleeding  HPI: [name] is a 42 y.o. female admitted with profound anemia.  Hgb 6..6.  She has had 4 weeks treatment with IV vancomycin for recent left psoas abscess.  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  2 units PRBCs was ordered however transfusion was discontinued due to fever, rigors & suspected transfusion reaction vs. drug withdrawals.  Hgb 7.1 this morning.  WBC 21K.  She reports she noticed dark blood with wiping on toilet tissue, but felt it may be due to her period.  Her LMP started 4 days ago.  She states it has not been heavy.  C/o heartburn a couple times per week.  Takes TUMS OTC.  Takes Prilosec 20mg  as needed.  Denies diarrhea & constipation.  States she vomited once last night.  Denies any nausea or vomiting at home.  She has never had a colonoscopy.  She reports taking Aleve twice weekly for back pain.  ER rectal exam revealed heme negative stool & hemorrhoids.  Reports her weight has dropped a few pounds.  She denies any abdominal pain.  #1 hemoccult negative.  08/24/12 CT A/P with IV contrast-Left psoas muscle drainage catheter in place with almost complete resolution of the previously noted left psoas muscle abscess. What remains may represent residual infection or reaction to infection.  Decreased displacement of the left kidney. Very minimal haziness of the left kidney may be normal. Pyelonephritis not entirely excluded in the proper clinical setting. There remain scattered increased number of normal size to slightly  enlarged lymph nodes retroperitoneal and pelvic region. It is possible these lymph nodes are reactive in origin rather than related to malignancy.  She has hx of gastroparesis, non-h pylori gastritis & erosive reflux esophagitis.  Unfortunately, she was not a candidate for  Nissen fundoplication back in 2009 after Bravo showed poorly controlled GERD on BID PPI due to potential gas/bloat syndrome.  She was previously on reglan & BID PPI for gastroparesis.    Past Medical History  Diagnosis Date  . Hypertension   . Depressed   . Drug addiction Hep C  . Hepatitis C     pt denies any treatment  . Cancer   . Cervical cancer   . Renal disorder     kidney stones  . Gastroparesis   . Gastroesophageal reflux disease     Past Surgical History  Procedure Laterality Date  . Appendectomy    . Tubal ligation    . Mandible surgery for fracture    . Esophagogastroduodenoscopy  01/04/2008    Dr Teddy Spike reflux esophagitis, sm HH, gastritis, BRAVO positive for GERD on BID PPI    Prior to Admission medications   Medication Sig Start Date End Date Taking? Authorizing Provider  clonazePAM (KLONOPIN) 1 MG tablet Take 1 mg by mouth 3 (three) times daily as needed for anxiety. For anxiety   Yes Historical Provider, MD  lisinopril-hydrochlorothiazide (PRINZIDE,ZESTORETIC) 10-12.5 MG per tablet Take 1 tablet by mouth daily.   Yes Historical Provider, MD  sennosides-docusate sodium (SENOKOT-S) 8.6-50 MG tablet Take 1 tablet by mouth daily as needed for constipation. 08/08/12  Yes Nimish Normajean Glasgow, MD  sodium chloride 0.9 % SOLN 500 mL with vancomycin 10 G SOLR Inject 1,500 mg into the vein daily. 08/08/12  Yes Nimish Normajean Glasgow, MD    Current  Facility-Administered Medications  Medication Dose Route Frequency Provider Last Rate Last Dose  . 0.9 %  sodium chloride infusion  250 mL Intravenous PRN Erick Blinks, MD      . acetaminophen (TYLENOL) tablet 650 mg  650 mg Oral Q6H PRN Erick Blinks, MD       Or  . acetaminophen (TYLENOL) suppository 650 mg  650 mg Rectal Q6H PRN Erick Blinks, MD      . clonazePAM (KLONOPIN) tablet 1 mg  1 mg Oral TID PRN Erick Blinks, MD   1 mg at 09/06/12 1822  . cloNIDine (CATAPRES) tablet 0.1 mg  0.1 mg Oral TID Edsel Petrin,  DO   0.1 mg at 09/06/12 2359  . LORazepam (ATIVAN) injection 1 mg  1 mg Intravenous Q4H PRN Edsel Petrin, DO   1 mg at 09/06/12 2358  . ondansetron (ZOFRAN) tablet 4 mg  4 mg Oral Q6H PRN Erick Blinks, MD       Or  . ondansetron (ZOFRAN) injection 4 mg  4 mg Intravenous Q6H PRN Erick Blinks, MD   4 mg at 09/06/12 2102  . pantoprazole (PROTONIX) injection 40 mg  40 mg Intravenous Q12H Erick Blinks, MD   40 mg at 09/06/12 2103  . senna-docusate (Senokot-S) tablet 1 tablet  1 tablet Oral Daily PRN Erick Blinks, MD      . sodium chloride 0.9 % injection 3 mL  3 mL Intravenous Q12H Erick Blinks, MD   3 mL at 09/07/12 0001  . sodium chloride 0.9 % injection 3 mL  3 mL Intravenous PRN Erick Blinks, MD   3 mL at 09/07/12 0755    Allergies as of 09/06/2012 - Review Complete 09/06/2012  Allergen Reaction Noted  . Tramadol hcl Anaphylaxis and Swelling   . Ketorolac tromethamine Nausea And Vomiting   . Propoxyphene napsylate Rash 08/12/2007   Family History:  There is no known family history of colorectal carcinoma , liver disease, or inflammatory bowel disease.  History   Social History  . Marital Status: Divorced    Spouse Name: N/A    Number of Children: 2  . Years of Education: N/A   Occupational History  . disabled    Social History Main Topics  . Smoking status: Current Every Day Smoker -- 1.00 packs/day for 15 years    Types: Cigarettes  . Smokeless tobacco: Not on file  . Alcohol Use: No  . Drug Use: Yes    Special: Cocaine, IV, "Crack" cocaine     Comment: OPANA IV-last used a couple mo ago  . Sexually Active: Yes    Birth Control/ Protection: None, Surgical   Other Topics Concern  . Not on file   Social History Narrative   Lives w/ mother & 2 children (17 &13)   Review of Systems: Gen: see HPI CV: Denies chest pain, angina, palpitations, syncope, orthopnea, PND, peripheral edema, and claudication. Resp: Denies dyspnea at rest, dyspnea with exercise,  cough, sputum, wheezing, coughing up blood, and pleurisy. GI: Denies vomiting blood, jaundice, and fecal incontinence.   Denies dysphagia or odynophagia. GU : Denies urinary burning, blood in urine, urinary frequency, urinary hesitancy, nocturnal urination, and urinary incontinence. MS: Chronic back pain  Derm: Denies rash, itching, dry skin, hives, moles, warts, or unhealing ulcers.  Psych: Denies depression, anxiety, memory loss, suicidal ideation, hallucinations, paranoia, and confusion. Heme: Denies bruising or enlarged lymph nodes. Neuro:  Denies any headaches, dizziness, paresthesias. Endo:  Denies any problems with DM, thyroid, adrenal  function.  Physical Exam: Vital signs in last 24 hours: Temp:  [97.2 F (36.2 C)-101.5 F (38.6 C)] 97.2 F (36.2 C) (02/17 0519) Pulse Rate:  [78-135] 78 (02/17 0519) Resp:  [18-20] 20 (02/17 0519) BP: (94-144)/(53-87) 94/53 mmHg (02/17 0519) SpO2:  [9 %-100 %] 98 % (02/17 0519) Weight:  [160 lb (72.576 kg)] 160 lb (72.576 kg) (02/16 1732) Last BM Date: 09/05/12 Patient's last menstrual period was 09/06/2012. General:   Alert,  Well-developed, pale, pleasant and cooperative in NAD Head:  Normocephalic and atraumatic. Eyes:  Sclera clear, no icterus.   Conjunctiva pink. Ears:  Normal auditory acuity. Nose:  No deformity, discharge, or lesions. Mouth:  No deformity or lesions,oropharynx pink & moist. Neck:  Supple; no masses or thyromegaly. Lungs:  Clear throughout to auscultation.   No wheezes, crackles, or rhonchi. No acute distress. Heart:  Regular rate and rhythm; no murmurs, clicks, rubs,  or gallops. Abdomen:  Normal bowel sounds.  No bruits.  Soft, non-tender and non-distended without masses, hepatosplenomegaly or hernias noted.  No guarding or rebound tenderness.   Rectal:  Deferred. Msk:  Symmetrical without gross deformities.  Pulses:  Normal pulses noted. Extremities:  No edema. Neurologic:  Alert and oriented x4;  grossly normal  neurologically. Skin:  Intact without significant lesions or rashes. Lymph Nodes:  No significant cervical adenopathy. Psych:  Alert and cooperative. Normal mood and affect.  Intake/Output from previous day: 02/16 0701 - 02/17 0700 In: 120 [P.O.:120] Out: -  Intake/Output this shift:    Lab Results:  Recent Labs  09/06/12 1314 09/07/12 0504  WBC 8.6 21.0*  HGB 6.0* 7.1*  HCT 18.7* 21.7*  PLT 149* 127*   BMET  Recent Labs  09/06/12 1345 09/07/12 0504  NA 137 135  K 3.4* 3.9  CL 101 104  CO2 26 21  GLUCOSE 95 124*  BUN 18 20  CREATININE 1.36* 1.72*  CALCIUM 9.1 8.2*   LFT  Recent Labs  09/06/12 1324  PROT 7.5  ALBUMIN 3.3*  AST 20  ALT 11  ALKPHOS 46  BILITOT 0.4  BILIDIR 0.1  IBILI 0.3   Studies/Results: Dg Chest 1 View  09/07/2012  *RADIOLOGY REPORT*  Clinical Data: Fever.  CHEST - 1 VIEW  Comparison: 08/19/2012  Findings: The right-sided PICC line is poorly visualized centrally. It may terminate more inferiorly, over the mid right atrium.  Normal heart size.  No pleural effusion or pneumothorax.  Clear lungs.  IMPRESSION: No acute cardiopulmonary disease.  Right-sided PICC line poorly visualized centrally.  May terminate over the mid right atrium.  Consider PA and lateral plain film correlation.   Original Report Authenticated By: Jeronimo Greaves, M.D.     Impression: [name] is a pleasant 42 y.o. female with hx erosive esophagitis, gastritis, & gastroparesis who presents with profound anemia (hgb was 6.6).  Hemoccult negative since admission.  She may have seen dark blood with wiping, but may have been due to menstruation as pt is unclear.  At this point it is difficult to determine if she has acute GI blood loss or hemolytic anemia.  Possible transfusion reaction & IV drug use withdrawals are complicating her course.  No GI complaints at this time except heartburn twice weekly & she takes PRN PPI & TUMS.    Plan: 1. FU hemoccults 2. FU anemia  panel 3. Monitor for gross GI bleeding 4. Agree w/ BID PPI 5. Consider EGD +/- colonoscopy w/ propofol given hx IV drug use if heme  positive or evidence of IDA  6. CBC AM   LOS: 1 day   Lorenza Burton  09/07/2012, 9:00 AM [facility]

## 2012-09-07 NOTE — Progress Notes (Addendum)
[facility] Night Coverage Progress Note  Ms. Lambing developed acute severe agitation, rigors, chills and spiked a temp 101.5 overnight. Signs of opiate withdrawal, chills, lacrimation, nausea, agitation, restlesness, hypotension. She admits to IV drug injection and opiate adiction, she was sent home with a PICC line for home IV ABX fo psoas abscess.    Obtained stat blood cultures, suspect possible bacteremia from IV drug use  She may have a hemolytic anemia from IV drugs   Held blood transfusion, may have been a mild transfusion reaction as well.  Gave limited dose of Ativan and Clonidine for withdrawal, no opiates-she needs specialize treatment center to get methadone or suboxone-legally we cannot prescribe without special approval for withdrawal or addicton.   Need to re-scan her belly to make sure there is not a retroperitoneal hematoma or another abscess.   Anderson Malta, DO Internal Medicine

## 2012-09-07 NOTE — Progress Notes (Signed)
[facility] PROGRESS NOTE  [name] WUJ:[id number] DOB: 1971-01-15 DOA: 09/06/2012 PCP: No primary provider on file.  Assessment/Plan: 1. Anemia with concerns of GI bleeding. Patient did have some blood in her stools. Hg 7 s/p 1 units PRBC's. Developed fever, reaction. 2nd unit cancelled. Appreciate GI consultation. ROBT neg. No further bloody stool. Will continue proton pump inhibitors. LDH 427, anemia panel, TSH pending. Will continue PPI. Will request heme consult as concern for ? Hemolytic anemia.  2. Hypokalemia. Replaced and resolved. Will monitor.  3. Hypertension. Stable 4. GERD. Continue proton pump inhibitors. See GI note 5. Low back pain. Chronic issue. Patient was informed that she'll not receive any opiates. 6. Recent psoas abscess. Spiked temp last evening. WC up to 21. Non-toxic appearing.  Will repeat CT scan to evaluate possible infectious process. She still has PICC line in place. Of note, completed more than 4 weeks of IV vancomycin. We will discontinue vancomycin. Her PICC line may be discontinued prior to discharge.     Code Status: full Family Communication:  Disposition Plan: home when ready   Consultants:  GI  Hemotology  Procedures:  none  Antibiotics:  none  HPI/Subjective: Awake alert. Reports "feel like i am withdrawing". Complains low back pain  Objective: Filed Vitals:   09/06/12 2152 09/06/12 2312 09/07/12 0519 09/07/12 1112  BP: 125/79 120/75 94/53 93/56   Pulse: 119 135 78   Temp: 101.5 F (38.6 C) 100.7 F (38.2 C) 97.2 F (36.2 C)   TempSrc: Oral Oral    Resp: 20 20 20    Height:      Weight:      SpO2: 97% 96% 98%     Intake/Output Summary (Last 24 hours) at 09/07/12 1254 Last data filed at 09/06/12 1800  Gross per 24 hour  Intake    120 ml  Output      0 ml  Net    120 ml   Filed Weights   09/06/12 1231 09/06/12 1732  Weight: 72.576 kg (160 lb) 72.576 kg (160 lb)    Exam:   General:  Awake alert  NAD  Cardiovascular: RRR No MGR No LEE  Respiratory: normal effort BSCTAB No wheeze  Abdomen: soft non-tender to palpation. +BS  Data Reviewed: Basic Metabolic Panel:  Recent Labs Lab 09/02/12 1037 09/06/12 1345 09/07/12 0504  NA 135 137 135  K 3.4* 3.4* 3.9  CL 102 101 104  CO2 23 26 21   GLUCOSE 107* 95 124*  BUN 19 18 20   CREATININE 1.56* 1.36* 1.72*  CALCIUM 8.1* 9.1 8.2*   Liver Function Tests:  Recent Labs Lab 09/06/12 1324  AST 20  ALT 11  ALKPHOS 46  BILITOT 0.4  PROT 7.5  ALBUMIN 3.3*   No results found for this basename: LIPASE, AMYLASE,  in the last 168 hours No results found for this basename: AMMONIA,  in the last 168 hours CBC:  Recent Labs Lab 09/02/12 1037 09/06/12 1314 09/07/12 0504  WBC 12.4* 8.6 21.0*  NEUTROABS 9.3* 4.2  --   HGB 6.1* 6.0* 7.1*  HCT 18.3* 18.7* 21.7*  MCV 88.4 91.2 91.2  PLT 114* 149* 127*   Cardiac Enzymes: No results found for this basename: CKTOTAL, CKMB, CKMBINDEX, TROPONINI,  in the last 168 hours BNP (last 3 results) No results found for this basename: PROBNP,  in the last 8760 hours CBG: No results found for this basename: GLUCAP,  in the last 168 hours  Recent Results (from the past 240 hour(s))  CULTURE, BLOOD (ROUTINE X 2)     Status: None   Collection Time    09/06/12 10:53 PM      Result Value Range Status   Specimen Description BLOOD LEFT ANTECUBITAL   Final   Special Requests BOTTLES DRAWN AEROBIC AND ANAEROBIC 8CC   Final   Culture NO GROWTH 1 DAY   Final   Report Status PENDING   Incomplete  CULTURE, BLOOD (ROUTINE X 2)     Status: None   Collection Time    09/06/12 11:00 PM      Result Value Range Status   Specimen Description BLOOD LEFT HAND   Final   Special Requests BOTTLES DRAWN AEROBIC AND ANAEROBIC 6CC   Final   Culture NO GROWTH 1 DAY   Final   Report Status PENDING   Incomplete     Studies: Dg Chest 1 View  09/07/2012  *RADIOLOGY REPORT*  Clinical Data: Fever.  CHEST - 1 VIEW   Comparison: 08/19/2012  Findings: The right-sided PICC line is poorly visualized centrally. It may terminate more inferiorly, over the mid right atrium.  Normal heart size.  No pleural effusion or pneumothorax.  Clear lungs.  IMPRESSION: No acute cardiopulmonary disease.  Right-sided PICC line poorly visualized centrally.  May terminate over the mid right atrium.  Consider PA and lateral plain film correlation.   Original Report Authenticated By: Jeronimo Greaves, M.D.     Scheduled Meds: . cloNIDine  0.1 mg Oral TID  . iohexol  50 mL Oral Q1 Hr x 2  . pantoprazole (PROTONIX) IV  40 mg Intravenous Q12H  . sodium chloride  3 mL Intravenous Q12H   Continuous Infusions:   Principal Problem:   Anemia Active Problems:   HYPERTENSION   GERD   LOW BACK PAIN   Tobacco abuse   Hypokalemia   Blood in stool    Time spent: 30 minutes    [facility] M  [facility]  If 8PM-8AM, please contact night-coverage at [email or web address], password [facility] 09/07/2012, 12:54 PM  LOS: 1 day   Attending note:  Patient seen and examined. Above note reviewed.  Patient had a fever spike overnight. Her blood cultures today indicate gram-negative bacteria, 2 out of 2 blood cultures. She'll be started on Zosyn for gram-negative coverage. Source of bacteremia is likely her PICC line. We will try to secure peripheral IV and discontinue her PICC line. We will send the tip for culture. We will also check urine for any infectious process. She does not appear to have any pneumonia and her chest x-ray is unremarkable. She is complaining of back pain. We will repeat CT abdomen and pelvis to rule out any residual abscess.  Patient is having significant nausea and vomiting. This is likely related to opiate withdrawal. She'll be given Ativan and clonidine as well as antiemetics. Tramadol for pain  Regarding her anemia. She did receive 1 unit of PRBC. Her hemoglobin has responded appropriately. The second unit was not  transfused due to development of fever. Her LDH is mildly elevated. Anemia panel is currently pending. She's not had any further blood in her stools. Appreciate GI input. Question if she has an underlying hemolytic process. We will request a hematology consult and request peripheral smear. Will advance diet today since it does not appear she is going for any endoscopic evaluation today.  She's also does an acute renal failure. We'll discontinue Toradol and lisinopril. Continue to follow renal function.

## 2012-09-07 NOTE — Progress Notes (Signed)
ANTIBIOTIC CONSULT NOTE - INITIAL  Pharmacy Consult for Zosyn Indication: + blood cultures 2/2  Allergies  Allergen Reactions  . Tramadol Hcl Anaphylaxis and Swelling    Of tongue  . Ketorolac Tromethamine Nausea And Vomiting  . Propoxyphene Napsylate Rash   Patient Measurements: Height: 5\' 2"  (157.5 cm) Weight: 160 lb (72.576 kg) IBW/kg (Calculated) : 50.1  Vital Signs: Temp: 97.2 F (36.2 C) (02/17 0519) BP: 93/56 mmHg (02/17 1112) Pulse Rate: 78 (02/17 0519) Intake/Output from previous day: 02/16 0701 - 02/17 0700 In: 120 [P.O.:120] Out: -  Intake/Output from this shift:    Labs:  Recent Labs  09/06/12 1314 09/06/12 1345 09/07/12 0504  WBC 8.6  --  21.0*  HGB 6.0*  --  7.1*  PLT 149*  --  127*  CREATININE  --  1.36* 1.72*   Estimated Creatinine Clearance: 40.2 ml/min (by C-G formula based on Cr of 1.72). No results found for this basename: VANCOTROUGH, Leodis Binet, VANCORANDOM, GENTTROUGH, GENTPEAK, GENTRANDOM, TOBRATROUGH, TOBRAPEAK, TOBRARND, AMIKACINPEAK, AMIKACINTROU, AMIKACIN,  in the last 72 hours   Microbiology: Recent Results (from the past 720 hour(s))  CULTURE, BLOOD (ROUTINE X 2)     Status: None   Collection Time    09/06/12 10:53 PM      Result Value Range Status   Specimen Description BLOOD LEFT ANTECUBITAL   Final   Special Requests BOTTLES DRAWN AEROBIC AND ANAEROBIC 8CC   Final   Culture     Final   Value: GRAM NEGATIVE RODS     Gram Stain Report Called to,Read Back By and Verified With: MORRIS,C. AT 1247 ON 09/07/2012 BY BAUGHAM,M.     Performed at [facility]   Report Status PENDING   Incomplete  CULTURE, BLOOD (ROUTINE X 2)     Status: None   Collection Time    09/06/12 11:00 PM      Result Value Range Status   Specimen Description BLOOD LEFT HAND   Final   Special Requests BOTTLES DRAWN AEROBIC AND ANAEROBIC 6CC   Final   Culture     Final   Value: GRAM NEGATIVE RODS     Gram Stain Report Called to,Read Back By and  Verified With: MORRIS,C. AT 1247 ON 09/07/2012 BY BAUGHAM,M.     Performed at [facility]   Report Status PENDING   Incomplete   Medical History: Past Medical History  Diagnosis Date  . Hypertension   . Depressed   . Drug addiction Hep C  . Hepatitis C     pt denies any treatment  . Cancer   . Cervical cancer   . Renal disorder     kidney stones  . Gastroparesis   . Gastroesophageal reflux disease    Medications:  Scheduled:  . cloNIDine  0.1 mg Oral TID  . [EXPIRED] iohexol  50 mL Oral Q1 Hr x 2  . [COMPLETED] LORazepam  1 mg Intravenous Once  . pantoprazole (PROTONIX) IV  40 mg Intravenous Q12H  . piperacillin-tazobactam (ZOSYN)  IV  3.375 g Intravenous Q8H  . [COMPLETED] potassium chloride  40 mEq Oral Once  . [COMPLETED] sodium chloride  1,000 mL Intravenous Once  . sodium chloride  3 mL Intravenous Q12H  . [DISCONTINUED] hydrochlorothiazide  12.5 mg Oral Daily  . [DISCONTINUED] ibuprofen  800 mg Oral Once  . [DISCONTINUED] lisinopril  10 mg Oral Daily  . [DISCONTINUED] lisinopril-hydrochlorothiazide  1 tablet Oral Daily   Assessment: 42yo female with positive blood cx's with  GNR.  Pt had recent psoas abscess.  Still has PICC in place from course of IV Vancomycin.  SCr is elevated.  Estimated Creatinine Clearance: 40.2 ml/min (by C-G formula based on Cr of 1.72).  Goal of Therapy:  Eradicate infection.  Plan: Zosyn 3.375gm IV q8hrs infuse over 4 hours Monitor labs, renal fxn, and cultures per protocol Duration of therapy per MD  Valrie Hart A 09/07/2012,2:33 PM

## 2012-09-08 DIAGNOSIS — R110 Nausea: Secondary | ICD-10-CM

## 2012-09-08 DIAGNOSIS — R10814 Left lower quadrant abdominal tenderness: Secondary | ICD-10-CM

## 2012-09-08 DIAGNOSIS — D649 Anemia, unspecified: Secondary | ICD-10-CM

## 2012-09-08 LAB — CBC WITH DIFFERENTIAL/PLATELET
Eosinophils Relative: 3 % (ref 0–5)
HCT: 20.6 % — ABNORMAL LOW (ref 36.0–46.0)
Lymphocytes Relative: 40 % (ref 12–46)
Lymphs Abs: 3.4 10*3/uL (ref 0.7–4.0)
MCV: 91.6 fL (ref 78.0–100.0)
Monocytes Absolute: 0.5 10*3/uL (ref 0.1–1.0)
Monocytes Relative: 6 % (ref 3–12)
RBC: 2.25 MIL/uL — ABNORMAL LOW (ref 3.87–5.11)
WBC: 8.7 10*3/uL (ref 4.0–10.5)

## 2012-09-08 LAB — HAPTOGLOBIN: Haptoglobin: 51 mg/dL (ref 45–215)

## 2012-09-08 LAB — DIRECT ANTIGLOBULIN TEST (NOT AT ARMC): DAT, complement: NEGATIVE

## 2012-09-08 LAB — PATHOLOGIST SMEAR REVIEW: Path Review: NORMAL

## 2012-09-08 MED ORDER — POTASSIUM CHLORIDE CRYS ER 20 MEQ PO TBCR
40.0000 meq | EXTENDED_RELEASE_TABLET | ORAL | Status: AC
  Administered 2012-09-08 (×2): 40 meq via ORAL
  Filled 2012-09-08 (×2): qty 2

## 2012-09-08 NOTE — Progress Notes (Signed)
CRITICAL VALUE ALERT  Critical value received:  HG 6.7  Date of notification:  09/08/12  Time of notification:  0642  Critical value read back:yes  Nurse who received alert:  Norton Pastel, RN  MD notified (1st page):  Dr. Phillips Odor  Time of first page:  [phone]  MD notified (2nd page):  Time of second page:  Responding MD:    Time MD responded:

## 2012-09-08 NOTE — Progress Notes (Signed)
[facility] PROGRESS NOTE  [name] OZH:[id number] DOB: 20-Feb-1971 DOA: 09/06/2012 PCP: No primary provider on file.  Assessment/Plan: Anemia: 6.7 today  s/p 1 units PRBC's 2 days ago. Started menses. No other s/sx blding.  Appreciate GI consultation yielding no evidence GI bleed at this time. Will continue proton pump inhibitors. 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 Anemia panel consistent with chronic disease. Hepatitis panel pending. Will defer prbc transfusion to hematology.  Gm. Neg Bacteremia. Likely related to PICC line. PICC removed 09/07/12. Culture of tip pending.  Afebrile, non-toxic appearing. WC WNL today.  Zosyn day #2. Follow up blood culture sensitivies  Acute renal failure: likely related to #2. Will hydrate gently with IV fluids, hold nephrotoxins. Monitor. Hx kidney stones. Urine output good.     Hypokalemia. Replaced and resolved.  Will check mag level. Will monitor.   Hypertension. Stable   GERD. Continue proton pump inhibitors. See #1 and GI consult note   Low back pain. Chronic issue. Patient was informed that she'll not receive any opiates.   Recent psoas abscess. Repeat CT scan yields no abscess. Of note, completed more than 4 weeks of IV vancomycin.   Code Status: full Family Communication:  Disposition Plan: home when ready   Consultants:  Hematology  GI   Procedures:  none  Antibiotics: Zosyn 09/07/12>>>  HPI/Subjective: Lying in bed eyes closed. Arouses to verbal stimuli  Objective: Filed Vitals:   09/07/12 1450 09/07/12 2216 09/08/12 0427 09/08/12 1000  BP: 106/58 112/68 105/61 108/60  Pulse: 90 92 78 84  Temp: 98.9 F (37.2 C) 97.9 F (36.6 C) 98.2 F (36.8 C)   TempSrc: Oral Oral Oral   Resp: 20 20 20    Height:      Weight:      SpO2: 100%  99%     Intake/Output Summary (Last 24 hours) at 09/08/12 1332 Last data filed at 09/08/12 0941  Gross per 24 hour  Intake    1320 ml  Output   2300 ml  Net   -980 ml   Filed Weights   09/06/12 1231 09/06/12 1732  Weight: 72.576 kg (160 lb) 72.576 kg (160 lb)    Exam:   General:  Awake alert NAD  Cardiovascular: RRR No LEE No MGR  Respiratory: normal effort. BSCTAB no wheeze/rhonchi  Abdomen: soft +BS non-tender to palpaton   Data Reviewed: Basic Metabolic Panel:  Recent Labs Lab 09/02/12 1037 09/06/12 1345 09/07/12 0504 09/07/12 1947  NA 135 137 135 137  K 3.4* 3.4* 3.9 3.0*  CL 102 101 104 104  CO2 23 26 21 22   GLUCOSE 107* 95 124* 122*  BUN 19 18 20 20   CREATININE 1.56* 1.36* 1.72* 1.69*  CALCIUM 8.1* 9.1 8.2* 8.3*   Liver Function Tests:  Recent Labs Lab 09/06/12 1324 09/07/12 1947  AST 20 21  ALT 11 14  ALKPHOS 46 53  BILITOT 0.4 0.3  PROT 7.5 6.9  ALBUMIN 3.3* 2.8*   No results found for this basename: LIPASE, AMYLASE,  in the last 168 hours No results found for this basename: AMMONIA,  in the last 168 hours CBC:  Recent Labs Lab 09/02/12 1037 09/06/12 1314 09/07/12 0504 09/08/12 0457  WBC 12.4* 8.6 21.0* 8.7  NEUTROABS 9.3* 4.2  --  4.4  HGB 6.1* 6.0* 7.1* 6.7*  HCT 18.3* 18.7* 21.7* 20.6*  MCV 88.4 91.2 91.2 91.6  PLT 114* 149* 127* 146*   Cardiac Enzymes: No results  found for this basename: CKTOTAL, CKMB, CKMBINDEX, TROPONINI,  in the last 168 hours BNP (last 3 results) No results found for this basename: PROBNP,  in the last 8760 hours CBG: No results found for this basename: GLUCAP,  in the last 168 hours  Recent Results (from the past 240 hour(s))  CULTURE, BLOOD (ROUTINE X 2)     Status: None   Collection Time    09/06/12 10:53 PM      Result Value Range Status   Specimen Description BLOOD LEFT ANTECUBITAL   Final   Special Requests BOTTLES DRAWN AEROBIC AND ANAEROBIC 8CC   Final   Culture  Setup Time 09/07/2012 12:20   Final   Culture     Final   Value: GRAM NEGATIVE RODS     Note: Performed at [facility] Gram Stain Report Called  to,Read Back By and Verified With: MORRIS C. @12 :47 ON 09/07/12 BY BAUGHAM   Report Status PENDING   Incomplete  CULTURE, BLOOD (ROUTINE X 2)     Status: None   Collection Time    09/06/12 11:00 PM      Result Value Range Status   Specimen Description BLOOD LEFT HAND   Final   Special Requests BOTTLES DRAWN AEROBIC AND ANAEROBIC [facility]   Final   Culture  Setup Time 09/07/2012 12:30   Final   Culture     Final   Value: GRAM NEGATIVE RODS     Note: Performed at [facility] Gram Stain Report Called to,Read Back By and Verified With: MORRIS C. @12 :47 ON 09/07/12 BY Luetta Nutting  M   Report Status PENDING   Incomplete  MRSA PCR SCREENING     Status: None   Collection Time    09/07/12  9:27 AM      Result Value Range Status   MRSA by PCR NEGATIVE  NEGATIVE Final   Comment:            The GeneXpert MRSA Assay (FDA     approved for NASAL specimens     only), is one component of a     comprehensive MRSA colonization     surveillance program. It is not     intended to diagnose MRSA     infection nor to guide or     monitor treatment for     MRSA infections.  CATH TIP CULTURE     Status: None   Collection Time    09/07/12  6:30 PM      Result Value Range Status   Specimen Description CATH TIP PICC LINE   Final   Special Requests NONE   Final   Culture NO GROWTH   Final   Report Status PENDING   Incomplete     Studies: Dg Chest 1 View  09/07/2012  *RADIOLOGY REPORT*  Clinical Data: Fever.  CHEST - 1 VIEW  Comparison: 08/19/2012  Findings: The right-sided PICC line is poorly visualized centrally. It may terminate more inferiorly, over the mid right atrium.  Normal heart size.  No pleural effusion or pneumothorax.  Clear lungs.  IMPRESSION: No acute cardiopulmonary disease.  Right-sided PICC line poorly visualized centrally.  May terminate over the mid right atrium.  Consider PA and lateral plain film correlation.   Original Report Authenticated By: Jeronimo Greaves, M.D.    Ct Abdomen Pelvis W  Contrast  09/07/2012  *RADIOLOGY REPORT*  Clinical Data: Abdominal pain. Evaluate abscess.  CT ABDOMEN AND PELVIS WITH CONTRAST  Technique:  Multidetector  CT imaging of the abdomen and pelvis was performed following the standard protocol during bolus administration of intravenous contrast.  Contrast: 1 OMNIPAQUE IOHEXOL 300 MG/ML  SOLN, 80mL OMNIPAQUE IOHEXOL 300 MG/ML  SOLN  Comparison: 08/24/2012.  Findings: The sella as abscess drainage catheter has been removed. There is minimal residual thickening of the left psoas muscle but no discrete fluid collection to suggest a residual or recurrent abscess.  Both kidneys demonstrate normal perfusion.  No findings for pyelonephritis or renal abscess.  No hydronephrosis or obstructing ureteral calculi.  The liver is stable.  Stable hemangioma in the right hepatic lobe near the IVC.  The spleen is normal and stable.  The pancreas and adrenal glands are unremarkable and unchanged.  Stable borderline retroperitoneal lymph nodes, likely inflammatory.  The pelvis is unremarkable and stable.  The stomach, duodenum, small bowel and colon unremarkable and unchanged.  IMPRESSION: Resolution of the left psoas abscess without recurrent fluid collection/abscess.  Mild stable thickening of the muscle.   Original Report Authenticated By: Rudie Meyer, M.D.     Scheduled Meds: . cloNIDine  0.1 mg Oral TID  . pantoprazole  40 mg Oral BID AC  . piperacillin-tazobactam (ZOSYN)  IV  3.375 g Intravenous Q8H  . sodium chloride  3 mL Intravenous Q12H   Continuous Infusions:   Principal Problem:   Anemia Active Problems:   HYPERTENSION   GERD   LOW BACK PAIN   Tobacco abuse   Hypokalemia   Blood in stool   Gram-negative bacteremia   Acute renal failure    Time spent: 30 minutes    [facility] M  [facility]  If 8PM-8AM, please contact night-coverage at [email or web address], password [facility] 09/08/2012, 1:32 PM  LOS: 2 days   Attending note:  Patient seen and  examined.  Agree with note as above.
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
## 2012-09-08 NOTE — Progress Notes (Addendum)
Subjective: Only complains of lower back discomfort "where my drain was pulled". Mild nausea. No abdominal pain. No evidence of melena or hematochezia.   Objective: Vital signs in last 24 hours: Temp:  [97.9 F (36.6 C)-98.9 F (37.2 C)] 98.2 F (36.8 C) (02/18 0427) Pulse Rate:  [78-92] 78 (02/18 0427) Resp:  [20] 20 (02/18 0427) BP: (93-112)/(56-68) 105/61 mmHg (02/18 0427) SpO2:  [99 %-100 %] 99 % (02/18 0427) Last BM Date: 09/06/12 General:   Alert and oriented, pleasant Head:  Normocephalic and atraumatic. Eyes:  No icterus, sclera clear. Conjuctiva pink.  Heart:  S1, S2 present, no murmurs noted.  Lungs: Clear to auscultation bilaterally, without wheezing, rales, or rhonchi.  Abdomen:  Bowel sounds present, soft, non-tender, non-distended. No HSM or hernias noted. No rebound or guarding. No masses appreciated  Msk:  Symmetrical without gross deformities. Normal posture. Neurologic:  Alert and  oriented x4;  grossly normal neurologically. Psych:  Alert and cooperative. Normal mood and affect.  Intake/Output from previous day: 02/17 0701 - 02/18 0700 In: 1320 [P.O.:1320] Out: 2300 [Urine:2300] Intake/Output this shift:    Lab Results:  Recent Labs  09/06/12 1314 09/07/12 0504 09/08/12 0457  WBC 8.6 21.0* 8.7  HGB 6.0* 7.1* 6.7*  HCT 18.7* 21.7* 20.6*  PLT 149* 127* 146*   BMET  Recent Labs  09/06/12 1345 09/07/12 0504 09/07/12 1947  NA 137 135 137  K 3.4* 3.9 3.0*  CL 101 104 104  CO2 26 21 22   GLUCOSE 95 124* 122*  BUN 18 20 20   CREATININE 1.36* 1.72* 1.69*  CALCIUM 9.1 8.2* 8.3*   LFT  Recent Labs  09/06/12 1324 09/07/12 1947  PROT 7.5 6.9  ALBUMIN 3.3* 2.8*  AST 20 21  ALT 11 14  ALKPHOS 46 53  BILITOT 0.4 0.3  BILIDIR 0.1  --   IBILI 0.3  --    PT/INR  Recent Labs  09/07/12 1947  LABPROT 13.3  INR 1.02     Studies/Results: Dg Chest 1 View  09/07/2012  *RADIOLOGY REPORT*  Clinical Data: Fever.  CHEST - 1 VIEW  Comparison:  08/19/2012  Findings: The right-sided PICC line is poorly visualized centrally. It may terminate more inferiorly, over the mid right atrium.  Normal heart size.  No pleural effusion or pneumothorax.  Clear lungs.  IMPRESSION: No acute cardiopulmonary disease.  Right-sided PICC line poorly visualized centrally.  May terminate over the mid right atrium.  Consider PA and lateral plain film correlation.   Original Report Authenticated By: Jeronimo Greaves, M.D.    Ct Abdomen Pelvis W Contrast  09/07/2012  *RADIOLOGY REPORT*  Clinical Data: Abdominal pain. Evaluate abscess.  CT ABDOMEN AND PELVIS WITH CONTRAST  Technique:  Multidetector CT imaging of the abdomen and pelvis was performed following the standard protocol during bolus administration of intravenous contrast.  Contrast: 1 OMNIPAQUE IOHEXOL 300 MG/ML  SOLN, 80mL OMNIPAQUE IOHEXOL 300 MG/ML  SOLN  Comparison: 08/24/2012.  Findings: The sella as abscess drainage catheter has been removed. There is minimal residual thickening of the left psoas muscle but no discrete fluid collection to suggest a residual or recurrent abscess.  Both kidneys demonstrate normal perfusion.  No findings for pyelonephritis or renal abscess.  No hydronephrosis or obstructing ureteral calculi.  The liver is stable.  Stable hemangioma in the right hepatic lobe near the IVC.  The spleen is normal and stable.  The pancreas and adrenal glands are unremarkable and unchanged.  Stable borderline retroperitoneal lymph nodes, likely inflammatory.  The pelvis is unremarkable and stable.  The stomach, duodenum, small bowel and colon unremarkable and unchanged.  IMPRESSION: Resolution of the left psoas abscess without recurrent fluid collection/abscess.  Mild stable thickening of the muscle.   Original Report Authenticated By: Rudie Meyer, M.D.     Assessment: 42 year old female with history of erosive esophagitis, gastritis, gastroparesis, who presented with Hgb 6.6,heme negative since admission.  No evidence of overt GI bleeding. Anemia panel: ferritin 315 (elevated), iron, TIBC normal. May consider colonoscopy+/- EGD in future if worsening anemia or heme positive. After review of notes, appears hx of Hep C. Hepatitis panel ordered per oncology.   Plan: Continue to monitor for evidence of GI bleeding BID PPI Follow-up Hepatitis panel (ordered by oncology); if +Hep C, may proceed with RNA   LOS: 2 days   Gerrit Halls  09/08/2012, 7:57 AM    Agree with above assessment and plan

## 2012-09-09 LAB — MAGNESIUM: Magnesium: 1.7 mg/dL (ref 1.5–2.5)

## 2012-09-09 LAB — CBC
MCH: 29.4 pg (ref 26.0–34.0)
MCHC: 31.3 g/dL (ref 30.0–36.0)
Platelets: 178 10*3/uL (ref 150–400)
RDW: 21.1 % — ABNORMAL HIGH (ref 11.5–15.5)

## 2012-09-09 LAB — BASIC METABOLIC PANEL
BUN: 14 mg/dL (ref 6–23)
Calcium: 9 mg/dL (ref 8.4–10.5)
Chloride: 111 mEq/L (ref 96–112)
GFR calc Af Amer: 54 mL/min — ABNORMAL LOW (ref >90)
GFR calc non Af Amer: 46 mL/min — ABNORMAL LOW (ref >90)
Glucose, Bld: 108 mg/dL — ABNORMAL HIGH (ref 70–99)
Potassium: 4.2 mEq/L (ref 3.5–5.1)
Sodium: 142 mEq/L (ref 135–145)

## 2012-09-09 LAB — CULTURE, BLOOD (ROUTINE X 2)

## 2012-09-09 MED ORDER — PROMETHAZINE HCL 25 MG/ML IJ SOLN
12.5000 mg | Freq: Three times a day (TID) | INTRAMUSCULAR | Status: DC | PRN
  Administered 2012-09-09: 15:00:00 via INTRAVENOUS
  Filled 2012-09-09: qty 1

## 2012-09-09 MED ORDER — SODIUM CHLORIDE 0.9 % IJ SOLN
INTRAMUSCULAR | Status: AC
  Administered 2012-09-09: 13:00:00
  Filled 2012-09-09: qty 3

## 2012-09-09 MED ORDER — LORAZEPAM 2 MG/ML IJ SOLN
0.5000 mg | Freq: Four times a day (QID) | INTRAMUSCULAR | Status: DC | PRN
  Administered 2012-09-09: 0.5 mg via INTRAVENOUS
  Filled 2012-09-09: qty 1

## 2012-09-09 MED ORDER — SODIUM CHLORIDE 0.9 % IV SOLN: 250.0000 mL | INTRAVENOUS | Status: DC | PRN

## 2012-09-09 MED ORDER — LORAZEPAM 2 MG/ML IJ SOLN
0.5000 mg | Freq: Three times a day (TID) | INTRAMUSCULAR | Status: DC | PRN
  Administered 2012-09-09 – 2012-09-10 (×2): 0.5 mg via INTRAVENOUS
  Filled 2012-09-09 (×2): qty 1

## 2012-09-09 MED ORDER — SULFAMETHOXAZOLE-TMP DS 800-160 MG PO TABS
1.0000 | ORAL_TABLET | Freq: Two times a day (BID) | ORAL | Status: DC
  Administered 2012-09-09 – 2012-09-10 (×3): 1 via ORAL
  Filled 2012-09-09 (×3): qty 1

## 2012-09-09 NOTE — Progress Notes (Signed)
Subjective: Patient reports that she is sleepy and wishes to go back to sleep.  She denies any fevers, chills, nausea, and vomiting.  I personally reviewed and went over laboratory results with the patient.  Objective: Vital signs in last 24 hours: Temp:  [97.9 F (36.6 C)-98.1 F (36.7 C)] 97.9 F (36.6 C) (02/19 0529) Pulse Rate:  [73-84] 73 (02/19 0529) Resp:  [20] 20 (02/19 0529) BP: (93-109)/(60-74) 101/67 mmHg (02/19 0529) SpO2:  [97 %-98 %] 98 % (02/19 0529)  Intake/Output from previous day: 02/18 0800 - 02/19 0759 In: 720 [P.O.:720] Out: -  Intake/Output this shift:    General appearance: alert, cooperative, no distress and chronically ill appearing Resp: clear to auscultation bilaterally Cardio: regular rate and rhythm, S1, S2 normal, no murmur, click, rub or gallop GI: abnormal findings:  mild tenderness in the LLQ Extremities: extremities normal, atraumatic, no cyanosis or edema  Lab Results:   Recent Labs  09/08/12 0457 09/09/12 0456  WBC 8.7 6.2  HGB 6.7* 7.0*  HCT 20.6* 22.4*  PLT 146* 178   BMET  Recent Labs  09/07/12 1947 09/09/12 0456  NA 137 142  K 3.0* 4.2  CL 104 111  CO2 22 21  GLUCOSE 122* 108*  BUN 20 14  CREATININE 1.69* 1.39*  CALCIUM 8.3* 9.0    Studies/Results: Ct Abdomen Pelvis W Contrast  09/07/2012  *RADIOLOGY REPORT*  Clinical Data: Abdominal pain. Evaluate abscess.  CT ABDOMEN AND PELVIS WITH CONTRAST  Technique:  Multidetector CT imaging of the abdomen and pelvis was performed following the standard protocol during bolus administration of intravenous contrast.  Contrast: 1 OMNIPAQUE IOHEXOL 300 MG/ML  SOLN, 80mL OMNIPAQUE IOHEXOL 300 MG/ML  SOLN  Comparison: 08/24/2012.  Findings: The sella as abscess drainage catheter has been removed. There is minimal residual thickening of the left psoas muscle but no discrete fluid collection to suggest a residual or recurrent abscess.  Both kidneys demonstrate normal perfusion.  No  findings for pyelonephritis or renal abscess.  No hydronephrosis or obstructing ureteral calculi.  The liver is stable.  Stable hemangioma in the right hepatic lobe near the IVC.  The spleen is normal and stable.  The pancreas and adrenal glands are unremarkable and unchanged.  Stable borderline retroperitoneal lymph nodes, likely inflammatory.  The pelvis is unremarkable and stable.  The stomach, duodenum, small bowel and colon unremarkable and unchanged.  IMPRESSION: Resolution of the left psoas abscess without recurrent fluid collection/abscess.  Mild stable thickening of the muscle.   Original Report Authenticated By: Rudie Meyer, M.D.     Medications: I have reviewed the patient's current medications.  Assessment/Plan: 1. Severe anemia, Coombs negative hemolytic anemia, secondary to #2. S/P 1 unit PRBC on 09/07/12. 2nd unit held due to fevers and rigors. Elevated LDH, Negative DAT. Anemia panel is indicative of anemia of chronic disease.  Reticulocyte count is indicative of compensatory mechanism.  History of hepatitis C, Hepatitis panel ordered on 2/18. Haptoglobin is WNL. 2. Gram Negative Bacteremia, on Zosyn IV.  Contributing to #1. 3. Rectal bleeding versus menses  4. Mild thrombocytopenia, reactive to #2, stable. 5. Elevated LDH  6. Decreased Haptoglobin  7. Elevated RDW  8. Left psoas MRSA abscess requiring percutaneous drainage and 4 weeks of IV Vancomycin requiring PICC line.  9. Renal insufficiency  10. IV drug abuse, Opana and Crack/Cocaine, assisted with PICC line for #9 11. Psoriasis, since the age of 35, treated with OTC Salve    Patient and plan discussed with Dr.  Glenford Peers and he is in agreement with the aforementioned.   LOS: 3 days    Monty Mccarrell 09/09/2012

## 2012-09-09 NOTE — Progress Notes (Signed)
09/09/12 1208 Patient ambulated in hallway with her mother this morning. Tolerated well, denies dizziness or weakness during ambulation. Discussed that patient may ambulate on floor but not recommended that she leave the floor for safety. Earnstine Regal, RN

## 2012-09-09 NOTE — Clinical Social Work Psychosocial (Signed)
    Clinical Social Work Department BRIEF PSYCHOSOCIAL ASSESSMENT 09/09/2012  Patient:  [name], [name]     Account Number:  [id number]     Admit date:  09/06/2012  Clinical Social Worker:  Santa Genera, CLINICAL SOCIAL WORKER  Date/Time:  09/09/2012 04:40 PM  Referred by:  Physician  Date Referred:  09/09/2012 Referred for  Substance Abuse   Other Referral:   Interview type:  Patient Other interview type:    PSYCHOSOCIAL DATA Living Status:  FAMILY Admitted from facility:   Level of care:   Primary support name:  [place] Plan Primary support relationship to patient:  PARENT Degree of support available:   Adequate    CURRENT CONCERNS Current Concerns  Substance Abuse   Other Concerns:    SOCIAL WORK ASSESSMENT / PLAN CSW met w patient, patient admits to current daily use of Opana which she obtains from a friend.  Patient says she is "sick of it" and knows "it is killing me."  Patient wants to stop opiate use, says she had abused crack cocaine, decided to stop and was abstinent approx 3 years.  About 9 months ago, she began opiate abuse.  Patient denies any specific trigger for resuming drug use.  Patient currently lives at home w her mother and two daughters (11 and 20).    Patient sees outpatient therapist 2x/month at [facility] in [place].  Feels outpatient therapy is helpful and plans to contact therapist to see if frequencycan be increased.    Patient encouraged to develop more support in effort to quit opiate abuse.  Patient states she "just needs to stop" and voices conviction that she can stop if she puts her mind to it.  Points to successful abstinence from cocaine for 3 years.  CSW affirmed patient success in the past, and suggested that increased support would be beneficial at present.  Patient says she will contact NA in [facility] and ask for a sponsor as well as increasing frequency of outpatient therapy visits.    Patient was offered  information on community resources as well as possible referral for SA treatment facilities, patient declined these, stating she preferred to "stay at home" and felt she could quit opiates w assistance of current resources (outpatient therapist and possible NA contact).  Says she knows how to reach NA in [place].    CSW will stand by to offer assistance if requested by patient.   Assessment/plan status:  Psychosocial Support/Ongoing Assessment of Needs Other assessment/ plan:   Information/referral to community resources:    PATIENT'S/FAMILY'S RESPONSE TO PLAN OF CARE: Patient voices understanding of negative effect of her drug use and states that she wants to quit; however, she prefers to do this without much outside assistance.    Santa Genera, LCSW Clinical Social Worker [phone])

## 2012-09-09 NOTE — Progress Notes (Signed)
[facility] PROGRESS NOTE  [name] WUJ:[id number] DOB: 1971/04/29 DOA: 09/06/2012 PCP: No primary provider on file.  Assessment/Plan: Anemia: 7.0 today s/p 1 units PRBC's 3 days ago. Started menses. No other s/sx blding. Appreciate GI consultation yielding no evidence GI bleed at this time. Will continue proton pump inhibitors. 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 Anemia panel consistent with chronic disease. Hepatitis panel pending. Will defer prbc transfusion to hematology.   Gm. Neg Bacteremia. Likely related to PICC line. PICC removed 09/07/12. Culture of tip no growth to date. Remains afebrile, non-toxic appearing on peripheral IV antibiotics. WC WNL today. Zosyn day #3.  Discussed with dr. Orvan Falconer from ID who recommended repeating blood cultures to document clearing of bacteremia and transitioning to Septra DS BID for total 14 days.   Acute renal failure: likely related to #2.  Creatinine trending downward to baseline. Continue to  hydrate gently with IV fluids, hold nephrotoxins. Monitor. Hx kidney stones. Urine output good.   Hypokalemia. Replaced and resolved. Will check mag level. Will monitor.  Hypertension. Stable  GERD. Continue proton pump inhibitors. See #1 and GI consult note  Low back pain. Chronic issue. Patient was informed that she'll not receive any opiates.  Recent psoas abscess. Repeat CT scan yields no abscess. Of note, completed more than 4 weeks of IV vancomycin.     Code Status: full Family Communication:  Disposition Plan: home when ready hopefully 24-36 hours.   Consultants:  GI  Heme  Procedures:  none  Antibiotics: Zosyn 09/07/12>>> HPI/Subjective: Eating breakfast somewhat lethargic.   Objective: Filed Vitals:   09/08/12 1000 09/08/12 1418 09/08/12 2102 09/09/12 0529  BP: 108/60 109/74 93/61 101/67  Pulse: 84 76 74 73  Temp:  98.1 F (36.7 C) 98 F (36.7 C) 97.9 F (36.6 C)   TempSrc:  Oral Oral   Resp:  20  20  Height:      Weight:      SpO2:  97% 97% 98%    Intake/Output Summary (Last 24 hours) at 09/09/12 0955 Last data filed at 09/08/12 1818  Gross per 24 hour  Intake    480 ml  Output      0 ml  Net    480 ml   Filed Weights   09/06/12 1231 09/06/12 1732  Weight: 72.576 kg (160 lb) 72.576 kg (160 lb)    Exam:   General:  Awake, lethargic NAD  Cardiovascular: RRR No MGR No LEE  Respiratory: normal effort CTA B No wheeze  Abdomen: soft +BS non-tender to palpation  Data Reviewed: Basic Metabolic Panel:  Recent Labs Lab 09/02/12 1037 09/06/12 1345 09/07/12 0504 09/07/12 1947 09/09/12 0456  NA 135 137 135 137 142  K 3.4* 3.4* 3.9 3.0* 4.2  CL 102 101 104 104 111  CO2 23 26 21 22 21   GLUCOSE 107* 95 124* 122* 108*  BUN 19 18 20 20 14   CREATININE 1.56* 1.36* 1.72* 1.69* 1.39*  CALCIUM 8.1* 9.1 8.2* 8.3* 9.0  MG  --   --   --   --  1.7   Liver Function Tests:  Recent Labs Lab 09/06/12 1324 09/07/12 1947  AST 20 21  ALT 11 14  ALKPHOS 46 53  BILITOT 0.4 0.3  PROT 7.5 6.9  ALBUMIN 3.3* 2.8*   No results found for this basename: LIPASE, AMYLASE,  in the last 168 hours No results found for this basename: AMMONIA,  in the last 168  hours CBC:  Recent Labs Lab 09/02/12 1037 09/06/12 1314 09/07/12 0504 09/08/12 0457 09/09/12 0456  WBC 12.4* 8.6 21.0* 8.7 6.2  NEUTROABS 9.3* 4.2  --  4.4  --   HGB 6.1* 6.0* 7.1* 6.7* 7.0*  HCT 18.3* 18.7* 21.7* 20.6* 22.4*  MCV 88.4 91.2 91.2 91.6 94.1  PLT 114* 149* 127* 146* 178   Cardiac Enzymes: No results found for this basename: CKTOTAL, CKMB, CKMBINDEX, TROPONINI,  in the last 168 hours BNP (last 3 results) No results found for this basename: PROBNP,  in the last 8760 hours CBG: No results found for this basename: GLUCAP,  in the last 168 hours  Recent Results (from the past 240 hour(s))  CULTURE, BLOOD (ROUTINE X 2)     Status: None   Collection Time    09/06/12  10:53 PM      Result Value Range Status   Specimen Description BLOOD LEFT ANTECUBITAL   Final   Special Requests BOTTLES DRAWN AEROBIC AND ANAEROBIC 8CC   Final   Culture  Setup Time 09/07/2012 12:20   Final   Culture     Final   Value: KLEBSIELLA PNEUMONIAE     Note: Performed at [facility] Gram Stain Report Called to,Read Back By and Verified With: MORRIS C. @12 :47 ON 09/07/12 BY BAUGHAM   Report Status 09/09/2012 FINAL   Final   Organism ID, Bacteria KLEBSIELLA PNEUMONIAE   Final  CULTURE, BLOOD (ROUTINE X 2)     Status: None   Collection Time    09/06/12 11:00 PM      Result Value Range Status   Specimen Description BLOOD LEFT HAND   Final   Special Requests BOTTLES DRAWN AEROBIC AND ANAEROBIC [facility]   Final   Culture  Setup Time 09/07/2012 12:30   Final   Culture     Final   Value: KLEBSIELLA PNEUMONIAE     Note: SUSCEPTIBILITIES PERFORMED ON PREVIOUS CULTURE WITHIN THE LAST 5 DAYS.     Note: Performed at [facility] Gram Stain Report Called to,Read Back By and Verified With: MORRIS C. @12 :47 ON 09/07/12 BY BAUGHAM  M   Report Status 09/09/2012 FINAL   Final  MRSA PCR SCREENING     Status: None   Collection Time    09/07/12  9:27 AM      Result Value Range Status   MRSA by PCR NEGATIVE  NEGATIVE Final   Comment:            The GeneXpert MRSA Assay (FDA     approved for NASAL specimens     only), is one component of a     comprehensive MRSA colonization     surveillance program. It is not     intended to diagnose MRSA     infection nor to guide or     monitor treatment for     MRSA infections.  CATH TIP CULTURE     Status: None   Collection Time    09/07/12  6:30 PM      Result Value Range Status   Specimen Description CATH TIP PICC LINE   Final   Special Requests NONE   Final   Culture NO GROWTH 1 DAY   Final   Report Status PENDING   Incomplete  URINE CULTURE     Status: None   Collection Time    09/08/12 12:59 AM      Result Value Range Status    Specimen Description URINE,  CLEAN CATCH   Final   Special Requests NONE   Final   Culture  Setup Time 09/08/2012 01:40   Final   Colony Count PENDING   Incomplete   Culture Culture reincubated for better growth   Final   Report Status PENDING   Incomplete     Studies: Ct Abdomen Pelvis W Contrast  09/07/2012  *RADIOLOGY REPORT*  Clinical Data: Abdominal pain. Evaluate abscess.  CT ABDOMEN AND PELVIS WITH CONTRAST  Technique:  Multidetector CT imaging of the abdomen and pelvis was performed following the standard protocol during bolus administration of intravenous contrast.  Contrast: 1 OMNIPAQUE IOHEXOL 300 MG/ML  SOLN, 80mL OMNIPAQUE IOHEXOL 300 MG/ML  SOLN  Comparison: 08/24/2012.  Findings: The sella as abscess drainage catheter has been removed. There is minimal residual thickening of the left psoas muscle but no discrete fluid collection to suggest a residual or recurrent abscess.  Both kidneys demonstrate normal perfusion.  No findings for pyelonephritis or renal abscess.  No hydronephrosis or obstructing ureteral calculi.  The liver is stable.  Stable hemangioma in the right hepatic lobe near the IVC.  The spleen is normal and stable.  The pancreas and adrenal glands are unremarkable and unchanged.  Stable borderline retroperitoneal lymph nodes, likely inflammatory.  The pelvis is unremarkable and stable.  The stomach, duodenum, small bowel and colon unremarkable and unchanged.  IMPRESSION: Resolution of the left psoas abscess without recurrent fluid collection/abscess.  Mild stable thickening of the muscle.   Original Report Authenticated By: Rudie Meyer, M.D.     Scheduled Meds: . cloNIDine  0.1 mg Oral TID  . pantoprazole  40 mg Oral BID AC  . piperacillin-tazobactam (ZOSYN)  IV  3.375 g Intravenous Q8H  . sodium chloride  3 mL Intravenous Q12H   Continuous Infusions:   Principal Problem:   Anemia Active Problems:   HYPERTENSION   GERD   LOW BACK PAIN   Tobacco abuse    Hypokalemia   Blood in stool   Gram-negative bacteremia   Acute renal failure    Time spent: 30 mimites    [facility]  [facility]  If 8PM-8AM, please contact night-coverage at [email or web address], password [facility] 09/09/2012, 9:55 AM  LOS: 3 days    Attending note:  Patient seen and examined.  Agree with note as above.  Will decrease her sedative medications.  Plan for discharge home tomorrow.

## 2012-09-09 NOTE — Progress Notes (Addendum)
ANTIBIOTIC CONSULT NOTE   Pharmacy Consult for Zosyn Indication: + blood cultures 2/2  Allergies  Allergen Reactions  . Tramadol Hcl Anaphylaxis and Swelling    Of tongue  . Ketorolac Tromethamine Nausea And Vomiting  . Propoxyphene Napsylate Rash   Patient Measurements: Height: 5\' 2"  (157.5 cm) Weight: 160 lb (72.576 kg) IBW/kg (Calculated) : 50.1  Vital Signs: Temp: 97.9 F (36.6 C) (02/19 0529) BP: 101/67 mmHg (02/19 0529) Pulse Rate: 73 (02/19 0529) Intake/Output from previous day: 02/18 0701 - 02/19 0700 In: 720 [P.O.:720] Out: -  Intake/Output from this shift: Total I/O In: 480 [P.O.:480] Out: -   Labs:  Recent Labs  09/07/12 0504 09/07/12 1947 09/08/12 0457 09/09/12 0456  WBC 21.0*  --  8.7 6.2  HGB 7.1*  --  6.7* 7.0*  PLT 127*  --  146* 178  CREATININE 1.72* 1.69*  --  1.39*   Estimated Creatinine Clearance: 49.7 ml/min (by C-G formula based on Cr of 1.39). No results found for this basename: VANCOTROUGH, VANCOPEAK, VANCORANDOM, GENTTROUGH, GENTPEAK, GENTRANDOM, TOBRATROUGH, TOBRAPEAK, TOBRARND, AMIKACINPEAK, AMIKACINTROU, AMIKACIN,  in the last 72 hours   Microbiology: Recent Results (from the past 720 hour(s))  CULTURE, BLOOD (ROUTINE X 2)     Status: None   Collection Time    09/06/12 10:53 PM      Result Value Range Status   Specimen Description BLOOD LEFT ANTECUBITAL   Final   Special Requests BOTTLES DRAWN AEROBIC AND ANAEROBIC 8CC   Final   Culture  Setup Time 09/07/2012 12:20   Final   Culture     Final   Value: KLEBSIELLA PNEUMONIAE     Note: Performed at [facility] Gram Stain Report Called to,Read Back By and Verified With: MORRIS C. @12 :47 ON 09/07/12 BY BAUGHAM   Report Status 09/09/2012 FINAL   Final   Organism ID, Bacteria KLEBSIELLA PNEUMONIAE   Final  CULTURE, BLOOD (ROUTINE X 2)     Status: None   Collection Time    09/06/12 11:00 PM      Result Value Range Status   Specimen Description BLOOD LEFT HAND   Final    Special Requests BOTTLES DRAWN AEROBIC AND ANAEROBIC [facility]   Final   Culture  Setup Time 09/07/2012 12:30   Final   Culture     Final   Value: KLEBSIELLA PNEUMONIAE     Note: SUSCEPTIBILITIES PERFORMED ON PREVIOUS CULTURE WITHIN THE LAST 5 DAYS.     Note: Performed at [facility] Gram Stain Report Called to,Read Back By and Verified With: MORRIS C. @12 :47 ON 09/07/12 BY BAUGHAM  M   Report Status 09/09/2012 FINAL   Final  MRSA PCR SCREENING     Status: None   Collection Time    09/07/12  9:27 AM      Result Value Range Status   MRSA by PCR NEGATIVE  NEGATIVE Final   Comment:            The GeneXpert MRSA Assay (FDA     approved for NASAL specimens     only), is one component of a     comprehensive MRSA colonization     surveillance program. It is not     intended to diagnose MRSA     infection nor to guide or     monitor treatment for     MRSA infections.  CATH TIP CULTURE     Status: None   Collection Time    09/07/12  6:30 PM      Result Value Range Status   Specimen Description CATH TIP PICC LINE   Final   Special Requests NONE   Final   Culture NO GROWTH 1 DAY   Final   Report Status PENDING   Incomplete  URINE CULTURE     Status: None   Collection Time    09/08/12 12:59 AM      Result Value Range Status   Specimen Description URINE, CLEAN CATCH   Final   Special Requests NONE   Final   Culture  Setup Time 09/08/2012 01:40   Final   Colony Count PENDING   Incomplete   Culture Culture reincubated for better growth   Final   Report Status PENDING   Incomplete   Culture, blood (routine x 2)   Status: Final result Visible to patient: This result is not viewable by the patient. Next appt: None          Newer results are available. Click to view them now.        Value    Specimen Description BLOOD LEFT ANTECUBITAL     Special Requests BOTTLES DRAWN AEROBIC AND ANAEROBIC 8CC    Culture Setup Time 09/07/2012 12:20     Culture KLEBSIELLA PNEUMONIAE Note:  Performed at [facility] Gram Stain Report Called to,Read Back By and Verified With: MORRIS C. @12 :47 ON 09/07/12 BY BAUGHAM    Report Status 09/09/2012 FINAL     Organism ID, Bacteria KLEBSIELLA PNEUMONIAE     Resulting Agency [facility]   Culture & Susceptibility     Antibiotic  Organism Organism Organism      KLEBSIELLA PNEUMONIAE      AMPICILLIN  RESISTANT Final        AMPICILLIN/SULBACTAM  4 S Final        CEFAZOLIN  <=4 S Final        CEFEPIME  <=1 S Final        CEFOXITIN  32 R Final        CEFTAZIDIME  <=1 S Final        CEFTRIAXONE  <=1 S Final        CIPROFLOXACIN  <=0.25 S Final        GENTAMICIN  <=1 S Final        IMIPENEM  <=0.25 S Final        PIP/TAZO  <=4 S Final        TOBRAMYCIN  <=1 S Final        TRIMETH/SULFA  <=20 S Final       Medical History: Past Medical History  Diagnosis Date  . Hypertension   . Depressed   . Drug addiction Hep C  . Hepatitis C     pt denies any treatment  . Cancer   . Cervical cancer   . Renal disorder     kidney stones  . Gastroparesis   . Gastroesophageal reflux disease    Medications:  Scheduled:  . cloNIDine  0.1 mg Oral TID  . pantoprazole  40 mg Oral BID AC  . piperacillin-tazobactam (ZOSYN)  IV  3.375 g Intravenous Q8H  . [COMPLETED] potassium chloride  40 mEq Oral Q4H  . sodium chloride  3 mL Intravenous Q12H   Assessment: 42yo female with positive blood cx's with Klebsiella.  Pt had recent psoas abscess and completed course of IV Vancomycin.  Still has PICC in place from course of IV Vancomycin.  SCr has improved since  admision.  Estimated Creatinine Clearance: 49.7 ml/min (by C-G formula based on Cr of 1.39).  Pt is being followed by heme/onc for anemia.  Pt is afebrile.  Klebsiella is sensitive to Zosyn.  Goal of Therapy:  Eradicate infection.  Plan: Continue Zosyn 3.375gm IV q8hrs infuse over 4 hours Duration of therapy per MD (consider ID consult) Pt stable/improving, pharmacy will sign off  now Please re-consult if needed.  Margo Aye, Yazmyn Valbuena A 09/09/2012,10:43 AM

## 2012-09-09 NOTE — Progress Notes (Signed)
Subjective: Pt denies rectal bleeding or melena.  Denies abdominal pain.  C/o N/V x2 this AM.  Objective: Vital signs in last 24 hours: Temp:  [97.9 F (36.6 C)-98.1 F (36.7 C)] 97.9 F (36.6 C) (02/19 0529) Pulse Rate:  [73-84] 73 (02/19 0529) Resp:  [20] 20 (02/19 0529) BP: (93-109)/(60-74) 101/67 mmHg (02/19 0529) SpO2:  [97 %-98 %] 98 % (02/19 0529) Last BM Date: 09/06/12 Patient's last menstrual period was 09/06/2012. Body mass index is 29.26 kg/(m^2). General:   Alert, pleasant and cooperative in NAD Eyes:  Sclera clear, no icterus.   Conjunctiva pink. Mouth: oropharynx pink & moist. Heart:  Regular rate and rhythm Abdomen:   Normal bowel sounds.  Soft, nontender and nondistended.  No guarding or rebound tenderness.   Msk:  Symmetrical without gross deformities. Pulses:  Normal pulses noted. Extremities:  Without edema. Neurologic:  Alert and  oriented x4;  grossly normal neurologically. Psych:  Alert and cooperative. Normal mood and affect.  Intake/Output from previous day: 02/18 0701 - 02/19 0700 In: 720 [P.O.:720] Out: -   Lab Results:  Recent Labs  09/07/12 0504 09/08/12 0457 09/09/12 0456  WBC 21.0* 8.7 6.2  HGB 7.1* 6.7* 7.0*  HCT 21.7* 20.6* 22.4*  PLT 127* 146* 178   BMET  Recent Labs  09/07/12 0504 09/07/12 1947 09/09/12 0456  NA 135 137 142  K 3.9 3.0* 4.2  CL 104 104 111  CO2 21 22 21   GLUCOSE 124* 122* 108*  BUN 20 20 14   CREATININE 1.72* 1.69* 1.39*  CALCIUM 8.2* 8.3* 9.0   LFT  Recent Labs  09/06/12 1324 09/07/12 1947  PROT 7.5 6.9  ALBUMIN 3.3* 2.8*  AST 20 21  ALT 11 14  ALKPHOS 46 53  BILITOT 0.4 0.3  BILIDIR 0.1  --   IBILI 0.3  --    PT/INR  Recent Labs  09/07/12 1947  LABPROT 13.3  INR 1.02   Studies/Results: Ct Abdomen Pelvis W Contrast  09/07/2012  *RADIOLOGY REPORT*  Clinical Data: Abdominal pain. Evaluate abscess.  CT ABDOMEN AND PELVIS WITH CONTRAST  Technique:  Multidetector CT imaging of the abdomen  and pelvis was performed following the standard protocol during bolus administration of intravenous contrast.  Contrast: 1 OMNIPAQUE IOHEXOL 300 MG/ML  SOLN, 80mL OMNIPAQUE IOHEXOL 300 MG/ML  SOLN  Comparison: 08/24/2012.  Findings: The sella as abscess drainage catheter has been removed. There is minimal residual thickening of the left psoas muscle but no discrete fluid collection to suggest a residual or recurrent abscess.  Both kidneys demonstrate normal perfusion.  No findings for pyelonephritis or renal abscess.  No hydronephrosis or obstructing ureteral calculi.  The liver is stable.  Stable hemangioma in the right hepatic lobe near the IVC.  The spleen is normal and stable.  The pancreas and adrenal glands are unremarkable and unchanged.  Stable borderline retroperitoneal lymph nodes, likely inflammatory.  The pelvis is unremarkable and stable.  The stomach, duodenum, small bowel and colon unremarkable and unchanged.  IMPRESSION: Resolution of the left psoas abscess without recurrent fluid collection/abscess.  Mild stable thickening of the muscle.   Original Report Authenticated By: Rudie Meyer, M.D.     Assessment: 1. Anemia:  Hemolytic anemia secondary to bactremia & anemia of chronic disease.  Hgb 7 (stable) but low.  S/p transfusion reaction.  No gross or occult GI bleeding appreciated this hospitalization. 2. Hx HCV: Hepatitis panel pending 3. Hx gastroparesis, GERD:  1 episode of nausea this AM may be  due to gastroparesis +/- med effects.   Plan:  1. BID PPI  2. Follow-up Hepatitis panel 3. PRN zofran 4. Appreciate Hematology's input    LOS: 3 days   Lorenza Burton  09/09/2012, 8:09 AM

## 2012-09-09 NOTE — Progress Notes (Signed)
09/09/12 1805 Patient c/o feeling anxious and shaky, visible tremors. Asked that MD be notified to see if possible to have ativan changed to receive dose now. Notified Dr Kerry Hough. Stated to reassess patient and notify if any tremors on reassessment. Pt asleep on reassessment this evening, no tremors noted. Notified Dr Kerry Hough. Nursing to monitor. Earnstine Regal, RN

## 2012-09-10 ENCOUNTER — Encounter (HOSPITAL_COMMUNITY): Payer: Self-pay | Admitting: Internal Medicine

## 2012-09-10 ENCOUNTER — Other Ambulatory Visit (HOSPITAL_COMMUNITY): Payer: Self-pay | Admitting: Oncology

## 2012-09-10 DIAGNOSIS — F1120 Opioid dependence, uncomplicated: Secondary | ICD-10-CM | POA: Diagnosis present

## 2012-09-10 DIAGNOSIS — R768 Other specified abnormal immunological findings in serum: Secondary | ICD-10-CM

## 2012-09-10 DIAGNOSIS — N390 Urinary tract infection, site not specified: Secondary | ICD-10-CM

## 2012-09-10 LAB — CBC
MCH: 29.7 pg (ref 26.0–34.0)
Platelets: 225 10*3/uL (ref 150–400)
RBC: 2.59 MIL/uL — ABNORMAL LOW (ref 3.87–5.11)
RDW: 20 % — ABNORMAL HIGH (ref 11.5–15.5)
WBC: 6.7 10*3/uL (ref 4.0–10.5)

## 2012-09-10 LAB — HEPATITIS PANEL, ACUTE: Hep B C IgM: NEGATIVE

## 2012-09-10 LAB — BASIC METABOLIC PANEL
CO2: 22 mEq/L (ref 19–32)
Calcium: 9.1 mg/dL (ref 8.4–10.5)
GFR calc Af Amer: 56 mL/min — ABNORMAL LOW (ref >90)
Sodium: 140 mEq/L (ref 135–145)

## 2012-09-10 LAB — TYPE AND SCREEN

## 2012-09-10 LAB — CATH TIP CULTURE

## 2012-09-10 LAB — URINE CULTURE: Colony Count: >=100000

## 2012-09-10 MED ORDER — LORAZEPAM 1 MG PO TABS
1.0000 mg | ORAL_TABLET | Freq: Three times a day (TID) | ORAL | Status: DC | PRN
  Administered 2012-09-10: 1 mg via ORAL
  Filled 2012-09-10: qty 1

## 2012-09-10 MED ORDER — CLONIDINE HCL 0.1 MG PO TABS: 0.1000 mg | ORAL_TABLET | Freq: Three times a day (TID) | ORAL | Status: DC

## 2012-09-10 MED ORDER — PANTOPRAZOLE SODIUM 40 MG PO TBEC: 40.0000 mg | DELAYED_RELEASE_TABLET | Freq: Two times a day (BID) | ORAL | Status: DC

## 2012-09-10 MED ORDER — CLONAZEPAM 1 MG PO TABS: 1.0000 mg | ORAL_TABLET | Freq: Three times a day (TID) | ORAL | Status: DC | PRN

## 2012-09-10 MED ORDER — SODIUM CHLORIDE 0.9 % IJ SOLN
INTRAMUSCULAR | Status: AC
  Administered 2012-09-10: 11:00:00
  Filled 2012-09-10: qty 3

## 2012-09-10 MED ORDER — CLONIDINE HCL 0.1 MG PO TABS: 0.1000 mg | ORAL_TABLET | Freq: Every day | ORAL | Status: DC

## 2012-09-10 MED ORDER — SULFAMETHOXAZOLE-TMP DS 800-160 MG PO TABS: 1.0000 | ORAL_TABLET | Freq: Two times a day (BID) | ORAL | Status: AC

## 2012-09-10 MED ORDER — TRAMADOL HCL 50 MG PO TABS: 50.0000 mg | ORAL_TABLET | Freq: Four times a day (QID) | ORAL | Status: DC | PRN

## 2012-09-10 NOTE — Plan of Care (Signed)
Problem: Phase II Progression Outcomes Goal: Progress activity as tolerated unless otherwise ordered Outcome: Completed/Met Date Met:  09/10/12 09/10/12 1622 patient ambulates in room and hallway frequently today.

## 2012-09-10 NOTE — Progress Notes (Signed)
09/10/12 1624 Patient discharged home. Reviewed discharge instructions with patient, given copy of instructions, medication list, prescriptions, f/u appointments. Noted which medications already received today on instructions. Verbalized understanding of instructions. Instructed to keep f/u appointments as scheduled, stated would do so. IV site d/c'd and within normal limits. No c/o pain or other discomfort at discharge. Pt to take prilosec over the counter if needed per Toya Smothers, NP, discussed with patient. Pt left floor in stable condition via w/c accompanied by nurse tech. Earnstine Regal, RN

## 2012-09-10 NOTE — Progress Notes (Signed)
Subjective: No abdominal pain, no signs of rectal bleeding, +nausea no vomiting.   Objective: Vital signs in last 24 hours: Temp:  [97.5 F (36.4 C)-98.2 F (36.8 C)] 97.5 F (36.4 C) (02/20 0551) Pulse Rate:  [62-82] 62 (02/20 0551) Resp:  [18] 18 (02/20 0551) BP: (100-119)/(65-73) 100/65 mmHg (02/20 0551) SpO2:  [98 %-100 %] 99 % (02/20 0551) Last BM Date: 09/09/12 General:   Alert and oriented, pleasant Head:  Normocephalic and atraumatic. Eyes:  No icterus, sclera clear. Conjuctiva pink.  Mouth:  Without lesions, mucosa pink and moist.  Heart:  S1, S2 present, no murmurs noted.  Lungs: Clear to auscultation bilaterally, without wheezing, rales, or rhonchi.  Abdomen:  Bowel sounds present, soft, non-tender, non-distended. No HSM or hernias noted. No rebound or guarding. No masses appreciated  Msk:  Symmetrical without gross deformities. Normal posture. Neurologic:  Alert and  oriented x4;  grossly normal neurologically. Skin:  Warm and dry, intact without significant lesions.  Psych:  Alert and cooperative. Normal mood and affect.  Intake/Output from previous day: 02/19 0701 - 02/20 0700 In: 1200 [P.O.:1200] Out: -  Intake/Output this shift:    Lab Results:  Recent Labs  09/08/12 0457 09/09/12 0456 09/10/12 0516  WBC 8.7 6.2 6.7  HGB 6.7* 7.0* 7.7*  HCT 20.6* 22.4* 24.3*  PLT 146* 178 225   BMET  Recent Labs  09/07/12 1947 09/09/12 0456 09/10/12 0516  NA 137 142 140  K 3.0* 4.2 4.5  CL 104 111 107  CO2 22 21 22   GLUCOSE 122* 108* 86  BUN 20 14 15   CREATININE 1.69* 1.39* 1.35*  CALCIUM 8.3* 9.0 9.1   LFT  Recent Labs  09/07/12 1947  PROT 6.9  ALBUMIN 2.8*  AST 21  ALT 14  ALKPHOS 53  BILITOT 0.3   PT/INR  Recent Labs  09/07/12 1947  LABPROT 13.3  INR 1.02   Assessment: 42 year old female with hemolytic anemia, hx of chronic disease, improving Hgb. No signs of GI bleeding. Hepatitis panel still pending, pt with history of HCV. Nausea  still noted but tolerating diet; nausea secondary to history of gastroparesis likely.   Plan: Continue BID PPI Zofran prn Will contact lab regarding hepatitis panel Signing off as no signs of GI bleed Outpatient follow-up in next few months    LOS: 4 days   Gerrit Halls  09/10/2012, 7:55 AM

## 2012-09-10 NOTE — Discharge Summary (Addendum)
Physician Discharge Summary  REX MAGEE AOZ:[id number] DOB: 1970/10/09 DOA: 09/06/2012  [id number]  Admit date: 09/06/2012 Discharge date: 09/10/2012  Time spent: 40 minutes  Recommendations for Outpatient Follow-up:  1. Appointment [facility] Hawking 09/17/12 for follow up CBC 2. Appointment Odessa Fleming 09/29/12 for evaluation of anemia 3. Follow up with PCP in 1 week. Recommend BMET for evaluation of renal function and evaluation of BP. PCP to evaluate results and determine medications best for optimal BP control. ACE and HCTZ held during hospitalization for acute renal failure..  4. She will followup with her psychiatrist, Dr. Christene Lye follow up for management of anxiety 5. She will followup with gastroenterologist, Dr Jena Gauss in 2-3 months  Discharge Diagnoses:  Principal Problem:   Anemia Active Problems:   HYPERTENSION   GERD   LOW BACK PAIN   Tobacco abuse   Hypokalemia   Blood in stool   Gram-negative bacteremia   Acute renal failure   Urinary tract infection, site not specified   Polysubstance dependence including opioid type drug, episodic abuse   Discharge Condition: stable  Diet recommendation: Dysphagia 3 thin liquid  Filed Weights   09/06/12 1231 09/06/12 1732  Weight: 72.576 kg (160 lb) 72.576 kg (160 lb)    History of present illness:  [name] is a 41 y.o. female with history of recent psoas abscess status post percutaneous drain, was completed a four-week course of IV vancomycin. On her previous admission approximately one month ago, she was noted to be somewhat anemic. She did not have any frank bleeding at that time. Her hemoglobin was in the 8 range. She initially presented to the emergency room approximately 4 days prior to this presentation on 09/06/12. She left the hospital AGAINST MEDICAL ADVICE when she was told she would not receive any opiate medications. She came back to the emergency room 09/06/12 with complaints of blood in her  stool. She reported dark blood in the toilet today. She denied any hematemesis. She's not had any previous episodes in the past. She reported feeling generally weak but denied any chest pain or shortness of breath or dizziness. She reported taking Aleve approximately twice a week for back pain. She  also reported having acid reflux and takes TUMS frequently. She reported that she is finishing up her menstrual cycle and her menstrual flow has not been any heavier than it has normally been. She also has chronic back pain. She was evaluated in the emergency room and found to have a low hemoglobin of 6. Per ER staff, rectal exam did not show any gross blood and was Hemoccult negative. She was noted to have a single external hemorrhoid which was nontender and not bleeding.       Hospital Course:  Anemia:  Admitted to tele. Transfused 1 unit PRBC's and had reaction so second unit cancelled. Hg slowly trended up. On day of discharge Hg 7.7. FOBT neg.  Pt seen by  GI who opined no evidence GI bleed at this time. Recommended proton pump inhibitors and follow up in 2-3 months. 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 Elevated LDH, Negative DAT. Anemia panel is indicative of anemia of chronic disease. Reticulocyte count is indicative of compensatory mechanism. History Hep C.  Recommend repeat CBC in 1 week and follow up on Hep panel. Pt has appointment 09/17/12 for lab work and 09/29/12 for follow up appointment with heme/onc.  Query rectal bleeding. The patient's stool was heme negative. She was  noted to be on her menustral period. The GI team evaluated her and recommended continued PPI and an outpatient follow up. There was no indication for endoscopic evaluation. Of note, she has a history of gastritis, reflux esophagitis and gastroparesis.   Gm. Neg Bacteremia without sepsis secondary to Klebsiella. Likely related to PICC line in association with Klebsiella UTI. PICC  removed 09/07/12. Culture of tip reveals no growth to date. Remained afebrile, non-toxic appearing during hospitalization. Received 3 days of  Zosyn. Transitioned to Septra DS on 09/09/12 for total 14 days per recommendations by infectious diseases physician, Dr. Orvan Falconer from ID.  Klebsiella and E.Coli UTI, per urine culture. Likely source of bacteremia. Treated with antibiotics above. .  Acute renal failure: Likely secondary to ATN and infections in the setting of ACE inhibitor therapy. Creatinine trending downward to baseline. Given IV fluids and ACE and HCTZ were held. Recommend follow up with PCP 1 week for BMET to evaluate renal function and medications.    Hypokalemia. Replaced and resolved.    Hypertension. Stable . ACE-I and HCTZ held due to ARF. HTN managed with clonidine, initially TID. At discharge reduced dose of clonidine to daily for SBP in the100s. Will likely need to transition back to ACE-I and HCTZ once renal function improves. Will defer to PCP at [facility]  Low back pain. Chronic issue. Patient was informed that she'll not receive any opiates.   Recent psoas abscess. Repeat CT scan yields no abscess. Of note, she completed more than 4 weeks of IV vancomycin.   Polysubstance abuse. The patient was encouraged to stop abusing street drugs and opiates. CSW was consulted and provided patient with information on outpatient services that could help her.      Procedures:  none  Consultations:  GI  hematology  Discharge Exam: Filed Vitals:   09/09/12 1443 09/09/12 2056 09/10/12 0551 09/10/12 1445  BP: 119/73 110/71 100/65 101/55  Pulse: 82 73 62 64  Temp: 98 F (36.7 C) 98.2 F (36.8 C) 97.5 F (36.4 C)   TempSrc:  Oral    Resp: 18 18 18    Height:      Weight:      SpO2: 100% 99% 99% 97%    General: awake alert ambulating in hall Cardiovascular: RRR No MGR No LEE Respiratory: CTA bilaterally.  Discharge Instructions      Discharge Orders    Future Appointments Provider Department Dept Phone   09/17/2012 10:10 AM [facility] [phone]   09/29/2012 2:00 PM Ellouise Newer, [place] [facility] [phone]   Future Orders Complete By Expires     Diet - low sodium heart healthy  As directed     Increase activity slowly  As directed         Medication List    STOP taking these medications       lisinopril-hydrochlorothiazide 10-12.5 MG per tablet  Commonly known as:  PRINZIDE,ZESTORETIC     sodium chloride 0.9 % SOLN 500 mL with vancomycin 10 G SOLR      TAKE these medications       clonazePAM 1 MG tablet  Commonly known as:  KLONOPIN  Take 1 tablet (1 mg total) by mouth 3 (three) times daily as needed for anxiety.     cloNIDine 0.1 MG tablet  Commonly known as:  CATAPRES  Take 1 tablet (0.1 mg total) by mouth daily.     pantoprazole 40 MG tablet  Commonly known as:  PROTONIX  Take 1 tablet (40 mg total) by mouth 2 (two) times daily before a meal.     sennosides-docusate sodium 8.6-50 MG tablet  Commonly known as:  SENOKOT-S  Take 1 tablet by mouth daily as needed for constipation.     sulfamethoxazole-trimethoprim 800-160 MG per tablet  Commonly known as:  BACTRIM DS  Take 1 tablet by mouth every 12 (twelve) hours.     traMADol 50 MG tablet  Commonly known as:  ULTRAM  Take 1 tablet (50 mg total) by mouth every 6 (six) hours as needed.       Follow-up Information   Follow up with [facility] center. (Will need follow up in 1 week. Recommend BMEt to evaluate renal fuction. )       Follow up with [facility] On 09/17/2012. (CBC at 10:10 am)    Contact information:   [address] [place] [phone]       Follow up with Dellis Anes, PA On 09/29/2012. (has appointmen at 2pm)    Contact information:   [address] [place] [address] [phone]       Follow up with Dr. Christene Lye. (follow up OP managemtent of anxiety.)         The results of significant diagnostics from this hospitalization (including imaging, microbiology, ancillary and laboratory) are listed below for reference.    Significant Diagnostic Studies: Dg Chest 1 View  09/07/2012  *RADIOLOGY REPORT*  Clinical Data: Fever.  CHEST - 1 VIEW  Comparison: 08/19/2012  Findings: The right-sided PICC line is poorly visualized centrally. It may terminate more inferiorly, over the mid right atrium.  Normal heart size.  No pleural effusion or pneumothorax.  Clear lungs.  IMPRESSION: No acute cardiopulmonary disease.  Right-sided PICC line poorly visualized centrally.  May terminate over the mid right atrium.  Consider PA and lateral plain film correlation.   Original Report Authenticated By: Jeronimo Greaves, M.D.    Dg Chest 2 View  08/19/2012  *RADIOLOGY REPORT*  Clinical Data: Arm pain.  CHEST - 2 VIEW  Comparison: August 06, 2012.  Findings: Cardiomediastinal silhouette appears normal.  No acute pulmonary disease is noted.  Bony thorax is intact.  Right-sided PICC line is unchanged in position.  IMPRESSION: No acute cardiopulmonary abnormality seen.   Original Report Authenticated By: Lupita Raider.,  M.D.    Ct Abdomen Pelvis W Contrast  09/07/2012  *RADIOLOGY REPORT*  Clinical Data: Abdominal pain. Evaluate abscess.  CT ABDOMEN AND PELVIS WITH CONTRAST  Technique:  Multidetector CT imaging of the abdomen and pelvis was performed following the standard protocol during bolus administration of intravenous contrast.  Contrast: 1 OMNIPAQUE IOHEXOL 300 MG/ML  SOLN, 80mL OMNIPAQUE IOHEXOL 300 MG/ML  SOLN  Comparison: 08/24/2012.  Findings: The sella as abscess drainage catheter has been removed. There is minimal residual thickening of the left psoas muscle but no discrete fluid collection to suggest a residual or recurrent abscess.  Both kidneys demonstrate normal perfusion.  No findings for pyelonephritis or renal abscess.  No hydronephrosis or obstructing ureteral  calculi.  The liver is stable.  Stable hemangioma in the right hepatic lobe near the IVC.  The spleen is normal and stable.  The pancreas and adrenal glands are unremarkable and unchanged.  Stable borderline retroperitoneal lymph nodes, likely inflammatory.  The pelvis is unremarkable and stable.  The stomach, duodenum, small bowel and colon unremarkable and unchanged.  IMPRESSION: Resolution of the left psoas abscess without recurrent  fluid collection/abscess.  Mild stable thickening of the muscle.   Original Report Authenticated By: Rudie Meyer, M.D.    Ct Abdomen Pelvis W Contrast  08/24/2012  *RADIOLOGY REPORT*  Clinical Data: Left psoas muscle drainage 08/05/2012.  The patient states no longer draining and would like removed.  History of hepatitis C and hypertension.  Prior appendectomy and tubal ligation.  CT ABDOMEN AND PELVIS WITH CONTRAST  Technique:  Multidetector CT imaging of the abdomen and pelvis was performed following the standard protocol during bolus administration of intravenous contrast.  Contrast: OMNIPAQUE IOHEXOL 300 MG/ML  SOLN  Comparison: 08/06/2012 and 08/05/2012.  Findings: Left psoas muscle drainage catheter in place with almost complete resolution of the previously noted left psoas muscle abscess.  What remains may represent residual infection or reaction to infection.  Decreased displacement of the left kidney.  Very minimal haziness of the left kidney may be normal.  Pyelonephritis not entirely excluded in the proper clinical setting.  There remain scattered increased number of normal size to slightly enlarged lymph nodes retroperitoneal and pelvic region.  It is possible these lymph nodes are reactive in origin rather than related to malignancy.  Fatty infiltration of the liver without worrisome focal mass.  No worrisome focal splenic, pancreatic, adrenal or right renal mass.  No clear extension of left psoas inflammatory process into the spinal canal.  If this were of  concern,  MR would then need to be obtained for further delineation.  Small hiatal hernia.  No extraluminal bowel inflammatory process or free air.  No calcified gallstones.  Slightly advanced atherosclerotic type changes with mild plaques/calcification of the aortic bifurcation.  IMPRESSION: Left psoas muscle drainage catheter in place with almost complete resolution of the previously noted left psoas muscle abscess.  What remains may represent residual infection or reaction to infection. Decreased displacement of the left kidney.  Very minimal haziness of the left kidney may be normal.  Pyelonephritis not entirely excluded in the proper clinical setting.  There remain scattered increased number of normal size to slightly enlarged lymph nodes retroperitoneal and pelvic region.  It is possible these lymph nodes are reactive in origin rather than related to malignancy.  Please see above.   Original Report Authenticated By: Lacy Duverney, M.D.     Microbiology: Recent Results (from the past 240 hour(s))  CULTURE, BLOOD (ROUTINE X 2)     Status: None   Collection Time    09/06/12 10:53 PM      Result Value Range Status   Specimen Description BLOOD LEFT ANTECUBITAL   Final   Special Requests BOTTLES DRAWN AEROBIC AND ANAEROBIC 8CC   Final   Culture  Setup Time 09/07/2012 12:20   Final   Culture     Final   Value: KLEBSIELLA PNEUMONIAE     Note: Performed at [facility] Gram Stain Report Called to,Read Back By and Verified With: MORRIS C. @12 :47 ON 09/07/12 BY BAUGHAM   Report Status 09/09/2012 FINAL   Final   Organism ID, Bacteria KLEBSIELLA PNEUMONIAE   Final  CULTURE, BLOOD (ROUTINE X 2)     Status: None   Collection Time    09/06/12 11:00 PM      Result Value Range Status   Specimen Description BLOOD LEFT HAND   Final   Special Requests BOTTLES DRAWN AEROBIC AND ANAEROBIC [facility]   Final   Culture  Setup Time 09/07/2012 12:30   Final   Culture     Final  Value: KLEBSIELLA PNEUMONIAE      Note: SUSCEPTIBILITIES PERFORMED ON PREVIOUS CULTURE WITHIN THE LAST 5 DAYS.     Note: Performed at [facility] Gram Stain Report Called to,Read Back By and Verified With: MORRIS C. @12 :47 ON 09/07/12 BY BAUGHAM  M   Report Status 09/09/2012 FINAL   Final  MRSA PCR SCREENING     Status: None   Collection Time    09/07/12  9:27 AM      Result Value Range Status   MRSA by PCR NEGATIVE  NEGATIVE Final   Comment:            The GeneXpert MRSA Assay (FDA     approved for NASAL specimens     only), is one component of a     comprehensive MRSA colonization     surveillance program. It is not     intended to diagnose MRSA     infection nor to guide or     monitor treatment for     MRSA infections.  CATH TIP CULTURE     Status: None   Collection Time    09/07/12  6:30 PM      Result Value Range Status   Specimen Description CATH TIP PICC LINE   Final   Special Requests NONE   Final   Culture NO GROWTH 2 DAYS   Final   Report Status 09/10/2012 FINAL   Final  URINE CULTURE     Status: None   Collection Time    09/08/12 12:59 AM      Result Value Range Status   Specimen Description URINE, CLEAN CATCH   Final   Special Requests NONE   Final   Culture  Setup Time 09/08/2012 01:40   Final   Colony Count >=100,000 COLONIES/ML   Final   Culture     Final   Value: KLEBSIELLA PNEUMONIAE     ESCHERICHIA COLI   Report Status 09/10/2012 FINAL   Final   Organism ID, Bacteria KLEBSIELLA PNEUMONIAE   Final   Organism ID, Bacteria ESCHERICHIA COLI   Final  CULTURE, BLOOD (ROUTINE X 2)     Status: None   Collection Time    09/09/12 12:17 PM      Result Value Range Status   Specimen Description BLOOD RIGHT ANTECUBITAL   Final   Special Requests BOTTLES DRAWN AEROBIC AND ANAEROBIC 8CC   Final   Culture NO GROWTH 1 DAY   Final   Report Status PENDING   Incomplete  CULTURE, BLOOD (ROUTINE X 2)     Status: None   Collection Time    09/09/12 12:17 PM      Result Value Range Status    Specimen Description BLOOD RIGHT HAND   Final   Special Requests BOTTLES DRAWN AEROBIC AND ANAEROBIC 7CC   Final   Culture NO GROWTH 1 DAY   Final   Report Status PENDING   Incomplete     Labs: Basic Metabolic Panel:  Recent Labs Lab 09/06/12 1345 09/07/12 0504 09/07/12 1947 09/09/12 0456 09/10/12 0516  NA 137 135 137 142 140  K 3.4* 3.9 3.0* 4.2 4.5  CL 101 104 104 111 107  CO2 26 21 22 21 22   GLUCOSE 95 124* 122* 108* 86  BUN 18 20 20 14 15   CREATININE 1.36* 1.72* 1.69* 1.39* 1.35*  CALCIUM 9.1 8.2* 8.3* 9.0 9.1  MG  --   --   --  1.7  --  Liver Function Tests:  Recent Labs Lab 09/06/12 1324 09/07/12 1947  AST 20 21  ALT 11 14  ALKPHOS 46 53  BILITOT 0.4 0.3  PROT 7.5 6.9  ALBUMIN 3.3* 2.8*   No results found for this basename: LIPASE, AMYLASE,  in the last 168 hours No results found for this basename: AMMONIA,  in the last 168 hours CBC:  Recent Labs Lab 09/06/12 1314 09/07/12 0504 09/08/12 0457 09/09/12 0456 09/10/12 0516  WBC 8.6 21.0* 8.7 6.2 6.7  NEUTROABS 4.2  --  4.4  --   --   HGB 6.0* 7.1* 6.7* 7.0* 7.7*  HCT 18.7* 21.7* 20.6* 22.4* 24.3*  MCV 91.2 91.2 91.6 94.1 93.8  PLT 149* 127* 146* 178 225   Cardiac Enzymes: No results found for this basename: CKTOTAL, CKMB, CKMBINDEX, TROPONINI,  in the last 168 hours BNP: BNP (last 3 results) No results found for this basename: PROBNP,  in the last 8760 hours CBG: No results found for this basename: GLUCAP,  in the last 168 hours       Attending Addendum: The patient was seen and examined. She was discussed with NP, Ms. Vedia Coffer. The above note was annotated and amended accordingly.    Jennifer Cousin, MD   Signed:  Treyshon Buchanon  [facility] 09/10/2012, 8:50 PM

## 2012-09-11 NOTE — Discharge Summary (Signed)
Addendum to discharge summary:  Called patient to tell her to change Prilosec to Protonix 40mg  po BID.   Prescription called in to [facility] [address] [place]   Pt verbalized understanding to start taking Protonix instead of Prilosec.

## 2012-09-14 ENCOUNTER — Telehealth: Payer: Self-pay | Admitting: Gastroenterology

## 2012-09-14 LAB — CULTURE, BLOOD (ROUTINE X 2): Culture: NO GROWTH

## 2012-09-14 NOTE — Telephone Encounter (Signed)
Pt needs outpatient hospital follow-up in 4-6 weeks. Thanks!

## 2012-09-16 NOTE — Telephone Encounter (Signed)
Pt is aware of OV on 4/2 at 10 with [facility] and appt card was mailed

## 2012-09-17 ENCOUNTER — Other Ambulatory Visit (HOSPITAL_COMMUNITY): Payer: Medicaid Other

## 2012-09-28 NOTE — Progress Notes (Signed)
-   no show.  Letter sent-  

## 2012-09-29 ENCOUNTER — Encounter (HOSPITAL_COMMUNITY): Payer: Self-pay | Admitting: Oncology

## 2012-09-29 ENCOUNTER — Ambulatory Visit (HOSPITAL_COMMUNITY): Payer: Medicaid Other | Admitting: Oncology

## 2012-10-21 ENCOUNTER — Ambulatory Visit: Payer: Medicaid Other | Admitting: Gastroenterology

## 2012-10-29 ENCOUNTER — Ambulatory Visit: Payer: Medicaid Other | Admitting: Gastroenterology

## 2012-10-29 ENCOUNTER — Telehealth: Payer: Self-pay | Admitting: Gastroenterology

## 2012-10-29 NOTE — Telephone Encounter (Signed)
Pt was a no show

## 2012-10-30 NOTE — Telephone Encounter (Signed)
Offer to patient to reschedule.

## 2012-11-02 ENCOUNTER — Encounter: Payer: Self-pay | Admitting: Gastroenterology

## 2012-11-02 ENCOUNTER — Encounter (HOSPITAL_COMMUNITY): Payer: Self-pay | Admitting: Oncology

## 2012-11-02 NOTE — Telephone Encounter (Signed)
Mailed letter to patient to call our office to [facility] and not to neglect her health °

## 2012-11-24 ENCOUNTER — Emergency Department (HOSPITAL_COMMUNITY)
Admission: EM | Admit: 2012-11-24 | Discharge: 2012-11-24 | Disposition: A | Discharge status: Home / Self Care | Payer: Medicaid Other | Attending: Emergency Medicine | Admitting: Emergency Medicine

## 2012-11-24 ENCOUNTER — Emergency Department (HOSPITAL_COMMUNITY): Payer: Medicaid Other

## 2012-11-24 ENCOUNTER — Encounter (HOSPITAL_COMMUNITY): Payer: Self-pay | Admitting: *Deleted

## 2012-11-24 DIAGNOSIS — Z9119 Patient's noncompliance with other medical treatment and regimen: Secondary | ICD-10-CM | POA: Insufficient documentation

## 2012-11-24 DIAGNOSIS — I10 Essential (primary) hypertension: Secondary | ICD-10-CM | POA: Insufficient documentation

## 2012-11-24 DIAGNOSIS — Z8719 Personal history of other diseases of the digestive system: Secondary | ICD-10-CM | POA: Insufficient documentation

## 2012-11-24 DIAGNOSIS — Z8619 Personal history of other infectious and parasitic diseases: Secondary | ICD-10-CM | POA: Insufficient documentation

## 2012-11-24 DIAGNOSIS — F3289 Other specified depressive episodes: Secondary | ICD-10-CM | POA: Insufficient documentation

## 2012-11-24 DIAGNOSIS — Z91199 Patient's noncompliance with other medical treatment and regimen due to unspecified reason: Secondary | ICD-10-CM | POA: Insufficient documentation

## 2012-11-24 DIAGNOSIS — F17200 Nicotine dependence, unspecified, uncomplicated: Secondary | ICD-10-CM | POA: Insufficient documentation

## 2012-11-24 DIAGNOSIS — E876 Hypokalemia: Secondary | ICD-10-CM

## 2012-11-24 DIAGNOSIS — F1920 Other psychoactive substance dependence, uncomplicated: Secondary | ICD-10-CM | POA: Insufficient documentation

## 2012-11-24 DIAGNOSIS — Z8542 Personal history of malignant neoplasm of other parts of uterus: Secondary | ICD-10-CM | POA: Insufficient documentation

## 2012-11-24 DIAGNOSIS — Z79899 Other long term (current) drug therapy: Secondary | ICD-10-CM | POA: Insufficient documentation

## 2012-11-24 DIAGNOSIS — Z9114 Patient's other noncompliance with medication regimen: Secondary | ICD-10-CM

## 2012-11-24 DIAGNOSIS — Z91148 Patient's other noncompliance with medication regimen for other reason: Secondary | ICD-10-CM

## 2012-11-24 DIAGNOSIS — F329 Major depressive disorder, single episode, unspecified: Secondary | ICD-10-CM | POA: Insufficient documentation

## 2012-11-24 DIAGNOSIS — Z87442 Personal history of urinary calculi: Secondary | ICD-10-CM | POA: Insufficient documentation

## 2012-11-24 DIAGNOSIS — K219 Gastro-esophageal reflux disease without esophagitis: Secondary | ICD-10-CM | POA: Insufficient documentation

## 2012-11-24 DIAGNOSIS — F1910 Other psychoactive substance abuse, uncomplicated: Secondary | ICD-10-CM

## 2012-11-24 LAB — TROPONIN I: Troponin I: <0.3 ng/mL (ref <0.30)

## 2012-11-24 LAB — BASIC METABOLIC PANEL
Calcium: 9.1 mg/dL (ref 8.4–10.5)
GFR calc Af Amer: >90 mL/min (ref >90)
GFR calc non Af Amer: 81 mL/min — ABNORMAL LOW (ref >90)
Glucose, Bld: 125 mg/dL — ABNORMAL HIGH (ref 70–99)
Potassium: 3 mEq/L — ABNORMAL LOW (ref 3.5–5.1)
Sodium: 137 mEq/L (ref 135–145)

## 2012-11-24 LAB — RAPID URINE DRUG SCREEN, HOSP PERFORMED
Cocaine: POSITIVE — AB
Opiates: POSITIVE — AB
Tetrahydrocannabinol: POSITIVE — AB

## 2012-11-24 MED ORDER — PROMETHAZINE HCL 12.5 MG PO TABS
12.5000 mg | ORAL_TABLET | Freq: Once | ORAL | Status: AC
  Administered 2012-11-24: 12.5 mg via ORAL
  Filled 2012-11-24: qty 1

## 2012-11-24 MED ORDER — ALPRAZOLAM 0.5 MG PO TABS
1.0000 mg | ORAL_TABLET | Freq: Once | ORAL | Status: AC
  Administered 2012-11-24: 1 mg via ORAL
  Filled 2012-11-24: qty 2

## 2012-11-24 MED ORDER — POTASSIUM CHLORIDE CRYS ER 20 MEQ PO TBCR
40.0000 meq | EXTENDED_RELEASE_TABLET | Freq: Once | ORAL | Status: AC
  Administered 2012-11-24: 40 meq via ORAL
  Filled 2012-11-24: qty 2

## 2012-11-24 MED ORDER — ALPRAZOLAM 1 MG PO TABS: ORAL_TABLET | ORAL | Status: DC

## 2012-11-24 MED ORDER — HYDROCODONE-ACETAMINOPHEN 5-325 MG PO TABS
2.0000 | ORAL_TABLET | Freq: Once | ORAL | Status: AC
  Administered 2012-11-24: 2 via ORAL
  Filled 2012-11-24: qty 2

## 2012-11-24 MED ORDER — HYDROCODONE-ACETAMINOPHEN 5-325 MG PO TABS: 1.0000 | ORAL_TABLET | ORAL | Status: DC | PRN

## 2012-11-24 NOTE — ED Notes (Signed)
Patient complaining of headache. PA notified.

## 2012-11-24 NOTE — ED Provider Notes (Signed)
History     CSN: [id number]  Arrival date & time 11/24/12  2154   First MD Initiated Contact with Patient 11/24/12 2213      Chief Complaint  Patient presents with  . Chest Pain    (Consider location/radiation/quality/duration/timing/severity/associated sxs/prior treatment) HPI Comments: Patient presents to the emergency department with complaint of chest pain, panic, and pain radiating down the left arm. The patient states that she used cocaine and marijuana. She states at least 3 days ago and may be similar. Tonight after eating she got up to go back into the kitchen when she began to have chest pain and had a near fall. She struck her hip on a piece of furniture in the kitchen. Upon her arrival to the emergency department she was noted to be awake and alert and requesting medication for pain and something eat. She denies sweats. She has not had vomiting. She's not had a loss of control of bowel or bladder. She denies history of coronary artery disease.  The patient also states that she is on Xanax and she has not had it for" 3 days". The patient states that she was supposed to go to the doctor today but just did not feel well enough to go. She also states that she is very anxious because she is supposed to be in court on tomorrow, may 7. Patient complains of a headache. She denies hitting her head on anything.  The patient denies suicidal or homicidal ideations. She denies any hallucinations. But states that she is very nervous and panicky.  The history is provided by the patient.    Past Medical History  Diagnosis Date  . Hypertension   . Depressed   . Drug addiction Hep C  . Hepatitis C     pt denies any treatment  . Gastroparesis   . Gastroesophageal reflux disease   . Polysubstance dependence including opioid type drug, episodic abuse   . Cancer   . Cervical cancer   . Renal disorder     kidney stones    Past Surgical History  Procedure Laterality Date  . Appendectomy     . Tubal ligation    . Mandible surgery for fracture    . Esophagogastroduodenoscopy  01/04/2008    Dr Teddy Spike reflux esophagitis, sm HH, gastritis, BRAVO positive for GERD on BID PPI    No family history on file.  History  Substance Use Topics  . Smoking status: Current Every Day Smoker -- 1.00 packs/day for 15 years    Types: Cigarettes  . Smokeless tobacco: Not on file  . Alcohol Use: No    OB History   Grav Para Term Preterm Abortions TAB SAB Ect Mult Living                  Review of Systems  Constitutional: Negative for activity change.       All ROS Neg except as noted in HPI  HENT: Negative for nosebleeds and neck pain.   Eyes: Negative for photophobia and discharge.  Respiratory: Negative for wheezing.   Gastrointestinal: Negative for blood in stool.  Genitourinary: Negative for dysuria, frequency and hematuria.  Musculoskeletal: Negative for arthralgias.  Skin: Negative.   Neurological: Negative for seizures and speech difficulty.  Psychiatric/Behavioral: Negative for suicidal ideas, hallucinations and confusion. The patient is nervous/anxious.        Depression    Allergies  Tramadol hcl; Ketorolac tromethamine; and Propoxyphene napsylate  Home Medications   Current Outpatient Rx  Name  Route  Sig  Dispense  Refill  . clonazePAM (KLONOPIN) 1 MG tablet   Oral   Take 1 tablet (1 mg total) by mouth 3 (three) times daily as needed for anxiety.   30 tablet   0   . cloNIDine (CATAPRES) 0.1 MG tablet   Oral   Take 1 tablet (0.1 mg total) by mouth daily.   60 tablet   0   . pantoprazole (PROTONIX) 40 MG tablet   Oral   Take 1 tablet (40 mg total) by mouth 2 (two) times daily before a meal.   30 tablet   0   . sennosides-docusate sodium (SENOKOT-S) 8.6-50 MG tablet   Oral   Take 1 tablet by mouth daily as needed for constipation.         . traMADol (ULTRAM) 50 MG tablet   Oral   Take 1 tablet (50 mg total) by mouth every 6 (six) hours as  needed.   20 tablet   0     BP 111/78  Pulse 102  Temp(Src) 98.8 F (37.1 C) (Oral)  Resp 20  Ht 5\' 3"  (1.6 m)  Wt 165 lb (74.844 kg)  BMI 29.24 kg/m2  SpO2 97%  LMP 10/25/2012  Physical Exam  Nursing note and vitals reviewed. Constitutional: She appears well-developed and well-nourished.  Non-toxic appearance.  HENT:  Head: Normocephalic.  Right Ear: Tympanic membrane and external ear normal.  Left Ear: Tympanic membrane and external ear normal.  Eyes: EOM and lids are normal. Pupils are equal, round, and reactive to light.  Neck: Normal range of motion. Neck supple. Carotid bruit is not present.  Cardiovascular: Regular rhythm, normal heart sounds, intact distal pulses and normal pulses.  Tachycardia present.   Pulmonary/Chest: Breath sounds normal. No respiratory distress.  Abdominal: Soft. Bowel sounds are normal. There is no tenderness. There is no guarding.  Musculoskeletal: Normal range of motion.  Lymphadenopathy:       Head (right side): No submandibular adenopathy present.       Head (left side): No submandibular adenopathy present.    She has no cervical adenopathy.  Neurological: She is alert. She has normal strength. No cranial nerve deficit or sensory deficit.  Skin: Skin is warm and dry.  Psychiatric: Her speech is normal. Her mood appears anxious.    ED Course  Procedures (including critical care time)  Labs Reviewed - No data to display No results found.  Date: 11/24/2012  Rate: 98  Rhythm: Normal Sinus  QRS Axis: normal  Intervals: normal  ST/T Wave abnormalities: normal  Conduction Disutrbances:none  Narrative Interpretation:No STEMI  Old EKG Reviewed: unchanged from September 23, 2011   No diagnosis found.    MDM  I have reviewed nursing notes, vital signs, and all appropriate lab and imaging results for this patient. Patient presents to the emergency department with complaint of chest pain, panic, and headache. The patient states that she  has not had her Xanax in 3 days. She also states that over the last 3 days she's been using cocaine and marijuana. Electrocardiogram showed normal sinus rhythm. No acute events appreciated. Urine drug screen reveals positive for opiates, positive for cocaine, positive for marijuana. The troponin is normal at less than 0.30. The potassium is slightly low at 3.0 otherwise within normal limits. A portable chest x-ray shows lower lung volumes but otherwise no acute cardiopulmonary abnormality.  The patient was given Xanax and Norco for headache and also for possible early Xanax withdrawal.  Shortly after this the patient was requesting something to eat, the patient is conversing with a family member in the room, eating chips, crackers, and drinking Sprite. In no distress whatsoever..   I discussed the findings with the patient. I discussed the dangers of using cocaine with the patient. I discussed the outpatient plan with the patient. Which includes prescription for Xanax 1 mg 3 times daily #12 tablets, Norco one every 4 hours as needed for pain #12 tablets. Patient is to see her primary physician on Thursday and she is to be in court on tomorrow Wednesday. Patient denies suicidal or homicidal ideations, and seems to be more calm at discharge. Her     Kathie Dike, PA-C 11/25/12 0005

## 2012-11-24 NOTE — ED Notes (Addendum)
Chest pain, for 45 min with radiation down lt arm .  Used cocaine  And marijuana  App 3 days ago.  No nausea,  Fell when started having chest pain, struck her lt hip.  Alert, talking, NSR, requesting pain med

## 2012-11-24 NOTE — ED Notes (Signed)
Patient ambulatory to restroom to collect urine sample 

## 2012-11-25 NOTE — ED Provider Notes (Signed)
Medical screening examination/treatment/procedure(s) were performed by non-physician practitioner and as supervising physician I was immediately available for consultation/collaboration.  Geoffery Lyons, MD 11/25/12 [phone]

## 2012-11-30 ENCOUNTER — Encounter (HOSPITAL_COMMUNITY): Payer: Self-pay | Admitting: Oncology

## 2012-12-30 ENCOUNTER — Encounter (HOSPITAL_COMMUNITY): Payer: Self-pay | Admitting: Oncology

## 2013-03-25 ENCOUNTER — Encounter: Payer: Self-pay | Admitting: Obstetrics & Gynecology

## 2013-03-25 ENCOUNTER — Encounter: Payer: Self-pay | Admitting: *Deleted

## 2013-05-14 ENCOUNTER — Emergency Department (HOSPITAL_COMMUNITY)
Admission: EM | Admit: 2013-05-14 | Discharge: 2013-05-14 | Disposition: A | Discharge status: Home / Self Care | Payer: Medicaid Other | Attending: Emergency Medicine | Admitting: Emergency Medicine

## 2013-05-14 ENCOUNTER — Encounter (HOSPITAL_COMMUNITY): Payer: Self-pay | Admitting: Emergency Medicine

## 2013-05-14 ENCOUNTER — Emergency Department (HOSPITAL_COMMUNITY): Payer: Medicaid Other

## 2013-05-14 DIAGNOSIS — Z8542 Personal history of malignant neoplasm of other parts of uterus: Secondary | ICD-10-CM | POA: Insufficient documentation

## 2013-05-14 DIAGNOSIS — F3289 Other specified depressive episodes: Secondary | ICD-10-CM | POA: Insufficient documentation

## 2013-05-14 DIAGNOSIS — W108XXA Fall (on) (from) other stairs and steps, initial encounter: Secondary | ICD-10-CM | POA: Insufficient documentation

## 2013-05-14 DIAGNOSIS — Z7982 Long term (current) use of aspirin: Secondary | ICD-10-CM | POA: Insufficient documentation

## 2013-05-14 DIAGNOSIS — Y929 Unspecified place or not applicable: Secondary | ICD-10-CM | POA: Insufficient documentation

## 2013-05-14 DIAGNOSIS — Z87442 Personal history of urinary calculi: Secondary | ICD-10-CM | POA: Insufficient documentation

## 2013-05-14 DIAGNOSIS — S6390XA Sprain of unspecified part of unspecified wrist and hand, initial encounter: Secondary | ICD-10-CM | POA: Insufficient documentation

## 2013-05-14 DIAGNOSIS — F1220 Cannabis dependence, uncomplicated: Secondary | ICD-10-CM | POA: Insufficient documentation

## 2013-05-14 DIAGNOSIS — F1420 Cocaine dependence, uncomplicated: Secondary | ICD-10-CM | POA: Insufficient documentation

## 2013-05-14 DIAGNOSIS — S63619A Unspecified sprain of unspecified finger, initial encounter: Secondary | ICD-10-CM

## 2013-05-14 DIAGNOSIS — I10 Essential (primary) hypertension: Secondary | ICD-10-CM | POA: Insufficient documentation

## 2013-05-14 DIAGNOSIS — F329 Major depressive disorder, single episode, unspecified: Secondary | ICD-10-CM | POA: Insufficient documentation

## 2013-05-14 DIAGNOSIS — K219 Gastro-esophageal reflux disease without esophagitis: Secondary | ICD-10-CM | POA: Insufficient documentation

## 2013-05-14 DIAGNOSIS — F17200 Nicotine dependence, unspecified, uncomplicated: Secondary | ICD-10-CM | POA: Insufficient documentation

## 2013-05-14 DIAGNOSIS — S60222A Contusion of left hand, initial encounter: Secondary | ICD-10-CM

## 2013-05-14 DIAGNOSIS — Y9389 Activity, other specified: Secondary | ICD-10-CM | POA: Insufficient documentation

## 2013-05-14 DIAGNOSIS — S60229A Contusion of unspecified hand, initial encounter: Secondary | ICD-10-CM | POA: Insufficient documentation

## 2013-05-14 DIAGNOSIS — Z79899 Other long term (current) drug therapy: Secondary | ICD-10-CM | POA: Insufficient documentation

## 2013-05-14 DIAGNOSIS — Z8619 Personal history of other infectious and parasitic diseases: Secondary | ICD-10-CM | POA: Insufficient documentation

## 2013-05-14 MED ORDER — ACETAMINOPHEN-CODEINE #3 300-30 MG PO TABS: 1.0000 | ORAL_TABLET | Freq: Four times a day (QID) | ORAL | Status: DC | PRN

## 2013-05-14 MED ORDER — PREDNISONE 50 MG PO TABS
60.0000 mg | ORAL_TABLET | Freq: Once | ORAL | Status: AC
  Administered 2013-05-14: 60 mg via ORAL
  Filled 2013-05-14 (×2): qty 1

## 2013-05-14 MED ORDER — PREDNISONE 10 MG PO TABS: ORAL_TABLET | ORAL | Status: DC

## 2013-05-14 MED ORDER — ACETAMINOPHEN-CODEINE #3 300-30 MG PO TABS
2.0000 | ORAL_TABLET | Freq: Once | ORAL | Status: AC
  Administered 2013-05-14: 2 via ORAL
  Filled 2013-05-14: qty 2

## 2013-05-14 MED ORDER — MUPIROCIN CALCIUM 2 % EX CREA: TOPICAL_CREAM | Freq: Three times a day (TID) | CUTANEOUS | Status: DC

## 2013-05-14 MED ORDER — ONDANSETRON HCL 4 MG PO TABS
4.0000 mg | ORAL_TABLET | Freq: Once | ORAL | Status: AC
  Administered 2013-05-14: 4 mg via ORAL
  Filled 2013-05-14: qty 1

## 2013-05-14 MED ORDER — BACITRACIN-NEOMYCIN-POLYMYXIN 400-5-5000 EX OINT
TOPICAL_OINTMENT | CUTANEOUS | Status: AC
  Filled 2013-05-14: qty 1

## 2013-05-14 NOTE — ED Notes (Signed)
Swelling and bruising to left hand after falling on Tuesday. Pain also to right thumb.

## 2013-05-14 NOTE — ED Provider Notes (Signed)
CSN: [id number]     Arrival date & time 05/14/13  1108 History   First MD Initiated Contact with Patient 05/14/13 1251     Chief Complaint  Patient presents with  . Hand Injury   (Consider location/radiation/quality/duration/timing/severity/associated sxs/prior Treatment) HPI Comments: Pt states she fell down steps 4 days ago. No hx of operations or procedure of the left upper extremity. Pt states the pain is getting progressively worse. There is no dislocation or deformity at the time of the initial fall. The patient is able to use the right thumb, but with pain and soreness. Patient has not been evaluated prior to today's visit for injuries resulting from the fall. The patient denies being on any blood thinning type medications, and denies having any bleeding disorders.  Patient is a 42 y.o. female presenting with hand injury. The history is provided by the patient.  Hand Injury Associated symptoms: no back pain and no neck pain     Past Medical History  Diagnosis Date  . Hypertension   . Depressed   . Drug addiction Hep C  . Hepatitis C     pt denies any treatment  . Gastroparesis   . Gastroesophageal reflux disease   . Polysubstance dependence including opioid type drug, episodic abuse   . Cancer   . Cervical cancer   . Renal disorder     kidney stones   Past Surgical History  Procedure Laterality Date  . Appendectomy    . Tubal ligation    . Mandible surgery for fracture    . Esophagogastroduodenoscopy  01/04/2008    Dr Teddy Spike reflux esophagitis, sm HH, gastritis, BRAVO positive for GERD on BID PPI   No family history on file. History  Substance Use Topics  . Smoking status: Current Every Day Smoker -- 1.00 packs/day for 15 years    Types: Cigarettes  . Smokeless tobacco: Not on file  . Alcohol Use: No   OB History   Grav Para Term Preterm Abortions TAB SAB Ect Mult Living                 Review of Systems  Constitutional: Negative for activity change.        All ROS Neg except as noted in HPI  HENT: Negative for nosebleeds.   Eyes: Negative for photophobia and discharge.  Respiratory: Negative for cough, shortness of breath and wheezing.   Cardiovascular: Negative for chest pain and palpitations.  Gastrointestinal: Negative for abdominal pain and blood in stool.  Genitourinary: Negative for dysuria, frequency and hematuria.  Musculoskeletal: Negative for arthralgias, back pain and neck pain.  Skin: Negative.   Neurological: Negative for dizziness, seizures and speech difficulty.  Psychiatric/Behavioral: Negative for hallucinations and confusion.       Depression    Allergies  Tramadol hcl; Ketorolac tromethamine; and Propoxyphene napsylate  Home Medications   Current Outpatient Rx  Name  Route  Sig  Dispense  Refill  . aspirin EC 81 MG tablet   Oral   Take 81 mg by mouth daily.         . clonazePAM (KLONOPIN) 1 MG tablet   Oral   Take 1 mg by mouth 3 (three) times daily.         . cyclobenzaprine (FLEXERIL) 10 MG tablet   Oral   Take 10 mg by mouth 3 (three) times daily as needed for muscle spasms.         Marland Kitchen gabapentin (NEURONTIN) 300 MG capsule   Oral  Take 300 mg by mouth 2 (two) times daily.         Marland Kitchen lisinopril-hydrochlorothiazide (PRINZIDE,ZESTORETIC) 20-12.5 MG per tablet   Oral   Take 1 tablet by mouth daily.         . ranitidine (ZANTAC) 150 MG tablet   Oral   Take 150 mg by mouth 2 (two) times daily.          BP 145/96  Pulse 87  Temp(Src) 98.5 F (36.9 C) (Oral)  Resp 16  SpO2 98%  LMP 05/09/2013 Physical Exam  Nursing note and vitals reviewed. Constitutional: She is oriented to person, place, and time. She appears well-developed and well-nourished.  Non-toxic appearance.  HENT:  Head: Normocephalic.  Right Ear: Tympanic membrane and external ear normal.  Left Ear: Tympanic membrane and external ear normal.  Eyes: EOM and lids are normal. Pupils are equal, round, and reactive to  light.  Neck: Normal range of motion. Neck supple. Carotid bruit is not present.  Cardiovascular: Normal rate, regular rhythm, normal heart sounds, intact distal pulses and normal pulses.   Pulmonary/Chest: Breath sounds normal. No respiratory distress.  Abdominal: Soft. Bowel sounds are normal. There is no tenderness. There is no guarding.  Musculoskeletal: Normal range of motion.  There are a few bruises of the left hand. There is pain with attempted range of motion of the left thumb. There is no pain in the anatomical snuff box. There is no deformity of the forearm. There is full range of motion of the left elbow.  There is soreness of the right hand. There is no deformity appreciated. Capillary refill is less than 2 seconds. There is no pain in the anatomical snuff box. Is good range of motion of the wrist, but with mild soreness. There is good range of motion of the right elbow.  The radial pulses are 2+ bilaterally.  Lymphadenopathy:       Head (right side): No submandibular adenopathy present.       Head (left side): No submandibular adenopathy present.    She has no cervical adenopathy.  Neurological: She is alert and oriented to person, place, and time. She has normal strength. No cranial nerve deficit or sensory deficit.  Skin: Skin is warm and dry.  Psychiatric: She has a normal mood and affect. Her speech is normal.    ED Course  Procedures (including critical care time) Labs Review Labs Reviewed - No data to display Imaging Review Dg Hand Complete Left  05/14/2013   CLINICAL DATA:  Left hand and thumb pain, fell down stairs  EXAM: LEFT HAND - COMPLETE 3+ VIEW  COMPARISON:  Prior radiographs of the left hand 06/09/2012  FINDINGS: There is no evidence of fracture or dislocation. There is no evidence of arthropathy or other focal bone abnormality. Soft tissues are unremarkable.  IMPRESSION: Negative.   Electronically Signed   By: Malachy Moan M.D.   On: 05/14/2013 12:43    Dg Finger Thumb Right  05/14/2013   CLINICAL DATA:  Left hand and thumb pain, fell down stairs  EXAM: RIGHT THUMB 2+V  COMPARISON:  Concurrently obtained radiographs of the hand  FINDINGS: There is no evidence of fracture or dislocation. There is no evidence of arthropathy or other focal bone abnormality. Soft tissues are unremarkable  IMPRESSION: Negative.   Electronically Signed   By: Malachy Moan M.D.   On: 05/14/2013 12:43    EKG Interpretation   None       MDM  No  diagnosis found. **I have reviewed nursing notes, vital signs, and all appropriate lab and imaging results for this patient.*  X-ray of the left hand is negative for fracture or dislocation. Prescription for Tylenol with Codeine No. 3 and prednisone taper given to the patient. Patient is to follow up with her primary care physician or clinic for additional evaluation and management.  Kathie Dike, PA-C 05/16/13 [place]

## 2013-05-14 NOTE — ED Notes (Signed)
When pt was given to names of the medications she was being given she rolled her eyes and looked at her family members.  rn asked if she did not wan to meds that were ordered. She no she will take them.  She cont.to roll her eyes.  rn asked again, and she stated yes, I told you I did.   After giving meds, pt attempted to leave, to smoke advised of no smoking policy, pt became angry, returned to the exam room and could be heard cursing.

## 2013-05-19 NOTE — ED Provider Notes (Signed)
Medical screening examination/treatment/procedure(s) were performed by non-physician practitioner and as supervising physician I was immediately available for consultation/collaboration.  Citlaly Camplin L Tritia Endo, MD 05/19/13 1627 

## 2013-05-26 ENCOUNTER — Inpatient Hospital Stay (HOSPITAL_COMMUNITY)
Admission: EM | Admit: 2013-05-26 | Discharge: 2013-05-28 | DRG: 379 | Disposition: A | Discharge status: Home / Self Care | Payer: Medicaid Other | Attending: Internal Medicine | Admitting: Internal Medicine

## 2013-05-26 ENCOUNTER — Encounter (HOSPITAL_COMMUNITY): Payer: Self-pay | Admitting: Emergency Medicine

## 2013-05-26 ENCOUNTER — Emergency Department (HOSPITAL_COMMUNITY): Payer: Medicaid Other

## 2013-05-26 DIAGNOSIS — K3184 Gastroparesis: Secondary | ICD-10-CM | POA: Diagnosis present

## 2013-05-26 DIAGNOSIS — Z9119 Patient's noncompliance with other medical treatment and regimen: Secondary | ICD-10-CM

## 2013-05-26 DIAGNOSIS — F17200 Nicotine dependence, unspecified, uncomplicated: Secondary | ICD-10-CM | POA: Diagnosis present

## 2013-05-26 DIAGNOSIS — Z8541 Personal history of malignant neoplasm of cervix uteri: Secondary | ICD-10-CM

## 2013-05-26 DIAGNOSIS — K219 Gastro-esophageal reflux disease without esophagitis: Secondary | ICD-10-CM | POA: Diagnosis present

## 2013-05-26 DIAGNOSIS — F1120 Opioid dependence, uncomplicated: Secondary | ICD-10-CM

## 2013-05-26 DIAGNOSIS — L408 Other psoriasis: Secondary | ICD-10-CM | POA: Diagnosis present

## 2013-05-26 DIAGNOSIS — Z91199 Patient's noncompliance with other medical treatment and regimen due to unspecified reason: Secondary | ICD-10-CM

## 2013-05-26 DIAGNOSIS — B1920 Unspecified viral hepatitis C without hepatic coma: Secondary | ICD-10-CM

## 2013-05-26 DIAGNOSIS — Z87442 Personal history of urinary calculi: Secondary | ICD-10-CM

## 2013-05-26 DIAGNOSIS — K920 Hematemesis: Principal | ICD-10-CM

## 2013-05-26 DIAGNOSIS — I10 Essential (primary) hypertension: Secondary | ICD-10-CM

## 2013-05-26 DIAGNOSIS — F1910 Other psychoactive substance abuse, uncomplicated: Secondary | ICD-10-CM

## 2013-05-26 DIAGNOSIS — K922 Gastrointestinal hemorrhage, unspecified: Secondary | ICD-10-CM

## 2013-05-26 DIAGNOSIS — Z23 Encounter for immunization: Secondary | ICD-10-CM

## 2013-05-26 MED ORDER — ONDANSETRON HCL 4 MG/2ML IJ SOLN
4.0000 mg | Freq: Once | INTRAMUSCULAR | Status: AC
  Administered 2013-05-27: 4 mg via INTRAVENOUS
  Filled 2013-05-26: qty 2

## 2013-05-26 MED ORDER — SODIUM CHLORIDE 0.9 % IV BOLUS (SEPSIS)
2000.0000 mL | Freq: Once | INTRAVENOUS | Status: AC
  Administered 2013-05-27: 2000 mL via INTRAVENOUS

## 2013-05-26 MED ORDER — SODIUM CHLORIDE 0.9 % IV SOLN: INTRAVENOUS | Status: DC

## 2013-05-26 MED ORDER — SODIUM CHLORIDE 0.9 % IV SOLN
80.0000 mg | Freq: Once | INTRAVENOUS | Status: AC
  Administered 2013-05-27: 80 mg via INTRAVENOUS
  Filled 2013-05-26: qty 80

## 2013-05-26 NOTE — ED Provider Notes (Signed)
CSN: [id number]     Arrival date & time 05/26/13  2234 History   None   Scribed for No att. providers found, the patient was seen in room A333/A333-01. This chart was scribed by Lewanda Rife, ED scribe. Patient's care was started at 9:10 PM  Chief Complaint  Patient presents with  . Emesis  . Generalized Body Aches  . Hematemesis   (Consider location/radiation/quality/duration/timing/severity/associated sxs/prior Treatment) The history is provided by the patient and medical records. No language interpreter was used.   HPI Comments: [name] is a 42 y.o. female who presents to the Emergency Department with PMHx of polysubstance abuse, and IV opiate drug abuse complaining of constant worsening generalized myalgias she attributes to opiate dependency onset tonight. States she usually feels: "this way when I don't shoot up". Reports associated 3-4 episodes hematemesis (started tonight), few episodes of non-bloody stools, and fatigue. Additionally, reports waxing and waning moderate chest pain all day today with several episodes lasting from seconds to minutes. Describes chest pain as stabbing and sharp sensation. Describes emesis as "dark blood". Denies any aggravating or alleviating factors. Denies associated fever, abdominal pain, nausea, emesis, shortness of breath, and no new rash. Denies taking any antacids PTA to relieve symptoms. Reports she an IV opana drug user and last time she used was yesterday (states she uses everyday). Reports she could not get any drugs today.        Past Medical History  Diagnosis Date  . Hypertension   . Depressed   . Drug addiction Hep C  . Hepatitis C     pt denies any treatment  . Gastroparesis   . Gastroesophageal reflux disease   . Polysubstance dependence including opioid type drug, episodic abuse   . Cancer   . Cervical cancer   . Renal disorder     kidney stones   Past Surgical History  Procedure Laterality Date  . Appendectomy     . Tubal ligation    . Mandible surgery for fracture    . Esophagogastroduodenoscopy  01/04/2008    Dr Teddy Spike reflux esophagitis, sm HH, gastritis, BRAVO positive for GERD on BID PPI   Family History  Problem Relation Age of Onset  . Colon cancer Neg Hx   . Liver disease Neg Hx    History  Substance Use Topics  . Smoking status: Current Every Day Smoker -- 1.00 packs/day for 15 years    Types: Cigarettes  . Smokeless tobacco: Not on file  . Alcohol Use: Yes   OB History   Grav Para Term Preterm Abortions TAB SAB Ect Mult Living                 Review of Systems  Gastrointestinal: Positive for vomiting. Negative for abdominal pain and blood in stool.  All other systems reviewed and are negative.   10 Systems reviewed and all are negative for acute change except as noted in the HPI.    Allergies  Tramadol hcl; Ketorolac tromethamine; and Propoxyphene napsylate  Home Medications   Current Outpatient Rx  Name  Route  Sig  Dispense  Refill  . aspirin EC 81 MG tablet   Oral   Take 81 mg by mouth daily.         . clonazePAM (KLONOPIN) 1 MG tablet   Oral   Take 1 mg by mouth 3 (three) times daily.         . cyclobenzaprine (FLEXERIL) 10 MG tablet   Oral  Take 10 mg by mouth 3 (three) times daily as needed for muscle spasms.         Marland Kitchen gabapentin (NEURONTIN) 300 MG capsule   Oral   Take 300 mg by mouth 2 (two) times daily.         Marland Kitchen lisinopril-hydrochlorothiazide (PRINZIDE,ZESTORETIC) 20-12.5 MG per tablet   Oral   Take 1 tablet by mouth daily.         . mupirocin cream (BACTROBAN) 2 %   Topical   Apply topically 3 (three) times daily.   15 g   0   . ranitidine (ZANTAC) 150 MG tablet   Oral   Take 150 mg by mouth 2 (two) times daily.         . pantoprazole (PROTONIX) 40 MG tablet   Oral   Take 1 tablet (40 mg total) by mouth 2 (two) times daily.   30 tablet   0    BP 142/93  Pulse 58  Temp(Src) 97.5 F (36.4 C) (Oral)  Resp 20   Ht 5\' 2"  (1.575 m)  Wt 176 lb 8 oz (80.06 kg)  BMI 32.27 kg/m2  SpO2 100%  LMP 05/09/2013 Physical Exam  Nursing note and vitals reviewed. Constitutional:  Awake, alert, nontoxic appearance.  HENT:  Head: Atraumatic.  Eyes: Right eye exhibits no discharge. Left eye exhibits no discharge.  Neck: Neck supple.  Cardiovascular: Regular rhythm.  Tachycardia present.   No murmur heard. Pulmonary/Chest: Effort normal. She exhibits tenderness.  Mild anterior TTP of chest wall   Abdominal: Soft. Bowel sounds are normal. There is tenderness. There is no rebound.  Mild epigastric tenderness and rest of abdomen is non-tender   Genitourinary: Guaiac negative stool.  Chaperone was present during rectal exam.   Musculoskeletal: She exhibits no tenderness.  Baseline ROM, no obvious new focal weakness.  Neurological:  Mental status and motor strength appears baseline for patient and situation.  Psychiatric: She has a normal mood and affect.    ED Course  Procedures (including critical care time) COORDINATION OF CARE:  Nursing notes reviewed. Vital signs reviewed. Initial pt interview and examination performed.   9:10 PM-Discussed work up plan with pt at bedside, which includes CBC with diff panel, CMP, pro-time INR, Lipase, troponin, UA, urine pregnancy, UDS, and ethanol. Pt agrees with plan.  Labs and imaging pending; care endorsed to Dr. [name]; Dispo pending; anticipate admit. 0015  Treatment plan initiated: Medications  ondansetron (ZOFRAN) injection 4 mg (4 mg Intravenous Given 05/27/13 0033)  pantoprazole (PROTONIX) 80 mg in sodium chloride 0.9 % 100 mL IVPB (0 mg Intravenous Stopped 05/27/13 0124)  sodium chloride 0.9 % bolus 2,000 mL (0 mLs Intravenous Stopped 05/27/13 0209)  pneumococcal 23 valent vaccine (PNU-IMMUNE) injection 0.5 mL (0.5 mLs Intramuscular Given 05/28/13 0921)  influenza vac split quadrivalent PF (FLUARIX) injection 0.5 mL (0.5 mLs Intramuscular Given 05/28/13  0922)  oxyCODONE-acetaminophen (PERCOCET/ROXICET) 5-325 MG per tablet 1 tablet (1 tablet Oral Given 05/28/13 1038)     Initial diagnostic testing ordered.    Labs Review Labs Reviewed  CBC WITH DIFFERENTIAL - Abnormal; Notable for the following:    WBC 10.8 (*)    Hemoglobin 11.9 (*)    HCT 35.4 (*)    All other components within normal limits  COMPREHENSIVE METABOLIC PANEL - Abnormal; Notable for the following:    Glucose, Bld 117 (*)    ALT 44 (*)    All other components within normal limits  URINALYSIS, ROUTINE W REFLEX MICROSCOPIC -  Abnormal; Notable for the following:    Leukocytes, UA SMALL (*)    All other components within normal limits  URINE RAPID DRUG SCREEN (HOSP PERFORMED) - Abnormal; Notable for the following:    Cocaine POSITIVE (*)    Benzodiazepines POSITIVE (*)    Tetrahydrocannabinol POSITIVE (*)    All other components within normal limits  URINE MICROSCOPIC-ADD ON - Abnormal; Notable for the following:    Squamous Epithelial / LPF MANY (*)    Bacteria, UA FEW (*)    All other components within normal limits  CBC - Abnormal; Notable for the following:    RBC 3.72 (*)    Hemoglobin 11.1 (*)    HCT 33.6 (*)    All other components within normal limits  CBC - Abnormal; Notable for the following:    Hemoglobin 11.8 (*)    HCT 35.3 (*)    All other components within normal limits  COMPREHENSIVE METABOLIC PANEL - Abnormal; Notable for the following:    Glucose, Bld 112 (*)    Calcium 8.1 (*)    Albumin 3.0 (*)    ALT 37 (*)    Total Bilirubin 0.2 (*)    All other components within normal limits  HCV RNA QUANT RFLX ULTRA OR GENOTYP - Abnormal; Notable for the following:    HCV Quantitative [id number] (*)    HCV Quantitative Log 6.86 (*)    All other components within normal limits  BASIC METABOLIC PANEL - Abnormal; Notable for the following:    Glucose, Bld 100 (*)    All other components within normal limits  URINE CULTURE  MRSA PCR SCREENING   PROTIME-INR  LIPASE, BLOOD  TROPONIN I  PREGNANCY, URINE  ETHANOL  CBC  HEPATITIS C GENOTYPE  TYPE AND SCREEN   Imaging Review No results found.  EKG Interpretation     Ventricular Rate:  91 PR Interval:  130 QRS Duration: 88 QT Interval:  374 QTC Calculation: 460 R Axis:   60 Text Interpretation:  Normal sinus rhythm Normal ECG When compared with ECG of 24-Nov-2012 22:05, No significant change was found            MDM   1. UGI bleed   2. Polysubstance abuse   3. Hepatitis C   4. Hematemesis   5. Polysubstance dependence including opioid type drug, episodic abuse   6. Unspecified essential hypertension    Dispo pending. I personally performed the services described in this documentation, which was scribed in my presence. The recorded information has been reviewed and is accurate.    Hurman Horn, MD 06/01/13 2111

## 2013-05-26 NOTE — ED Notes (Signed)
Patient states that she is having body aches and is vomiting blood

## 2013-05-27 ENCOUNTER — Encounter (HOSPITAL_COMMUNITY): Payer: Self-pay | Admitting: Emergency Medicine

## 2013-05-27 DIAGNOSIS — I10 Essential (primary) hypertension: Secondary | ICD-10-CM

## 2013-05-27 DIAGNOSIS — K219 Gastro-esophageal reflux disease without esophagitis: Secondary | ICD-10-CM

## 2013-05-27 DIAGNOSIS — F1910 Other psychoactive substance abuse, uncomplicated: Secondary | ICD-10-CM

## 2013-05-27 DIAGNOSIS — F1120 Opioid dependence, uncomplicated: Secondary | ICD-10-CM

## 2013-05-27 DIAGNOSIS — K920 Hematemesis: Secondary | ICD-10-CM

## 2013-05-27 DIAGNOSIS — K922 Gastrointestinal hemorrhage, unspecified: Secondary | ICD-10-CM

## 2013-05-27 LAB — RAPID URINE DRUG SCREEN, HOSP PERFORMED
Amphetamines: NOT DETECTED
Barbiturates: NOT DETECTED
Tetrahydrocannabinol: POSITIVE — AB

## 2013-05-27 LAB — URINALYSIS, ROUTINE W REFLEX MICROSCOPIC
Bilirubin Urine: NEGATIVE
Hgb urine dipstick: NEGATIVE
Protein, ur: NEGATIVE mg/dL
Urobilinogen, UA: 0.2 mg/dL (ref 0.0–1.0)

## 2013-05-27 LAB — CBC
HCT: 33.6 % — ABNORMAL LOW (ref 36.0–46.0)
Hemoglobin: 11.8 g/dL — ABNORMAL LOW (ref 12.0–15.0)
Platelets: 251 10*3/uL (ref 150–400)
Platelets: 271 10*3/uL (ref 150–400)
RBC: 3.72 MIL/uL — ABNORMAL LOW (ref 3.87–5.11)
RBC: 3.92 MIL/uL (ref 3.87–5.11)
RDW: 13.7 % (ref 11.5–15.5)
WBC: 8.8 10*3/uL (ref 4.0–10.5)
WBC: 8.5 10*3/uL (ref 4.0–10.5)

## 2013-05-27 LAB — COMPREHENSIVE METABOLIC PANEL
ALT: 37 U/L — ABNORMAL HIGH (ref 0–35)
AST: 30 U/L (ref 0–37)
Albumin: 3.5 g/dL (ref 3.5–5.2)
Alkaline Phosphatase: 57 U/L (ref 39–117)
CO2: 22 mEq/L (ref 19–32)
Chloride: 101 mEq/L (ref 96–112)
Chloride: 109 mEq/L (ref 96–112)
Creatinine, Ser: 0.62 mg/dL (ref 0.50–1.10)
GFR calc Af Amer: >90 mL/min (ref >90)
GFR calc non Af Amer: >90 mL/min (ref >90)
Glucose, Bld: 112 mg/dL — ABNORMAL HIGH (ref 70–99)
Potassium: 3.7 mEq/L (ref 3.5–5.1)
Potassium: 4 mEq/L (ref 3.5–5.1)
Sodium: 137 mEq/L (ref 135–145)
Sodium: 139 mEq/L (ref 135–145)
Total Bilirubin: 0.2 mg/dL — ABNORMAL LOW (ref 0.3–1.2)
Total Bilirubin: 0.3 mg/dL (ref 0.3–1.2)

## 2013-05-27 LAB — URINE MICROSCOPIC-ADD ON

## 2013-05-27 LAB — CBC WITH DIFFERENTIAL/PLATELET
Basophils Absolute: 0 10*3/uL (ref 0.0–0.1)
Basophils Relative: 0 % (ref 0–1)
MCHC: 33.6 g/dL (ref 30.0–36.0)
Monocytes Absolute: 0.7 10*3/uL (ref 0.1–1.0)
Neutro Abs: 6 10*3/uL (ref 1.7–7.7)
Neutrophils Relative %: 56 % (ref 43–77)
Platelets: 284 10*3/uL (ref 150–400)
RDW: 13.6 % (ref 11.5–15.5)

## 2013-05-27 LAB — TYPE AND SCREEN
ABO/RH(D): A POS
Antibody Screen: NEGATIVE

## 2013-05-27 LAB — PROTIME-INR: INR: 0.96 (ref 0.00–1.49)

## 2013-05-27 LAB — ETHANOL: Alcohol, Ethyl (B): <11 mg/dL (ref 0–11)

## 2013-05-27 MED ORDER — CLONAZEPAM 0.5 MG PO TABS
1.0000 mg | ORAL_TABLET | Freq: Three times a day (TID) | ORAL | Status: DC | PRN
Start: 2013-05-27 — End: 2013-05-28
  Administered 2013-05-27 – 2013-05-28 (×3): 1 mg via ORAL
  Filled 2013-05-27 (×3): qty 2

## 2013-05-27 MED ORDER — ACETAMINOPHEN 650 MG RE SUPP: 650.0000 mg | Freq: Four times a day (QID) | RECTAL | Status: DC | PRN

## 2013-05-27 MED ORDER — SODIUM CHLORIDE 0.9 % IV SOLN
INTRAVENOUS | Status: DC
  Administered 2013-05-27 – 2013-05-28 (×2): via INTRAVENOUS

## 2013-05-27 MED ORDER — SODIUM CHLORIDE 0.9 % IJ SOLN
3.0000 mL | Freq: Two times a day (BID) | INTRAMUSCULAR | Status: DC
  Administered 2013-05-27 – 2013-05-28 (×2): 3 mL via INTRAVENOUS

## 2013-05-27 MED ORDER — ONDANSETRON HCL 4 MG PO TABS: 4.0000 mg | ORAL_TABLET | Freq: Four times a day (QID) | ORAL | Status: DC | PRN

## 2013-05-27 MED ORDER — PANTOPRAZOLE SODIUM 40 MG IV SOLR
40.0000 mg | Freq: Two times a day (BID) | INTRAVENOUS | Status: DC
  Administered 2013-05-27 – 2013-05-28 (×2): 40 mg via INTRAVENOUS
  Filled 2013-05-27 (×2): qty 40

## 2013-05-27 MED ORDER — ONDANSETRON HCL 4 MG/2ML IJ SOLN
4.0000 mg | Freq: Four times a day (QID) | INTRAMUSCULAR | Status: DC | PRN
  Administered 2013-05-27 (×2): 4 mg via INTRAVENOUS
  Filled 2013-05-27 (×3): qty 2

## 2013-05-27 MED ORDER — PANTOPRAZOLE SODIUM 40 MG IV SOLR
INTRAVENOUS | Status: AC
  Filled 2013-05-27: qty 80

## 2013-05-27 MED ORDER — INFLUENZA VAC SPLIT QUAD 0.5 ML IM SUSP
0.5000 mL | INTRAMUSCULAR | Status: AC
  Administered 2013-05-28: 0.5 mL via INTRAMUSCULAR
  Filled 2013-05-27: qty 0.5

## 2013-05-27 MED ORDER — ACETAMINOPHEN 325 MG PO TABS
650.0000 mg | ORAL_TABLET | Freq: Four times a day (QID) | ORAL | Status: DC | PRN
  Administered 2013-05-27 – 2013-05-28 (×3): 650 mg via ORAL
  Filled 2013-05-27 (×3): qty 2

## 2013-05-27 MED ORDER — PNEUMOCOCCAL VAC POLYVALENT 25 MCG/0.5ML IJ INJ
0.5000 mL | INJECTION | INTRAMUSCULAR | Status: AC
  Administered 2013-05-28: 0.5 mL via INTRAMUSCULAR
  Filled 2013-05-27: qty 0.5

## 2013-05-27 MED ORDER — CLONIDINE HCL 0.1 MG PO TABS
0.1000 mg | ORAL_TABLET | Freq: Two times a day (BID) | ORAL | Status: DC
  Administered 2013-05-27 – 2013-05-28 (×3): 0.1 mg via ORAL
  Filled 2013-05-27 (×3): qty 1

## 2013-05-27 NOTE — BH Assessment (Signed)
Writer made a 2nd attempt to page the physician (Dr. Jomarie Longs) @ [phone] caring for patient prior to seeing patient.
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
## 2013-05-27 NOTE — ED Provider Notes (Signed)
Lab work overall reassuring. Admitted to observation on the internal medicine service.  [name]. [name], MD 05/27/13 [phone]

## 2013-05-27 NOTE — BH Assessment (Signed)
Writer ready to see patient. Waiting on patient's nurse to place machine in patient's room.

## 2013-05-27 NOTE — Progress Notes (Signed)
Discussed case with physician advisor and he recommended inpatient status as patient is NPO & IVF as well as Protonix.  Discussed with Dr Conan Bowens who agreed and order for inpatient written

## 2013-05-27 NOTE — Consult Note (Signed)
Referring Provider: Zannie Cove, MD Primary Care Physician:  Alleen Borne Primary Gastroenterologist:  Jonette Eva, MD   Reason for Consultation:  Hematemesis  HPI: [name] is a 42 y.o. female with PMH significant for HTN, opioid dependence, hemolytic anemia who presents with four episodes of bloody emesis.  Last seen in 08/2012 during hospitalization. H/O hemolytic anemia secondary to gram-negative bacteremia at that time. She also had a large left psoas abscess (MRSA) requiring IV vanc 07/2012.  She no showed her last two appointments with [place] in 08/2012 and 10/2012.   She presented with complaints of hematemesis. States she has been having a lot of heartburn. Last night she felt something come up in back of throat. Second time, spit up in paper towel, fresh blood. Then started vomiting, couple of cups, dark red blood. No melena. BM regular. Stools normal in color. Some epigastric pain this morning. Last episode of emesis, early this morning, still with blood in emesis but less. Some recent odynophagia/dysphagia. Zantac not working. Goody's powders about three times per week.   States yesterday she used cocaine and marijuana, first time in a long time. She has h/o of IV Opana use and was able to use yesterday. Also takes Lortab TID.   Prior to Admission medications   Medication Sig Start Date End Date Taking? Authorizing Provider  Goody's powders About three per week.      clonazePAM (KLONOPIN) 1 MG tablet Take 1 mg by mouth 3 (three) times daily.   Yes Historical Provider, MD  cyclobenzaprine (FLEXERIL) 10 MG tablet Take 10 mg by mouth 3 (three) times daily as needed for muscle spasms.   Yes Historical Provider, MD  gabapentin (NEURONTIN) 300 MG capsule Take 300 mg by mouth 2 (two) times daily.   Yes Historical Provider, MD  lisinopril-hydrochlorothiazide (PRINZIDE,ZESTORETIC) 20-12.5 MG per tablet Take 1 tablet by mouth daily.   Yes Historical Provider, MD           ranitidine (ZANTAC) 150 MG tablet Take 150 mg by mouth 2 (two) times daily.   Yes Historical Provider, MD  Lortab 7.5 Three per day. Has been on for one year.  IV Opana  "All Day"  Current Facility-Administered Medications  Medication Dose Route Frequency Provider Last Rate Last Dose  . 0.9 %  sodium chloride infusion   Intravenous Continuous Osvaldo Shipper, MD 100 mL/hr at 05/27/13 0316    . acetaminophen (TYLENOL) tablet 650 mg  650 mg Oral Q6H PRN Osvaldo Shipper, MD       Or  . acetaminophen (TYLENOL) suppository 650 mg  650 mg Rectal Q6H PRN Osvaldo Shipper, MD      . Melene Muller ON 05/28/2013] influenza vac split quadrivalent PF (FLUARIX) injection 0.5 mL  0.5 mL Intramuscular Tomorrow-1000 Osvaldo Shipper, MD      . ondansetron (ZOFRAN) tablet 4 mg  4 mg Oral Q6H PRN Osvaldo Shipper, MD       Or  . ondansetron (ZOFRAN) injection 4 mg  4 mg Intravenous Q6H PRN Osvaldo Shipper, MD   4 mg at 05/27/13 4098  . pantoprazole (PROTONIX) injection 40 mg  40 mg Intravenous Q12H Osvaldo Shipper, MD      . Melene Muller ON 05/28/2013] pneumococcal 23 valent vaccine (PNU-IMMUNE) injection 0.5 mL  0.5 mL Intramuscular Tomorrow-1000 Osvaldo Shipper, MD      . sodium chloride 0.9 % injection 3 mL  3 mL Intravenous Q12H Osvaldo Shipper, MD   3 mL at 05/27/13 0316    Allergies as  of 05/26/2013 - Review Complete 05/26/2013  Allergen Reaction Noted  . Tramadol hcl Anaphylaxis and Swelling   . Ketorolac tromethamine Nausea And Vomiting   . Propoxyphene napsylate Rash 08/12/2007    Past Medical History  Diagnosis Date  . Hypertension   . Depressed   . Drug addiction Hep C  . Hepatitis C     pt denies any treatment  . Gastroparesis   . Gastroesophageal reflux disease   . Polysubstance dependence including opioid type drug, episodic abuse   . Cancer   . Cervical cancer   . Renal disorder     kidney stones    Past Surgical History  Procedure Laterality Date  . Appendectomy    . Tubal ligation    . Mandible  surgery for fracture    . Esophagogastroduodenoscopy  01/04/2008    Dr Teddy Spike reflux esophagitis, sm HH, gastritis, BRAVO positive for GERD on BID PPI    Family History  Problem Relation Age of Onset  . Colon cancer Neg Hx   . Liver disease Neg Hx     History   Social History  . Marital Status: Divorced    Spouse Name: N/A    Number of Children: 2  . Years of Education: N/A   Occupational History  . disabled    Social History Main Topics  . Smoking status: Current Every Day Smoker -- 1.00 packs/day for 15 years    Types: Cigarettes  . Smokeless tobacco: Not on file  . Alcohol Use: Yes  . Drug Use: Yes    Special: Cocaine, IV, "Crack" cocaine, Marijuana     Comment: cocaine, crack, marijuana and OPANA IV-last used 05/26/13  . Sexual Activity: Yes    Birth Control/ Protection: None, Surgical   Other Topics Concern  . Not on file   Social History Narrative   Lives w/ mother & 2 children (17 &13)     ROS:  General: Negative for anorexia, weight loss, fever, chills, fatigue, weakness. Eyes: Negative for vision changes.  ENT: Negative for hoarseness, difficulty swallowing , nasal congestion. CV: Negative for chest pain, angina, palpitations, dyspnea on exertion, peripheral edema.  Respiratory: Negative for dyspnea at rest, dyspnea on exertion, cough, sputum, wheezing.  GI: See history of present illness. GU:  Negative for dysuria, hematuria, urinary incontinence, urinary frequency, nocturnal urination.  MS: Negative for joint pain, low back pain.  Derm: Negative for rash or itching. +psoriasis Neuro: Negative for weakness, abnormal sensation, seizure, frequent headaches, memory loss, confusion.  Psych: Negative for anxiety, depression, suicidal ideation, hallucinations.  Endo: Negative for unusual weight change.  Heme: Negative for bruising or bleeding. Allergy: Negative for rash or hives.       Physical Examination: Vital signs in last 24 hours: Temp:   [97.8 F (36.6 C)-98.2 F (36.8 C)] 98.2 F (36.8 C) (11/06 0503) Pulse Rate:  [83-101] 83 (11/06 0503) Resp:  [20-24] 20 (11/06 0503) BP: (138-157)/(93-104) 150/98 mmHg (11/06 0503) SpO2:  [97 %-100 %] 100 % (11/06 0503) Weight:  [170 lb (77.111 kg)-176 lb 8 oz (80.06 kg)] 176 lb 8 oz (80.06 kg) (11/06 0244) Last BM Date: 05/25/13  General: Well-nourished, well-developed in no acute distress. Drowsy. Head: Normocephalic, atraumatic.   Eyes: Conjunctiva pink, no icterus. Mouth: Oropharyngeal mucosa moist and pink , no lesions erythema or exudate. Neck: Supple without thyromegaly, masses, or lymphadenopathy.  Lungs: Clear to auscultation bilaterally.  Heart: Regular rate and rhythm, no murmurs rubs or gallops.  Abdomen: Bowel sounds are  normal, nontender, nondistended, no hepatosplenomegaly or masses, no abdominal bruits or    hernia , no rebound or guarding.   Rectal: not performed Extremities: No lower extremity edema, clubbing, deformity.  Neuro: Alert and oriented x 4 , grossly normal neurologically.  Skin: Warm and dry, no jaundice.  Rash noted on all four extremities c/w psoriasis. Psych: Alert and cooperative, normal mood and affect.        Intake/Output from previous day:   Intake/Output this shift:    Lab Results: CBC  Recent Labs  05/27/13 0002 05/27/13 0311 05/27/13 0911  WBC 10.8* 8.8 8.5  HGB 11.9* 11.1* 11.8*  HCT 35.4* 33.6* 35.3*  MCV 89.8 90.3 90.1  PLT 284 251 271   BMET  Recent Labs  05/27/13 0002 05/27/13 0311  NA 137 139  K 3.7 4.0  CL 101 109  CO2 24 22  GLUCOSE 117* 112*  BUN 15 11  CREATININE 0.62 0.59  CALCIUM 9.2 8.1*   LFT  Recent Labs  05/27/13 0002 05/27/13 0311  BILITOT 0.3 0.2*  ALKPHOS 54 57  AST 30 26  ALT 44* 37*  PROT 7.8 6.6  ALBUMIN 3.5 3.0*   Lab Results  Component Value Date   LIPASE 18 05/27/2013    PT/INR  Recent Labs  05/27/13 0002  LABPROT 12.6  INR 0.96    UDS: Positive for cocaine, benzos,  THC  Imaging Studies: Dg Chest Port 1 View  05/27/2013   CLINICAL DATA:  42-year- female with chest pain. Hepatitis. Initial encounter.  EXAM: PORTABLE CHEST - 1 VIEW  COMPARISON:  11/24/2012 and earlier.  FINDINGS: Portable AP upright view at 0006 hrs. Stable lung volumes. Normal cardiac size and mediastinal contours. Visualized tracheal air column is within normal limits. No pneumothorax. Allowing for portable technique, the lungs are clear.  IMPRESSION: No acute cardiopulmonary abnormality.   Electronically Signed   By: Augusto Gamble M.D.   On: 05/27/2013 00:18   Dg Hand Complete Left  05/14/2013   CLINICAL DATA:  Left hand and thumb pain, fell down stairs  EXAM: LEFT HAND - COMPLETE 3+ VIEW  COMPARISON:  Prior radiographs of the left hand 06/09/2012  FINDINGS: There is no evidence of fracture or dislocation. There is no evidence of arthropathy or other focal bone abnormality. Soft tissues are unremarkable.  IMPRESSION: Negative.   Electronically Signed   By: Malachy Moan M.D.   On: 05/14/2013 12:43   Dg Finger Thumb Right  05/14/2013   CLINICAL DATA:  Left hand and thumb pain, fell down stairs  EXAM: RIGHT THUMB 2+V  COMPARISON:  Concurrently obtained radiographs of the hand  FINDINGS: There is no evidence of fracture or dislocation. There is no evidence of arthropathy or other focal bone abnormality. Soft tissues are unremarkable  IMPRESSION: Negative.   Electronically Signed   By: Malachy Moan M.D.   On: 05/14/2013 12:43  [4 week]   Impression: 42 y/o female with h/o polysubstance abuse who presents with hematemesis and chronic GERD. No substantial change in H/H since admission. No evidence of recurrent hemolytic anemia at this time. She takes Goody's powder several times per week. Suspect erosive/ulcerative reflux esophagitis, PUD, gastritis. Doubt significant upper GI bleed at this time. She is hemodynamically stable. Given her polysubstance abuse, she is likely not a candidate for  conscious sedation. She would need deep sedation in the OR. We will have to discuss with anesthesiology given her positive drug screen for cocaine.  Plan: 1. Clear liquids.  2. IV PPI BID. 3. Further recommendations to follow.   LOS: 1 day   Tana Coast  05/27/2013, 9:25 AM  Attending note:  Patient has a significant history of noncompliance with our practice. Agree with eventual need for EGD. She's not a candidate for anesthesia at this time because of a positive drug screen (cocaine).  She does not appear to have a life-threatening bleed.  Recommend we hold off on an EGD for at least one week. She would need a drug screen and pre-anesthesia visit in about a week prior to  proceeding (discuss with Dr. Jayme Cloud) .   Will need deep sedation. Agree with avoiding nonsteroidals and continuing twice a day PPI therapy for now.

## 2013-05-27 NOTE — BH Assessment (Addendum)
Pt assessed by [facility]. She was offered but refused services. Writer encouraged patient to attend detox at [facility] as per notes from SW beds are available. Pt again declined. Pt requesting to discharge and to follow up with her own provider and would like to make that appointment herself after she is discharged. Per Lucianne Muss, all patient's discharged will need to be evaluated by a extender. Verne Spurr, PA notified and asked to consult on this patient.

## 2013-05-27 NOTE — Progress Notes (Signed)
Pt seen and examined, admitted this am Suspect Mild hematemesis related to gastritis vs Mallory weiss tear  Hb stable, better than recent baseline Continue IV PPI, advance diet CSW consult for Drug rehab/Opiod dependence, start low dose clonidine, no over withdrawals yet.  Zannie Cove, MD [phone]

## 2013-05-27 NOTE — BH Assessment (Signed)
Received a call from staff at [facility] requesting a TA for this patient. Writer called nurse to inform her the this writer was ready to start the TA and asked that machine is placed in patient's room. Writer also requested the MD's # to call for clinicals prior to seeing this patient. Writer was given Dr. Edison Simon number which is [phone]. Writer paged the physician; awaiting a call back to discuss clinicals.

## 2013-05-27 NOTE — H&P (Signed)
[facility] History and Physical  EMARIE PAUL ZOX:[id number] DOB: April 02, 1971 DOA: 05/26/2013   PCP: Alleen Borne  Specialists: None  Chief Complaint: Vomiting up blood  HPI: ANNIEBELLE DEVORE is a 42 y.o. female with a past medical history of hypertension, opioid dependence, who was hospitalized back in February for her hemolytic anemia. She presented today complaining of 4 episodes of bloody emesis. Patient has a history of IV narcotic use. Apparently, she couldn't use today. And, then she started having nausea, which she attributed to withdrawal. And, then she saw purplish material in her emesis. Denies any abdominal pain. Patient is actually quite somnolent though easily arousable. But she wouldn't stay awake enough for her to answer my all my questions. So history is very limited.  Home Medications: Prior to Admission medications   Medication Sig Start Date End Date Taking? Authorizing Provider  aspirin EC 81 MG tablet Take 81 mg by mouth daily.   Yes Historical Provider, MD  clonazePAM (KLONOPIN) 1 MG tablet Take 1 mg by mouth 3 (three) times daily.   Yes Historical Provider, MD  cyclobenzaprine (FLEXERIL) 10 MG tablet Take 10 mg by mouth 3 (three) times daily as needed for muscle spasms.   Yes Historical Provider, MD  gabapentin (NEURONTIN) 300 MG capsule Take 300 mg by mouth 2 (two) times daily.   Yes Historical Provider, MD  lisinopril-hydrochlorothiazide (PRINZIDE,ZESTORETIC) 20-12.5 MG per tablet Take 1 tablet by mouth daily.   Yes Historical Provider, MD  mupirocin cream (BACTROBAN) 2 % Apply topically 3 (three) times daily. 05/14/13  Yes Kathie Dike, PA-C  ranitidine (ZANTAC) 150 MG tablet Take 150 mg by mouth 2 (two) times daily.   Yes Historical Provider, MD    Allergies:  Allergies  Allergen Reactions  . Tramadol Hcl Anaphylaxis and Swelling    Of tongue  . Ketorolac Tromethamine Nausea And Vomiting  . Propoxyphene Napsylate Rash    Past  Medical History: Past Medical History  Diagnosis Date  . Hypertension   . Depressed   . Drug addiction Hep C  . Hepatitis C     pt denies any treatment  . Gastroparesis   . Gastroesophageal reflux disease   . Polysubstance dependence including opioid type drug, episodic abuse   . Cancer   . Cervical cancer   . Renal disorder     kidney stones    Past Surgical History  Procedure Laterality Date  . Appendectomy    . Tubal ligation    . Mandible surgery for fracture    . Esophagogastroduodenoscopy  01/04/2008    Dr Teddy Spike reflux esophagitis, sm HH, gastritis, BRAVO positive for GERD on BID PPI    Social History: Unknown   Family History: unable to obtain due to excessive somnolence.  Review of Systems - unable to obtain as the patient is very somnolent  Physical Examination  Filed Vitals:   05/26/13 2236 05/27/13 0125  BP: 144/104 157/97  Pulse: 101 94  Temp: 97.8 F (36.6 C)   TempSrc: Oral   Resp: 20 24  Height: 5\' 2"  (1.575 m)   Weight: 77.111 kg (170 lb)   SpO2: 99% 98%    General appearance: appears stated age, distracted, no distress, moderately obese and slowed mentation Head: Normocephalic, without obvious abnormality, atraumatic Neck: no adenopathy, no carotid bruit, no JVD, supple, symmetrical, trachea midline and thyroid not enlarged, symmetric, no tenderness/mass/nodules Resp: clear to auscultation bilaterally Cardio: regular rate and rhythm, S1, S2 normal, no murmur, click, rub or  gallop GI: soft, non-tender; bowel sounds normal; no masses,  no organomegaly Extremities: extremities normal, atraumatic, no cyanosis or edema Pulses: 2+ and symmetric Skin: There is a maculopapular rash which is scaly. This is present diffusely all over her skin but more so in the lower extremities. Lymph nodes: Cervical, supraclavicular, and axillary nodes normal. Neurologic: She is somnolent, but easily arousable. No focal deficits are present.  Laboratory  Data: Results for orders placed during the hospital encounter of 05/26/13 (from the past 48 hour(s))  CBC WITH DIFFERENTIAL     Status: Abnormal   Collection Time    05/27/13 12:02 AM      Result Value Range   WBC 10.8 (*) 4.0 - 10.5 K/uL   RBC 3.94  3.87 - 5.11 MIL/uL   Hemoglobin 11.9 (*) 12.0 - 15.0 g/dL   HCT 28.4 (*) 13.2 - 44.0 %   MCV 89.8  78.0 - 100.0 fL   MCH 30.2  26.0 - 34.0 pg   MCHC 33.6  30.0 - 36.0 g/dL   RDW 10.2  72.5 - 36.6 %   Platelets 284  150 - 400 K/uL   Neutrophils Relative % 56  43 - 77 %   Neutro Abs 6.0  1.7 - 7.7 K/uL   Lymphocytes Relative 35  12 - 46 %   Lymphs Abs 3.8  0.7 - 4.0 K/uL   Monocytes Relative 6  3 - 12 %   Monocytes Absolute 0.7  0.1 - 1.0 K/uL   Eosinophils Relative 3  0 - 5 %   Eosinophils Absolute 0.3  0.0 - 0.7 K/uL   Basophils Relative 0  0 - 1 %   Basophils Absolute 0.0  0.0 - 0.1 K/uL  COMPREHENSIVE METABOLIC PANEL     Status: Abnormal   Collection Time    05/27/13 12:02 AM      Result Value Range   Sodium 137  135 - 145 mEq/L   Potassium 3.7  3.5 - 5.1 mEq/L   Chloride 101  96 - 112 mEq/L   CO2 24  19 - 32 mEq/L   Glucose, Bld 117 (*) 70 - 99 mg/dL   BUN 15  6 - 23 mg/dL   Creatinine, Ser 4.40  0.50 - 1.10 mg/dL   Calcium 9.2  8.4 - 34.7 mg/dL   Total Protein 7.8  6.0 - 8.3 g/dL   Albumin 3.5  3.5 - 5.2 g/dL   AST 30  0 - 37 U/L   ALT 44 (*) 0 - 35 U/L   Alkaline Phosphatase 54  39 - 117 U/L   Total Bilirubin 0.3  0.3 - 1.2 mg/dL   GFR calc non Af Amer >90  >90 mL/min   GFR calc Af Amer >90  >90 mL/min   Comment: (NOTE)     The eGFR has been calculated using the CKD EPI equation.     This calculation has not been validated in all clinical situations.     eGFR's persistently <90 mL/min signify possible Chronic Kidney     Disease.  PROTIME-INR     Status: None   Collection Time    05/27/13 12:02 AM      Result Value Range   Prothrombin Time 12.6  11.6 - 15.2 seconds   INR 0.96  0.00 - 1.49  LIPASE, BLOOD      Status: None   Collection Time    05/27/13 12:02 AM      Result Value Range  Lipase 18  11 - 59 U/L  TROPONIN I     Status: None   Collection Time    05/27/13 12:02 AM      Result Value Range   Troponin I <0.30  <0.30 ng/mL   Comment:            Due to the release kinetics of cTnI,     a negative result within the first hours     of the onset of symptoms does not rule out     myocardial infarction with certainty.     If myocardial infarction is still suspected,     repeat the test at appropriate intervals.  ETHANOL     Status: None   Collection Time    05/27/13 12:02 AM      Result Value Range   Alcohol, Ethyl (B) <11  0 - 11 mg/dL   Comment:            LOWEST DETECTABLE LIMIT FOR     SERUM ALCOHOL IS 11 mg/dL     FOR MEDICAL PURPOSES ONLY  TYPE AND SCREEN     Status: None   Collection Time    05/27/13 12:07 AM      Result Value Range   ABO/RH(D) A POS     Antibody Screen NEG     Sample Expiration 05/30/2013      Radiology Reports: Dg Chest Port 1 View  05/27/2013   CLINICAL DATA:  42-year- female with chest pain. Hepatitis. Initial encounter.  EXAM: PORTABLE CHEST - 1 VIEW  COMPARISON:  11/24/2012 and earlier.  FINDINGS: Portable AP upright view at 0006 hrs. Stable lung volumes. Normal cardiac size and mediastinal contours. Visualized tracheal air column is within normal limits. No pneumothorax. Allowing for portable technique, the lungs are clear.  IMPRESSION: No acute cardiopulmonary abnormality.   Electronically Signed   By: Augusto Gamble M.D.   On: 05/27/2013 00:18    Electrocardiogram: Sinus rhythm and 91 beats per minute. Normal axis. Intervals are normal. No Q waves. No concerning ST or T-wave changes are identified.  Problem List  Principal Problem:   Hematemesis Active Problems:   PSORIASIS   Polysubstance dependence including opioid type drug, episodic abuse   Assessment: This is a 42 year old, Caucasian female, who has opioid dependence. She couldn't get  any narcotics and went into withdrawal. She had a few episodes of emesis, which had purplish material. She thought it was.blood. And she decided to come in to the hospital.  Plan: #1 possible hematemesis: Could be Mallory-Weiss. It is difficult to obtain history from this patient at this time as she is sleepy and would not stay awake to answer my questions. We will check CBCs every 6hrs. We'll give her PPI. Go ahead and consult GI for now.  #2 opioid dependence: Currently she is not manifesting any withdrawal symptoms. Consult Child psychotherapist. May have to utilize clonidine if she exhibits symptoms.  #3 recent history of Coombs negative hemolytic anemia, secondary to gram-negative bacteremia: Appears to have stabilized. Continue to monitor hemoglobin.  #4 recent left psoas MRSA abscess: Appears to have resolved  DVT Prophylaxis: SCDs Code Status: Full code Family Communication: No family at bedside  Disposition Plan: Observe on telemetry   Further management decisions will depend on results of further testing and patient's response to treatment.  [facility]  [facility] Pager [phone]  If 7PM-7AM, please contact night-coverage [email or web address] Password [email or web address]  05/27/2013, 1:35 AM
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
## 2013-05-27 NOTE — BH Assessment (Signed)
Dr. Jomarie Longs returned call and clinicals were obtained prior to seeing patient.

## 2013-05-28 ENCOUNTER — Other Ambulatory Visit: Payer: Self-pay | Admitting: Gastroenterology

## 2013-05-28 ENCOUNTER — Telehealth: Payer: Self-pay | Admitting: Gastroenterology

## 2013-05-28 LAB — URINE CULTURE: Culture: NO GROWTH

## 2013-05-28 LAB — CBC
HCT: 37.1 % (ref 36.0–46.0)
MCH: 29.7 pg (ref 26.0–34.0)
MCHC: 32.9 g/dL (ref 30.0–36.0)
MCV: 90.3 fL (ref 78.0–100.0)
Platelets: 258 10*3/uL (ref 150–400)
RDW: 13.5 % (ref 11.5–15.5)

## 2013-05-28 LAB — BASIC METABOLIC PANEL
BUN: 7 mg/dL (ref 6–23)
Chloride: 105 mEq/L (ref 96–112)
Creatinine, Ser: 0.7 mg/dL (ref 0.50–1.10)
GFR calc Af Amer: >90 mL/min (ref >90)

## 2013-05-28 MED ORDER — OXYCODONE-ACETAMINOPHEN 5-325 MG PO TABS
1.0000 | ORAL_TABLET | Freq: Four times a day (QID) | ORAL | Status: AC | PRN
  Administered 2013-05-28: 1 via ORAL
  Filled 2013-05-28: qty 1

## 2013-05-28 MED ORDER — PANTOPRAZOLE SODIUM 40 MG PO TBEC: 40.0000 mg | DELAYED_RELEASE_TABLET | Freq: Two times a day (BID) | ORAL | Status: DC

## 2013-05-28 NOTE — Telephone Encounter (Signed)
Patient has been R/S to 12/02 w/SLF and new instructions have been sent to her

## 2013-05-28 NOTE — Progress Notes (Signed)
Pt verbalizes understanding of d/c instructions, medications, and follow up appts. Pt has no questions at this time. IV d/c without complications. Pt d/c via wheelchair by NT, pts mother will be taking her home. Sheryn Bison

## 2013-05-28 NOTE — BH Assessment (Signed)
[facility] Assessment Progress Note  Attempted to schedule tele psych with provider for pt.  Pt discharged per her nurse, Abby @ 1112, and pt to follow up with her psychiatrist.

## 2013-05-28 NOTE — Clinical Social Work Note (Signed)
Patient does not want referral to inpatient tx for opioid dependence, wants to go home.  Says she will follow up w [facility] in [place] where she is currently receiving individual therapy.  Understands that she has been opioid free while in hospital, states that she wants to remain clean when returning home but acknowledged that she has thought about how to obtain pills again when she is discharged.  Affirmed patient desire to remain substance free, encouraged to call [facility] today to seek further help.  Santa Genera, LCSW Clinical Social Worker [phone])

## 2013-05-28 NOTE — Progress Notes (Signed)
Subjective:  Vomited yesterday evening. Some pink in the emesis. No stools since admission. Not interested in drug rehab. Plans to go home at discharge.   Objective: Vital signs in last 24 hours: Temp:  [97.5 F (36.4 C)-98.6 F (37 C)] 97.5 F (36.4 C) (11/07 0604) Pulse Rate:  [58-88] 58 (11/07 0604) Resp:  [20] 20 (11/07 0604) BP: (133-142)/(81-93) 142/93 mmHg (11/06 2147) SpO2:  [100 %] 100 % (11/07 0604) Last BM Date: 05/26/13 General:   Alert,  Well-developed, well-nourished, pleasant and cooperative in NAD Head:  Normocephalic and atraumatic. Eyes:  Sclera clear, no icterus.   Abdomen:  Soft, nontender and nondistended.  Normal bowel sounds, without guarding, and without rebound.   Extremities:  Without clubbing, deformity or edema. Neurologic:  Alert and  oriented x4;  grossly normal neurologically. Skin:  Intact without significant lesions or rashes. Psych:  Alert and cooperative. Normal mood and affect.  Intake/Output from previous day: 11/06 0701 - 11/07 0700 In: 720 [P.O.:720] Out: -  Intake/Output this shift:    Lab Results: CBC  Recent Labs  05/27/13 0311 05/27/13 0911 05/28/13 0610  WBC 8.8 8.5 7.5  HGB 11.1* 11.8* 12.2  HCT 33.6* 35.3* 37.1  MCV 90.3 90.1 90.3  PLT 251 271 258   BMET  Recent Labs  05/27/13 0002 05/27/13 0311 05/28/13 0610  NA 137 139 139  K 3.7 4.0 3.8  CL 101 109 105  CO2 24 22 22   GLUCOSE 117* 112* 100*  BUN 15 11 7   CREATININE 0.62 0.59 0.70  CALCIUM 9.2 8.1* 9.7   LFTs  Recent Labs  05/27/13 0002 05/27/13 0311  BILITOT 0.3 0.2*  ALKPHOS 54 57  AST 30 26  ALT 44* 37*  PROT 7.8 6.6  ALBUMIN 3.5 3.0*    Recent Labs  05/27/13 0002  LIPASE 18   PT/INR  Recent Labs  05/27/13 0002  LABPROT 12.6  INR 0.96        Assessment: 42 y/o female with h/o polysubstance abuse who presents with hematemesis and chronic GERD. No substantial change in H/H since admission. No evidence of recurrent hemolytic  anemia at this time. She takes Goody's powder several times per week. Suspect erosive/ulcerative reflux esophagitis, PUD, gastritis, M-W tear. Doubt significant upper GI bleed at this time. She is hemodynamically stable. Given her polysubstance abuse and current positive drug screen for cocaine, she is not a candidate for deep sedation in the OR.   H/H stable.  Plan: 1. Continue PPI BID at discharge.  2. EGD in 1-2 weeks if negative drug screen. Discussed at length with patient.  3. Avoid NSAIDs. No ASA.   LOS: 2 days   Tana Coast  05/28/2013, 7:47 AM

## 2013-05-28 NOTE — Telephone Encounter (Signed)
Please schedule EGD in OR with deep sedation with Dr. Darrick Penna in 1-2 weeks (not sooner). Patient will need pre-op visit with negative drug screen to have EGD per Dr. Jayme Cloud and associates.

## 2013-06-07 NOTE — Progress Notes (Signed)
Quick Note:  LMOM to call. ______ 

## 2013-06-07 NOTE — Discharge Summary (Signed)
Physician Discharge Summary  [name] ZOX:[id number] DOB: 28-Aug-1970 DOA: 05/26/2013  PCP: Alleen Borne  Admit date: 05/26/2013 Discharge date: 05/27/2013  Time spent: 45 minutes  Recommendations for Outpatient Follow-up:  1. Gi Dr.Fields in 1-2weeks for EGD  Discharge Diagnoses:  Principal Problem:   Hematemesis Active Problems:   PSORIASIS   Polysubstance dependence including opioid type drug, episodic abuse   Discharge Condition: stable  Diet recommendation: bland diet advance as tolerated  Filed Weights   05/26/13 2236 05/27/13 0244  Weight: 77.111 kg (170 lb) 80.06 kg (176 lb 8 oz)    History of present illness:  Chief Complaint: Vomiting up blood  HPI: [name] is a 42 y.o. female with a past medical history of hypertension, opioid dependence, who was hospitalized back in February for her hemolytic anemia. She presented today complaining of 4 episodes of bloody emesis. Patient has a history of IV narcotic use. Apparently, she couldn't use today. And, then she started having nausea, which she attributed to withdrawal. And, then she saw purplish material in her emesis. Denies any abdominal pain. Patient is actually quite somnolent though easily arousable. But she wouldn't stay awake enough for her to answer my all my questions. So history is very limited.   Hospital Course:  42 y/o female with h/o polysubstance abuse who presents with hematemesis and chronic GERD. No substantial change in H/H since admission. No evidence of recurrent hematemesis while inpatient, She takes Goody's powder several times per week. Suspect erosive/ulcerative reflux esophagitis, PUD, gastritis, M-W tear. Doubt significant upper GI bleed at this time. She is hemodynamically stable and Hb normal. Seen by GI, Given her polysubstance abuse and current positive drug screen for cocaine, she is not a candidate for deep sedation in the OR per GI and hence recommended to  -Continue  PPI BID at discharge.   -EGD in 1-2 weeks if negative drug screen. Discussed at length with patient.  - Avoid NSAIDs. No ASA Interms of her polysubstance/opiod abuse drug rehab was offered to the patient which she refused at this time and hence was given information on resourses in the coomunity.     Consultations:  Gi Dr.FIelds  Discharge Exam: Filed Vitals:   05/28/13 0604  BP:   Pulse: 58  Temp: 97.5 F (36.4 C)  Resp: 20    General: AAOx3 Cardiovascular: S1S2/RRR Respiratory: CTAB  Discharge Instructions  Discharge Orders   Future Appointments Provider Department Dept Phone   06/15/2013 11:00 AM [facility] [phone]   Future Orders Complete By Expires   Increase activity slowly  As directed        Medication List         aspirin EC 81 MG tablet  Take 81 mg by mouth daily.     clonazePAM 1 MG tablet  Commonly known as:  KLONOPIN  Take 1 mg by mouth 3 (three) times daily.     cyclobenzaprine 10 MG tablet  Commonly known as:  FLEXERIL  Take 10 mg by mouth 3 (three) times daily as needed for muscle spasms.     gabapentin 300 MG capsule  Commonly known as:  NEURONTIN  Take 300 mg by mouth 2 (two) times daily.     lisinopril-hydrochlorothiazide 20-12.5 MG per tablet  Commonly known as:  PRINZIDE,ZESTORETIC  Take 1 tablet by mouth daily.     mupirocin cream 2 %  Commonly known as:  BACTROBAN  Apply topically 3 (three) times daily.  pantoprazole 40 MG tablet  Commonly known as:  PROTONIX  Take 1 tablet (40 mg total) by mouth 2 (two) times daily.     ranitidine 150 MG tablet  Commonly known as:  ZANTAC  Take 150 mg by mouth 2 (two) times daily.       Allergies  Allergen Reactions  . Tramadol Hcl Anaphylaxis and Swelling    Of tongue  . Ketorolac Tromethamine Nausea And Vomiting  . Propoxyphene Napsylate Rash       Follow-up Information   Follow up with Jonette Eva, MD. Schedule an appointment as  soon as possible for a visit in 2 weeks.   Specialty:  Gastroenterology   Contact information:   [address] [place] [address] [phone]        The results of significant diagnostics from this hospitalization (including imaging, microbiology, ancillary and laboratory) are listed below for reference.    Significant Diagnostic Studies: Dg Chest Port 1 View  05/27/2013   CLINICAL DATA:  42-year- female with chest pain. Hepatitis. Initial encounter.  EXAM: PORTABLE CHEST - 1 VIEW  COMPARISON:  11/24/2012 and earlier.  FINDINGS: Portable AP upright view at 0006 hrs. Stable lung volumes. Normal cardiac size and mediastinal contours. Visualized tracheal air column is within normal limits. No pneumothorax. Allowing for portable technique, the lungs are clear.  IMPRESSION: No acute cardiopulmonary abnormality.   Electronically Signed   By: Augusto Gamble M.D.   On: 05/27/2013 00:18   Dg Hand Complete Left  05/14/2013   CLINICAL DATA:  Left hand and thumb pain, fell down stairs  EXAM: LEFT HAND - COMPLETE 3+ VIEW  COMPARISON:  Prior radiographs of the left hand 06/09/2012  FINDINGS: There is no evidence of fracture or dislocation. There is no evidence of arthropathy or other focal bone abnormality. Soft tissues are unremarkable.  IMPRESSION: Negative.   Electronically Signed   By: Malachy Moan M.D.   On: 05/14/2013 12:43   Dg Finger Thumb Right  05/14/2013   CLINICAL DATA:  Left hand and thumb pain, fell down stairs  EXAM: RIGHT THUMB 2+V  COMPARISON:  Concurrently obtained radiographs of the hand  FINDINGS: There is no evidence of fracture or dislocation. There is no evidence of arthropathy or other focal bone abnormality. Soft tissues are unremarkable  IMPRESSION: Negative.   Electronically Signed   By: Malachy Moan M.D.   On: 05/14/2013 12:43    Microbiology: No results found for this or any previous visit (from the past 240 hour(s)).    Labs: Basic Metabolic Panel: No results found for this basename: NA, K, CL, CO2, GLUCOSE, BUN, CREATININE, CALCIUM, MG, PHOS,  in the last 168 hours Liver Function Tests: No results found for this basename: AST, ALT, ALKPHOS, BILITOT, PROT, ALBUMIN,  in the last 168 hours No results found for this basename: LIPASE, AMYLASE,  in the last 168 hours No results found for this basename: AMMONIA,  in the last 168 hours CBC: No results found for this basename: WBC, NEUTROABS, HGB, HCT, MCV, PLT,  in the last 168 hours Cardiac Enzymes: No results found for this basename: CKTOTAL, CKMB, CKMBINDEX, TROPONINI,  in the last 168 hours BNP: BNP (last 3 results) No results found for this basename: PROBNP,  in the last 8760 hours CBG: No results found for this basename: GLUCAP,  in the last 168 hours     Signed:  Blayn Whetsell  [facility] 06/07/2013, 9:36 PM

## 2013-06-08 ENCOUNTER — Encounter (HOSPITAL_COMMUNITY): Payer: Self-pay

## 2013-06-14 ENCOUNTER — Encounter (HOSPITAL_COMMUNITY): Payer: Self-pay

## 2013-06-15 ENCOUNTER — Encounter (HOSPITAL_COMMUNITY)
Admit: 2013-06-15 | Discharge: 2013-06-15 | Disposition: A | Discharge status: Home / Self Care | Payer: Medicaid Other | Attending: Gastroenterology | Admitting: Gastroenterology

## 2013-06-22 ENCOUNTER — Ambulatory Visit (HOSPITAL_COMMUNITY)
Admission: RE | Admit: 2013-06-22 | Discharge: 2013-06-22 | Disposition: A | Discharge status: Home / Self Care | Payer: Medicaid Other | Source: Ambulatory Visit | Attending: Gastroenterology | Admitting: Gastroenterology

## 2013-06-22 ENCOUNTER — Ambulatory Visit (HOSPITAL_COMMUNITY): Payer: Medicaid Other | Admitting: Anesthesiology

## 2013-06-22 ENCOUNTER — Encounter (HOSPITAL_COMMUNITY): Payer: Medicaid Other | Admitting: Anesthesiology

## 2013-06-22 ENCOUNTER — Telehealth: Payer: Self-pay | Admitting: Gastroenterology

## 2013-06-22 ENCOUNTER — Encounter (HOSPITAL_COMMUNITY): Payer: Self-pay | Admitting: *Deleted

## 2013-06-22 ENCOUNTER — Encounter (HOSPITAL_COMMUNITY)
Admission: RE | Disposition: A | Discharge status: Home / Self Care | Payer: Self-pay | Source: Ambulatory Visit | Attending: Gastroenterology

## 2013-06-22 DIAGNOSIS — K920 Hematemesis: Secondary | ICD-10-CM | POA: Insufficient documentation

## 2013-06-22 DIAGNOSIS — Z538 Procedure and treatment not carried out for other reasons: Secondary | ICD-10-CM | POA: Insufficient documentation

## 2013-06-22 DIAGNOSIS — Z01812 Encounter for preprocedural laboratory examination: Secondary | ICD-10-CM | POA: Insufficient documentation

## 2013-06-22 DIAGNOSIS — D649 Anemia, unspecified: Secondary | ICD-10-CM | POA: Insufficient documentation

## 2013-06-22 LAB — RAPID URINE DRUG SCREEN, HOSP PERFORMED
Amphetamines: POSITIVE — AB
Cocaine: POSITIVE — AB
Opiates: NOT DETECTED
Tetrahydrocannabinol: POSITIVE — AB

## 2013-06-22 SURGERY — CANCELLED PROCEDURE

## 2013-06-22 MED ORDER — GLYCOPYRROLATE 0.2 MG/ML IJ SOLN: 0.2000 mg | Freq: Once | INTRAMUSCULAR | Status: DC

## 2013-06-22 MED ORDER — MIDAZOLAM HCL 2 MG/2ML IJ SOLN: 1.0000 mg | INTRAMUSCULAR | Status: DC | PRN

## 2013-06-22 MED ORDER — GLYCOPYRROLATE 0.2 MG/ML IJ SOLN
INTRAMUSCULAR | Status: AC
  Filled 2013-06-22: qty 1

## 2013-06-22 MED ORDER — MIDAZOLAM HCL 2 MG/2ML IJ SOLN
INTRAMUSCULAR | Status: AC
  Filled 2013-06-22: qty 2

## 2013-06-22 MED ORDER — FENTANYL CITRATE 0.05 MG/ML IJ SOLN
INTRAMUSCULAR | Status: AC
  Filled 2013-06-22: qty 2

## 2013-06-22 MED ORDER — LACTATED RINGERS IV SOLN: INTRAVENOUS | Status: DC

## 2013-06-22 MED ORDER — ONDANSETRON HCL 4 MG/2ML IJ SOLN
INTRAMUSCULAR | Status: AC
  Filled 2013-06-22: qty 2

## 2013-06-22 MED ORDER — FENTANYL CITRATE 0.05 MG/ML IJ SOLN: 25.0000 ug | INTRAMUSCULAR | Status: AC

## 2013-06-22 MED ORDER — LIDOCAINE HCL (PF) 1 % IJ SOLN
INTRAMUSCULAR | Status: AC
  Filled 2013-06-22: qty 2

## 2013-06-22 MED ORDER — ONDANSETRON HCL 4 MG/2ML IJ SOLN: 4.0000 mg | Freq: Once | INTRAMUSCULAR | Status: DC

## 2013-06-22 MED ORDER — BUTAMBEN-TETRACAINE-BENZOCAINE 2-2-14 % EX AERO
1.0000 | INHALATION_SPRAY | Freq: Once | CUTANEOUS | Status: AC
  Administered 2013-06-22: 1 via TOPICAL
  Filled 2013-06-22: qty 56

## 2013-06-22 MED ORDER — PROPOFOL 10 MG/ML IV BOLUS
INTRAVENOUS | Status: AC
  Filled 2013-06-22: qty 20

## 2013-06-22 SURGICAL SUPPLY — 18 items
BLOCK BITE 60FR ADLT L/F BLUE (MISCELLANEOUS) IMPLANT
ELECT REM PT RETURN 9FT ADLT (ELECTROSURGICAL)
ELECTRODE REM PT RTRN 9FT ADLT (ELECTROSURGICAL) IMPLANT
FLOOR PAD 36X40 (MISCELLANEOUS)
FORCEPS BIOP RAD 4 LRG CAP 4 (CUTTING FORCEPS) IMPLANT
FORMALIN 10 PREFIL 20ML (MISCELLANEOUS) IMPLANT
KIT CLEAN ENDO COMPLIANCE (KITS) IMPLANT
MANIFOLD NEPTUNE II (INSTRUMENTS) IMPLANT
NEEDLE SCLEROTHERAPY 25GX240 (NEEDLE) IMPLANT
PAD FLOOR 36X40 (MISCELLANEOUS) IMPLANT
PROBE APC STR FIRE (PROBE) IMPLANT
PROBE INJECTION GOLD (MISCELLANEOUS)
PROBE INJECTION GOLD 7FR (MISCELLANEOUS) IMPLANT
SNARE SHORT THROW 13M SML OVAL (MISCELLANEOUS) IMPLANT
SYR 50ML LL SCALE MARK (SYRINGE) IMPLANT
TUBING ENDO SMARTCAP PENTAX (MISCELLANEOUS) IMPLANT
TUBING IRRIGATION ENDOGATOR (MISCELLANEOUS) IMPLANT
WATER STERILE IRR 1000ML POUR (IV SOLUTION) IMPLANT

## 2013-06-22 NOTE — Op Note (Signed)
URINE TOXICOLOGY REPORT BACK  CASE IS CANCELLED PER DR FIELDS AND DR Marcos Eke

## 2013-06-22 NOTE — Telephone Encounter (Addendum)
PT PRESENTED AND ADMITTED TO USING COCAINE 7 DAYS AGO. UDS POS FOR AMPHETAMINES, COCAINE, THC, AND BENZOS. EGD CANCELLED. EXPLAINED TO PT SHE NEEDS TO BE 30 DAYS W/O ILLICIT DRUGS FOR A URINE DRUG SCREEN TO BE NEGATIVE. WILL CONTACT PT JAN 2 DOR UDS. IF PT UDS POS, PT WILL BE DISCHARGED FROM THE PRACTICE. PT VOICED HER UNDERSTANDING.

## 2013-06-22 NOTE — H&P (Addendum)
PT PRESENTED AND ADMITTED TO USING COCAINE 7 DAYS AGO. UDS POS FOR AMPHETAMINES, COCAINE, THC, AND BENZOS. EGD CANCELLED. EXPLAINED TO PT SHE NEEDS TO BE 30 DAYS W/O ILLICIT DRUGS FOR A URINE DRUG SCREEN TO BE NEGATIVE. WILL CONTACT PT IN 30 DAYS TO [name] EGD. IF PT UDS POS, PT WILL BE DISCHARGED FROM THE PRACTICE. PT VOICED HER UNDERSTANDING.   Primary Care Physician:  Alleen Borne Primary Gastroenterologist:  Dr. Darrick Penna  Pre-Procedure History & Physical: HPI:  [name] is a 42 y.o. female here for HEMATEMESIS.  Past Medical History  Diagnosis Date  . Hypertension   . Depressed   . Drug addiction Hep C  . Hepatitis C     pt denies any treatment  . Gastroparesis   . Gastroesophageal reflux disease   . Polysubstance dependence including opioid type drug, episodic abuse   . Cancer   . Cervical cancer   . Renal disorder     kidney stones    Past Surgical History  Procedure Laterality Date  . Appendectomy    . Tubal ligation    . Mandible surgery for fracture    . Esophagogastroduodenoscopy  01/04/2008    Dr Teddy Spike reflux esophagitis, sm HH, gastritis, BRAVO positive for GERD on BID PPI    Prior to Admission medications   Medication Sig Start Date End Date Taking? Authorizing Provider  aspirin EC 81 MG tablet Take 81 mg by mouth daily.   Yes Historical Provider, MD  clonazePAM (KLONOPIN) 1 MG tablet Take 1 mg by mouth 3 (three) times daily.   Yes Historical Provider, MD  cyclobenzaprine (FLEXERIL) 10 MG tablet Take 10 mg by mouth 3 (three) times daily as needed for muscle spasms.   Yes Historical Provider, MD  gabapentin (NEURONTIN) 300 MG capsule Take 300 mg by mouth 2 (two) times daily.   Yes Historical Provider, MD  lisinopril-hydrochlorothiazide (PRINZIDE,ZESTORETIC) 20-12.5 MG per tablet Take 1 tablet by mouth daily.   Yes Historical Provider, MD  pantoprazole (PROTONIX) 40 MG tablet Take 1 tablet (40 mg total) by mouth 2 (two) times daily. 05/28/13   Yes Zannie Cove, MD  ranitidine (ZANTAC) 150 MG tablet Take 150 mg by mouth 2 (two) times daily.   Yes Historical Provider, MD    Allergies as of 05/28/2013 - Review Complete 05/27/2013  Allergen Reaction Noted  . Tramadol hcl Anaphylaxis and Swelling   . Ketorolac tromethamine Nausea And Vomiting   . Propoxyphene napsylate Rash 08/12/2007    Family History  Problem Relation Age of Onset  . Colon cancer Neg Hx   . Liver disease Neg Hx     History   Social History  . Marital Status: Divorced    Spouse Name: N/A    Number of Children: 2  . Years of Education: N/A   Occupational History  . disabled    Social History Main Topics  . Smoking status: Current Every Day Smoker -- 1.00 packs/day for 15 years    Types: Cigarettes  . Smokeless tobacco: Not on file  . Alcohol Use: Yes  . Drug Use: Yes    Special: Cocaine, IV, "Crack" cocaine, Marijuana     Comment: cocaine, crack, marijuana and OPANA IV-last used 05/26/13  . Sexual Activity: Yes    Birth Control/ Protection: None, Surgical   Other Topics Concern  . Not on file   Social History Narrative   Lives w/ mother & 2 children (17 &13)    Review of Systems: See HPI,  otherwise negative ROS   Physical Exam: BP 149/98  Temp(Src) 98 F (36.7 C) (Oral)  SpO2 98%  LMP 06/04/2013 General:   Alert,  pleasant and cooperative in NAD Head:  Normocephalic and atraumatic. Neck:  Supple; Lungs:  Clear throughout to auscultation.    Heart:  Regular rate and rhythm. Abdomen:  Soft, nontender and nondistended. Normal bowel sounds, without guarding, and without rebound.   Neurologic:  Alert and  oriented x4;  grossly normal neurologically.  Impression/Plan:     HEMATEMESIS  PLAN:  EGD TODAY

## 2013-06-22 NOTE — Anesthesia Preprocedure Evaluation (Addendum)
Anesthesia Evaluation  Patient identified by MRN, date of birth, ID band Patient awake    Reviewed: Allergy & Precautions, H&P , NPO status , Patient's Chart, lab work & pertinent test results  Airway Mallampati: II TM Distance: >3 FB Neck ROM: Full    Dental  (+) Edentulous Upper and Edentulous Lower   Pulmonary Current Smoker,  breath sounds clear to auscultation        Cardiovascular hypertension, Pt. on medications Rhythm:Regular Rate:Normal     Neuro/Psych PSYCHIATRIC DISORDERS Anxiety Depression  Neuromuscular disease    GI/Hepatic PUD, GERD-  ,(+)     substance abuse  cocaine use, marijuana use and IV drug use, Hepatitis -, C  Endo/Other    Renal/GU      Musculoskeletal   Abdominal   Peds  Hematology  (+) anemia ,   Anesthesia Other Findings Drug screen this AM positive for multiple substances including cocaine. Surgery cancelled, pt advised to discontinue cocaine prior to elective procedures.  Reproductive/Obstetrics                         Anesthesia Physical Anesthesia Plan  ASA: III  Anesthesia Plan: MAC   Post-op Pain Management:    Induction: Intravenous  Airway Management Planned: Simple Face Mask  Additional Equipment:   Intra-op Plan:   Post-operative Plan:   Informed Consent: I have reviewed the patients History and Physical, chart, labs and discussed the procedure including the risks, benefits and alternatives for the proposed anesthesia with the patient or authorized representative who has indicated his/her understanding and acceptance.     Plan Discussed with:   Anesthesia Plan Comments:         Anesthesia Quick Evaluation

## 2013-07-23 ENCOUNTER — Other Ambulatory Visit: Payer: Self-pay

## 2013-07-23 DIAGNOSIS — F1910 Other psychoactive substance abuse, uncomplicated: Secondary | ICD-10-CM

## 2013-07-23 NOTE — Telephone Encounter (Signed)
Also mailed pt a copy of the lab with letter reminding her Dr. Oneida Alar has requested this.

## 2013-07-23 NOTE — Telephone Encounter (Signed)
Tried to call pt. Could not leave a message. Many rings and then a recording said enter remote access code.  Lab order faxed to [facility].

## 2013-09-02 ENCOUNTER — Emergency Department (HOSPITAL_COMMUNITY)
Admission: EM | Admit: 2013-09-02 | Discharge: 2013-09-02 | Disposition: A | Discharge status: Home / Self Care | Payer: Medicaid Other | Attending: Emergency Medicine | Admitting: Emergency Medicine

## 2013-09-02 ENCOUNTER — Encounter (HOSPITAL_COMMUNITY): Payer: Self-pay | Admitting: Emergency Medicine

## 2013-09-02 DIAGNOSIS — Z8541 Personal history of malignant neoplasm of cervix uteri: Secondary | ICD-10-CM | POA: Insufficient documentation

## 2013-09-02 DIAGNOSIS — L02429 Furuncle of limb, unspecified: Secondary | ICD-10-CM | POA: Insufficient documentation

## 2013-09-02 DIAGNOSIS — L02413 Cutaneous abscess of right upper limb: Secondary | ICD-10-CM

## 2013-09-02 DIAGNOSIS — IMO0002 Reserved for concepts with insufficient information to code with codable children: Secondary | ICD-10-CM | POA: Insufficient documentation

## 2013-09-02 DIAGNOSIS — K219 Gastro-esophageal reflux disease without esophagitis: Secondary | ICD-10-CM | POA: Insufficient documentation

## 2013-09-02 DIAGNOSIS — Z3202 Encounter for pregnancy test, result negative: Secondary | ICD-10-CM | POA: Insufficient documentation

## 2013-09-02 DIAGNOSIS — F1220 Cannabis dependence, uncomplicated: Secondary | ICD-10-CM | POA: Insufficient documentation

## 2013-09-02 DIAGNOSIS — F17200 Nicotine dependence, unspecified, uncomplicated: Secondary | ICD-10-CM | POA: Insufficient documentation

## 2013-09-02 DIAGNOSIS — F1120 Opioid dependence, uncomplicated: Secondary | ICD-10-CM | POA: Insufficient documentation

## 2013-09-02 DIAGNOSIS — L02439 Carbuncle of limb, unspecified: Secondary | ICD-10-CM

## 2013-09-02 DIAGNOSIS — Z79899 Other long term (current) drug therapy: Secondary | ICD-10-CM | POA: Insufficient documentation

## 2013-09-02 DIAGNOSIS — Z7982 Long term (current) use of aspirin: Secondary | ICD-10-CM | POA: Insufficient documentation

## 2013-09-02 DIAGNOSIS — F329 Major depressive disorder, single episode, unspecified: Secondary | ICD-10-CM | POA: Insufficient documentation

## 2013-09-02 DIAGNOSIS — I10 Essential (primary) hypertension: Secondary | ICD-10-CM | POA: Insufficient documentation

## 2013-09-02 DIAGNOSIS — Z8619 Personal history of other infectious and parasitic diseases: Secondary | ICD-10-CM | POA: Insufficient documentation

## 2013-09-02 DIAGNOSIS — F3289 Other specified depressive episodes: Secondary | ICD-10-CM | POA: Insufficient documentation

## 2013-09-02 DIAGNOSIS — F1110 Opioid abuse, uncomplicated: Secondary | ICD-10-CM

## 2013-09-02 DIAGNOSIS — Z87442 Personal history of urinary calculi: Secondary | ICD-10-CM | POA: Insufficient documentation

## 2013-09-02 LAB — RAPID URINE DRUG SCREEN, HOSP PERFORMED
AMPHETAMINES: NOT DETECTED
BARBITURATES: NOT DETECTED
Benzodiazepines: POSITIVE — AB
COCAINE: NOT DETECTED
OPIATES: POSITIVE — AB
Tetrahydrocannabinol: POSITIVE — AB

## 2013-09-02 LAB — CBC WITH DIFFERENTIAL/PLATELET
Basophils Absolute: 0.1 10*3/uL (ref 0.0–0.1)
Basophils Relative: 1 % (ref 0–1)
Eosinophils Absolute: 0.4 10*3/uL (ref 0.0–0.7)
Eosinophils Relative: 4 % (ref 0–5)
HCT: 38.7 % (ref 36.0–46.0)
Hemoglobin: 13 g/dL (ref 12.0–15.0)
Lymphocytes Relative: 51 % — ABNORMAL HIGH (ref 12–46)
Lymphs Abs: 5 10*3/uL — ABNORMAL HIGH (ref 0.7–4.0)
MCH: 29.3 pg (ref 26.0–34.0)
MCHC: 33.6 g/dL (ref 30.0–36.0)
MCV: 87.4 fL (ref 78.0–100.0)
MONOS PCT: 5 % (ref 3–12)
Monocytes Absolute: 0.5 10*3/uL (ref 0.1–1.0)
NEUTROS ABS: 3.9 10*3/uL (ref 1.7–7.7)
Neutrophils Relative %: 39 % — ABNORMAL LOW (ref 43–77)
Platelets: ADEQUATE 10*3/uL (ref 150–400)
RBC: 4.43 MIL/uL (ref 3.87–5.11)
RDW: 13.8 % (ref 11.5–15.5)
WBC: 9.9 10*3/uL (ref 4.0–10.5)

## 2013-09-02 LAB — URINALYSIS, ROUTINE W REFLEX MICROSCOPIC
BILIRUBIN URINE: NEGATIVE
Glucose, UA: NEGATIVE mg/dL
HGB URINE DIPSTICK: NEGATIVE
Ketones, ur: NEGATIVE mg/dL
Leukocytes, UA: NEGATIVE
NITRITE: NEGATIVE
PROTEIN: NEGATIVE mg/dL
Specific Gravity, Urine: >1.03 — ABNORMAL HIGH (ref 1.005–1.030)
UROBILINOGEN UA: 0.2 mg/dL (ref 0.0–1.0)
pH: 5.5 (ref 5.0–8.0)

## 2013-09-02 LAB — ETHANOL: Alcohol, Ethyl (B): <11 mg/dL (ref 0–11)

## 2013-09-02 LAB — BASIC METABOLIC PANEL
BUN: 9 mg/dL (ref 6–23)
CALCIUM: 9.5 mg/dL (ref 8.4–10.5)
CO2: 23 meq/L (ref 19–32)
Chloride: 101 mEq/L (ref 96–112)
Creatinine, Ser: 0.65 mg/dL (ref 0.50–1.10)
GFR calc Af Amer: >90 mL/min (ref >90)
Glucose, Bld: 105 mg/dL — ABNORMAL HIGH (ref 70–99)
POTASSIUM: 4.4 meq/L (ref 3.7–5.3)
SODIUM: 138 meq/L (ref 137–147)

## 2013-09-02 LAB — PREGNANCY, URINE: Preg Test, Ur: NEGATIVE

## 2013-09-02 MED ORDER — SULFAMETHOXAZOLE-TMP DS 800-160 MG PO TABS: 1.0000 | ORAL_TABLET | Freq: Two times a day (BID) | ORAL | Status: DC

## 2013-09-02 MED ORDER — PROMETHAZINE HCL 25 MG PO TABS: 25.0000 mg | ORAL_TABLET | Freq: Four times a day (QID) | ORAL | Status: DC | PRN

## 2013-09-02 MED ORDER — CLONIDINE HCL 0.2 MG/24HR TD PTWK
0.2000 mg | MEDICATED_PATCH | TRANSDERMAL | Status: DC
Start: 2013-09-02 — End: 2013-09-30

## 2013-09-02 MED ORDER — SULFAMETHOXAZOLE-TMP DS 800-160 MG PO TABS
1.0000 | ORAL_TABLET | Freq: Once | ORAL | Status: AC
  Administered 2013-09-02: 1 via ORAL
  Filled 2013-09-02: qty 1

## 2013-09-02 MED ORDER — IBUPROFEN 800 MG PO TABS
800.0000 mg | ORAL_TABLET | Freq: Once | ORAL | Status: AC
  Administered 2013-09-02: 800 mg via ORAL
  Filled 2013-09-02: qty 1

## 2013-09-02 MED ORDER — CLONIDINE HCL 0.1 MG/24HR TD PTWK
0.2000 mg | MEDICATED_PATCH | Freq: Once | TRANSDERMAL | Status: DC
  Administered 2013-09-02: 0.2 mg via TRANSDERMAL
  Filled 2013-09-02: qty 2

## 2013-09-02 NOTE — ED Notes (Signed)
telepsych in progress 

## 2013-09-02 NOTE — ED Notes (Signed)
Pt changed into blue scrubs, mother will take belongings home

## 2013-09-02 NOTE — ED Notes (Signed)
Pt wanded by security. 

## 2013-09-02 NOTE — ED Notes (Signed)
Pt reports wants help getting off of xanax and opana.  Last had opana 2 days ago and xanax yesterday.  C/o generalized body aches.  Denies any SI or HI.

## 2013-09-02 NOTE — ED Provider Notes (Addendum)
CSN: [id number]     Arrival date & time 09/02/13  1528 History   First MD Initiated Contact with Patient 09/02/13 1720     Chief Complaint  Patient presents with  . V70.1     (Consider location/radiation/quality/duration/timing/severity/associated sxs/prior Treatment) HPI...... patient states she's been using intravenous Opana for 6 months. She buys it off the streets. No drug usage in 2 days. She wants help for detox.  No suicidal or homicidal ideation. Also smokes MJ.  No cocaine or amphetamines.  Also complains of boil on right forearm  Past Medical History  Diagnosis Date  . Hypertension   . Depressed   . Drug addiction Hep C  . Hepatitis C     pt denies any treatment  . Gastroparesis   . Gastroesophageal reflux disease   . Polysubstance dependence including opioid type drug, episodic abuse   . Cancer   . Cervical cancer   . Renal disorder     kidney stones   Past Surgical History  Procedure Laterality Date  . Appendectomy    . Tubal ligation    . Mandible surgery for fracture    . Esophagogastroduodenoscopy  01/04/2008    Dr Lamar Laundry reflux esophagitis, sm HH, gastritis, BRAVO positive for GERD on BID PPI   Family History  Problem Relation Age of Onset  . Colon cancer Neg Hx   . Liver disease Neg Hx    History  Substance Use Topics  . Smoking status: Current Every Day Smoker -- 1.00 packs/day for 15 years    Types: Cigarettes  . Smokeless tobacco: Not on file  . Alcohol Use: Yes     Comment: occ   OB History   Grav Para Term Preterm Abortions TAB SAB Ect Mult Living                 Review of Systems  All other systems reviewed and are negative.      Allergies  Tramadol hcl; Ketorolac tromethamine; and Propoxyphene napsylate  Home Medications   Current Outpatient Rx  Name  Route  Sig  Dispense  Refill  . aspirin EC 81 MG tablet   Oral   Take 81 mg by mouth daily.         . clonazePAM (KLONOPIN) 1 MG tablet   Oral   Take 1 mg by mouth  3 (three) times daily.         . cyclobenzaprine (FLEXERIL) 10 MG tablet   Oral   Take 10 mg by mouth 3 (three) times daily as needed for muscle spasms.         Marland Kitchen HYDROcodone-acetaminophen (NORCO) 7.5-325 MG per tablet   Oral   Take 1 tablet by mouth every 6 (six) hours as needed for moderate pain.         Marland Kitchen lisinopril-hydrochlorothiazide (PRINZIDE,ZESTORETIC) 20-12.5 MG per tablet   Oral   Take 1 tablet by mouth daily.         . pantoprazole (PROTONIX) 40 MG tablet   Oral   Take 1 tablet (40 mg total) by mouth 2 (two) times daily.   30 tablet   0   . ranitidine (ZANTAC) 150 MG tablet   Oral   Take 150 mg by mouth 2 (two) times daily.         Marland Kitchen gabapentin (NEURONTIN) 300 MG capsule   Oral   Take 300 mg by mouth 2 (two) times daily.          BP 162/109  Pulse 85  Temp(Src) 98.1 F (36.7 C) (Oral)  Resp 18  SpO2 100% Physical Exam  Nursing note and vitals reviewed. Constitutional: She is oriented to person, place, and time. She appears well-developed and well-nourished.  HENT:  Head: Normocephalic and atraumatic.  Eyes: Conjunctivae and EOM are normal. Pupils are equal, round, and reactive to light.  Neck: Normal range of motion. Neck supple.  Cardiovascular: Normal rate, regular rhythm and normal heart sounds.   Pulmonary/Chest: Effort normal and breath sounds normal.  Abdominal: Soft. Bowel sounds are normal.  Musculoskeletal: Normal range of motion.  Neurological: She is alert and oriented to person, place, and time.  Skin:  Right forearm:   2 x 2 centimeter boil on posterior aspect   Psychiatric:  Flat affect, depressed    ED Course  Procedures (including critical care time) Labs Review Labs Reviewed  CBC WITH DIFFERENTIAL - Abnormal; Notable for the following:    Neutrophils Relative % 39 (*)    Lymphocytes Relative 51 (*)    Lymphs Abs 5.0 (*)    All other components within normal limits  BASIC METABOLIC PANEL - Abnormal; Notable for the  following:    Glucose, Bld 105 (*)    All other components within normal limits  URINE RAPID DRUG SCREEN (HOSP PERFORMED) - Abnormal; Notable for the following:    Opiates POSITIVE (*)    Benzodiazepines POSITIVE (*)    Tetrahydrocannabinol POSITIVE (*)    All other components within normal limits  URINALYSIS, ROUTINE W REFLEX MICROSCOPIC - Abnormal; Notable for the following:    Specific Gravity, Urine >1.030 (*)    All other components within normal limits  ETHANOL  PREGNANCY, URINE   Imaging Review No results found.  EKG Interpretation   None       MDM   Final diagnoses:  None    Will obtain behavioral health consult. Rx Septra DS twice a day for right forearm abscess  2100:   Behavioral health consultation obtained.  Recommend outpatient treatment for opiate addiction.   Rx Septra DS 2 times a day for boil on right forearm and Phenergan 25 mg for nausea and Catapres patch 0.2 mg every 24 hours  Nat Christen, MD 09/02/13 2041  Nat Christen, MD 09/02/13 2119  Nat Christen, MD 09/04/13 [phone]

## 2013-09-02 NOTE — Discharge Instructions (Signed)
Antibiotic twice a day for the following arm. Moist heat.  Prescription for nausea medicine. Also prescription for Catapres patch which will help with withdrawal. Followup with mental health resources given to you.

## 2013-09-02 NOTE — ED Notes (Signed)
Pt's discharge condition charted in error

## 2013-09-02 NOTE — BH Assessment (Signed)
Assessment Note  [name] is a 43 y.o. female presenting to [facility] for detox services.  Pt denies SI/HI/AVH.  Pt reports the following: pt has been abusing opana, xanax and thc for 1 yr.  She states family problems, mother's health and father's hx of drug addiction have attributed to her use.  Pt admits using 40-60mg  of IV opana, daily, last use was 2 days ago.  Pt used 40mg  and shoots in bilateral arms and hands, per Dr. Nat Christen, pt has abscess on arm at injection area. Pt received antibiotic for treatment.  Pt also uses 10-15, 1mg  xanax pills, daily, last use was 09/02/13, pt used 3-1mg  pills. Pt consumes an unk amount of marijuana, daily, last use unk.  Pt endorses depressive sxs due to drug use: crying spells, isolation, anxiety w/panic attacks and she is c/o w/d sxs: body aches, stomach cramps, nausea, diarrhea, sweats and hot flashes.  Pt tells this Probation officer that she doe not want inpt treatment, would like referrals for outpatient services.       Axis I: Opioid use disorder, Severe; Sedative, hypnotic, or anxiolytic use disorder, Severe; Cannabis use disorder  Axis II: Deferred Axis III:  Past Medical History  Diagnosis Date  . Hypertension   . Depressed   . Drug addiction Hep C  . Hepatitis C     pt denies any treatment  . Gastroparesis   . Gastroesophageal reflux disease   . Polysubstance dependence including opioid type drug, episodic abuse   . Cancer   . Cervical cancer   . Renal disorder     kidney stones   Axis IV: other psychosocial or environmental problems, problems related to social environment and problems with primary support group Axis V: 41-50 serious symptoms  Past Medical History:  Past Medical History  Diagnosis Date  . Hypertension   . Depressed   . Drug addiction Hep C  . Hepatitis C     pt denies any treatment  . Gastroparesis   . Gastroesophageal reflux disease   . Polysubstance dependence including opioid type drug, episodic abuse   . Cancer    . Cervical cancer   . Renal disorder     kidney stones    Past Surgical History  Procedure Laterality Date  . Appendectomy    . Tubal ligation    . Mandible surgery for fracture    . Esophagogastroduodenoscopy  01/04/2008    Dr Lamar Laundry reflux esophagitis, sm HH, gastritis, BRAVO positive for GERD on BID PPI    Family History:  Family History  Problem Relation Age of Onset  . Colon cancer Neg Hx   . Liver disease Neg Hx     Social History:  reports that she has been smoking Cigarettes.  She has a 15 pack-year smoking history. She does not have any smokeless tobacco history on file. She reports that she drinks alcohol. She reports that she uses illicit drugs (Cocaine, IV, "Crack" cocaine, Marijuana, and Opium).  Additional Social History:  Alcohol / Drug Use Pain Medications: See MAR  Prescriptions: See MAR  Over the Counter: See MAR  History of alcohol / drug use?: Yes Longest period of sobriety (when/how long): None  Negative Consequences of Use: Work / School;Personal relationships;Financial Withdrawal Symptoms: Cramps;Diarrhea;Fever / Chills;Irritability;Nausea / Vomiting;Sweats Substance #1 Name of Substance 1: Opana  1 - Age of First Use: 24 YOF 1 - Amount (size/oz): 40-60mg   1 - Frequency: Daily  1 - Duration: On-going  1 - Last Use /  Amount: 08/31/13 Substance #2 Name of Substance 2: Xanax  2 - Age of First Use: 88 YOF  2 - Amount (size/oz): 10-15 1mg  Pills  2 - Frequency: Daily  2 - Duration: On-going  2 - Last Use / Amount: 09/02/13 Substance #3 Name of Substance 3: THC  3 - Age of First Use: 20's  3 - Amount (size/oz): Unk  3 - Frequency: Daily  3 - Duration: On-going  3 - Last Use / Amount: Unk   CIWA: CIWA-Ar BP: 159/100 mmHg Pulse Rate: 79 COWS:    Allergies:  Allergies  Allergen Reactions  . Tramadol Hcl Anaphylaxis and Swelling    Of tongue  . Ketorolac Tromethamine Nausea And Vomiting  . Propoxyphene Napsylate Rash    Home  Medications:  (Not in a hospital admission)  OB/GYN Status:  No LMP recorded.  General Assessment Data Location of Assessment: [facility] Is this a Tele or Face-to-Face Assessment?: Tele Assessment Is this an Initial Assessment or a Re-assessment for this encounter?: Initial Assessment Living Arrangements: Children;Parent Can pt return to current living arrangement?: Yes Admission Status: Voluntary Is patient capable of signing voluntary admission?: Yes Transfer from: [facility] Referral Source: MD  Medical Screening Exam ([place]) Medical Exam completed: No Reason for MSE not completed: Other: (None )  [place] Living Arrangements: Children;Parent Name of Psychiatrist: [place]  Name of Therapist: [place]   Education Status Is patient currently in school?: No Current Grade: None  Highest grade of school patient has completed: None  Name of school: None  Contact person: None   Risk to self Suicidal Ideation: No Suicidal Intent: No Is patient at risk for suicide?: No Suicidal Plan?: No Access to Means: No What has been your use of drugs/alcohol within the last 12 months?: Abusing: opiates, xanax, thc  Previous Attempts/Gestures: No How many times?: 0 Other Self Harm Risks: None  Triggers for Past Attempts: None known Intentional Self Injurious Behavior: None Family Suicide History: No Recent stressful life event(s): Conflict (Comment) (Family issues) Persecutory voices/beliefs?: No Depression: Yes Depression Symptoms: Tearfulness;Isolating;Fatigue;Loss of interest in usual pleasures Substance abuse history and/or treatment for substance abuse?: Yes Suicide prevention information given to non-admitted patients: Not applicable  Risk to Others Homicidal Ideation: No Thoughts of Harm to Others: No Current Homicidal Intent: No Current Homicidal Plan: No Access to Homicidal Means: No Identified Victim: None   History of harm to others?: No Assessment of Violence: None Noted Violent Behavior Description: None  Does patient have access to weapons?: No Criminal Charges Pending?: No Does patient have a court date: No  Psychosis Hallucinations: None noted Delusions: None noted  Mental Status Report Appear/Hygiene: Disheveled Eye Contact: Good Motor Activity: Unremarkable Speech: Logical/coherent Level of Consciousness: Crying;Alert Mood: Depressed;Sad Affect: Depressed;Appropriate to circumstance;Sad Anxiety Level: Minimal Thought Processes: Coherent;Relevant Judgement: Unimpaired Orientation: Place;Person;Time;Situation Obsessive Compulsive Thoughts/Behaviors: None  Cognitive Functioning Concentration: Normal Memory: Recent Intact;Remote Intact IQ: Average Insight: Fair Impulse Control: Fair Appetite: Fair Weight Loss: 0 Weight Gain: 0 Sleep: Decreased Total Hours of Sleep: 5 Vegetative Symptoms: None  ADLScreening [facility] Assessment Services) Patient's cognitive ability adequate to safely complete daily activities?: Yes Patient able to express need for assistance with ADLs?: Yes Independently performs ADLs?: Yes (appropriate for developmental age)  Prior Inpatient Therapy Prior Inpatient Therapy: No Prior Therapy Dates: None  Prior Therapy Facilty/Provider(s): None  Reason for Treatment: None   Prior Outpatient Therapy Prior Outpatient Therapy: Yes Prior Therapy Dates: Current  Prior Therapy Facilty/Provider(s): [facility]  Reason for Treatment: Med Mgt/Therapy   ADL Screening (condition at time of admission) Patient's cognitive ability adequate to safely complete daily activities?: Yes Is the patient deaf or have difficulty hearing?: No Does the patient have difficulty seeing, even when wearing glasses/contacts?: No Does the patient have difficulty concentrating, remembering, or making decisions?: No Patient able to express need for assistance with  ADLs?: Yes Does the patient have difficulty dressing or bathing?: No Independently performs ADLs?: Yes (appropriate for developmental age) Does the patient have difficulty walking or climbing stairs?: No Weakness of Legs: None Weakness of Arms/Hands: None  Home Assistive Devices/Equipment Home Assistive Devices/Equipment: None  Therapy Consults (therapy consults require a physician order) PT Evaluation Needed: No OT Evalulation Needed: No SLP Evaluation Needed: No Abuse/Neglect Assessment (Assessment to be complete while patient is alone) Physical Abuse: Denies Verbal Abuse: Denies Sexual Abuse: Denies Exploitation of patient/patient's resources: Denies Self-Neglect: Denies Values / Beliefs Cultural Requests During Hospitalization: None Spiritual Requests During Hospitalization: None Consults Spiritual Care Consult Needed: No Social Work Consult Needed: No Regulatory affairs officer (For Healthcare) Advance Directive: Patient does not have advance directive;Patient would not like information Pre-existing out of facility DNR order (yellow form or pink MOST form): No Nutrition Screen- [facility] Adult/[facility] Patient's home diet: Regular  Additional Information 1:1 In Past 12 Months?: No CIRT Risk: No Elopement Risk: No Does patient have medical clearance?: Yes     Disposition:  Disposition Initial Assessment Completed for this Encounter: Yes Disposition of Patient: Outpatient treatment;Referred to (Referrals provided for outpt SA services ) Type of outpatient treatment: Adult Patient referred to: Outpatient clinic referral (Referrals provided for outpt SA services )  On Site Evaluation by:   Reviewed with Physician:    Girtha Rm 09/02/2013 9:43 PM

## 2013-09-30 ENCOUNTER — Emergency Department (HOSPITAL_COMMUNITY)
Admission: EM | Admit: 2013-09-30 | Discharge: 2013-09-30 | Disposition: A | Discharge status: Home / Self Care | Payer: Medicaid Other | Attending: Emergency Medicine | Admitting: Emergency Medicine

## 2013-09-30 ENCOUNTER — Emergency Department (HOSPITAL_COMMUNITY): Payer: Medicaid Other

## 2013-09-30 ENCOUNTER — Encounter (HOSPITAL_COMMUNITY): Payer: Self-pay | Admitting: Emergency Medicine

## 2013-09-30 DIAGNOSIS — K219 Gastro-esophageal reflux disease without esophagitis: Secondary | ICD-10-CM | POA: Insufficient documentation

## 2013-09-30 DIAGNOSIS — Z8541 Personal history of malignant neoplasm of cervix uteri: Secondary | ICD-10-CM | POA: Insufficient documentation

## 2013-09-30 DIAGNOSIS — Z9089 Acquired absence of other organs: Secondary | ICD-10-CM | POA: Insufficient documentation

## 2013-09-30 DIAGNOSIS — I10 Essential (primary) hypertension: Secondary | ICD-10-CM | POA: Insufficient documentation

## 2013-09-30 DIAGNOSIS — K2990 Gastroduodenitis, unspecified, without bleeding: Principal | ICD-10-CM

## 2013-09-30 DIAGNOSIS — F3289 Other specified depressive episodes: Secondary | ICD-10-CM | POA: Insufficient documentation

## 2013-09-30 DIAGNOSIS — Z7982 Long term (current) use of aspirin: Secondary | ICD-10-CM | POA: Insufficient documentation

## 2013-09-30 DIAGNOSIS — Z87442 Personal history of urinary calculi: Secondary | ICD-10-CM | POA: Insufficient documentation

## 2013-09-30 DIAGNOSIS — K920 Hematemesis: Secondary | ICD-10-CM | POA: Insufficient documentation

## 2013-09-30 DIAGNOSIS — K2970 Gastritis, unspecified, without bleeding: Secondary | ICD-10-CM | POA: Insufficient documentation

## 2013-09-30 DIAGNOSIS — Z79899 Other long term (current) drug therapy: Secondary | ICD-10-CM | POA: Insufficient documentation

## 2013-09-30 DIAGNOSIS — F329 Major depressive disorder, single episode, unspecified: Secondary | ICD-10-CM | POA: Insufficient documentation

## 2013-09-30 DIAGNOSIS — Z8619 Personal history of other infectious and parasitic diseases: Secondary | ICD-10-CM | POA: Insufficient documentation

## 2013-09-30 DIAGNOSIS — F17200 Nicotine dependence, unspecified, uncomplicated: Secondary | ICD-10-CM | POA: Insufficient documentation

## 2013-09-30 LAB — CBC WITH DIFFERENTIAL/PLATELET
BASOS ABS: 0 10*3/uL (ref 0.0–0.1)
Basophils Relative: 0 % (ref 0–1)
Eosinophils Absolute: 0.1 10*3/uL (ref 0.0–0.7)
Eosinophils Relative: 1 % (ref 0–5)
HEMATOCRIT: 40 % (ref 36.0–46.0)
Hemoglobin: 13.4 g/dL (ref 12.0–15.0)
LYMPHS ABS: 4.7 10*3/uL — AB (ref 0.7–4.0)
Lymphocytes Relative: 38 % (ref 12–46)
MCH: 29.3 pg (ref 26.0–34.0)
MCHC: 33.5 g/dL (ref 30.0–36.0)
MCV: 87.3 fL (ref 78.0–100.0)
MONO ABS: 0.6 10*3/uL (ref 0.1–1.0)
Monocytes Relative: 5 % (ref 3–12)
NEUTROS ABS: 6.9 10*3/uL (ref 1.7–7.7)
Neutrophils Relative %: 56 % (ref 43–77)
PLATELETS: 236 10*3/uL (ref 150–400)
RBC: 4.58 MIL/uL (ref 3.87–5.11)
RDW: 15.3 % (ref 11.5–15.5)
WBC: 12.3 10*3/uL — ABNORMAL HIGH (ref 4.0–10.5)

## 2013-09-30 LAB — COMPREHENSIVE METABOLIC PANEL
ALK PHOS: 62 U/L (ref 39–117)
ALT: 93 U/L — ABNORMAL HIGH (ref 0–35)
AST: 63 U/L — ABNORMAL HIGH (ref 0–37)
Albumin: 4.3 g/dL (ref 3.5–5.2)
BUN: 16 mg/dL (ref 6–23)
CO2: 24 mEq/L (ref 19–32)
CREATININE: 0.65 mg/dL (ref 0.50–1.10)
Calcium: 10 mg/dL (ref 8.4–10.5)
Chloride: 100 mEq/L (ref 96–112)
GFR calc non Af Amer: >90 mL/min (ref >90)
GLUCOSE: 119 mg/dL — AB (ref 70–99)
POTASSIUM: 3.7 meq/L (ref 3.7–5.3)
Sodium: 139 mEq/L (ref 137–147)
TOTAL PROTEIN: 9.7 g/dL — AB (ref 6.0–8.3)
Total Bilirubin: 0.5 mg/dL (ref 0.3–1.2)

## 2013-09-30 LAB — RAPID URINE DRUG SCREEN, HOSP PERFORMED
Amphetamines: NOT DETECTED
BARBITURATES: NOT DETECTED
Benzodiazepines: POSITIVE — AB
Cocaine: POSITIVE — AB
Opiates: POSITIVE — AB
TETRAHYDROCANNABINOL: NOT DETECTED

## 2013-09-30 LAB — PROTIME-INR
INR: 1 (ref 0.00–1.49)
PROTHROMBIN TIME: 13 s (ref 11.6–15.2)

## 2013-09-30 LAB — TROPONIN I: Troponin I: <0.3 ng/mL (ref <0.30)

## 2013-09-30 MED ORDER — HYDROMORPHONE HCL PF 1 MG/ML IJ SOLN
0.5000 mg | Freq: Once | INTRAMUSCULAR | Status: AC
  Administered 2013-09-30: 0.5 mg via INTRAVENOUS
  Filled 2013-09-30: qty 1

## 2013-09-30 MED ORDER — ONDANSETRON HCL 4 MG/2ML IJ SOLN
4.0000 mg | Freq: Once | INTRAMUSCULAR | Status: AC
  Administered 2013-09-30: 4 mg via INTRAVENOUS
  Filled 2013-09-30: qty 2

## 2013-09-30 MED ORDER — SUCRALFATE 1 G PO TABS: 1.0000 g | ORAL_TABLET | Freq: Three times a day (TID) | ORAL | Status: DC

## 2013-09-30 MED ORDER — SODIUM CHLORIDE 0.9 % IV BOLUS (SEPSIS)
1000.0000 mL | Freq: Once | INTRAVENOUS | Status: AC
  Administered 2013-09-30: 1000 mL via INTRAVENOUS

## 2013-09-30 MED ORDER — PROMETHAZINE HCL 25 MG PO TABS: 25.0000 mg | ORAL_TABLET | Freq: Four times a day (QID) | ORAL | Status: DC | PRN

## 2013-09-30 MED ORDER — PANTOPRAZOLE SODIUM 40 MG IV SOLR
40.0000 mg | Freq: Once | INTRAVENOUS | Status: AC
  Administered 2013-09-30: 40 mg via INTRAVENOUS
  Filled 2013-09-30: qty 40

## 2013-09-30 NOTE — ED Provider Notes (Signed)
CSN: [id number]     Arrival date & time 09/30/13  1401 History  This chart was scribed for Maudry Diego, MD by Jenne Campus, ED Scribe. This patient was seen in room APA01/APA01 and the patient's care was started at 3:16 PM.    Chief Complaint  Patient presents with  . Chest Pain    Patient is a 43 y.o. female presenting with vomiting. The history is provided by the patient. No language interpreter was used.  Emesis Severity:  Mild Duration: this morning. Number of daily episodes:  2 Emesis appearance: dark clotted blood. Chronicity:  Recurrent (once before) Associated symptoms: no abdominal pain, no diarrhea and no headaches     HPI Comments: [name] is a 43 y.o. female who presents to the Emergency Department complaining of 2 episodes of hematemesis described as dark clotted blood that began this morning with associated central CP and melena that started yesterday. She denies any non-bloody emesis prior to the onset of the hematemesis. She also states that she has been having issues with swallowing. She reports prior episodes of the same that required hospitalization with diagnosis of esophageal irritation. She denies a varices dx.    GI is Dr. Oneida Alar.   Past Medical History  Diagnosis Date  . Hypertension   . Depressed   . Drug addiction Hep C  . Hepatitis C     pt denies any treatment  . Gastroparesis   . Gastroesophageal reflux disease   . Polysubstance dependence including opioid type drug, episodic abuse   . Cancer   . Cervical cancer   . Renal disorder     kidney stones   Past Surgical History  Procedure Laterality Date  . Appendectomy    . Tubal ligation    . Mandible surgery for fracture    . Esophagogastroduodenoscopy  01/04/2008    Dr Lamar Laundry reflux esophagitis, sm HH, gastritis, BRAVO positive for GERD on BID PPI   Family History  Problem Relation Age of Onset  . Colon cancer Neg Hx   . Liver disease Neg Hx    History   Substance Use Topics  . Smoking status: Current Every Day Smoker -- 1.00 packs/day for 15 years    Types: Cigarettes  . Smokeless tobacco: Not on file  . Alcohol Use: Yes     Comment: occ   No OB history provided.  Review of Systems  Constitutional: Negative for appetite change and fatigue.  HENT: Negative for congestion, ear discharge and sinus pressure.   Eyes: Negative for discharge.  Respiratory: Negative for cough.   Cardiovascular: Positive for chest pain.  Gastrointestinal: Positive for vomiting and blood in stool (melena since yesterday). Negative for abdominal pain and diarrhea.  Genitourinary: Negative for frequency and hematuria.  Musculoskeletal: Negative for back pain.  Skin: Negative for rash.  Neurological: Negative for seizures and headaches.  Psychiatric/Behavioral: Negative for hallucinations.      Allergies  Tramadol hcl; Ketorolac tromethamine; and Propoxyphene napsylate  Home Medications   Current Outpatient Rx  Name  Route  Sig  Dispense  Refill  . aspirin EC 81 MG tablet   Oral   Take 81 mg by mouth daily.         . clonazePAM (KLONOPIN) 1 MG tablet   Oral   Take 1 mg by mouth 3 (three) times daily.         . cloNIDine (CATAPRES-TTS-2) 0.2 mg/24hr patch   Transdermal   Place 1 patch (0.2 mg total)  onto the skin once a week.   7 patch   0   . cyclobenzaprine (FLEXERIL) 10 MG tablet   Oral   Take 10 mg by mouth 3 (three) times daily as needed for muscle spasms.         Marland Kitchen gabapentin (NEURONTIN) 300 MG capsule   Oral   Take 300 mg by mouth 2 (two) times daily.         Marland Kitchen HYDROcodone-acetaminophen (NORCO) 7.5-325 MG per tablet   Oral   Take 1 tablet by mouth every 6 (six) hours as needed for moderate pain.         Marland Kitchen lisinopril-hydrochlorothiazide (PRINZIDE,ZESTORETIC) 20-12.5 MG per tablet   Oral   Take 1 tablet by mouth daily.         . pantoprazole (PROTONIX) 40 MG tablet   Oral   Take 1 tablet (40 mg total) by mouth 2  (two) times daily.   30 tablet   0   . promethazine (PHENERGAN) 25 MG tablet   Oral   Take 1 tablet (25 mg total) by mouth every 6 (six) hours as needed for nausea or vomiting.   20 tablet   0   . ranitidine (ZANTAC) 150 MG tablet   Oral   Take 150 mg by mouth 2 (two) times daily.         Marland Kitchen sulfamethoxazole-trimethoprim (BACTRIM DS) 800-160 MG per tablet   Oral   Take 1 tablet by mouth 2 (two) times daily.   20 tablet   0    Triage vitals: BP 175/113  Pulse 101  Temp(Src) 97.5 F (36.4 C) (Oral)  Resp 18  Ht 5\' 2"  (1.575 m)  Wt 178 lb (80.74 kg)  BMI 32.55 kg/m2  SpO2 99%  LMP 09/26/2013  Physical Exam  Nursing note and vitals reviewed. Constitutional: She is oriented to person, place, and time. She appears well-developed and well-nourished.  HENT:  Head: Normocephalic and atraumatic.  Eyes: Conjunctivae and EOM are normal. No scleral icterus.  Neck: Neck supple. No thyromegaly present.  Cardiovascular: Normal rate and regular rhythm.  Exam reveals no gallop and no friction rub.   No murmur heard. Pulmonary/Chest: Effort normal and breath sounds normal. No stridor. She has no wheezes. She has no rales. She exhibits no tenderness.  Abdominal: Soft. There is tenderness (moderate epigastric). There is no rebound.  Musculoskeletal: Normal range of motion. She exhibits no edema.  Lymphadenopathy:    She has no cervical adenopathy.  Neurological: She is alert and oriented to person, place, and time. She exhibits normal muscle tone. Coordination normal.  Skin: Skin is warm and dry. No rash noted. No erythema.  Psychiatric: She has a normal mood and affect. Her behavior is normal.    ED Course  Procedures (including critical care time)  Medications  pantoprazole (PROTONIX) injection 40 mg (not administered)  ondansetron (ZOFRAN) injection 4 mg (not administered)  HYDROmorphone (DILAUDID) injection 0.5 mg (not administered)    DIAGNOSTIC STUDIES: Oxygen  Saturation is 99% on RA, normal by my interpretation.    COORDINATION OF CARE: 3:19 PM-Discussed treatment plan which includes medications, CXR, CMP, CBC and troponin with pt at bedside and pt agreed to plan.   Labs Review Labs Reviewed  CBC WITH DIFFERENTIAL  COMPREHENSIVE METABOLIC PANEL  TROPONIN I   Imaging Review Dg Chest 2 View  09/30/2013   CLINICAL DATA:  Chest pain  EXAM: CHEST  2 VIEW  COMPARISON:  May 26, 2013  FINDINGS: Lungs  are clear. Heart size and pulmonary vascularity are normal. No pneumothorax. No adenopathy. No bone lesions.  IMPRESSION: No abnormality noted.   Electronically Signed   By: Lowella Grip M.D.   On: 09/30/2013 14:54     EKG Interpretation   Date/Time:  Thursday September 30 2013 14:10:26 EDT Ventricular Rate:  95 PR Interval:  126 QRS Duration: 94 QT Interval:  394 QTC Calculation: 495 R Axis:   47 Text Interpretation:  Normal sinus rhythm Prolonged QT Abnormal ECG When  compared with ECG of 26-May-2013 23:55, No significant change was found  Confirmed by Ondine Gemme  MD, Kelina Beauchamp [phone]) on 09/30/2013 3:15:10 PM      MDM   Final diagnoses:  None   The chart was scribed for me under my direct supervision.  I personally performed the history, physical, and medical decision making and all procedures in the evaluation of this patient.Maudry Diego, MD 09/30/13 [phone]

## 2013-09-30 NOTE — ED Notes (Signed)
Complain of chest pain in center of chest that started this morning. Also, complain if vomiting dark blood

## 2013-09-30 NOTE — Discharge Instructions (Signed)
Follow up with dr. Fields next week °

## 2013-10-01 NOTE — Telephone Encounter (Signed)
PT SEEN IN ED MAR 12.URINE DRUG SCREEN POS FOR COCAINE. PT WILL BE DISCHARGED FROM THE PRACTICE.

## 2013-10-04 ENCOUNTER — Encounter: Payer: Self-pay | Admitting: General Practice

## 2013-10-04 NOTE — Telephone Encounter (Signed)
Letter mailed

## 2014-03-18 ENCOUNTER — Emergency Department (HOSPITAL_COMMUNITY): Payer: Medicaid Other

## 2014-03-18 ENCOUNTER — Emergency Department (HOSPITAL_COMMUNITY)
Admission: EM | Admit: 2014-03-18 | Discharge: 2014-03-18 | Disposition: A | Discharge status: Home / Self Care | Payer: Medicaid Other | Attending: Emergency Medicine | Admitting: Emergency Medicine

## 2014-03-18 ENCOUNTER — Encounter (HOSPITAL_COMMUNITY): Payer: Self-pay | Admitting: Emergency Medicine

## 2014-03-18 DIAGNOSIS — F17200 Nicotine dependence, unspecified, uncomplicated: Secondary | ICD-10-CM | POA: Insufficient documentation

## 2014-03-18 DIAGNOSIS — F329 Major depressive disorder, single episode, unspecified: Secondary | ICD-10-CM | POA: Diagnosis not present

## 2014-03-18 DIAGNOSIS — Z8589 Personal history of malignant neoplasm of other organs and systems: Secondary | ICD-10-CM | POA: Diagnosis not present

## 2014-03-18 DIAGNOSIS — Z8619 Personal history of other infectious and parasitic diseases: Secondary | ICD-10-CM | POA: Insufficient documentation

## 2014-03-18 DIAGNOSIS — F3289 Other specified depressive episodes: Secondary | ICD-10-CM | POA: Diagnosis not present

## 2014-03-18 DIAGNOSIS — Y939 Activity, unspecified: Secondary | ICD-10-CM | POA: Insufficient documentation

## 2014-03-18 DIAGNOSIS — Z7982 Long term (current) use of aspirin: Secondary | ICD-10-CM | POA: Diagnosis not present

## 2014-03-18 DIAGNOSIS — I10 Essential (primary) hypertension: Secondary | ICD-10-CM | POA: Diagnosis not present

## 2014-03-18 DIAGNOSIS — M79609 Pain in unspecified limb: Secondary | ICD-10-CM | POA: Insufficient documentation

## 2014-03-18 DIAGNOSIS — Z8719 Personal history of other diseases of the digestive system: Secondary | ICD-10-CM | POA: Insufficient documentation

## 2014-03-18 DIAGNOSIS — Z79899 Other long term (current) drug therapy: Secondary | ICD-10-CM | POA: Insufficient documentation

## 2014-03-18 DIAGNOSIS — M7121 Synovial cyst of popliteal space [Baker], right knee: Secondary | ICD-10-CM

## 2014-03-18 DIAGNOSIS — IMO0002 Reserved for concepts with insufficient information to code with codable children: Secondary | ICD-10-CM | POA: Diagnosis not present

## 2014-03-18 DIAGNOSIS — M7120 Synovial cyst of popliteal space [Baker], unspecified knee: Secondary | ICD-10-CM | POA: Diagnosis not present

## 2014-03-18 DIAGNOSIS — C801 Malignant (primary) neoplasm, unspecified: Secondary | ICD-10-CM | POA: Insufficient documentation

## 2014-03-18 DIAGNOSIS — Y929 Unspecified place or not applicable: Secondary | ICD-10-CM | POA: Insufficient documentation

## 2014-03-18 DIAGNOSIS — S86911A Strain of unspecified muscle(s) and tendon(s) at lower leg level, right leg, initial encounter: Secondary | ICD-10-CM

## 2014-03-18 DIAGNOSIS — X58XXXA Exposure to other specified factors, initial encounter: Secondary | ICD-10-CM | POA: Insufficient documentation

## 2014-03-18 MED ORDER — HYDROCODONE-ACETAMINOPHEN 5-325 MG PO TABS: ORAL_TABLET | ORAL | Status: DC

## 2014-03-18 MED ORDER — HYDROCODONE-ACETAMINOPHEN 5-325 MG PO TABS
1.0000 | ORAL_TABLET | Freq: Once | ORAL | Status: AC
  Administered 2014-03-18: 1 via ORAL
  Filled 2014-03-18: qty 1

## 2014-03-18 NOTE — ED Notes (Signed)
Patient seen and assessed by [facility]

## 2014-03-18 NOTE — ED Provider Notes (Signed)
CSN: [id number]     Arrival date & time 03/18/14  1554 History   First MD Initiated Contact with Patient 03/18/14 1624     Chief Complaint  Patient presents with  . Leg Pain     (Consider location/radiation/quality/duration/timing/severity/associated sxs/prior Treatment) HPI   [name] is a 43 y.o. female who presents to the Emergency Department complaining of pain to her right lower leg.  She states the pain has been present for one week and she feels " a knot" to the back of her knee.  Pain is worse with weight bearing and improves at rest.  She denies known injury, redness, skin lesions, swelling or numbness of her leg.     Past Medical History  Diagnosis Date  . Hypertension   . Depressed   . Drug addiction Hep C  . Hepatitis C     pt denies any treatment  . Gastroparesis   . Gastroesophageal reflux disease   . Polysubstance dependence including opioid type drug, episodic abuse   . Cancer   . Cervical cancer   . Renal disorder     kidney stones   Past Surgical History  Procedure Laterality Date  . Appendectomy    . Tubal ligation    . Mandible surgery for fracture    . Esophagogastroduodenoscopy  01/04/2008    Dr Lamar Laundry reflux esophagitis, sm HH, gastritis, BRAVO positive for GERD on BID PPI   Family History  Problem Relation Age of Onset  . Colon cancer Neg Hx   . Liver disease Neg Hx    History  Substance Use Topics  . Smoking status: Current Every Day Smoker -- 1.00 packs/day for 15 years    Types: Cigarettes  . Smokeless tobacco: Not on file  . Alcohol Use: Yes     Comment: occ   OB History   Grav Para Term Preterm Abortions TAB SAB Ect Mult Living                 Review of Systems  Constitutional: Negative for fever and chills.  Respiratory: Negative for shortness of breath.   Cardiovascular: Negative for chest pain.  Genitourinary: Negative for dysuria and difficulty urinating.  Musculoskeletal: Positive for arthralgias and  joint swelling.  Skin: Negative for color change, rash and wound.  Neurological: Negative for dizziness, weakness and numbness.  All other systems reviewed and are negative.     Allergies  Tramadol hcl; Ketorolac tromethamine; and Propoxyphene napsylate  Home Medications   Prior to Admission medications   Medication Sig Start Date End Date Taking? Authorizing Provider  aspirin EC 81 MG tablet Take 81 mg by mouth 2 (two) times a week.    Yes Historical Provider, MD  clonazePAM (KLONOPIN) 1 MG tablet Take 1 mg by mouth 3 (three) times daily.   Yes Historical Provider, MD  gabapentin (NEURONTIN) 300 MG capsule Take 300 mg by mouth 3 (three) times daily.    Yes Historical Provider, MD  lisinopril-hydrochlorothiazide (PRINZIDE,ZESTORETIC) 20-12.5 MG per tablet Take 1 tablet by mouth daily.   Yes Historical Provider, MD   BP 145/102  Pulse 96  Temp(Src) 99 F (37.2 C) (Oral)  Resp 18  Ht 5\' 3"  (1.6 m)  Wt 176 lb (79.833 kg)  BMI 31.18 kg/m2  SpO2 100%  LMP 01/16/2014 Physical Exam  Nursing note and vitals reviewed. Constitutional: She is oriented to person, place, and time. She appears well-developed and well-nourished. No distress.  HENT:  Head: Normocephalic and atraumatic.  Cardiovascular: Normal rate, regular rhythm, normal heart sounds and intact distal pulses.   No murmur heard. Pulmonary/Chest: Effort normal and breath sounds normal. No respiratory distress.  Musculoskeletal: Normal range of motion. She exhibits edema and tenderness.  ttp of the popliteal fossa of the right knee and tenderness is present along the medial aspect of the lower leg.  No edema or erythema, effusion, or step-off deformity.  DP pulse brisk, distal sensation intact. Compartments of the lower extremity are soft.    Neurological: She is alert and oriented to person, place, and time. She exhibits normal muscle tone. Coordination normal.  Skin: Skin is warm and dry. No erythema.    ED Course   Procedures (including critical care time) Labs Review Labs Reviewed - No data to display  Imaging Review US Venous Img Lower Unilateral Right  03/18/2014   CLINICAL DATA:  43 year old female with right lower extremity pain and palpable abnormality. Initial encounter.  EXAM: RIGHT LOWER EXTREMITY VENOUS DOPPLER ULTRASOUND  TECHNIQUE: Gray-scale sonography with graded compression, as well as color Doppler and duplex ultrasound were performed to evaluate the lower extremity deep venous systems from the level of the common femoral vein and including the common femoral, femoral, profunda femoral, popliteal and calf veins including the posterior tibial, peroneal and gastrocnemius veins when visible. The superficial great saphenous vein was also interrogated. Spectral Doppler was utilized to evaluate flow at rest and with distal augmentation maneuvers in the common femoral, femoral and popliteal veins.  COMPARISON:  Right ankle radiographs 01/19/2008.  FINDINGS: Common Femoral Vein: No evidence of thrombus. Normal compressibility, respiratory phasicity and response to augmentation.  Saphenofemoral Junction: No evidence of thrombus. Normal compressibility and flow on color Doppler imaging.  Profunda Femoral Vein: No evidence of thrombus. Normal compressibility and flow on color Doppler imaging.  Femoral Vein: No evidence of thrombus. Normal compressibility, respiratory phasicity and response to augmentation.  Popliteal Vein: No evidence of thrombus. Normal compressibility, respiratory phasicity and response to augmentation.  Calf Veins: No evidence of thrombus. Normal compressibility and flow on color Doppler imaging.  Superficial Great Saphenous Vein: No evidence of thrombus. Normal compressibility and flow on color Doppler imaging.  Venous Reflux:  None.  Other Findings: Complex multi loculated popliteal fossa fluid collection encompassing 3.3 x 1.4 x 4.0 cm, nonvascular (image 24).  IMPRESSION: 1.  No evidence of  right lower extremity deep venous thrombosis. 2. 3-4 cm multiloculated popliteal fossa fluid collection most compatible with Baker cyst.   Electronically Signed   By: Lars Pinks M.D.   On: 03/18/2014 17:11     EKG Interpretation None      MDM   Final diagnoses:  Baker cyst, right  Muscle strain, lower leg, right, initial encounter    Pt with ttp of the medial aspect of the right LE and palpable nodule to the popliteal fossa c/w Baker cyst.  [place] is negative for DVT.    Pt agrees to symptomatic treatment and orthopedic f/u.  Referral info given, Rx for vicodin     Skarlett Sedlacek L. Vanessa [place], PA-C 03/20/14 1817

## 2014-03-18 NOTE — ED Notes (Addendum)
Pain rt lower leg  For 1 week, says she feels a "knot", no injury. Swollen popliteal area

## 2014-03-18 NOTE — Discharge Instructions (Signed)
Baker Cyst °A Baker cyst is a sac-like structure that forms in the back of the knee. It is filled with the same fluid that is located in your knee. This fluid lubricates the bones and cartilage of the knee and allows them to move over each other more easily. °CAUSES  °When the knee becomes injured or inflamed, increased fluid forms in the knee. When this happens, the joint lining is pushed out behind the knee and forms the Baker cyst. This cyst may also be caused by inflammation from arthritic conditions and infections. °SIGNS AND SYMPTOMS  °A Baker cyst usually has no symptoms. When the cyst is substantially enlarged: °· You may feel pressure behind the knee, stiffness in the knee, or a mass in the area behind the knee. °· You may develop pain, redness, and swelling in the calf.  This can suggest a blood clot and requires evaluation by your health care provider. °DIAGNOSIS  °A Baker cyst is most often found during an ultrasound exam. This exam may have been performed for other reasons, and the cyst was found incidentally. Sometimes an MRI is used. This picks up other problems within a joint that an ultrasound exam may not. If the Baker cyst developed immediately after an injury, X-ray exams may be used to diagnose the cyst. °TREATMENT  °The treatment depends on the cause of the cyst. Anti-inflammatory medicines and rest often will be prescribed. If the cyst is caused by a bacterial infection, antibiotic medicines may be prescribed.  °HOME CARE INSTRUCTIONS  °· If the cyst was caused by an injury, for the first 24 hours, keep the injured leg elevated on 2 pillows while lying down. °· For the first 24 hours while you are awake, apply ice to the injured area: °¨ Put ice in a plastic bag. °¨ Place a towel between your skin and the bag. °¨ Leave the ice on for 20 minutes, 2-3 times a day. °· Only take over-the-counter or prescription medicines for pain, discomfort, or fever as directed by your health care  provider. °· Only take antibiotic medicine as directed. Make sure to finish it even if you start to feel better. °MAKE SURE YOU:  °· Understand these instructions. °· Will watch your condition. °· Will get help right away if you are not doing well or get worse. °Document Released: 07/08/2005 Document Revised: 04/28/2013 Document Reviewed: 02/17/2013 °Exit[employer]® Patient Information ©2015 [employer]. This information is not intended to replace advice given to you by your health care provider. Make sure you discuss any questions you have with your health care provider. ° °

## 2014-03-20 NOTE — ED Provider Notes (Signed)
Medical screening examination/treatment/procedure(s) were performed by non-physician practitioner and as supervising physician I was immediately available for consultation/collaboration.   EKG Interpretation None        Maudry Diego, MD 03/20/14 2220

## 2014-08-04 ENCOUNTER — Encounter (HOSPITAL_COMMUNITY): Payer: Self-pay | Admitting: Gastroenterology

## 2015-02-15 ENCOUNTER — Other Ambulatory Visit (HOSPITAL_COMMUNITY): Payer: Self-pay | Admitting: Physician Assistant

## 2015-02-15 DIAGNOSIS — Z1231 Encounter for screening mammogram for malignant neoplasm of breast: Secondary | ICD-10-CM

## 2015-02-20 ENCOUNTER — Ambulatory Visit (HOSPITAL_COMMUNITY): Payer: Medicaid Other

## 2015-02-28 ENCOUNTER — Encounter: Payer: Self-pay | Admitting: *Deleted

## 2015-03-06 ENCOUNTER — Other Ambulatory Visit: Payer: Self-pay | Admitting: Obstetrics and Gynecology

## 2015-03-06 ENCOUNTER — Ambulatory Visit (INDEPENDENT_AMBULATORY_CARE_PROVIDER_SITE_OTHER): Payer: Medicaid Other | Admitting: Obstetrics and Gynecology

## 2015-03-06 ENCOUNTER — Encounter: Payer: Self-pay | Admitting: Obstetrics and Gynecology

## 2015-03-06 ENCOUNTER — Other Ambulatory Visit (HOSPITAL_COMMUNITY)
Admission: RE | Admit: 2015-03-06 | Discharge: 2015-03-06 | Disposition: A | Discharge status: Home / Self Care | Payer: Medicaid Other | Source: Ambulatory Visit | Attending: Obstetrics and Gynecology | Admitting: Obstetrics and Gynecology

## 2015-03-06 VITALS — BP 100/60 | HR 68 | Ht 62.0 in | Wt 213.0 lb

## 2015-03-06 DIAGNOSIS — Z1151 Encounter for screening for human papillomavirus (HPV): Secondary | ICD-10-CM | POA: Diagnosis present

## 2015-03-06 DIAGNOSIS — Z01419 Encounter for gynecological examination (general) (routine) without abnormal findings: Secondary | ICD-10-CM

## 2015-03-06 DIAGNOSIS — N920 Excessive and frequent menstruation with regular cycle: Secondary | ICD-10-CM

## 2015-03-06 DIAGNOSIS — Z0000 Encounter for general adult medical examination without abnormal findings: Secondary | ICD-10-CM

## 2015-03-06 NOTE — Progress Notes (Signed)
Patient ID: [name], female   DOB: 07/13/1971, 44 y.o.   MRN: [id number]  Assessment:  Annual Gyn Exam Menorrhagia. Consider treatment with endometrial ablation. Discussed with pt.  Stress incontinence, excessive weight contributory Plan:  1. pap smear done, results available by next week 2. Schedule U/S in 1 week, will also review pap results at that time 3    Annual mammogram advised 4.   Encouraged pt to continue losing weight to decrease her stress incontinence.  Subjective:  [name] is a 44 y.o. female with a history of tubal ligation and cervical cancer, who presents for annual exam. Patient's last menstrual period was 01/20/2015.   The patient has complaints today of menorrhagia. She states her periods last for 4-8 days. The first 3 days are normal, but after that, her bleeding increases, sometimes with blood clots. She states at the end of her periods, the flow of blood appears "black." She denies any known diagnosis of anemia.menses sometimes up to 12 days  Patient also complains of stress incontinence that occurs with her chronic smoker's cough. She states she has lost 15 pounds in the last week due to "nerves." She has also been trying to lose weight by substituting sugary drinks with water. Patient states she has been a [name] smoker for 15 years.   Patient notes she is stressed and depressed lately due to dealing with issues regarding her ex-husband, child support, and raising her child.   Patient reports she had a laser procedure done for a history of abnormal cervical cells as a teen.  Patient is currently applying for Medicaid disability. Patient works a Educational psychologist.   Patient notes a brown recluse bite on her right wrist managed surgically, by Dr. Janan Halter in [place]. [facility] healing   The following portions of the patient's history were reviewed and updated as appropriate: allergies, current medications, past family history, past medical history, past social  history, past surgical history and problem list. Past Medical History  Diagnosis Date  . Hypertension   . Depressed   . Drug addiction Hep C  . Hepatitis C     pt denies any treatment  . Gastroparesis   . Gastroesophageal reflux disease   . Polysubstance dependence including opioid type drug, episodic abuse   . Cancer   . Cervical cancer   . Renal disorder     kidney stones  . Arthritis   . Anxiety   . Psoriasis     Past Surgical History  Procedure Laterality Date  . Appendectomy    . Tubal ligation    . Mandible surgery for fracture    . Esophagogastroduodenoscopy  01/04/2008    Dr Lamar Laundry reflux esophagitis, sm HH, gastritis, BRAVO positive for GERD on BID PPI  . Laser ablation condyloma cervical / vulvar       Current outpatient prescriptions:  .  clonazePAM (KLONOPIN) 1 MG tablet, Take 1 mg by mouth 3 (three) times daily., Disp: , Rfl:  .  DULoxetine (CYMBALTA) 20 MG capsule, Take 20 mg by mouth daily., Disp: , Rfl:  .  lisdexamfetamine (VYVANSE) 50 MG capsule, Take 50 mg by mouth daily., Disp: , Rfl:  .  lisinopril-hydrochlorothiazide (PRINZIDE,ZESTORETIC) 20-12.5 MG per tablet, Take 1 tablet by mouth daily., Disp: , Rfl:  .  omeprazole (PRILOSEC) 20 MG capsule, Take 20 mg by mouth daily., Disp: , Rfl:  .  aspirin EC 81 MG tablet, Take 81 mg by mouth 2 (two) times a week. , Disp: , Rfl:  .  cyclobenzaprine (FLEXERIL) 10 MG tablet, Take 10 mg by mouth 3 (three) times daily as needed for muscle spasms., Disp: , Rfl:  .  gabapentin (NEURONTIN) 300 MG capsule, Take 300 mg by mouth 3 (three) times daily. , Disp: , Rfl:  .  HYDROcodone-acetaminophen (NORCO/VICODIN) 5-325 MG per tablet, Take one-two tabs po q 4-6 hrs prn pain (Patient not taking: Reported on 03/06/2015), Disp: 15 tablet, Rfl: 0 .  meloxicam (MOBIC) 7.5 MG tablet, Take 7.5 mg by mouth daily., Disp: , Rfl:  .  ranitidine (ZANTAC) 150 MG tablet, Take 150 mg by mouth 2 (two) times daily., Disp: , Rfl:    Review of Systems Constitutional: negative Gastrointestinal: negative Genitourinary: menorrhagia A complete 10 system review of systems was obtained and all systems are negative except as noted in the HPI and PMH.    Objective:  BP 100/60 mmHg  Pulse 68  Ht 5\' 2"  (1.575 m)  Wt 213 lb (96.616 kg)  BMI 38.95 kg/m2  LMP 01/20/2015   BMI: Body mass index is 38.95 kg/(m^2).  General Appearance: Alert, appropriate appearance for age. No acute distress HEENT: Grossly normal Neck / Thyroid:  Cardiovascular: RRR; normal S1, S2, no murmur Lungs: CTA bilaterally Back: No CVAT Breast Exam: No dimpling, nipple retraction or discharge. No masses or nodes., Normal breast tissue bilaterally and No masses or nodes.No dimpling, nipple retraction or discharge. Bruising noted; per pt, this is from a mammogram she had last week. Gastrointestinal: Soft, non-tender, no masses or organomegaly Pelvic Exam: Vulva and vagina appear normal. Bimanual exam reveals normal uterus and adnexa. External genitalia: normal general appearance Urinary system: urethral meatus normal Vaginal: normal mucosa without prolapse or lesions Cervix: fibrosis. Pt reports a history of laser procedure for cervical cancer. Adnexa: normal bimanual exam Uterus: anterior, mobile, nontender, normal in size Rectal: good sphincter tone, no masses and guaiac negative Rectovaginal: Not indicated Lymphatic Exam: Non-palpable nodes in neck, clavicular, axillary, or inguinal regions  Skin: no rash or abnormalities Neurologic: Normal gait and speech, no tremor  Psychiatric: Alert and oriented, appropriate affect.  Urinalysis:Not done  Mallory Shirk. MD Pgr [phone] 11:26 AM   This chart was SCRIBED for Mallory Shirk, MD by Stephania Fragmin, ED Scribe. This patient was seen in room 1, and the patient's care was started at 11:26 AM.  I personally performed the services described in this documentation, which was SCRIBED in my presence.  The recorded information has been reviewed and considered accurate. It has been edited as necessary during review. Jonnie Kind, MD

## 2015-03-07 LAB — CYTOLOGY - PAP

## 2015-03-08 ENCOUNTER — Other Ambulatory Visit: Payer: Medicaid Other

## 2015-03-08 ENCOUNTER — Ambulatory Visit: Payer: Medicaid Other | Admitting: Obstetrics and Gynecology

## 2015-03-15 ENCOUNTER — Other Ambulatory Visit: Payer: Medicaid Other

## 2015-03-15 ENCOUNTER — Ambulatory Visit: Payer: Medicaid Other | Admitting: Obstetrics and Gynecology

## 2015-03-23 ENCOUNTER — Encounter: Payer: Self-pay | Admitting: Obstetrics and Gynecology

## 2015-03-23 ENCOUNTER — Ambulatory Visit (INDEPENDENT_AMBULATORY_CARE_PROVIDER_SITE_OTHER): Payer: Medicaid Other

## 2015-03-23 ENCOUNTER — Ambulatory Visit (INDEPENDENT_AMBULATORY_CARE_PROVIDER_SITE_OTHER): Payer: Medicaid Other | Admitting: Obstetrics and Gynecology

## 2015-03-23 VITALS — BP 120/76 | Ht 62.0 in | Wt 202.0 lb

## 2015-03-23 DIAGNOSIS — G8929 Other chronic pain: Secondary | ICD-10-CM

## 2015-03-23 DIAGNOSIS — N920 Excessive and frequent menstruation with regular cycle: Secondary | ICD-10-CM

## 2015-03-23 DIAGNOSIS — R102 Pelvic and perineal pain: Principal | ICD-10-CM

## 2015-03-23 DIAGNOSIS — N949 Unspecified condition associated with female genital organs and menstrual cycle: Secondary | ICD-10-CM

## 2015-03-23 NOTE — Progress Notes (Signed)
PELVIC TA/TV US: heterogenous anteverted uterus, .8 x .6 x .7cm complex nabothian cyst, normal ov's bilat (mobile),EEC 6.18mm,no free fluid seen,bilat pelvic pain during ultrasound

## 2015-03-23 NOTE — Progress Notes (Signed)
[facility] Visit  Patient name: [name] MRN [id number]  Date of birth: 1971/07/20  CC & HPI:  [name] is a 44 y.o. female presenting today for U/S and subsequent discussion as follow-up of chronic menorrhagia. Patient also complains of chronic suprapubic discomfort. She is not sexually active, she describes menses as not that bad. Discussion limited by pt's sedation from taking Klonipin this afternoon due to unspecified stressors. Pt has a Retail banker. ROS:  A complete review of systems was obtained and all systems are negative except as noted in the HPI and PMH.    Pertinent History Reviewed:   Reviewed: Significant for tubal ligation, Laser ablation condyloma cervical / vulvar Medical         Past Medical History  Diagnosis Date  . Hypertension   . Depressed   . Drug addiction Hep C  . Hepatitis C     pt denies any treatment  . Gastroparesis   . Gastroesophageal reflux disease   . Polysubstance dependence including opioid type drug, episodic abuse   . Cancer   . Cervical cancer   . Renal disorder     kidney stones  . Arthritis   . Anxiety   . Psoriasis                               Surgical Hx:    Past Surgical History  Procedure Laterality Date  . Appendectomy    . Tubal ligation    . Mandible surgery for fracture    . Esophagogastroduodenoscopy  01/04/2008    Dr Lamar Laundry reflux esophagitis, sm HH, gastritis, BRAVO positive for GERD on BID PPI  . Laser ablation condyloma cervical / vulvar     Medications: Reviewed & Updated - see associated section                       Current outpatient prescriptions:  .  aspirin EC 81 MG tablet, Take 81 mg by mouth 2 (two) times a week. , Disp: , Rfl:  .  clonazePAM (KLONOPIN) 1 MG tablet, Take 1 mg by mouth 3 (three) times daily., Disp: , Rfl:  .  DULoxetine (CYMBALTA) 20 MG capsule, Take 20 mg by mouth daily., Disp: , Rfl:  .  lisdexamfetamine (VYVANSE) 50 MG capsule, Take 50 mg by  mouth daily., Disp: , Rfl:  .  lisinopril-hydrochlorothiazide (PRINZIDE,ZESTORETIC) 20-12.5 MG per tablet, Take 1 tablet by mouth daily., Disp: , Rfl:  .  omeprazole (PRILOSEC) 20 MG capsule, Take 20 mg by mouth daily., Disp: , Rfl:    Social History: Reviewed -  reports that she has been smoking Cigarettes.  She has a 28 pack-year smoking history. She has never used smokeless tobacco.  Objective Findings:  Vitals: Blood pressure 120/76, height 5\' 2"  (1.575 m), weight 202 lb (91.627 kg), last menstrual period 01/20/2015.  Physical Examination:  Patient here for discussion only.    Assessment & Plan:   A:  1. U/S findings reviewed with no pt. No obvious anatomic source of symptoms visualized on exam.  2  Pt sedation limiting discussion P:  1. F/u prn 2 importance that pt NOT drive emphasized, she state she is not driving. 3  This chart was SCRIBED for Mallory Shirk, MD by Stephania Fragmin, ED Scribe. This patient was seen in my office, and the patient's care was started at 2:15 PM.  I personally performed  the services described in this documentation, which was SCRIBED in my presence. The recorded information has been reviewed and considered accurate. It has been edited as necessary during review. Jonnie Kind, MD

## 2015-03-23 NOTE — Progress Notes (Signed)
Patient ID: [name], female   DOB: 06/09/1971, 44 y.o.   MRN: [id number] Pt here today for results. Pt has had an [place] today and is to go over the results of the [place].

## 2015-08-29 DIAGNOSIS — Z8619 Personal history of other infectious and parasitic diseases: Secondary | ICD-10-CM | POA: Diagnosis not present

## 2015-08-29 DIAGNOSIS — Z859 Personal history of malignant neoplasm, unspecified: Secondary | ICD-10-CM | POA: Insufficient documentation

## 2015-08-29 DIAGNOSIS — Z7982 Long term (current) use of aspirin: Secondary | ICD-10-CM | POA: Diagnosis not present

## 2015-08-29 DIAGNOSIS — M1990 Unspecified osteoarthritis, unspecified site: Secondary | ICD-10-CM | POA: Diagnosis not present

## 2015-08-29 DIAGNOSIS — F419 Anxiety disorder, unspecified: Secondary | ICD-10-CM | POA: Insufficient documentation

## 2015-08-29 DIAGNOSIS — I10 Essential (primary) hypertension: Secondary | ICD-10-CM | POA: Diagnosis not present

## 2015-08-29 DIAGNOSIS — K219 Gastro-esophageal reflux disease without esophagitis: Secondary | ICD-10-CM | POA: Diagnosis not present

## 2015-08-29 DIAGNOSIS — Z872 Personal history of diseases of the skin and subcutaneous tissue: Secondary | ICD-10-CM | POA: Insufficient documentation

## 2015-08-29 DIAGNOSIS — J159 Unspecified bacterial pneumonia: Secondary | ICD-10-CM | POA: Diagnosis not present

## 2015-08-29 DIAGNOSIS — F17210 Nicotine dependence, cigarettes, uncomplicated: Secondary | ICD-10-CM | POA: Insufficient documentation

## 2015-08-29 DIAGNOSIS — F329 Major depressive disorder, single episode, unspecified: Secondary | ICD-10-CM | POA: Diagnosis not present

## 2015-08-29 DIAGNOSIS — C538 Malignant neoplasm of overlapping sites of cervix uteri: Secondary | ICD-10-CM | POA: Diagnosis not present

## 2015-08-29 DIAGNOSIS — R079 Chest pain, unspecified: Secondary | ICD-10-CM | POA: Diagnosis present

## 2015-08-29 DIAGNOSIS — Z87442 Personal history of urinary calculi: Secondary | ICD-10-CM | POA: Insufficient documentation

## 2015-08-29 DIAGNOSIS — Z79899 Other long term (current) drug therapy: Secondary | ICD-10-CM | POA: Insufficient documentation

## 2015-08-29 DIAGNOSIS — D649 Anemia, unspecified: Secondary | ICD-10-CM | POA: Insufficient documentation

## 2015-08-30 ENCOUNTER — Emergency Department (HOSPITAL_COMMUNITY): Payer: Medicaid Other

## 2015-08-30 ENCOUNTER — Encounter (HOSPITAL_COMMUNITY): Payer: Self-pay | Admitting: *Deleted

## 2015-08-30 ENCOUNTER — Emergency Department (HOSPITAL_COMMUNITY)
Admission: EM | Admit: 2015-08-30 | Discharge: 2015-08-30 | Disposition: A | Discharge status: Home / Self Care | Payer: Medicaid Other | Attending: Emergency Medicine | Admitting: Emergency Medicine

## 2015-08-30 DIAGNOSIS — D649 Anemia, unspecified: Secondary | ICD-10-CM

## 2015-08-30 DIAGNOSIS — J189 Pneumonia, unspecified organism: Secondary | ICD-10-CM

## 2015-08-30 LAB — CBC WITH DIFFERENTIAL/PLATELET
BASOS ABS: 0 10*3/uL (ref 0.0–0.1)
BASOS PCT: 0 %
Eosinophils Absolute: 0.3 10*3/uL (ref 0.0–0.7)
Eosinophils Relative: 3 %
HEMATOCRIT: 26.9 % — AB (ref 36.0–46.0)
HEMOGLOBIN: 8.4 g/dL — AB (ref 12.0–15.0)
Lymphocytes Relative: 24 %
Lymphs Abs: 3.1 10*3/uL (ref 0.7–4.0)
MCH: 23.7 pg — ABNORMAL LOW (ref 26.0–34.0)
MCHC: 31.2 g/dL (ref 30.0–36.0)
MCV: 76 fL — ABNORMAL LOW (ref 78.0–100.0)
Monocytes Absolute: 0.7 10*3/uL (ref 0.1–1.0)
Monocytes Relative: 6 %
NEUTROS ABS: 8.4 10*3/uL — AB (ref 1.7–7.7)
NEUTROS PCT: 67 %
Platelets: 393 10*3/uL (ref 150–400)
RBC: 3.54 MIL/uL — AB (ref 3.87–5.11)
RDW: 15.6 % — ABNORMAL HIGH (ref 11.5–15.5)
WBC: 12.6 10*3/uL — AB (ref 4.0–10.5)

## 2015-08-30 LAB — BASIC METABOLIC PANEL
ANION GAP: 10 (ref 5–15)
BUN: 13 mg/dL (ref 6–20)
CHLORIDE: 102 mmol/L (ref 101–111)
CO2: 23 mmol/L (ref 22–32)
Calcium: 8.5 mg/dL — ABNORMAL LOW (ref 8.9–10.3)
Creatinine, Ser: 0.86 mg/dL (ref 0.44–1.00)
GFR calc non Af Amer: >60 mL/min (ref >60)
Glucose, Bld: 168 mg/dL — ABNORMAL HIGH (ref 65–99)
Potassium: 3.5 mmol/L (ref 3.5–5.1)
Sodium: 135 mmol/L (ref 135–145)

## 2015-08-30 LAB — TROPONIN I

## 2015-08-30 MED ORDER — LEVOFLOXACIN 500 MG PO TABS
500.0000 mg | ORAL_TABLET | Freq: Once | ORAL | Status: AC
  Administered 2015-08-30: 500 mg via ORAL
  Filled 2015-08-30: qty 1

## 2015-08-30 MED ORDER — ACETAMINOPHEN 500 MG PO TABS
ORAL_TABLET | ORAL | Status: AC
  Filled 2015-08-30: qty 2

## 2015-08-30 MED ORDER — ACETAMINOPHEN 500 MG PO TABS
1000.0000 mg | ORAL_TABLET | Freq: Once | ORAL | Status: AC
  Administered 2015-08-30: 1000 mg via ORAL

## 2015-08-30 MED ORDER — GUAIFENESIN-CODEINE 100-10 MG/5ML PO SOLN
ORAL | Status: AC
  Filled 2015-08-30: qty 5

## 2015-08-30 MED ORDER — GUAIFENESIN-CODEINE 100-10 MG/5ML PO SOLN
5.0000 mL | Freq: Once | ORAL | Status: AC
Start: 2015-08-30 — End: 2015-08-30
  Administered 2015-08-30: 5 mL via ORAL

## 2015-08-30 MED ORDER — GUAIFENESIN-CODEINE 100-10 MG/5ML PO SOLN: 10.0000 mL | Freq: Four times a day (QID) | ORAL | Status: DC | PRN

## 2015-08-30 MED ORDER — LEVOFLOXACIN 500 MG PO TABS: 500.0000 mg | ORAL_TABLET | Freq: Every day | ORAL | Status: DC

## 2015-08-30 NOTE — Discharge Instructions (Signed)
Anemia, Nonspecific Anemia is a condition in which the concentration of red blood cells or hemoglobin in the blood is below normal. Hemoglobin is a substance in red blood cells that carries oxygen to the tissues of the body. Anemia results in not enough oxygen reaching these tissues.  CAUSES  Common causes of anemia include:   Excessive bleeding. Bleeding may be internal or external. This includes excessive bleeding from periods (in women) or from the intestine.   Poor nutrition.   Chronic kidney, thyroid, and liver disease.  Bone marrow disorders that decrease red blood cell production.  Cancer and treatments for cancer.  HIV, AIDS, and their treatments.  Spleen problems that increase red blood cell destruction.  Blood disorders.  Excess destruction of red blood cells due to infection, medicines, and autoimmune disorders. SIGNS AND SYMPTOMS   Minor weakness.   Dizziness.   Headache.  Palpitations.   Shortness of breath, especially with exercise.   Paleness.  Cold sensitivity.  Indigestion.  Nausea.  Difficulty sleeping.  Difficulty concentrating. Symptoms may occur suddenly or they may develop slowly.  DIAGNOSIS  Additional blood tests are often needed. These help your health care provider determine the best treatment. Your health care provider will check your stool for blood and look for other causes of blood loss.  TREATMENT  Treatment varies depending on the cause of the anemia. Treatment can include:   Supplements of iron, vitamin 123456, or folic acid.   Hormone medicines.   A blood transfusion. This may be needed if blood loss is severe.   Hospitalization. This may be needed if there is significant continual blood loss.   Dietary changes.  Spleen removal. HOME CARE INSTRUCTIONS Keep all follow-up appointments. It often takes many weeks to correct anemia, and having your health care provider check on your condition and your response to  treatment is very important. SEEK IMMEDIATE MEDICAL CARE IF:   You develop extreme weakness, shortness of breath, or chest pain.   You become dizzy or have trouble concentrating.  You develop heavy vaginal bleeding.   You develop a rash.   You have bloody or black, tarry stools.   You faint.   You vomit up blood.   You vomit repeatedly.   You have abdominal pain.  You have a fever or persistent symptoms for more than 2-3 days.   You have a fever and your symptoms suddenly get worse.   You are dehydrated.  MAKE SURE YOU:  Understand these instructions.  Will watch your condition.  Will get help right away if you are not doing well or get worse.   This information is not intended to replace advice given to you by your health care provider. Make sure you discuss any questions you have with your health care provider.   Document Released: 08/15/2004 Document Revised: 03/10/2013 Document Reviewed: 01/01/2013 Elsevier Interactive Patient Education 2016 [place] Pneumonia, Adult Pneumonia is an infection of the lungs. There are different types of pneumonia. One type can develop while a person is in a hospital. A different type, called community-acquired pneumonia, develops in people who are not, or have not recently been, in the hospital or other health care facility.  CAUSES Pneumonia may be caused by bacteria, viruses, or funguses. Community-acquired pneumonia is often caused by Streptococcus pneumonia bacteria. These bacteria are often passed from one person to another by breathing in droplets from the cough or sneeze of an infected person. RISK FACTORS The condition is more likely to  develop in:  People who havechronic diseases, such as chronic obstructive pulmonary disease (COPD), asthma, congestive heart failure, cystic fibrosis, diabetes, or kidney disease.  People who haveearly-stage or late-stage HIV.  People who havesickle  cell disease.  People who havehad their spleen removed (splenectomy).  People who havepoor Human resources officer.  People who havemedical conditions that increase the risk of breathing in (aspirating) secretions their own mouth and nose.   People who havea weakened immune system (immunocompromised).  People who smoke.  People whotravel to areas where pneumonia-causing germs commonly exist.  People whoare around animal habitats or animals that have pneumonia-causing germs, including birds, bats, rabbits, cats, and farm animals. SYMPTOMS Symptoms of this condition include:  Adry cough.  A wet (productive) cough.  Fever.  Sweating.  Chest pain, especially when breathing deeply or coughing.  Rapid breathing or difficulty breathing.  Shortness of breath.  Shaking chills.  Fatigue.  Muscle aches. DIAGNOSIS Your health care provider will take a medical history and perform a physical exam. You may also have other tests, including:  Imaging studies of your chest, including X-rays.  Tests to check your blood oxygen level and other blood gases.  Other tests on blood, mucus (sputum), fluid around your lungs (pleural fluid), and urine. If your pneumonia is severe, other tests may be done to identify the specific cause of your illness. TREATMENT The type of treatment that you receive depends on many factors, such as the cause of your pneumonia, the medicines you take, and other medical conditions that you have. For most adults, treatment and recovery from pneumonia may occur at home. In some cases, treatment must happen in a hospital. Treatment may include:  Antibiotic medicines, if the pneumonia was caused by bacteria.  Antiviral medicines, if the pneumonia was caused by a virus.  Medicines that are given by mouth or through an IV tube.  Oxygen.  Respiratory therapy. Although rare, treating severe pneumonia may include:  Mechanical ventilation. This is done if you are  not breathing well on your own and you cannot maintain a safe blood oxygen level.  Thoracentesis. This procedureremoves fluid around one lung or both lungs to help you breathe better. HOME CARE INSTRUCTIONS  Take over-the-counter and prescription medicines only as told by your health care provider.  Only takecough medicine if you are losing sleep. Understand that cough medicine can prevent your body's natural ability to remove mucus from your lungs.  If you were prescribed an antibiotic medicine, take it as told by your health care provider. Do not stop taking the antibiotic even if you start to feel better.  Sleep in a semi-upright position at night. Try sleeping in a reclining chair, or place a few pillows under your head.  Do not use tobacco products, including cigarettes, chewing tobacco, and e-cigarettes. If you need help quitting, ask your health care provider.  Drink enough water to keep your urine clear or pale yellow. This will help to thin out mucus secretions in your lungs. PREVENTION There are ways that you can decrease your risk of developing community-acquired pneumonia. Consider getting a pneumococcal vaccine if:  You are older than 45 years of age.  You are older than 45 years of age and are undergoing cancer treatment, have chronic lung disease, or have other medical conditions that affect your immune system. Ask your health care provider if this applies to you. There are different types and schedules of pneumococcal vaccines. Ask your health care provider which vaccination option is best for  you. You may also prevent community-acquired pneumonia if you take these actions:  Get an influenza vaccine every year. Ask your health care provider which type of influenza vaccine is best for you.  Go to the dentist on a regular basis.  Wash your hands often. Use hand sanitizer if soap and water are not available. SEEK MEDICAL CARE IF:  You have a fever.  You are losing  sleep because you cannot control your cough with cough medicine. SEEK IMMEDIATE MEDICAL CARE IF:  You have worsening shortness of breath.  You have increased chest pain.  Your sickness becomes worse, especially if you are an older adult or have a weakened immune system.  You cough up blood.   This information is not intended to replace advice given to you by your health care provider. Make sure you discuss any questions you have with your health care provider.   Document Released: 07/08/2005 Document Revised: 03/29/2015 Document Reviewed: 11/02/2014 Elsevier Interactive Patient Education [employer].

## 2015-08-30 NOTE — ED Notes (Signed)
Pt c/o right sided chest pain; pt was seen at [facility] and diagnosed with pneumonia; pt states she is unable to get prescription filled cause it cost too much money

## 2015-08-30 NOTE — ED Provider Notes (Signed)
By signing my name below, I, Helane Gunther, attest that this documentation has been prepared under the direction and in the presence of [employer], DO. Electronically Signed: Helane Gunther, ED Scribe. 08/30/2015. 12:07 AM.  TIME SEEN: 12:08 AM  CHIEF COMPLAINT: Chest Pain  HPI: [name] is a 45 y.o. female with history of hypertension, hepatitis C, polysubstance abuse who presents to the Emergency Department complaining of right-sided chest pain onset that she describes as sharp in nature, soreness. Worse with coughing. Has had shortness of breath. Also had productive cough with yellow sputum. Has had chills but no fever. Pt states she was diagnosed with PNA at [facility] during an admission at the end of January 2017, but is unable to fill her prescriptions due to the high cost.  States that her mother and daughter encouraged her to come to the emergency department.  Denies history of PE or DVT. No recent surgery, fracture, trauma. Not on exogenous estrogen. She is a smoker.   ROS: See HPI Constitutional: no fever  Eyes: no drainage  ENT: no runny nose   Cardiovascular:   chest pain  Resp: SOB  GI: no vomiting GU: no dysuria Integumentary: no rash  Allergy: no hives  Musculoskeletal: no leg swelling  Neurological: no slurred speech ROS otherwise negative  PAST MEDICAL HISTORY/PAST SURGICAL HISTORY:  Past Medical History  Diagnosis Date  . Hypertension   . Depressed   . Drug addiction ([place]) Hep C  . Hepatitis C     pt denies any treatment  . Gastroparesis   . Gastroesophageal reflux disease   . Polysubstance dependence including opioid type drug, episodic abuse ([place])   . Cancer ([place])   . Cervical cancer ([place])   . Renal disorder     kidney stones  . Arthritis   . Anxiety   . Psoriasis     MEDICATIONS:  Prior to Admission medications   Medication Sig Start Date End Date Taking? Authorizing Provider  amLODipine (NORVASC) 10 MG tablet  Take 10 mg by mouth daily.   Yes Historical Provider, MD  amphetamine-dextroamphetamine (ADDERALL) 20 MG tablet Take 20 mg by mouth 2 (two) times daily.   Yes Historical Provider, MD  aspirin EC 81 MG tablet Take 81 mg by mouth 2 (two) times a week.     Historical Provider, MD  clonazePAM (KLONOPIN) 1 MG tablet Take 1 mg by mouth 3 (three) times daily.    Historical Provider, MD  DULoxetine (CYMBALTA) 20 MG capsule Take 20 mg by mouth daily.    Historical Provider, MD  lisinopril-hydrochlorothiazide (PRINZIDE,ZESTORETIC) 20-12.5 MG per tablet Take 1 tablet by mouth daily.    Historical Provider, MD  omeprazole (PRILOSEC) 20 MG capsule Take 20 mg by mouth daily.    Historical Provider, MD    ALLERGIES:  Allergies  Allergen Reactions  . Tramadol Hcl Anaphylaxis and Swelling    Of tongue  . Ketorolac Tromethamine Nausea And Vomiting  . Propoxyphene Napsylate Rash    SOCIAL HISTORY:  Social History  Substance Use Topics  . Smoking status: Current Every Day Smoker -- 1.00 packs/day for 28 years    Types: Cigarettes  . Smokeless tobacco: Never Used  . Alcohol Use: No     Comment: occ    FAMILY HISTORY: Family History  Problem Relation Age of Onset  . Colon cancer Neg Hx   . Liver disease Neg Hx   . Hyperlipidemia Mother   . Diabetes Mother   .  Hypertension Mother   . Hypertension Sister   . Hepatitis C Sister   . Hypertension Father     EXAM: BP 120/78 mmHg  Pulse 96  Temp(Src) 98.3 F (36.8 C)  Resp 18  Ht 5\' 2"  (1.575 m)  Wt 175 lb 6.4 oz (79.561 kg)  BMI 32.07 kg/m2  SpO2 99%  LMP 01/20/2015 CONSTITUTIONAL: Alert and oriented and responds appropriately to questions; chronically ill-appearing, appears older than stated age, afebrile and nontoxic HEAD: Normocephalic EYES: Conjunctivae clear, PERRL ENT: normal nose; no rhinorrhea; moist mucous membranes; pharynx without lesions noted NECK: Supple, no meningismus, no LAD  CARD: RRR; S1 and S2 appreciated; no murmurs,  no clicks, no rubs, no gallops CHEST:  Slightly tender to palpation over the right chest wall without crepitus, ecchymosis or deformity RESP: Normal chest excursion without splinting or tachypnea; breath sounds clear and equal bilaterally; no wheezes, no rhonchi, no rales, no hypoxia or respiratory distress, speaking full sentences ABD/GI: Normal bowel sounds; non-distended; soft, non-tender, no rebound, no guarding, no peritoneal signs BACK:  The back appears normal and is non-tender to palpation, there is no CVA tenderness EXT: Normal ROM in all joints; non-tender to palpation; no edema; normal capillary refill; no cyanosis, no calf tenderness or swelling    SKIN: Normal color for age and race; warm NEURO: Moves all extremities equally, sensation to light touch intact diffusely, cranial nerves II through XII intact PSYCH: The patient's mood and manner are appropriate. Grooming and personal hygiene are appropriate.  MEDICAL DECISION MAKING: Patient here with complaints of right-sided chest pain. Was recently diagnosed with a right-sided pneumonia but has not been on antibiotics. She reports chills, productive cough. I suspect that this is pain from her pneumonia. We'll obtain outside records from [facility]. Will obtain labs, chest x-ray.  ED PROGRESS: Outside hospital labs show patient had a CT scan of her chest which showed no pulmonary embolus. She did have a nodular masslike density of the left lung apex measuring 15 mm which she reports she was aware of. There are bilateral ground glass opacities and areas of interstitial thickening and pleural-based thickening in both lung bases that could be inflammatory versus infectious. She also had a VQ scan that was low probability for PE. Patient had 3 negative troponins.  On review of her records on discharge it does not appear patient was discharged on any by Rx. She is discharged with ibuprofen for costochondritis and recommended to follow-up with  her primary care provider. They also appear to discuss at length the importance of close follow-up for possible lung mass.   Today patient's chest x-ray shows what appears to be a right basilar opacity concerning for pneumonia. She does have a mild leukocytosis of 12.6 with left shift. Her hemoglobin is 8.4. It appears at [facility] her hemoglobin was 10.5 on January 30. She denies any recent bloody stools, melena, vaginal bleeding. She is not sure if she has ever been anemic before. I do not feel she needs a blood transfusion but have recommended close outpatient follow-up for this. She does have primary care provider. Her troponin is negative. She is hemodynamically stable and she is not hypoxic. She has been given a dose of guaifenesin with codeine for pain control the emergency department which seems to have helped her. We'll discharge with prescription for the same that can be administered by her daughter are mother who are at bedside given her history of IV drug abuse. Patient is refusing NSAIDs. We'll discharge with  prescription for Levaquin 500 mg once a day for 10 days. She was given a dose in the emergency department. Have also provided her with a prescription coupon that would make this prescription $13.18 at [facility]. Have advised her to quit smoking. Recommend close outpatient follow-up for her lung mass seen on CT scan at [facility]. Discussed return precautions. She verbalizes understanding and discomfort with this plan.    EKG Interpretation  Date/Time:  Wednesday August 30 2015 00:16:40 EST Ventricular Rate:  91 PR Interval:  124 QRS Duration: 94 QT Interval:  376 QTC Calculation: 463 R Axis:   64 Text Interpretation:  Sinus rhythm No significant change since last tracing Confirmed by Arless Vineyard,  DO, Sangeeta Youse [id number]) on 08/30/2015 2:19:25 AM        I personally performed the services described in this documentation, which was scribed in my presence. The recorded information has  been reviewed and is accurate.    Mo[place], DO 08/30/15 [phone]

## 2016-05-14 MED FILL — ZUBSOLV 5.7-1.4 MG TAB SL: 5.7-1.4 | 7 days supply | Qty: 14 | Fill #0

## 2016-05-21 MED FILL — ZUBSOLV 5.7-1.4 MG TAB SL: 5.7-1.4 | 7 days supply | Qty: 14 | Fill #0

## 2016-07-08 MED FILL — SUBOXONE 8 MG-2 MG SL FILM: 8-2 | 10 days supply | Qty: 25 | Fill #0

## 2016-07-18 MED FILL — SUBOXONE 8 MG-2 MG SL FILM: 8-2 | 7 days supply | Qty: 18 | Fill #0

## 2016-07-25 MED FILL — SUBOXONE 8 MG-2 MG SL FILM: 8-2 | 7 days supply | Qty: 18 | Fill #0

## 2016-07-30 MED FILL — SUBOXONE 8 MG-2 MG SL FILM: 8-2 | 8 days supply | Qty: 18 | Fill #0

## 2016-08-06 MED FILL — SUBOXONE 8 MG-2 MG SL FILM: 8-2 | 7 days supply | Qty: 18 | Fill #0

## 2016-08-13 MED FILL — SUBOXONE 8 MG-2 MG SL FILM: 8-2 | 7 days supply | Qty: 21 | Fill #0

## 2016-08-20 MED FILL — SUBOXONE 8 MG-2 MG SL FILM: 8-2 | 7 days supply | Qty: 21 | Fill #0

## 2016-08-27 MED FILL — SUBOXONE 8 MG-2 MG SL FILM: 8-2 | 14 days supply | Qty: 42 | Fill #0

## 2016-09-10 MED FILL — SUBOXONE 8 MG-2 MG SL FILM: 8-2 | 7 days supply | Qty: 21 | Fill #0

## 2016-09-17 MED FILL — SUBOXONE 8 MG-2 MG SL FILM: 8-2 | 7 days supply | Qty: 21 | Fill #0

## 2016-10-25 ENCOUNTER — Emergency Department (HOSPITAL_COMMUNITY)
Admission: EM | Admit: 2016-10-25 | Discharge: 2016-10-25 | Disposition: A | Discharge status: Home / Self Care | Payer: Medicaid Other | Attending: Emergency Medicine | Admitting: Emergency Medicine

## 2016-10-25 ENCOUNTER — Encounter (HOSPITAL_COMMUNITY): Payer: Self-pay

## 2016-10-25 DIAGNOSIS — I10 Essential (primary) hypertension: Secondary | ICD-10-CM | POA: Diagnosis not present

## 2016-10-25 DIAGNOSIS — Z79899 Other long term (current) drug therapy: Secondary | ICD-10-CM | POA: Insufficient documentation

## 2016-10-25 DIAGNOSIS — F419 Anxiety disorder, unspecified: Secondary | ICD-10-CM | POA: Diagnosis present

## 2016-10-25 DIAGNOSIS — Z7982 Long term (current) use of aspirin: Secondary | ICD-10-CM | POA: Insufficient documentation

## 2016-10-25 DIAGNOSIS — F1019 Alcohol abuse with unspecified alcohol-induced disorder: Secondary | ICD-10-CM | POA: Insufficient documentation

## 2016-10-25 DIAGNOSIS — F17210 Nicotine dependence, cigarettes, uncomplicated: Secondary | ICD-10-CM | POA: Diagnosis not present

## 2016-10-25 DIAGNOSIS — Z8541 Personal history of malignant neoplasm of cervix uteri: Secondary | ICD-10-CM | POA: Insufficient documentation

## 2016-10-25 DIAGNOSIS — F1910 Other psychoactive substance abuse, uncomplicated: Secondary | ICD-10-CM

## 2016-10-25 LAB — CBC WITH DIFFERENTIAL/PLATELET
BASOS ABS: 0 10*3/uL (ref 0.0–0.1)
Basophils Relative: 0 %
EOS ABS: 0 10*3/uL (ref 0.0–0.7)
Eosinophils Relative: 0 %
HEMATOCRIT: 42.5 % (ref 36.0–46.0)
HEMOGLOBIN: 14.4 g/dL (ref 12.0–15.0)
Lymphocytes Relative: 30 %
Lymphs Abs: 3 10*3/uL (ref 0.7–4.0)
MCH: 28 pg (ref 26.0–34.0)
MCHC: 33.9 g/dL (ref 30.0–36.0)
MCV: 82.5 fL (ref 78.0–100.0)
Monocytes Absolute: 0.5 10*3/uL (ref 0.1–1.0)
Monocytes Relative: 5 %
NEUTROS ABS: 6.4 10*3/uL (ref 1.7–7.7)
NEUTROS PCT: 65 %
Platelets: 316 10*3/uL (ref 150–400)
RBC: 5.15 MIL/uL — AB (ref 3.87–5.11)
RDW: 15.1 % (ref 11.5–15.5)
WBC: 9.9 10*3/uL (ref 4.0–10.5)

## 2016-10-25 LAB — COMPREHENSIVE METABOLIC PANEL
ALK PHOS: 62 U/L (ref 38–126)
ALT: 21 U/L (ref 14–54)
ANION GAP: 12 (ref 5–15)
AST: 23 U/L (ref 15–41)
Albumin: 4.5 g/dL (ref 3.5–5.0)
BUN: 18 mg/dL (ref 6–20)
CALCIUM: 10.2 mg/dL (ref 8.9–10.3)
CO2: 25 mmol/L (ref 22–32)
CREATININE: 0.8 mg/dL (ref 0.44–1.00)
Chloride: 102 mmol/L (ref 101–111)
Glucose, Bld: 109 mg/dL — ABNORMAL HIGH (ref 65–99)
Potassium: 3.6 mmol/L (ref 3.5–5.1)
Sodium: 139 mmol/L (ref 135–145)
TOTAL PROTEIN: 9.2 g/dL — AB (ref 6.5–8.1)
Total Bilirubin: 0.5 mg/dL (ref 0.3–1.2)

## 2016-10-25 MED ORDER — AMLODIPINE BESYLATE 10 MG PO TABS: 10.0000 mg | ORAL_TABLET | Freq: Every day | ORAL | 1 refills | Status: DC

## 2016-10-25 MED ORDER — AMLODIPINE BESYLATE 5 MG PO TABS
10.0000 mg | ORAL_TABLET | Freq: Once | ORAL | Status: AC
  Administered 2016-10-25: 10 mg via ORAL
  Filled 2016-10-25: qty 2

## 2016-10-25 MED ORDER — HYDRALAZINE HCL 25 MG PO TABS
50.0000 mg | ORAL_TABLET | Freq: Once | ORAL | Status: AC
  Administered 2016-10-25: 50 mg via ORAL
  Filled 2016-10-25: qty 2

## 2016-10-25 NOTE — ED Triage Notes (Signed)
Pt reports that she has been out of her Norvasc for over a week. Checking blood pressure at home and readings as high as 202/137 last night Pt feels very anxious and SOB. Out of nerve medication.

## 2016-10-25 NOTE — ED Provider Notes (Signed)
[facility] DEPT Provider Note   CSN: [id number] Arrival date & time: 10/25/16  1408     History   Chief Complaint Chief Complaint  Patient presents with  . Hypertension    HPI [name] is a 46 y.o. female.  HPI  The pt is a 46 y/o female - she has a hx of Drug Abuse (has had problems in the past and has recently in the last month started using IV Heroin again).  She reports that she had stopped taking her BP meds in the last 5 days as well as her Xanax / Klonopin (she is unsure which?) 5 days ago as well.  She presents b/c she states she is more anxious and when she saw her BP was up she decided to get checked out.  She denies CP, fevers, vomiting, diarrhea, back pain, swelling or rashes.  She did use cocaine last night but states she does not regularly use cocaine.  Her BP has been elevated today, constant, anxiety makes worse - no meds prior to arrival.  Past Medical History:  Diagnosis Date  . Anxiety   . Arthritis   . Cancer ([place])   . Cervical cancer ([place])   . Depressed   . Drug addiction ([place]) Hep C  . Gastroesophageal reflux disease   . Gastroparesis   . Hepatitis C    pt denies any treatment  . Hypertension   . Polysubstance dependence including opioid type drug, episodic abuse ([place])   . Psoriasis   . Renal disorder    kidney stones    Patient Active Problem List   Diagnosis Date Noted  . Chronic pelvic pain in female 03/23/2015  . Cancer ([place])   . Hematemesis 05/27/2013  . Urinary tract infection, site not specified 09/10/2012  . Polysubstance dependence including opioid type drug, episodic abuse ([place]) 09/10/2012  . Gram-negative bacteremia 09/07/2012  . Acute renal failure ([place]) 09/07/2012  . Anemia 09/06/2012  . Hypokalemia 09/06/2012  . Blood in stool 09/06/2012  . Tobacco abuse 08/05/2012  . H/O intravenous drug use in remission 08/05/2012  . Hyponatremia 08/05/2012  . 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 GASTROPARESIS 09/23/2008    . NECK PAIN 11/12/2007  . HYPERLIPIDEMIA 07/10/2007  . ANXIETY 07/10/2007  . DEPRESSION 07/10/2007  . HYPERTENSION 07/10/2007  . GERD 07/10/2007  . PEPTIC ULCER DISEASE 07/10/2007  . UNSPECIFIED DISORDER OF UTERUS 07/10/2007  . PSORIASIS 07/10/2007  . ARTHRITIS 07/10/2007  . LOW BACK PAIN 07/10/2007  . WEIGHT GAIN, ABNORMAL 07/10/2007  . COUGH 07/10/2007  . CERVICAL CANCER, HX OF 07/10/2007  . MIGRAINES, HX OF 07/10/2007    Past Surgical History:  Procedure Laterality Date  . APPENDECTOMY    . ESOPHAGOGASTRODUODENOSCOPY  01/04/2008   Dr Lamar Laundry reflux esophagitis, sm HH, gastritis, BRAVO positive for GERD on BID PPI  . LASER ABLATION CONDYLOMA CERVICAL / VULVAR    . mandible surgery for fracture    . TUBAL LIGATION      OB History    No data available       Home Medications    Prior to Admission medications   Medication Sig Start Date End Date Taking? Authorizing Provider  amphetamine-dextroamphetamine (ADDERALL) 20 MG tablet Take 20 mg by mouth 2 (two) times daily.   Yes Historical Provider, MD  aspirin EC 81 MG tablet Take 81 mg by mouth 2 (two) times a week.    Yes Historical Provider, MD  Aspirin-Acetaminophen-Caffeine (GOODYS EXTRA STRENGTH PO) Take 1 Package  by mouth daily as needed (pain).   Yes Historical Provider, MD  clonazePAM (KLONOPIN) 1 MG tablet Take 1 mg by mouth 3 (three) times daily.   Yes Historical Provider, MD  DULoxetine (CYMBALTA) 20 MG capsule Take 20 mg by mouth daily.   Yes Historical Provider, MD  gabapentin (NEURONTIN) 300 MG capsule Take 1-3 capsules by mouth 3 (three) times daily. Take 1 capsule in the morning and noon then 3 capsules at night. 10/09/16  Yes Historical Provider, MD  omeprazole (PRILOSEC) 20 MG capsule Take 20 mg by mouth daily.   Yes Historical Provider, MD  amLODipine (NORVASC) 10 MG tablet Take 1 tablet (10 mg total) by mouth daily. 10/25/16   Noemi Chapel, MD    Family History Family History  Problem Relation Age  of Onset  . Hyperlipidemia Mother   . Diabetes Mother   . Hypertension Mother   . Hypertension Sister   . Hepatitis C Sister   . Hypertension Father   . Colon cancer Neg Hx   . Liver disease Neg Hx     Social History Social History  Substance Use Topics  . Smoking status: Current Every Day Smoker    Packs/day: 1.00    Years: 28.00    Types: Cigarettes  . Smokeless tobacco: Never Used  . Alcohol use No     Comment: occ     Allergies   Tramadol hcl; Ketorolac tromethamine; and Propoxyphene napsylate   Review of Systems Review of Systems  Constitutional: Negative for chills and fever.  HENT: Negative for sore throat.   Eyes: Negative for visual disturbance.  Respiratory: Positive for shortness of breath ( occasional). Negative for cough.   Cardiovascular: Negative for chest pain.  Gastrointestinal: Negative for abdominal pain, diarrhea, nausea and vomiting.  Genitourinary: Negative for dysuria and frequency.  Musculoskeletal: Negative for back pain and neck pain.  Skin: Negative for rash.  Neurological: Negative for weakness, numbness and headaches.  Hematological: Negative for adenopathy.  Psychiatric/Behavioral: Negative for behavioral problems.       Anxiety      Physical Exam Updated Vital Signs BP (!) 194/113   Pulse 85   Temp 98.8 F (37.1 C) (Temporal)   Resp 17   Ht 5\' 3"  (1.6 m)   Wt 160 lb (72.6 kg)   LMP 01/20/2015   SpO2 91%   BMI 28.34 kg/m   Physical Exam  Constitutional: She appears well-developed and well-nourished. No distress.  HENT:  Head: Normocephalic and atraumatic.  Mouth/Throat: Oropharynx is clear and moist. No oropharyngeal exudate.  OP clear, MMM  Eyes: Conjunctivae and EOM are normal. Pupils are equal, round, and reactive to light. Right eye exhibits no discharge. Left eye exhibits no discharge. No scleral icterus.  Neck: Normal range of motion. Neck supple. No JVD present. No thyromegaly present.  Cardiovascular: Normal  rate, regular rhythm, normal heart sounds and intact distal pulses.  Exam reveals no gallop and no friction rub.   No murmur heard. No JVD, edema and normal arterial pulses at the radial arteries.  Has no mumurs and no tachycardia.  Pulmonary/Chest: Effort normal and breath sounds normal. No respiratory distress. She has no wheezes. She has no rales.  Clear lung sounds - no distress, no increased WOB, no tachypnea and no accessory muscle use - speaks in full sentences  Abdominal: Soft. Bowel sounds are normal. She exhibits no distension and no mass. There is no tenderness.  No redness / masses or swelling to the abdomen or  abdominal wall.  Musculoskeletal: Normal range of motion. She exhibits no edema or tenderness.  Has poor venous access due to multiple track marks on bilateral arms, legs.  Lymphadenopathy:    She has no cervical adenopathy.  Neurological: She is alert. Coordination normal.  Clear mentation - follows commands without difficulty  Skin: Skin is warm and dry. No rash noted. No erythema.  Track marks on arms, neck and abdomen.  Psychiatric: She has a normal mood and affect. Her behavior is normal.  Not anxious appearing, no internal stimuli Not depressed, not suicidal   Nursing note and vitals reviewed.    ED Treatments / Results  Labs (all labs ordered are listed, but only abnormal results are displayed) Labs Reviewed  COMPREHENSIVE METABOLIC PANEL - Abnormal; Notable for the following:       Result Value   Glucose, Bld 109 (*)    Total Protein 9.2 (*)    All other components within normal limits  CBC WITH DIFFERENTIAL/PLATELET - Abnormal; Notable for the following:    RBC 5.15 (*)    All other components within normal limits    EKG  EKG Interpretation  Date/Time:  Friday October 25 2016 14:41:59 EDT Ventricular Rate:  95 PR Interval:    QRS Duration: 99 QT Interval:  391 QTC Calculation: 492 R Axis:   54 Text Interpretation:  Normal sinus rhythm Minimal  ST elevation, anterior leads Borderline prolonged QT interval Since last tracing No significant change was found Confirmed by [place] Luppino  MD, Hosie Sharman ([id number]) on 10/25/2016 3:05:51 PM       Radiology No results found.  Procedures Procedures (including critical care time)  Medications Ordered in ED Medications  amLODipine (NORVASC) tablet 10 mg (10 mg Oral Given 10/25/16 1504)  hydrALAZINE (APRESOLINE) tablet 50 mg (50 mg Oral Given 10/25/16 1512)     Initial Impression / Assessment and Plan / ED Course  I have reviewed the triage vital signs and the nursing notes.  Pertinent labs & imaging results that were available during my care of the patient were reviewed by me and considered in my medical decision making (see chart for details).  The pt's blood pressure is elevated at 200/113 on my exam - she has no symptoms of concern though I suspect her elevation in BP is somewhat related to her drug use (cocaine) as well as non compliance with meds and anxiety.  I do not feel comfortable giving this patient with a thick history of substance abuse any anxiety medicines.  I think she needs to start back on her home regimen and slowly bring this level down.  Her ECG is reassuring in that there is no ischemia with ST changes or T wave abnormalities.  [facility] - she will be given resource list for her substance abuse.  Labs reassuring - BP improved Home on norvasc Counseled on drug use and f/u Pt agreeable.   Final Clinical Impressions(s) / ED Diagnoses   Final diagnoses:  Essential hypertension  Anxiety  Substance abuse    New Prescriptions Current Discharge Medication List       Noemi Chapel, MD 10/25/16 1606

## 2016-10-25 NOTE — Discharge Instructions (Signed)
YOU WILL NEED TO TAKE YOUR NORVASC ONCE DAILY AND HAVE YOUR DOCTOR RECHECK YOUR BLOOD PRESSURE IN NEXT WEEK   ER FOR WORSENING SYMTPOMS.  Please obtain all of your results from medical records or have your doctors office obtain the results - share them with your doctor - you should be seen at your doctors office in the next 2 days. Call today to arrange your follow up. Take the medications as prescribed. Please review all of the medicines and only take them if you do not have an allergy to them. Please be aware that if you are taking birth control pills, taking other prescriptions, ESPECIALLY ANTIBIOTICS may make the birth control ineffective - if this is the case, either do not engage in sexual activity or use alternative methods of birth control such as condoms until you have finished the medicine and your family doctor says it is OK to restart them. If you are on a blood thinner such as COUMADIN, be aware that any other medicine that you take may cause the coumadin to either work too much, or not enough - you should have your coumadin level rechecked in next 7 days if this is the case.  ?  It is also a possibility that you have an allergic reaction to any of the medicines that you have been prescribed - Everybody reacts differently to medications and while MOST people have no trouble with most medicines, you may have a reaction such as nausea, vomiting, rash, swelling, shortness of breath. If this is the case, please stop taking the medicine immediately and contact your physician.  ?  You should return to the ER if you develop severe or worsening symptoms.  Substance Abuse Treatment Programs  Intensive Outpatient Programs [facility]     [address], [place]       [facility] [address] [place], [place]  [facility]     (Inpatient and outpatient)     [address]           [place] [phone] (Suboxone and Methadone)  [place], [place] [address]      [place] [address] [place], [place]  [facility] (Outpatient/Inpatient, [place])    (insurance only) [phone]             [facility] ([place]) [place], [place]     [facility]     [address]     [place], [place]       [facility] (for caregivers and family) [phone] [address] [place]een [place]. [place], [place]      Residential Treatment Programs [facility]      [address], [place], [place] [address]  [phone]       [facility] [address], [place], [place] [address] [phone]  [facility] [place]        [phone]       [facility] [phone]  [facility] ([place]             [address]  [place], [place] or [place], [address] [phone]  [facility]    [address]      [place], [place]       [facility] [address] [place], [place]  [place]   [address] [place], [place] [address]     [phone]      Admissions: 8am-3pm M-F  [facility] ([facility]) [address] [place], [place]  [facility] Program: Residential Program [phone] Days)   [place], [place] or [phone]     [facility]: [place], [place] (Walk in Hours over the weekend or by referral)  [facility] [place], [place], [place] [address] [phone]  Crisis Mobile: Therapeutic  Alternatives:  [phone] (for crisis response 24 hours a day) [facility] Hotline:      [phone] Outpatient Psychiatry and Counseling  [facility] Altern[facility]: Mobile Crisis Management 24 hours:  [phone]  [facility] of the [employer] sliding scale fee and walk in schedule: M-F 8am-12pm/1pm-3pm [place], [place] [address] [place], [place] [address] [phone]  [facility] (Formerly known as [facility] [employer])- new patient walk-in appointments available Monday - Friday 8am -3pm.          [address] [place], [place] [address] [phone] or crisis line- [facility]/ Intensive Outpatient Therapy Program [place], [place] [address] [place]      [phone] [address] [place], [place] [address]                 [place]   [facility] [phone]. [address] [place], [place] [address]   [facility] [employer] [facility] of Care          [address] Johnette Abraham  [place], [place] [address]       [phone]  [facility] [address] [place], [place] [address] [phone]  [facility]    [address] [place], [place] [address]     [place], [place] Joycelyn Man     [place] [address]     [phone]       [facility] [place] [address]  [facility]     [address], [place], [place]  [place], [place] [address] [place]     [address]     [place], [place]  [address]     [phone]       [facility] [address] [place], [place] [address] [phone] Resources for Temporary Residential Assistance/Crisis [facility]) M-F 8am-3pm   [address] [place], [place] [address]   [phone] Services include: laundry, barbering, support groups, case management, phone  & computer access, showers, AA/NA mtgs, mental health/substance abuse nurse, job skills class, disability information, VA assistance, spiritual classes, etc.   [facility]   [address] [place]     [place]              [employer] (women and children)       [place]. [place], [place] [address] [phone] [email or web address] for application and process Application Required  [facility]   [address]    [place] [address]     [phone]                    [place], [place] [address] [phone]([email or web address] application appt.) Application Required  [facility] (women only)    [address]     [place], [place] [address]     [phone]      Intake starts 6pm daily Need valid ID, SSC, & Police report [employer] [address] [place], [place] [phone] Application Required  [employer] (men only)     [place].      [place], [place]       [place] (Pregnant women only) [address] [place], [place]  [facility]      [place].      [place], [place] [address]     [phone]             [facility] [address] [place], [place] 90 day commitment/SA/Application process  [facility](men only)     [address]     [place],  [place]       Check-in at [facility] of [facility] [address] [place],  [address] [phone] Men/Women/Women and Children must be there by 7 pm  [place], [place]

## 2017-01-13 ENCOUNTER — Other Ambulatory Visit: Payer: Medicaid Other | Admitting: Obstetrics and Gynecology

## 2017-10-02 ENCOUNTER — Inpatient Hospital Stay (HOSPITAL_COMMUNITY)
Admission: EM | Admit: 2017-10-02 | Discharge: 2017-10-06 | DRG: 580 | Disposition: A | Discharge status: Home / Self Care | Payer: Self-pay | Attending: Internal Medicine | Admitting: Internal Medicine

## 2017-10-02 ENCOUNTER — Other Ambulatory Visit: Payer: Self-pay

## 2017-10-02 ENCOUNTER — Emergency Department (HOSPITAL_COMMUNITY): Payer: Medicaid Other

## 2017-10-02 ENCOUNTER — Encounter (HOSPITAL_COMMUNITY): Payer: Self-pay | Admitting: Emergency Medicine

## 2017-10-02 DIAGNOSIS — F419 Anxiety disorder, unspecified: Secondary | ICD-10-CM | POA: Diagnosis present

## 2017-10-02 DIAGNOSIS — L03116 Cellulitis of left lower limb: Principal | ICD-10-CM | POA: Diagnosis present

## 2017-10-02 DIAGNOSIS — Z8249 Family history of ischemic heart disease and other diseases of the circulatory system: Secondary | ICD-10-CM

## 2017-10-02 DIAGNOSIS — L02612 Cutaneous abscess of left foot: Secondary | ICD-10-CM | POA: Diagnosis present

## 2017-10-02 DIAGNOSIS — Z8541 Personal history of malignant neoplasm of cervix uteri: Secondary | ICD-10-CM

## 2017-10-02 DIAGNOSIS — F1120 Opioid dependence, uncomplicated: Secondary | ICD-10-CM | POA: Diagnosis present

## 2017-10-02 DIAGNOSIS — L02416 Cutaneous abscess of left lower limb: Secondary | ICD-10-CM

## 2017-10-02 DIAGNOSIS — L409 Psoriasis, unspecified: Secondary | ICD-10-CM | POA: Diagnosis present

## 2017-10-02 DIAGNOSIS — L408 Other psoriasis: Secondary | ICD-10-CM | POA: Diagnosis present

## 2017-10-02 DIAGNOSIS — E876 Hypokalemia: Secondary | ICD-10-CM | POA: Diagnosis present

## 2017-10-02 DIAGNOSIS — F1420 Cocaine dependence, uncomplicated: Secondary | ICD-10-CM | POA: Diagnosis present

## 2017-10-02 DIAGNOSIS — K219 Gastro-esophageal reflux disease without esophagitis: Secondary | ICD-10-CM | POA: Diagnosis present

## 2017-10-02 DIAGNOSIS — Z833 Family history of diabetes mellitus: Secondary | ICD-10-CM

## 2017-10-02 DIAGNOSIS — F17210 Nicotine dependence, cigarettes, uncomplicated: Secondary | ICD-10-CM | POA: Diagnosis present

## 2017-10-02 DIAGNOSIS — B9562 Methicillin resistant Staphylococcus aureus infection as the cause of diseases classified elsewhere: Secondary | ICD-10-CM | POA: Diagnosis present

## 2017-10-02 DIAGNOSIS — F1920 Other psychoactive substance dependence, uncomplicated: Secondary | ICD-10-CM

## 2017-10-02 DIAGNOSIS — Z7982 Long term (current) use of aspirin: Secondary | ICD-10-CM

## 2017-10-02 DIAGNOSIS — I10 Essential (primary) hypertension: Secondary | ICD-10-CM | POA: Diagnosis present

## 2017-10-02 DIAGNOSIS — L03119 Cellulitis of unspecified part of limb: Secondary | ICD-10-CM

## 2017-10-02 DIAGNOSIS — L02619 Cutaneous abscess of unspecified foot: Secondary | ICD-10-CM | POA: Diagnosis present

## 2017-10-02 LAB — URINALYSIS, ROUTINE W REFLEX MICROSCOPIC
Bilirubin Urine: NEGATIVE
Glucose, UA: NEGATIVE mg/dL
Hgb urine dipstick: NEGATIVE
Ketones, ur: NEGATIVE mg/dL
Nitrite: NEGATIVE
Protein, ur: 30 mg/dL — AB
Specific Gravity, Urine: 1.019 (ref 1.005–1.030)
pH: 6 (ref 5.0–8.0)

## 2017-10-02 LAB — CBC WITH DIFFERENTIAL/PLATELET
BASOS ABS: 0 10*3/uL (ref 0.0–0.1)
BASOS PCT: 0 %
EOS PCT: 3 %
Eosinophils Absolute: 0.3 10*3/uL (ref 0.0–0.7)
HCT: 39 % (ref 36.0–46.0)
Hemoglobin: 12.6 g/dL (ref 12.0–15.0)
LYMPHS PCT: 26 %
Lymphs Abs: 2.8 10*3/uL (ref 0.7–4.0)
MCH: 28.5 pg (ref 26.0–34.0)
MCHC: 32.3 g/dL (ref 30.0–36.0)
MCV: 88.2 fL (ref 78.0–100.0)
MONO ABS: 0.8 10*3/uL (ref 0.1–1.0)
MONOS PCT: 7 %
Neutro Abs: 7.1 10*3/uL (ref 1.7–7.7)
Neutrophils Relative %: 64 %
PLATELETS: 263 10*3/uL (ref 150–400)
RBC: 4.42 MIL/uL (ref 3.87–5.11)
RDW: 13.6 % (ref 11.5–15.5)
WBC: 11 10*3/uL — ABNORMAL HIGH (ref 4.0–10.5)

## 2017-10-02 LAB — COMPREHENSIVE METABOLIC PANEL
ALT: 27 U/L (ref 14–54)
AST: 23 U/L (ref 15–41)
Albumin: 3.6 g/dL (ref 3.5–5.0)
Alkaline Phosphatase: 60 U/L (ref 38–126)
Anion gap: 11 (ref 5–15)
BUN: 16 mg/dL (ref 6–20)
CO2: 25 mmol/L (ref 22–32)
Calcium: 9.3 mg/dL (ref 8.9–10.3)
Chloride: 100 mmol/L — ABNORMAL LOW (ref 101–111)
Creatinine, Ser: 0.84 mg/dL (ref 0.44–1.00)
GFR calc Af Amer: >60 mL/min (ref >60)
GFR calc non Af Amer: >60 mL/min (ref >60)
Glucose, Bld: 126 mg/dL — ABNORMAL HIGH (ref 65–99)
Potassium: 3.1 mmol/L — ABNORMAL LOW (ref 3.5–5.1)
Sodium: 136 mmol/L (ref 135–145)
Total Bilirubin: 0.7 mg/dL (ref 0.3–1.2)
Total Protein: 8 g/dL (ref 6.5–8.1)

## 2017-10-02 LAB — RAPID URINE DRUG SCREEN, HOSP PERFORMED
AMPHETAMINES: POSITIVE — AB
BENZODIAZEPINES: POSITIVE — AB
Barbiturates: NOT DETECTED
COCAINE: POSITIVE — AB
OPIATES: POSITIVE — AB
TETRAHYDROCANNABINOL: POSITIVE — AB

## 2017-10-02 LAB — I-STAT CG4 LACTIC ACID, ED: Lactic Acid, Venous: 1.17 mmol/L (ref 0.5–1.9)

## 2017-10-02 MED ORDER — MORPHINE SULFATE (PF) 4 MG/ML IV SOLN
2.0000 mg | Freq: Once | INTRAVENOUS | Status: AC
Start: 2017-10-02 — End: 2017-10-02
  Administered 2017-10-02: 2 mg via INTRAVENOUS
  Filled 2017-10-02: qty 1

## 2017-10-02 MED ORDER — ONDANSETRON HCL 4 MG/2ML IJ SOLN
4.0000 mg | Freq: Four times a day (QID) | INTRAMUSCULAR | Status: DC | PRN
Start: 2017-10-02 — End: 2017-10-03

## 2017-10-02 MED ORDER — SODIUM CHLORIDE 0.9 % IV SOLN
1000.0000 mL | INTRAVENOUS | Status: DC
  Administered 2017-10-02: 1000 mL via INTRAVENOUS

## 2017-10-02 MED ORDER — VANCOMYCIN HCL IN DEXTROSE 1-5 GM/200ML-% IV SOLN: 1000.0000 mg | Freq: Once | INTRAVENOUS | Status: DC

## 2017-10-02 MED ORDER — VANCOMYCIN HCL 10 G IV SOLR
1500.0000 mg | Freq: Once | INTRAVENOUS | Status: AC
  Administered 2017-10-02: 1500 mg via INTRAVENOUS
  Filled 2017-10-02: qty 1500

## 2017-10-02 MED ORDER — VANCOMYCIN HCL IN DEXTROSE 750-5 MG/150ML-% IV SOLN
750.0000 mg | Freq: Two times a day (BID) | INTRAVENOUS | Status: DC
  Administered 2017-10-03 – 2017-10-05 (×6): 750 mg via INTRAVENOUS
  Filled 2017-10-02 (×7): qty 150

## 2017-10-02 MED ORDER — SODIUM CHLORIDE 0.9 % IV BOLUS (SEPSIS)
1000.0000 mL | Freq: Once | INTRAVENOUS | Status: AC
  Administered 2017-10-02: 1000 mL via INTRAVENOUS

## 2017-10-02 MED ORDER — MORPHINE SULFATE (PF) 4 MG/ML IV SOLN
4.0000 mg | INTRAVENOUS | Status: DC | PRN
  Administered 2017-10-03 – 2017-10-06 (×27): 4 mg via INTRAVENOUS
  Filled 2017-10-02 (×27): qty 1

## 2017-10-02 MED ORDER — ALBUTEROL SULFATE (2.5 MG/3ML) 0.083% IN NEBU: 3.0000 mL | INHALATION_SOLUTION | Freq: Four times a day (QID) | RESPIRATORY_TRACT | Status: DC | PRN

## 2017-10-02 MED ORDER — OXYCODONE-ACETAMINOPHEN 5-325 MG PO TABS
2.0000 | ORAL_TABLET | Freq: Once | ORAL | Status: AC
  Administered 2017-10-02: 2 via ORAL
  Filled 2017-10-02: qty 2

## 2017-10-02 MED ORDER — HYDROMORPHONE HCL 1 MG/ML IJ SOLN
1.0000 mg | Freq: Once | INTRAMUSCULAR | Status: AC
  Administered 2017-10-02: 1 mg via INTRAVENOUS
  Filled 2017-10-02: qty 1

## 2017-10-02 MED ORDER — PIPERACILLIN-TAZOBACTAM 3.375 G IVPB 30 MIN
3.3750 g | Freq: Once | INTRAVENOUS | Status: AC
  Administered 2017-10-02: 3.375 g via INTRAVENOUS
  Filled 2017-10-02: qty 50

## 2017-10-02 MED ORDER — PIPERACILLIN-TAZOBACTAM 3.375 G IVPB
3.3750 g | Freq: Three times a day (TID) | INTRAVENOUS | Status: DC
  Administered 2017-10-03 – 2017-10-06 (×10): 3.375 g via INTRAVENOUS
  Filled 2017-10-02 (×13): qty 50

## 2017-10-02 MED ORDER — POTASSIUM CHLORIDE IN NACL 20-0.9 MEQ/L-% IV SOLN
INTRAVENOUS | Status: DC
  Administered 2017-10-03: 01:00:00 via INTRAVENOUS
  Filled 2017-10-02: qty 1000

## 2017-10-02 MED ORDER — ONDANSETRON HCL 4 MG PO TABS: 4.0000 mg | ORAL_TABLET | Freq: Four times a day (QID) | ORAL | Status: DC | PRN

## 2017-10-02 NOTE — ED Notes (Signed)
Pt states that she was shooting up cocaine 4 days ago in her right foot (becauses she does not have any veins left) she missed the vein which is why her foot is abscessed like it is.

## 2017-10-02 NOTE — ED Notes (Signed)
Chest xray cancelled per dr Sabra Heck he stated that he did not want a chest xray due to the source of infection in her foot.

## 2017-10-02 NOTE — ED Notes (Signed)
Pt wanting IV out so she can go outside to smoke.  Primary nurse had advised pt she could not do this and would be signing out AMA by doing so.  Pt had already tried to sneak outside with her IV in place to smoke.  Attempted to reason with pt regarding going outside and why this was not allowed.  Pt tearful and talking on the phone with someone telling them to come get her.  Advised pt by leaving she was risking losing her foot which pt acknowledged.  Pt then became angry when I asked what her decision would be.  Began cursing me to the person on the phone and told me she didn't know what she was going to do but she was still sitting there so obviously her mind wasn't made up yet.  Security outside of room.

## 2017-10-02 NOTE — ED Triage Notes (Signed)
Pt c/o of abscess to top of left foot for 3 days. No drainage.

## 2017-10-02 NOTE — ED Notes (Signed)
Lab unable to get blood cultures

## 2017-10-02 NOTE — ED Notes (Signed)
Report given to [facility]. 

## 2017-10-02 NOTE — Progress Notes (Signed)
Pharmacy Antibiotic Note  [name] is a 47 y.o. female admitted on 10/02/2017 with cellulitis.  Pharmacy has been consulted for vancomycin and zosyn dosing.  Plan: Vancomycin 1500 mg IV x 1 then 750 mg IV q12 hours Continue zosyn 3.375 gm IV q8 hours F/u renal function, cultures and clinical course  Height: 5\' 2"  (157.5 cm) Weight: 160 lb (72.6 kg) IBW/kg (Calculated) : 50.1  Temp (24hrs), Avg:98.2 F (36.8 C), Min:98.2 F (36.8 C), Max:98.2 F (36.8 C)  No results for input(s): WBC, CREATININE, LATICACIDVEN, VANCOTROUGH, VANCOPEAK, VANCORANDOM, GENTTROUGH, GENTPEAK, GENTRANDOM, TOBRATROUGH, TOBRAPEAK, TOBRARND, AMIKACINPEAK, AMIKACINTROU, AMIKACIN in the last 168 hours.  CrCl cannot be calculated (Patient's most recent lab result is older than the maximum 21 days allowed.).    Allergies  Allergen Reactions  . Tramadol Hcl Anaphylaxis and Swelling    Of tongue  . Ketorolac Tromethamine Nausea And Vomiting  . Propoxyphene Napsylate Rash     Thank you for allowing pharmacy to be a part of this patient's care.  Excell Seltzer [place] 10/02/2017 1:34 PM

## 2017-10-02 NOTE — ED Notes (Signed)
Patient transported to X-ray 

## 2017-10-02 NOTE — ED Provider Notes (Signed)
[facility] EMERGENCY DEPARTMENT Provider Note   CSN: [id number] Arrival date & time: 10/02/17  1129     History   Chief Complaint Chief Complaint  Patient presents with  . Abscess    HPI [name] is a 47 y.o. female.  HPI  The patient is a 47 year old female, she has a known history of IV drug abuse, hepatitis C and psoriasis.  She reports to me that she last used intravenous drugs approximately 4 months ago, she was clean for the interim however 3 days ago she decided to start using again in the top of her left foot.  She states that she missed her vein and since that time she had some pain at the area, she woke up the next morning which was yesterday with significant more swelling and redness which has progressively worsened over the last 2 days.  Today she notes that her foot is significantly swollen red tender hot and the redness is starting to spread up her leg and is now in the distal calf.  She denies having fevers, her appetite is been okay and she denies chest pain shortness of breath abdominal pain nausea vomiting or diarrhea.  She reports that the pain is severe, worse with palpation or trying to ambulate and she has been using crutches to stay off of the foot.  Past Medical History:  Diagnosis Date  . Anxiety   . Arthritis   . Cancer ([place])   . Cervical cancer ([place])   . Depressed   . Drug addiction ([place]) Hep C  . Gastroesophageal reflux disease   . Gastroparesis   . Hepatitis C    pt denies any treatment  . Hypertension   . Polysubstance dependence including opioid type drug, episodic abuse ([place])   . Psoriasis   . Renal disorder    kidney stones    Patient Active Problem List   Diagnosis Date Noted  . Chronic pelvic pain in female 03/23/2015  . Cancer ([place])   . Hematemesis 05/27/2013  . Urinary tract infection, site not specified 09/10/2012  . Polysubstance dependence including opioid type drug, episodic abuse ([place]) 09/10/2012  . Gram-negative  bacteremia 09/07/2012  . Acute renal failure ([place]) 09/07/2012  . Anemia 09/06/2012  . Hypokalemia 09/06/2012  . Blood in stool 09/06/2012  . Tobacco abuse 08/05/2012  . H/O intravenous drug use in remission 08/05/2012  . Hyponatremia 08/05/2012  . Psoas abscess, left ([place]) 08/05/2012  . GASTROPARESIS 09/23/2008  . NECK PAIN 11/12/2007  . HYPERLIPIDEMIA 07/10/2007  . ANXIETY 07/10/2007  . DEPRESSION 07/10/2007  . HYPERTENSION 07/10/2007  . GERD 07/10/2007  . PEPTIC ULCER DISEASE 07/10/2007  . UNSPECIFIED DISORDER OF UTERUS 07/10/2007  . PSORIASIS 07/10/2007  . ARTHRITIS 07/10/2007  . LOW BACK PAIN 07/10/2007  . WEIGHT GAIN, ABNORMAL 07/10/2007  . COUGH 07/10/2007  . CERVICAL CANCER, HX OF 07/10/2007  . MIGRAINES, HX OF 07/10/2007    Past Surgical History:  Procedure Laterality Date  . APPENDECTOMY    . ESOPHAGOGASTRODUODENOSCOPY  01/04/2008   Dr Lamar Laundry reflux esophagitis, sm HH, gastritis, BRAVO positive for GERD on BID PPI  . LASER ABLATION CONDYLOMA CERVICAL / VULVAR    . mandible surgery for fracture    . TUBAL LIGATION      OB History    No data available       Home Medications    Prior to Admission medications   Medication Sig Start Date End Date Taking? Authorizing Provider  albuterol (PROVENTIL HFA;VENTOLIN  HFA) 108 (90 Base) MCG/ACT inhaler Inhale 1-2 puffs into the lungs every 6 (six) hours as needed for wheezing or shortness of breath.   Yes [provider]  amLODipine (NORVASC) 10 MG tablet Take 1 tablet (10 mg total) by mouth daily. 10/25/16  Yes Noemi Chapel, MD  aspirin EC 81 MG tablet Take 81 mg by mouth 2 (two) times a week.    Yes [provider]  Aspirin-Acetaminophen-Caffeine (GOODYS EXTRA STRENGTH PO) Take 1 Package by mouth daily as needed (pain).   Yes [provider]  clonazePAM (KLONOPIN) 1 MG tablet Take 1 mg by mouth 3 (three) times daily.   Yes [provider]  ibuprofen (ADVIL,MOTRIN) 200 MG  tablet Take 800 mg by mouth every 6 (six) hours as needed for moderate pain.   Yes [provider]  oxyCODONE-acetaminophen (PERCOCET) 7.5-325 MG tablet Take 1 tablet by mouth 2 (two) times daily as needed for moderate pain or severe pain.   Yes [provider]    Family History Family History  Problem Relation Age of Onset  . Hyperlipidemia Mother   . Diabetes Mother   . Hypertension Mother   . Hypertension Sister   . Hepatitis C Sister   . Hypertension Father   . Colon cancer Neg Hx   . Liver disease Neg Hx     Social History Social History   Tobacco Use  . Smoking status: Current Every Day Smoker    Packs/day: 1.00    Years: 28.00    Pack years: 28.00    Types: Cigarettes  . Smokeless tobacco: Never Used  Substance Use Topics  . Alcohol use: No    Comment: occ  . Drug use: No    Comment: herione last week.      Allergies   Tramadol hcl; Ketorolac tromethamine; and Propoxyphene napsylate   Review of Systems Review of Systems  All other systems reviewed and are negative.    Physical Exam Updated Vital Signs BP (!) 192/134 (BP Location: Left Arm)   Pulse (!) 108   Temp 98.2 F (36.8 C) (Oral)   Resp 20   Ht 5\' 2"  (1.575 m)   Wt 72.6 kg (160 lb)   LMP 01/20/2015   SpO2 97%   BMI 29.26 kg/m   Physical Exam  Constitutional: She appears well-developed and well-nourished. No distress.  HENT:  Head: Normocephalic and atraumatic.  Mouth/Throat: Oropharynx is clear and moist. No oropharyngeal exudate.  Eyes: Conjunctivae and EOM are normal. Pupils are equal, round, and reactive to light. Right eye exhibits no discharge. Left eye exhibits no discharge. No scleral icterus.  Neck: Normal range of motion. Neck supple. No JVD present. No thyromegaly present.  Cardiovascular: Regular rhythm, normal heart sounds and intact distal pulses. Exam reveals no gallop and no friction rub.  No murmur heard. Mild tachycardia  Pulmonary/Chest: Effort  normal and breath sounds normal. No respiratory distress. She has no wheezes. She has no rales.  Abdominal: Soft. Bowel sounds are normal. She exhibits no distension and no mass. There is no tenderness.  Musculoskeletal: She exhibits edema and tenderness.  The left lower extremity has pain, redness and swelling starting in the mid foot which extends up the leg towards the knee.  The knee joints are supple without pain, the tenderness to palpation starts mid lower extremity and is significantly and severely swollen and tender about the ankle and the foot.  There is no central point of fluctuance  Lymphadenopathy:  She has no cervical adenopathy.  Neurological: She is alert. Coordination normal.  Skin: Skin is warm and dry. Rash noted. No erythema.  Psychiatric: She has a normal mood and affect. Her behavior is normal.  Nursing note and vitals reviewed.    ED Treatments / Results  Labs (all labs ordered are listed, but only abnormal results are displayed) Labs Reviewed  COMPREHENSIVE METABOLIC PANEL - Abnormal; Notable for the following components:      Result Value   Potassium 3.1 (*)    Chloride 100 (*)    Glucose, Bld 126 (*)    All other components within normal limits  CBC WITH DIFFERENTIAL/PLATELET - Abnormal; Notable for the following components:   WBC 11.0 (*)    All other components within normal limits  URINALYSIS, ROUTINE W REFLEX MICROSCOPIC - Abnormal; Notable for the following components:   APPearance CLOUDY (*)    Protein, ur 30 (*)    Leukocytes, UA TRACE (*)    Bacteria, UA RARE (*)    Squamous Epithelial / LPF 6-30 (*)    All other components within normal limits  CULTURE, BLOOD (ROUTINE X 2)  CULTURE, BLOOD (ROUTINE X 2)  I-STAT CG4 LACTIC ACID, ED  I-STAT CG4 LACTIC ACID, ED    EKG  EKG Interpretation  Date/Time:  Thursday October 02 2017 13:39:19 EDT Ventricular Rate:  91 PR Interval:    QRS Duration: 108 QT Interval:  376 QTC Calculation: 463 R  Axis:   68 Text Interpretation:  Sinus rhythm Minimal ST depression, diffuse leads Minimal ST elevation, anterior leads since last tracing no significant change Confirmed by Noemi Chapel [id number]) on 10/02/2017 6:05:52 PM       Radiology Dg Ankle Complete Left  Result Date: 10/02/2017 CLINICAL DATA:  Abscess on top of foot. EXAM: LEFT ANKLE COMPLETE - 3+ VIEW COMPARISON:  None. FINDINGS: Three-view exam left ankle shows no acute fracture. No subluxation or dislocation. No worrisome lytic or sclerotic osseous abnormality. Diffuse soft tissue swelling is evident without radiopaque soft tissue foreign body. IMPRESSION: Diffuse soft tissue swelling without underlying bony abnormality. Electronically Signed   By: Misty Stanley M.D.   On: 10/02/2017 13:55   Mr Foot Left Wo Contrast  Result Date: 10/02/2017 CLINICAL DATA:  Patient was shooting up cocaine 4 days ago and her left foot. Foot abscess. EXAM: MRI OF THE LEFT FOOT WITHOUT CONTRAST TECHNIQUE: Multiplanar, multisequence MR imaging of the was performed. No intravenous contrast was administered. COMPARISON:  Ankle radiographs 10/02/2017 FINDINGS: Limited study as the patient Bones/Joint/Cartilage No evidence of acute fracture, joint dislocation nor joint effusion. The distal tibia and fibular intact. Subchondral cystic change likely degenerative in etiology deep to the medial talar dome is noted. No marrow signal abnormality to indicate acute osteomyelitis. Ligaments Intact Muscles and Tendons Tenosynovitis of the extensor hallucis longus tendon as well as extensor tendons to the third and fourth rays. No tendon rupture is identified. Given patient's history of IV drug abuse, infectious tenosynovitis is a possibility. No evidence of pyomyositis or myositis. Soft tissues 18 x 11 x 9 mm dorsal soft tissue fluid collection compatible with a subcutaneous abscess overlying the dorsum of the proximal first metatarsal. Subcutaneous edema about the included ankle  and dorsum of the foot consistent with cellulitis. IMPRESSION: 1. No marrow signal abnormality suspicious for acute osteomyelitis. Degenerative subchondral cystic change of the medial talar dome is noted at the ankle. 2. Fluid outlining extensor tendons to the toes most prominently involving the extensor hallucis  longus and to a lesser degree the extensor tendons to the third and fourth rays. Given history of intravenous injections 2 foot, the possibility of infected tenosynovitis is raised. Over the dorsum of the first metatarsal is a subcutaneous fluid collection measuring 18 x 11 x 9 mm that may represent a small subcutaneous abscess or iatrogenically introduced fluid. Electronically Signed   By: Ashley Royalty M.D.   On: 10/02/2017 15:20    Procedures Procedures (including critical care time)  Medications Ordered in ED Medications  0.9 %  sodium chloride infusion (1,000 mLs Intravenous New Bag/Given 10/02/17 1517)  vancomycin (VANCOCIN) 1,500 mg in sodium chloride 0.9 % 500 mL IVPB (1,500 mg Intravenous New Bag/Given 10/02/17 1541)  piperacillin-tazobactam (ZOSYN) IVPB 3.375 g (not administered)  vancomycin (VANCOCIN) IVPB 750 mg/150 ml premix (not administered)  piperacillin-tazobactam (ZOSYN) IVPB 3.375 g (3.375 g Intravenous New Bag/Given 10/02/17 1517)  sodium chloride 0.9 % bolus 1,000 mL (1,000 mLs Intravenous New Bag/Given 10/02/17 1516)  sodium chloride 0.9 % bolus 1,000 mL (1,000 mLs Intravenous New Bag/Given 10/02/17 1516)  oxyCODONE-acetaminophen (PERCOCET/ROXICET) 5-325 MG per tablet 2 tablet (2 tablets Oral Given 10/02/17 1439)  morphine 4 MG/ML injection 2 mg (2 mg Intravenous Given 10/02/17 1620)     Initial Impression / Assessment and Plan / ED Course  I have reviewed the triage vital signs and the nursing notes.  Pertinent labs & imaging results that were available during my care of the patient were reviewed by me and considered in my medical decision making (see chart for  details).    Code sepsis activated after I saw the patient at 1:25 PM, will start broad-spectrum antibiotics after cultures have been drawn.  Unfortunately the patient has severely difficult veins due to her longtime history of IV drug use.  This has limited the rapidity to which we are able to obtain blood work.  Additionally the patient will need an x-ray to rule out deep tissue infections, necrotizing fasciitis or free air.  The patient's tachycardia has resolved when she is resting, she has been given 2 L of IV fluids, vancomycin, Zosyn, blood cultures requested however at this point I do not think that the patient is septic as she has a normal lactic acid, her white blood cell count was only 11,000, she is not hypotensive.  She does have a local cellulitis.  Code sepsis was canceled.  Discussed with Dr. Netta Cedars of the orthopedic service who states that they think will likely take her to surgery tonight and wants her transported to the orthopedic center as there is no orthopedist on-call at this hospital today.  Paged hospitalist at 4:35 PM to admit, transfer and we will arrange transporation by EMS.  NPO  Discussed with Dr. Shanon Brow, she will admit the patient to the hospital, the patient will need to be transferred to higher level of care as we do not have the orthopedic surgery that is needed to continue the treatment including incision and drainage of potential ascending infection.  Final Clinical Impressions(s) / ED Diagnoses   Final diagnoses:  Cellulitis and abscess of left lower extremity      Noemi Chapel, MD 10/02/17 1807

## 2017-10-02 NOTE — H&P (Signed)
History and Physical    [name]:[id number] DOB: 1971-07-04 DOA: 10/02/2017  PCP: [facility]  Patient coming from: Home  Chief Complaint: Foot painful and swollen  HPI: [name] is a 47 y.o. female with medical history significant of IV drug abuse, psoriasis of his skin, shot up in her foot some cocaine with an IV and since then has been having swelling and redness in her left foot.  Patient denies any fevers.  Patient has a significant cellulitis and abscess to her left foot with involvement of her extender tendon tendons of her toes.  Orthopedic surgery is been called Dr. Mechele Dawley at [facility] who requests patient be transferred to [facility] for washout in the [place] at home.  Review of Systems: As per HPI otherwise 10 point review of systems negative.   Past Medical History:  Diagnosis Date  . Anxiety   . Arthritis   . Cancer ([place])   . Cervical cancer ([place])   . Depressed   . Drug addiction ([place]) Hep C  . Gastroesophageal reflux disease   . Gastroparesis   . Hepatitis C    pt denies any treatment  . Hypertension   . Polysubstance dependence including opioid type drug, episodic abuse ([place])   . Psoriasis   . Renal disorder    kidney stones    Past Surgical History:  Procedure Laterality Date  . APPENDECTOMY    . ESOPHAGOGASTRODUODENOSCOPY  01/04/2008   Dr Lamar Laundry reflux esophagitis, sm HH, gastritis, BRAVO positive for GERD on BID PPI  . LASER ABLATION CONDYLOMA CERVICAL / VULVAR    . mandible surgery for fracture    . TUBAL LIGATION       reports that she has been smoking cigarettes.  She has a 28.00 pack-year smoking history. she has never used smokeless tobacco. She reports that she does not drink alcohol or use drugs.  Allergies  Allergen Reactions  . Tramadol Hcl Anaphylaxis and Swelling    Of tongue  . Ketorolac Tromethamine Nausea And Vomiting  . Propoxyphene Napsylate Rash    Family History    Problem Relation Age of Onset  . Hyperlipidemia Mother   . Diabetes Mother   . Hypertension Mother   . Hypertension Sister   . Hepatitis C Sister   . Hypertension Father   . Colon cancer Neg Hx   . Liver disease Neg Hx     Prior to Admission medications   Medication Sig Start Date End Date Taking? Authorizing Provider  albuterol (PROVENTIL HFA;VENTOLIN HFA) 108 (90 Base) MCG/ACT inhaler Inhale 1-2 puffs into the lungs every 6 (six) hours as needed for wheezing or shortness of breath.   Yes [provider]  amLODipine (NORVASC) 10 MG tablet Take 1 tablet (10 mg total) by mouth daily. 10/25/16  Yes Noemi Chapel, MD  aspirin EC 81 MG tablet Take 81 mg by mouth 2 (two) times a week.    Yes [provider]  Aspirin-Acetaminophen-Caffeine (GOODYS EXTRA STRENGTH PO) Take 1 Package by mouth daily as needed (pain).   Yes [provider]  clonazePAM (KLONOPIN) 1 MG tablet Take 1 mg by mouth 3 (three) times daily.   Yes [provider]  ibuprofen (ADVIL,MOTRIN) 200 MG tablet Take 800 mg by mouth every 6 (six) hours as needed for moderate pain.   Yes [provider]  oxyCODONE-acetaminophen (PERCOCET) 7.5-325 MG tablet Take 1 tablet by mouth 2 (two) times daily as needed for  moderate pain or severe pain.   Yes [provider]    Physical Exam: Vitals:   10/02/17 1133 10/02/17 1642  BP: (!) 192/134 (!) 150/96  Pulse: (!) 108   Resp: 20   Temp: 98.2 F (36.8 C)   TempSrc: Oral   SpO2: 97%   Weight: 72.6 kg (160 lb)   Height: 5\' 2"  (1.575 m)       Constitutional: NAD, calm, comfortable Vitals:   10/02/17 1133 10/02/17 1642  BP: (!) 192/134 (!) 150/96  Pulse: (!) 108   Resp: 20   Temp: 98.2 F (36.8 C)   TempSrc: Oral   SpO2: 97%   Weight: 72.6 kg (160 lb)   Height: 5\' 2"  (1.575 m)    Eyes: PERRL, lids and conjunctivae normal ENMT: Mucous membranes are moist. Posterior pharynx clear of any exudate or lesions.Normal dentition.   Neck: normal, supple, no masses, no thyromegaly Respiratory: clear to auscultation bilaterally, no wheezing, no crackles. Normal respiratory effort. No accessory muscle use.  Cardiovascular: Regular rate and rhythm, no murmurs / rubs / gallops. No extremity edema. 2+ pedal pulses. No carotid bruits.  Abdomen: no tenderness, no masses palpated. No hepatosplenomegaly. Bowel sounds positive.  Musculoskeletal: no clubbing / cyanosis. No joint deformity upper and lower extremities. Good ROM, no contractures. Normal muscle tone.  Skin: Left foot with erythema up to the ankle and abscess formation  neurologic: CN 2-12 grossly intact. Sensation intact, DTR normal. Strength 5/5 in all 4.  Psychiatric: Normal judgment and insight. Alert and oriented x 3. Normal mood.    Labs on Admission: I have personally reviewed following labs and imaging studies  CBC: Recent Labs  Lab 10/02/17 1431  WBC 11.0*  NEUTROABS 7.1  HGB 12.6  HCT 39.0  MCV 88.2  PLT 638   Basic Metabolic Panel: Recent Labs  Lab 10/02/17 1431  NA 136  K 3.1*  CL 100*  CO2 25  GLUCOSE 126*  BUN 16  CREATININE 0.84  CALCIUM 9.3   GFR: Estimated Creatinine Clearance: 78.1 mL/min (by C-G formula based on SCr of 0.84 mg/dL). Liver Function Tests: Recent Labs  Lab 10/02/17 1431  AST 23  ALT 27  ALKPHOS 60  BILITOT 0.7  PROT 8.0  ALBUMIN 3.6   No results for input(s): LIPASE, AMYLASE in the last 168 hours. No results for input(s): AMMONIA in the last 168 hours. Coagulation Profile: No results for input(s): INR, PROTIME in the last 168 hours. Cardiac Enzymes: No results for input(s): CKTOTAL, CKMB, CKMBINDEX, TROPONINI in the last 168 hours. BNP (last 3 results) No results for input(s): PROBNP in the last 8760 hours. HbA1C: No results for input(s): HGBA1C in the last 72 hours. CBG: No results for input(s): GLUCAP in the last 168 hours. Lipid Profile: No results for input(s): CHOL, HDL, LDLCALC, TRIG, CHOLHDL,  LDLDIRECT in the last 72 hours. Thyroid Function Tests: No results for input(s): TSH, T4TOTAL, FREET4, T3FREE, THYROIDAB in the last 72 hours. Anemia Panel: No results for input(s): VITAMINB12, FOLATE, FERRITIN, TIBC, IRON, RETICCTPCT in the last 72 hours. Urine analysis:    Component Value Date/Time   COLORURINE YELLOW 10/02/2017 1326   APPEARANCEUR CLOUDY (A) 10/02/2017 1326   LABSPEC 1.019 10/02/2017 1326   PHURINE 6.0 10/02/2017 1326   GLUCOSEU NEGATIVE 10/02/2017 1326   HGBUR NEGATIVE 10/02/2017 1326   BILIRUBINUR NEGATIVE 10/02/2017 1326   KETONESUR NEGATIVE 10/02/2017 1326   PROTEINUR 30 (A) 10/02/2017 1326   UROBILINOGEN 0.2 09/02/2013 1619   NITRITE  NEGATIVE 10/02/2017 1326   LEUKOCYTESUR TRACE (A) 10/02/2017 1326   Sepsis Labs: !!!!!!!!!!!!!!!!!!!!!!!!!!!!!!!!!!!!!!!!!!!! @LABRCNTIP (procalcitonin:4,lacticidven:4) )No results found for this or any previous visit (from the past 240 hour(s)).   Radiological Exams on Admission: Dg Ankle Complete Left  Result Date: 10/02/2017 CLINICAL DATA:  Abscess on top of foot. EXAM: LEFT ANKLE COMPLETE - 3+ VIEW COMPARISON:  None. FINDINGS: Three-view exam left ankle shows no acute fracture. No subluxation or dislocation. No worrisome lytic or sclerotic osseous abnormality. Diffuse soft tissue swelling is evident without radiopaque soft tissue foreign body. IMPRESSION: Diffuse soft tissue swelling without underlying bony abnormality. Electronically Signed   By: Misty Stanley M.D.   On: 10/02/2017 13:55   Mr Foot Left Wo Contrast  Result Date: 10/02/2017 CLINICAL DATA:  Patient was shooting up cocaine 4 days ago and her left foot. Foot abscess. EXAM: MRI OF THE LEFT FOOT WITHOUT CONTRAST TECHNIQUE: Multiplanar, multisequence MR imaging of the was performed. No intravenous contrast was administered. COMPARISON:  Ankle radiographs 10/02/2017 FINDINGS: Limited study as the patient Bones/Joint/Cartilage No evidence of acute fracture, joint  dislocation nor joint effusion. The distal tibia and fibular intact. Subchondral cystic change likely degenerative in etiology deep to the medial talar dome is noted. No marrow signal abnormality to indicate acute osteomyelitis. Ligaments Intact Muscles and Tendons Tenosynovitis of the extensor hallucis longus tendon as well as extensor tendons to the third and fourth rays. No tendon rupture is identified. Given patient's history of IV drug abuse, infectious tenosynovitis is a possibility. No evidence of pyomyositis or myositis. Soft tissues 18 x 11 x 9 mm dorsal soft tissue fluid collection compatible with a subcutaneous abscess overlying the dorsum of the proximal first metatarsal. Subcutaneous edema about the included ankle and dorsum of the foot consistent with cellulitis. IMPRESSION: 1. No marrow signal abnormality suspicious for acute osteomyelitis. Degenerative subchondral cystic change of the medial talar dome is noted at the ankle. 2. Fluid outlining extensor tendons to the toes most prominently involving the extensor hallucis longus and to a lesser degree the extensor tendons to the third and fourth rays. Given history of intravenous injections 2 foot, the possibility of infected tenosynovitis is raised. Over the dorsum of the first metatarsal is a subcutaneous fluid collection measuring 18 x 11 x 9 mm that may represent a small subcutaneous abscess or iatrogenically introduced fluid. Electronically Signed   By: Ashley Royalty M.D.   On: 10/02/2017 15:20    Old chart reviewed Case discussed with EDP  Assessment/Plan 47 year old female with IV drug abuse comes in with deep infection, cellulitis, abscess to left foot   Principal Problem:   Cellulitis and abscess of foot-IV vancomycin and Zosyn.  N.p.o.  Patient being transferred to [facility] for surgical evaluation and treatment tonight. Active Problems:   PSORIASIS-noted   Polysubstance dependence including opioid type drug, episodic abuse  (HCC)-patient admits to cocaine use 3 days ago.  Check stat urine drug screen.     DVT prophylaxis: SCDs    Code Status: Full Family Communication: None Disposition Plan: Per day team Consults called: Orthopedic surgery Admission status: Admission   Rodriguez Aguinaldo A MD [facility]  If 7PM-7AM, please contact night-coverage [email or web address] Password [facility]  10/02/2017, 6:45 PM

## 2017-10-02 NOTE — ED Notes (Signed)
Attempt call report to 5N, will call back.

## 2017-10-03 ENCOUNTER — Encounter (HOSPITAL_COMMUNITY): Payer: Self-pay | Admitting: *Deleted

## 2017-10-03 ENCOUNTER — Inpatient Hospital Stay (HOSPITAL_COMMUNITY): Payer: Medicaid Other | Admitting: Certified Registered"

## 2017-10-03 ENCOUNTER — Encounter (HOSPITAL_COMMUNITY)
Admission: EM | Disposition: A | Discharge status: Home / Self Care | Payer: Self-pay | Source: Home / Self Care | Attending: Internal Medicine

## 2017-10-03 DIAGNOSIS — L03119 Cellulitis of unspecified part of limb: Secondary | ICD-10-CM

## 2017-10-03 DIAGNOSIS — L02619 Cutaneous abscess of unspecified foot: Secondary | ICD-10-CM

## 2017-10-03 HISTORY — PX: IRRIGATION AND DEBRIDEMENT FOOT: SHX6602

## 2017-10-03 HISTORY — PX: I&D EXTREMITY: SHX5045

## 2017-10-03 SURGERY — IRRIGATION AND DEBRIDEMENT EXTREMITY
Anesthesia: General | Site: Foot | Laterality: Left

## 2017-10-03 MED ORDER — FENTANYL CITRATE (PF) 250 MCG/5ML IJ SOLN
INTRAMUSCULAR | Status: AC
  Filled 2017-10-03: qty 5

## 2017-10-03 MED ORDER — OXYCODONE HCL 5 MG/5ML PO SOLN: 5.0000 mg | Freq: Once | ORAL | Status: AC | PRN

## 2017-10-03 MED ORDER — ONDANSETRON HCL 4 MG/2ML IJ SOLN
INTRAMUSCULAR | Status: AC
  Filled 2017-10-03: qty 2

## 2017-10-03 MED ORDER — METHOCARBAMOL 500 MG PO TABS
ORAL_TABLET | ORAL | Status: AC
  Administered 2017-10-03: 500 mg via ORAL
  Filled 2017-10-03: qty 1

## 2017-10-03 MED ORDER — 0.9 % SODIUM CHLORIDE (POUR BTL) OPTIME
TOPICAL | Status: DC | PRN
  Administered 2017-10-03: 1000 mL

## 2017-10-03 MED ORDER — PROPOFOL 10 MG/ML IV BOLUS
INTRAVENOUS | Status: AC
  Filled 2017-10-03: qty 20

## 2017-10-03 MED ORDER — ONDANSETRON HCL 4 MG/2ML IJ SOLN
4.0000 mg | Freq: Four times a day (QID) | INTRAMUSCULAR | Status: DC | PRN
  Administered 2017-10-05: 4 mg via INTRAVENOUS
  Filled 2017-10-03: qty 2

## 2017-10-03 MED ORDER — PHENYLEPHRINE 40 MCG/ML (10ML) SYRINGE FOR IV PUSH (FOR BLOOD PRESSURE SUPPORT)
PREFILLED_SYRINGE | INTRAVENOUS | Status: AC
  Filled 2017-10-03: qty 10

## 2017-10-03 MED ORDER — AMLODIPINE BESYLATE 10 MG PO TABS
10.0000 mg | ORAL_TABLET | Freq: Every day | ORAL | Status: DC
  Administered 2017-10-03 – 2017-10-06 (×4): 10 mg via ORAL
  Filled 2017-10-03 (×4): qty 1

## 2017-10-03 MED ORDER — CEFAZOLIN SODIUM-DEXTROSE 2-4 GM/100ML-% IV SOLN
2.0000 g | INTRAVENOUS | Status: AC
  Administered 2017-10-03: 2 g via INTRAVENOUS
  Filled 2017-10-03: qty 100

## 2017-10-03 MED ORDER — DOCUSATE SODIUM 100 MG PO CAPS
100.0000 mg | ORAL_CAPSULE | Freq: Two times a day (BID) | ORAL | Status: DC
  Administered 2017-10-03 – 2017-10-06 (×6): 100 mg via ORAL
  Filled 2017-10-03 (×5): qty 1

## 2017-10-03 MED ORDER — MIDAZOLAM HCL 5 MG/5ML IJ SOLN
INTRAMUSCULAR | Status: DC | PRN
  Administered 2017-10-03: 1 mg via INTRAVENOUS

## 2017-10-03 MED ORDER — MIDAZOLAM HCL 2 MG/2ML IJ SOLN
INTRAMUSCULAR | Status: AC
  Filled 2017-10-03: qty 2

## 2017-10-03 MED ORDER — DEXAMETHASONE SODIUM PHOSPHATE 10 MG/ML IJ SOLN
INTRAMUSCULAR | Status: DC | PRN
  Administered 2017-10-03: 10 mg via INTRAVENOUS

## 2017-10-03 MED ORDER — HYDRALAZINE HCL 20 MG/ML IJ SOLN
INTRAMUSCULAR | Status: AC
  Administered 2017-10-03: 5 mg via INTRAVENOUS
  Filled 2017-10-03: qty 1

## 2017-10-03 MED ORDER — METOCLOPRAMIDE HCL 5 MG PO TABS: 5.0000 mg | ORAL_TABLET | Freq: Three times a day (TID) | ORAL | Status: DC | PRN

## 2017-10-03 MED ORDER — LIDOCAINE HCL (CARDIAC) 20 MG/ML IV SOLN
INTRAVENOUS | Status: AC
  Filled 2017-10-03: qty 5

## 2017-10-03 MED ORDER — OXYCODONE HCL 5 MG PO TABS
ORAL_TABLET | ORAL | Status: AC
  Administered 2017-10-03: 5 mg via ORAL
  Filled 2017-10-03: qty 1

## 2017-10-03 MED ORDER — BISACODYL 10 MG RE SUPP: 10.0000 mg | Freq: Every day | RECTAL | Status: DC | PRN

## 2017-10-03 MED ORDER — LACTATED RINGERS IV SOLN
INTRAVENOUS | Status: DC
Start: 2017-10-03 — End: 2017-10-06
  Administered 2017-10-03: 11:00:00 via INTRAVENOUS

## 2017-10-03 MED ORDER — HYDRALAZINE HCL 20 MG/ML IJ SOLN
5.0000 mg | Freq: Once | INTRAMUSCULAR | Status: AC
  Administered 2017-10-03 (×2): 5 mg via INTRAVENOUS

## 2017-10-03 MED ORDER — MEPERIDINE HCL 50 MG/ML IJ SOLN: 6.2500 mg | INTRAMUSCULAR | Status: DC | PRN

## 2017-10-03 MED ORDER — ARTIFICIAL TEARS OPHTHALMIC OINT
TOPICAL_OINTMENT | OPHTHALMIC | Status: AC
  Filled 2017-10-03: qty 3.5

## 2017-10-03 MED ORDER — DEXAMETHASONE SODIUM PHOSPHATE 10 MG/ML IJ SOLN
INTRAMUSCULAR | Status: AC
  Filled 2017-10-03: qty 1

## 2017-10-03 MED ORDER — POLYETHYLENE GLYCOL 3350 17 G PO PACK: 17.0000 g | PACK | Freq: Every day | ORAL | Status: DC | PRN

## 2017-10-03 MED ORDER — CLONAZEPAM 1 MG PO TABS
1.0000 mg | ORAL_TABLET | Freq: Three times a day (TID) | ORAL | Status: DC
  Administered 2017-10-03 – 2017-10-06 (×9): 1 mg via ORAL
  Filled 2017-10-03 (×9): qty 1

## 2017-10-03 MED ORDER — HYDROMORPHONE HCL 1 MG/ML IJ SOLN
INTRAMUSCULAR | Status: AC
  Administered 2017-10-03: 0.5 mg via INTRAVENOUS
  Filled 2017-10-03: qty 1

## 2017-10-03 MED ORDER — MAGNESIUM CITRATE PO SOLN: 1.0000 | Freq: Once | ORAL | Status: DC | PRN

## 2017-10-03 MED ORDER — METOPROLOL TARTRATE 5 MG/5ML IV SOLN: 5.0000 mg | Freq: Four times a day (QID) | INTRAVENOUS | Status: DC | PRN

## 2017-10-03 MED ORDER — POVIDONE-IODINE 10 % EX SWAB: 2.0000 "application " | Freq: Once | CUTANEOUS | Status: DC

## 2017-10-03 MED ORDER — METOCLOPRAMIDE HCL 5 MG/ML IJ SOLN
5.0000 mg | Freq: Three times a day (TID) | INTRAMUSCULAR | Status: DC | PRN
  Administered 2017-10-04: 10 mg via INTRAVENOUS
  Filled 2017-10-03: qty 2

## 2017-10-03 MED ORDER — ONDANSETRON HCL 4 MG/2ML IJ SOLN
INTRAMUSCULAR | Status: DC | PRN
  Administered 2017-10-03: 4 mg via INTRAVENOUS

## 2017-10-03 MED ORDER — PROMETHAZINE HCL 25 MG/ML IJ SOLN: 6.2500 mg | INTRAMUSCULAR | Status: DC | PRN

## 2017-10-03 MED ORDER — ONDANSETRON HCL 4 MG PO TABS: 4.0000 mg | ORAL_TABLET | Freq: Four times a day (QID) | ORAL | Status: DC | PRN

## 2017-10-03 MED ORDER — OXYCODONE HCL 5 MG PO TABS
5.0000 mg | ORAL_TABLET | Freq: Once | ORAL | Status: AC | PRN
  Administered 2017-10-03: 5 mg via ORAL

## 2017-10-03 MED ORDER — METHOCARBAMOL 500 MG PO TABS
500.0000 mg | ORAL_TABLET | Freq: Four times a day (QID) | ORAL | Status: DC | PRN
  Administered 2017-10-03 – 2017-10-06 (×6): 500 mg via ORAL
  Filled 2017-10-03 (×5): qty 1

## 2017-10-03 MED ORDER — POTASSIUM CHLORIDE 10 MEQ/100ML IV SOLN
10.0000 meq | INTRAVENOUS | Status: AC
  Administered 2017-10-03 (×2): 10 meq via INTRAVENOUS
  Filled 2017-10-03 (×3): qty 100

## 2017-10-03 MED ORDER — HYDROMORPHONE HCL 1 MG/ML IJ SOLN
0.2500 mg | INTRAMUSCULAR | Status: DC | PRN
  Administered 2017-10-03 (×4): 0.5 mg via INTRAVENOUS

## 2017-10-03 MED ORDER — PROPOFOL 10 MG/ML IV BOLUS
INTRAVENOUS | Status: DC | PRN
  Administered 2017-10-03: 200 mg via INTRAVENOUS

## 2017-10-03 MED ORDER — CHLORHEXIDINE GLUCONATE 4 % EX LIQD
60.0000 mL | Freq: Once | CUTANEOUS | Status: DC
  Administered 2017-10-03: 4 via TOPICAL

## 2017-10-03 MED ORDER — SODIUM CHLORIDE 0.9 % IV SOLN
INTRAVENOUS | Status: DC
  Administered 2017-10-03: 16:00:00 via INTRAVENOUS

## 2017-10-03 MED ORDER — METHOCARBAMOL 1000 MG/10ML IJ SOLN
500.0000 mg | Freq: Four times a day (QID) | INTRAVENOUS | Status: DC | PRN
  Filled 2017-10-03: qty 5

## 2017-10-03 MED ORDER — MIDAZOLAM HCL 2 MG/2ML IJ SOLN
2.0000 mg | Freq: Once | INTRAMUSCULAR | Status: AC
  Administered 2017-10-03: 2 mg via INTRAVENOUS

## 2017-10-03 MED ORDER — FENTANYL CITRATE (PF) 100 MCG/2ML IJ SOLN
INTRAMUSCULAR | Status: DC | PRN
  Administered 2017-10-03 (×2): 50 ug via INTRAVENOUS

## 2017-10-03 MED ORDER — LIDOCAINE HCL (CARDIAC) 20 MG/ML IV SOLN
INTRAVENOUS | Status: DC | PRN
  Administered 2017-10-03: 100 mg via INTRATRACHEAL

## 2017-10-03 SURGICAL SUPPLY — 37 items
BENZOIN TINCTURE PRP APPL 2/3 (GAUZE/BANDAGES/DRESSINGS) ×3 IMPLANT
BLADE SURG 21 STRL SS (BLADE) ×3 IMPLANT
BNDG COHESIVE 6X5 TAN STRL LF (GAUZE/BANDAGES/DRESSINGS) IMPLANT
BNDG GAUZE ELAST 4 BULKY (GAUZE/BANDAGES/DRESSINGS) ×6 IMPLANT
CANISTER WOUNDNEG PRESSURE 500 (CANNISTER) ×3 IMPLANT
COVER SURGICAL LIGHT HANDLE (MISCELLANEOUS) ×6 IMPLANT
DRAPE INCISE IOBAN 66X45 STRL (DRAPES) ×3 IMPLANT
DRAPE U-SHAPE 47X51 STRL (DRAPES) ×3 IMPLANT
DRESSING VERAFLO CLEANSE CC (GAUZE/BANDAGES/DRESSINGS) ×1 IMPLANT
DRSG ADAPTIC 3X8 NADH LF (GAUZE/BANDAGES/DRESSINGS) ×3 IMPLANT
DRSG VERAFLO CLEANSE CC (GAUZE/BANDAGES/DRESSINGS) ×3
DURAPREP 26ML APPLICATOR (WOUND CARE) ×3 IMPLANT
ELECT REM PT RETURN 9FT ADLT (ELECTROSURGICAL)
ELECTRODE REM PT RTRN 9FT ADLT (ELECTROSURGICAL) IMPLANT
GAUZE SPONGE 4X4 12PLY STRL (GAUZE/BANDAGES/DRESSINGS) ×3 IMPLANT
GLOVE BIOGEL PI IND STRL 9 (GLOVE) ×1 IMPLANT
GLOVE BIOGEL PI INDICATOR 9 (GLOVE) ×2
GLOVE SURG ORTHO 9.0 STRL STRW (GLOVE) ×3 IMPLANT
GOWN STRL REUS W/ TWL XL LVL3 (GOWN DISPOSABLE) ×2 IMPLANT
GOWN STRL REUS W/TWL XL LVL3 (GOWN DISPOSABLE) ×4
HANDPIECE INTERPULSE COAX TIP (DISPOSABLE)
KIT BASIN OR (CUSTOM PROCEDURE TRAY) ×3 IMPLANT
KIT ROOM TURNOVER OR (KITS) ×3 IMPLANT
MANIFOLD NEPTUNE II (INSTRUMENTS) ×3 IMPLANT
NS IRRIG 1000ML POUR BTL (IV SOLUTION) ×3 IMPLANT
PACK ORTHO EXTREMITY (CUSTOM PROCEDURE TRAY) ×3 IMPLANT
PAD ARMBOARD 7.5X6 YLW CONV (MISCELLANEOUS) ×6 IMPLANT
PAD NEG PRESSURE SENSATRAC (MISCELLANEOUS) ×3 IMPLANT
SET HNDPC FAN SPRY TIP SCT (DISPOSABLE) IMPLANT
STOCKINETTE IMPERVIOUS 9X36 MD (GAUZE/BANDAGES/DRESSINGS) IMPLANT
SUT ETHILON 2 0 PSLX (SUTURE) ×3 IMPLANT
SWAB COLLECTION DEVICE MRSA (MISCELLANEOUS) ×3 IMPLANT
SWAB CULTURE ESWAB REG 1ML (MISCELLANEOUS) IMPLANT
TOWEL OR 17X26 10 PK STRL BLUE (TOWEL DISPOSABLE) ×3 IMPLANT
TUBE CONNECTING 12'X1/4 (SUCTIONS) ×1
TUBE CONNECTING 12X1/4 (SUCTIONS) ×2 IMPLANT
YANKAUER SUCT BULB TIP NO VENT (SUCTIONS) ×3 IMPLANT

## 2017-10-03 NOTE — Op Note (Signed)
10/03/2017  12:49 PM  PATIENT:  [name]    PRE-OPERATIVE DIAGNOSIS:  abcess left foot  POST-OPERATIVE DIAGNOSIS:  Same  PROCEDURE:  IRRIGATION AND DEBRIDEMENT EXTREMITY  SURGEON:  Newt Minion, MD  PHYSICIAN ASSISTANT:None ANESTHESIA:   General  PREOPERATIVE INDICATIONS:  [name] is a  47 y.o. female with a diagnosis of abcess left foot who failed conservative measures and elected for surgical management.    The risks benefits and alternatives were discussed with the patient preoperatively including but not limited to the risks of infection, bleeding, nerve injury, cardiopulmonary complications, the need for revision surgery, among others, and the patient was willing to proceed.  OPERATIVE IMPLANTS: Cleanse choice wound VAC.  @ENCIMAGES @  OPERATIVE FINDINGS: Abscess dorsum left foot culture sent x2.  OPERATIVE PROCEDURE: Patient was brought the operating room and underwent a general anesthetic.  After adequate levels of anesthesia were obtained patient's left lower extremity was prepped using DuraPrep draped in the sterile field a timeout was called.  A football shaped incision was made dorsally over the first webspace to encompass the necrotic tissue.  This left a wound that was 7 x 3 cm.  There was a deep abscess with purulence cultures were obtained x2.  The wound was irrigated with normal saline necrotic tissue was removed with a rondure.  The wound was partially closed both the proximal and distal limbs with 2-0 nylon.  A cleanse choice wound VAC was then applied over the open wound which was 5 x 3 cm.  This was covered with [place] this had a good suction fit patient was extubated taken the PACU in stable condition.   DISCHARGE PLANNING:  Antibiotic duration: Continue antibiotics IV until cultures are obtained.  Once finalized cultures were obtained patient can be discharged on 3 weeks of oral antibiotics.  Weightbearing: Nonweightbearing left lower  extremity  Pain medication: As ordered  Dressing care/ Wound VAC: Leave wound VAC in place until discharge.  Ambulatory devices: Crutches or walker.  Discharge to: Home.  After IV antibiotics completed.  Follow-up: In the office 1 week post operative.

## 2017-10-03 NOTE — Progress Notes (Signed)
PROGRESS NOTE    [name]  UTM:[id number] DOB: 11-05-1970 DOA: 10/02/2017 PCP: [facility] Brief Narrative  47 y.o. female with medical history significant of IV drug abuse, psoriasis of his skin, shot up in her foot some cocaine with an IV and since then has been having swelling and redness in her left foot.  Patient denies any fevers.  Patient has a significant cellulitis and abscess to her left foot with involvement of her extender tendon tendons of her toes.  Orthopedic surgery is been called Dr. Mechele Dawley at [facility] who requests patient be transferred to [facility] for washout in the [place] at home.   Assessment & Plan:   Principal Problem:   Cellulitis and abscess of foot Active Problems:   PSORIASIS   Polysubstance dependence including opioid type drug, episodic abuse (HCC)  1] cellulitis and abscess of her left foot status post IV injecting IV cocaine.  Patient on vancomycin and Zosyn continue.  Patient to be taken to the OR today for washout of the joint.  Continue pain control.  2] history of hypertension blood pressure elevated will restart Norvasc 10 mg daily as she was taking at home.  I will also started on Lopressor IV as needed.  3] hypokalemia replete and recheck.  DVT prophylaxis: SCD  Code Status: Code Family Communication: None Disposition Plan: TBD Consultants: Orthopedic surgery  Procedures: None Antimicrobials: Vanco and Zosyn  Subjective: Just want to get the surgery over with so I can eat. Objective: Vitals:   10/02/17 1133 10/02/17 1642 10/03/17 0005 10/03/17 0615  BP: (!) 192/134 (!) 150/96 (!) 199/95 (!) 182/99  Pulse: (!) 108  100 96  Resp: 20  20 18   Temp: 98.2 F (36.8 C)  98.4 F (36.9 C) 98.3 F (36.8 C)  TempSrc: Oral  Oral Oral  SpO2: 97%  98% 99%  Weight: 72.6 kg (160 lb)  77.2 kg (170 lb 3.1 oz)   Height: 5\' 2"  (1.575 m)  5\' 2"  (1.575 m)     Intake/Output Summary (Last 24 hours) at  10/03/2017 1251 Last data filed at 10/03/2017 1233 Gross per 24 hour  Intake 3290 ml  Output 20 ml  Net 3270 ml   Filed Weights   10/02/17 1133 10/03/17 0005  Weight: 72.6 kg (160 lb) 77.2 kg (170 lb 3.1 oz)    Examination:  General exam: Appears calm and comfortable  Respiratory system: Clear to auscultation. Respiratory effort normal. Cardiovascular system: S1 & S2 heard, RRR. No JVD, murmurs, rubs, gallops or clicks. No pedal edema. Gastrointestinal system: Abdomen is nondistended, soft and nontender. No organomegaly or masses felt. Normal bowel sounds heard. Central nervous system: Alert and oriented. No focal neurological deficits. Extremities: Left foot dressings on. Skin: No rashes, lesions or ulcers Psychiatry: Judgement and insight appear normal. Mood & affect appropriate.     Data Reviewed: I have personally reviewed following labs and imaging studies  CBC: Recent Labs  Lab 10/02/17 1431  WBC 11.0*  NEUTROABS 7.1  HGB 12.6  HCT 39.0  MCV 88.2  PLT 568   Basic Metabolic Panel: Recent Labs  Lab 10/02/17 1431  NA 136  K 3.1*  CL 100*  CO2 25  GLUCOSE 126*  BUN 16  CREATININE 0.84  CALCIUM 9.3   GFR: Estimated Creatinine Clearance: 80.5 mL/min (by C-G formula based on SCr of 0.84 mg/dL). Liver Function Tests: Recent Labs  Lab 10/02/17 1431  AST 23  ALT 27  ALKPHOS 60  BILITOT 0.7  PROT 8.0  ALBUMIN 3.6   No results for input(s): LIPASE, AMYLASE in the last 168 hours. No results for input(s): AMMONIA in the last 168 hours. Coagulation Profile: No results for input(s): INR, PROTIME in the last 168 hours. Cardiac Enzymes: No results for input(s): CKTOTAL, CKMB, CKMBINDEX, TROPONINI in the last 168 hours. BNP (last 3 results) No results for input(s): PROBNP in the last 8760 hours. HbA1C: No results for input(s): HGBA1C in the last 72 hours. CBG: No results for input(s): GLUCAP in the last 168 hours. Lipid Profile: No results for input(s):  CHOL, HDL, LDLCALC, TRIG, CHOLHDL, LDLDIRECT in the last 72 hours. Thyroid Function Tests: No results for input(s): TSH, T4TOTAL, FREET4, T3FREE, THYROIDAB in the last 72 hours. Anemia Panel: No results for input(s): VITAMINB12, FOLATE, FERRITIN, TIBC, IRON, RETICCTPCT in the last 72 hours. Sepsis Labs: Recent Labs  Lab 10/02/17 1450  LATICACIDVEN 1.17    No results found for this or any previous visit (from the past 240 hour(s)).       Radiology Studies: Dg Ankle Complete Left  Result Date: 10/02/2017 CLINICAL DATA:  Abscess on top of foot. EXAM: LEFT ANKLE COMPLETE - 3+ VIEW COMPARISON:  None. FINDINGS: Three-view exam left ankle shows no acute fracture. No subluxation or dislocation. No worrisome lytic or sclerotic osseous abnormality. Diffuse soft tissue swelling is evident without radiopaque soft tissue foreign body. IMPRESSION: Diffuse soft tissue swelling without underlying bony abnormality. Electronically Signed   By: Misty Stanley M.D.   On: 10/02/2017 13:55   Mr Foot Left Wo Contrast  Result Date: 10/02/2017 CLINICAL DATA:  Patient was shooting up cocaine 4 days ago and her left foot. Foot abscess. EXAM: MRI OF THE LEFT FOOT WITHOUT CONTRAST TECHNIQUE: Multiplanar, multisequence MR imaging of the was performed. No intravenous contrast was administered. COMPARISON:  Ankle radiographs 10/02/2017 FINDINGS: Limited study as the patient Bones/Joint/Cartilage No evidence of acute fracture, joint dislocation nor joint effusion. The distal tibia and fibular intact. Subchondral cystic change likely degenerative in etiology deep to the medial talar dome is noted. No marrow signal abnormality to indicate acute osteomyelitis. Ligaments Intact Muscles and Tendons Tenosynovitis of the extensor hallucis longus tendon as well as extensor tendons to the third and fourth rays. No tendon rupture is identified. Given patient's history of IV drug abuse, infectious tenosynovitis is a possibility. No  evidence of pyomyositis or myositis. Soft tissues 18 x 11 x 9 mm dorsal soft tissue fluid collection compatible with a subcutaneous abscess overlying the dorsum of the proximal first metatarsal. Subcutaneous edema about the included ankle and dorsum of the foot consistent with cellulitis. IMPRESSION: 1. No marrow signal abnormality suspicious for acute osteomyelitis. Degenerative subchondral cystic change of the medial talar dome is noted at the ankle. 2. Fluid outlining extensor tendons to the toes most prominently involving the extensor hallucis longus and to a lesser degree the extensor tendons to the third and fourth rays. Given history of intravenous injections 2 foot, the possibility of infected tenosynovitis is raised. Over the dorsum of the first metatarsal is a subcutaneous fluid collection measuring 18 x 11 x 9 mm that may represent a small subcutaneous abscess or iatrogenically introduced fluid. Electronically Signed   By: Ashley Royalty M.D.   On: 10/02/2017 15:20        Scheduled Meds: . chlorhexidine  60 mL Topical Once  . povidone-iodine  2 application Topical Once   Continuous Infusions: . 0.9 % NaCl with KCl  20 mEq / L 75 mL/hr at 10/03/17 0032  . lactated ringers 10 mL/hr at 10/03/17 1045  . [MAR Hold] piperacillin-tazobactam (ZOSYN)  IV 3.375 g (10/03/17 8242)  . [MAR Hold] vancomycin Stopped (10/03/17 0528)     LOS: 1 day      Georgette Shell, MD [facility] If 7PM-7AM, please contact night-coverage [email or web address] Password [facility] 10/03/2017, 12:51 PM

## 2017-10-03 NOTE — Progress Notes (Signed)
Patient arrived at 2355 alert and oriented and whimpering in pain.

## 2017-10-03 NOTE — Anesthesia Preprocedure Evaluation (Addendum)
Anesthesia Evaluation  Patient identified by MRN, date of birth, ID band Patient awake    Reviewed: Allergy & Precautions, H&P , NPO status , Patient's Chart, lab work & pertinent test results  Airway Mallampati: II  TM Distance: >3 FB Neck ROM: Full    Dental  (+) Edentulous Upper, Edentulous Lower   Pulmonary Current Smoker,    breath sounds clear to auscultation       Cardiovascular hypertension, Pt. on medications  Rhythm:Regular Rate:Normal     Neuro/Psych PSYCHIATRIC DISORDERS Anxiety Depression  Neuromuscular disease    GI/Hepatic PUD, GERD  ,(+)     substance abuse  cocaine use, marijuana use and IV drug use, Hepatitis -, C  Endo/Other    Renal/GU      Musculoskeletal   Abdominal   Peds  Hematology  (+) anemia ,   Anesthesia Other Findings Drug screen this AM positive for multiple substances including cocaine. Surgery cancelled, pt advised to discontinue cocaine prior to elective procedures.  Reproductive/Obstetrics                             Anesthesia Physical  Anesthesia Plan  ASA: III  Anesthesia Plan: General   Post-op Pain Management:    Induction: Intravenous  PONV Risk Score and Plan: 3 and Ondansetron and Dexamethasone  Airway Management Planned: Oral ETT  Additional Equipment:   Intra-op Plan:   Post-operative Plan: Extubation in OR  Informed Consent: I have reviewed the patients History and Physical, chart, labs and discussed the procedure including the risks, benefits and alternatives for the proposed anesthesia with the patient or authorized representative who has indicated his/her understanding and acceptance.   Dental advisory given  Plan Discussed with: CRNA  Anesthesia Plan Comments: (  )       Anesthesia Quick Evaluation

## 2017-10-03 NOTE — Transfer of Care (Signed)
Immediate Anesthesia Transfer of Care Note  Patient: [name]  Procedure(s) Performed: IRRIGATION AND DEBRIDEMENT EXTREMITY (Left Foot)  Patient Location: PACU  Anesthesia Type:General  Level of Consciousness: awake, alert , oriented and patient cooperative  Airway & Oxygen Therapy: Patient Spontanous Breathing and Patient connected to nasal cannula oxygen  Post-op Assessment: Report given to RN and Post -op Vital signs reviewed and stable  Post vital signs: Reviewed and stable  Last Vitals:  Vitals:   10/03/17 0005 10/03/17 0615  BP: (!) 199/95 (!) 182/99  Pulse: 100 96  Resp: 20 18  Temp: 36.9 C 36.8 C  SpO2: 98% 99%    Last Pain:  Vitals:   10/03/17 0615  TempSrc: Oral  PainSc: 10-Worst pain ever         Complications: No apparent anesthesia complications

## 2017-10-03 NOTE — Evaluation (Signed)
Physical Therapy Evaluation Patient Details Name: [name] MRN: [id number] DOB: [birth year] Today's Date: 10/03/2017   History of Present Illness  47 y.o. female with medical history significant of IV drug abuse, psoriasis of his skin, shot up in her foot some cocaine with an IV and since then has been having swelling and redness in her left foot.  Patient denies any fevers.  Patient has a significant cellulitis and abscess to her left foot with involvement of her extender tendon tendons of her toes.  Orthopedic surgery is been called Dr. Mechele Dawley at [facility] who requests patient be transferred to [facility] for washout in the [place] at home. now s/p I&D 3/15    Clinical Impression  Patient is s/p above surgery resulting in functional limitations due to the deficits listed below (see PT Problem List). PTA, pt living with daughter and two adult children in 1 story level entrance home with 24 hour support. Pt was independent with all mobility without AD. Upon eval pt presents with L foot pain that limits her mobility. Able to progress to supervision level ambulation with cueing for NWB status. Will need reinforcement next PT visit. Ambulated 43' and returned to room.  Patient will benefit from skilled PT to increase their independence and safety with mobility to allow discharge to the venue listed below.       Follow Up Recommendations Follow surgeon's recommendation for DC plan and follow-up therapies;Supervision for mobility/OOB    Equipment Recommendations  Rolling walker with 5" wheels;3in1 (PT)    Recommendations for Other Services       Precautions / Restrictions Precautions Precautions: Fall Restrictions Weight Bearing Restrictions: Yes Other Position/Activity Restrictions: NWB      Mobility  Bed Mobility Overal bed mobility: Modified Independent                Transfers Overall transfer level: Modified independent Equipment used: Rolling walker (2  wheeled)                Ambulation/Gait Ambulation/Gait assistance: Supervision Ambulation Distance (Feet): 50 Feet Assistive device: Rolling walker (2 wheeled) Gait Pattern/deviations: Step-to pattern Gait velocity: decreased   General Gait Details: hop to gait, cues for sequencing with RW, patient with cues for NWB adherence.   Stairs            Wheelchair Mobility    Modified Rankin (Stroke Patients Only)       Balance Overall balance assessment: Needs assistance   Sitting balance-Leahy Scale: Good       Standing balance-Leahy Scale: Fair Standing balance comment: RW for dynamic balance due to NWB status                             Pertinent Vitals/Pain Pain Assessment: 0-10 Faces Pain Scale: Hurts whole lot Pain Location: L foot Pain Descriptors / Indicators: Aching;Operative site guarding;Grimacing Pain Intervention(s): Limited activity within patient's tolerance;Monitored during session;Premedicated before session;Repositioned    Home Living Family/patient expects to be discharged to:: Private residence Living Arrangements: Parent Available Help at Discharge: Family;Available 24 hours/day Type of Home: House Home Access: Level entry     Home Layout: One level Home Equipment: Walker - 2 wheels;Cane - single point      Prior Function Level of Independence: Independent               Hand Dominance        Extremity/Trunk Assessment   Upper Extremity  Assessment Upper Extremity Assessment: Overall WFL for tasks assessed    Lower Extremity Assessment Lower Extremity Assessment: Overall WFL for tasks assessed       Communication   Communication: No difficulties  Cognition Arousal/Alertness: Awake/alert Behavior During Therapy: WFL for tasks assessed/performed Overall Cognitive Status: Within Functional Limits for tasks assessed                                        General Comments       Exercises     Assessment/Plan    PT Assessment Patient needs continued PT services  PT Problem List Decreased strength;Decreased range of motion;Decreased activity tolerance;Decreased mobility;Decreased balance;Decreased knowledge of use of DME;Pain       PT Treatment Interventions DME instruction;Gait training;Stair training;Functional mobility training;Therapeutic activities;Therapeutic exercise    PT Goals (Current goals can be found in the Care Plan section)  Acute Rehab PT Goals Patient Stated Goal: return home PT Goal Formulation: With patient Time For Goal Achievement: 10/10/17 Potential to Achieve Goals: Good    Frequency Min 5X/week   Barriers to discharge        Co-evaluation               AM-PAC PT "6 Clicks" Daily Activity  Outcome Measure Difficulty turning over in bed (including adjusting bedclothes, sheets and blankets)?: None Difficulty moving from lying on back to sitting on the side of the bed? : None Difficulty sitting down on and standing up from a chair with arms (e.g., wheelchair, bedside commode, etc,.)?: None Help needed moving to and from a bed to chair (including a wheelchair)?: A Little Help needed walking in hospital room?: A Little Help needed climbing 3-5 steps with a railing? : A Little 6 Click Score: 21    End of Session Equipment Utilized During Treatment: Gait belt Activity Tolerance: Patient tolerated treatment well Patient left: in bed;with call bell/phone within reach Nurse Communication: Mobility status PT Visit Diagnosis: Unsteadiness on feet (R26.81);Other abnormalities of gait and mobility (R26.89);Pain Pain - Right/Left: Left Pain - part of body: Ankle and joints of foot    Time: 2947-6546 PT Time Calculation (min) (ACUTE ONLY): 25 min   Charges:   PT Evaluation $PT Eval Low Complexity: 1 Low PT Treatments $Gait Training: 8-22 mins   PT G Codes:        Reinaldo Berber, PT, DPT Acute Rehab Services Pager:  [phone]    Reinaldo Berber 10/03/2017, 6:50 PM

## 2017-10-03 NOTE — Progress Notes (Signed)
Abscess on left foot burst with purulent dng. Area cleaned and loose moist to dry drsg applied. MD on call notified.

## 2017-10-03 NOTE — Anesthesia Procedure Notes (Signed)
Procedure Name: LMA Insertion Date/Time: 10/03/2017 12:13 PM Performed by: Lowella Dell, CRNA Pre-anesthesia Checklist: Patient identified, Emergency Drugs available, Suction available and Patient being monitored Patient Re-evaluated:Patient Re-evaluated prior to induction Oxygen Delivery Method: Circle System Utilized Preoxygenation: Pre-oxygenation with 100% oxygen Induction Type: IV induction Ventilation: Mask ventilation without difficulty LMA: LMA inserted LMA Size: 4.0 Number of attempts: 1 Placement Confirmation: positive ETCO2 Tube secured with: Tape Dental Injury: Teeth and Oropharynx as per pre-operative assessment

## 2017-10-03 NOTE — Progress Notes (Signed)
Orthopedic Tech Progress Note Patient Details:  [name] 1971-04-06 [id number]  Ortho Devices Type of Ortho Device: Postop shoe/boot Ortho Device/Splint Interventions: Application   Post Interventions Patient Tolerated: Well Instructions Provided: Care of device   [place] Pink 10/03/2017, 4:09 PM

## 2017-10-03 NOTE — Consult Note (Signed)
[name] is an 47 y.o. female.    Chief Complaint: left foot pain  HPI:  47 y/o female with history of polysubstance abuse and IV drug use c/o 4 days of left foot pain and swelling. Pt seen at emergency room tonight and diagnosed with cellulitis and probable abscess to dorsal left foot. Pt c/o moderate to severe pain currently. Denies any fevers or chills. Denies any other symptoms other than pain. Plan to be seen by Dr. Sharol Given tomorrow for definitive surgery and treatment.  PCP:  [facility]  PMH: Past Medical History:  Diagnosis Date  . Anxiety   . Arthritis   . Cancer ([place])   . Cervical cancer ([place])   . Depressed   . Drug addiction ([place]) Hep C  . Gastroesophageal reflux disease   . Gastroparesis   . Hepatitis C    pt denies any treatment  . Hypertension   . Polysubstance dependence including opioid type drug, episodic abuse ([place])   . Psoriasis   . Renal disorder    kidney stones    PSes:  Allergies  Allergen Reactions  . Tramadol Hcl Anaphylaxis and Swelling    Of tongue  . Ketorolac Tromethamine Nausea And Vomiting  . Propoxyphene Napsylate Rash    Medications: Current Facility-Administered Medications  Medication Dose Route Frequency Provider Last Rate Last Dose  . 0.9 % NaCl with KCl 20 mEq/ L  infusion   Intravenous Continuous Phillips Grout, MD 75 mL/hr at 10/03/17 0032    . albuterol (PROVENTIL) (2.5 MG/3ML) 0.083% nebulizer solution 3 mL  3 mL Inhalation Q6H PRN Derrill Kay A, MD      . morphine 4 MG/ML injection 4 mg  4 mg Intravenous Q2H PRN Phillips Grout, MD   4 mg at 10/03/17 0027  . ondansetron (ZOFRAN) tablet 4 mg  4 mg Oral Q6H PRN Phillips Grout, MD       Or  . ondansetron [facility]) injection 4 mg  4 mg Intravenous Q6H PRN Phillips Grout, MD      . piperacillin-tazobactam (ZOSYN) IVPB 3.375 g  3.375 g Intravenous Vladimir Creeks, MD 12.5 mL/hr at 10/03/17 0048 3.375 g at 10/03/17 0048  . vancomycin  (VANCOCIN) IVPB 750 mg/150 ml premix  750 mg Intravenous Q12H Noemi Chapel, MD        Results for orders placed or performed during the hospital encounter of 10/02/17 (from the past 48 hour(s))  Urinalysis, Routine w reflex microscopic     Status: Abnormal   Collection Time: 10/02/17  1:26 PM  Result Value Ref Range   Color, Urine YELLOW YELLOW   APPearance CLOUDY (A) CLEAR   Specific Gravity, Urine 1.019 1.005 - 1.030   pH 6.0 5.0 - 8.0   Glucose, UA NEGATIVE NEGATIVE mg/dL   Hgb urine dipstick NEGATIVE NEGATIVE   Bilirubin Urine NEGATIVE NEGATIVE   Ketones, ur NEGATIVE NEGATIVE mg/dL   Protein, ur 30 (A) NEGATIVE mg/dL   Nitrite NEGATIVE NEGATIVE   Leukocytes, UA TRACE (A) NEGATIVE   RBC / HPF 0-5 0 - 5 RBC/hpf   WBC, UA 0-5 0 - 5 WBC/hpf   Bacteria, UA RARE (A) NONE SEEN   Squamous Epithelial / LPF 6-30 (A) NONE SEEN   Mucus PRESENT    Hyaline Casts, UA PRESENT     Comment: Performed at [facility], [address], [place], [place] [address]  Comprehensive metabolic panel     Status: Abnormal  Collection Time: 10/02/17  2:31 PM  Result Value Ref Range   Sodium 136 135 - 145 mmol/L   Potassium 3.1 (L) 3.5 - 5.1 mmol/L   Chloride 100 (L) 101 - 111 mmol/L   CO2 25 22 - 32 mmol/L   Glucose, Bld 126 (H) 65 - 99 mg/dL   BUN 16 6 - 20 mg/dL   Creatinine, Ser 0.84 0.44 - 1.00 mg/dL   Calcium 9.3 8.9 - 10.3 mg/dL   Total Protein 8.0 6.5 - 8.1 g/dL   Albumin 3.6 3.5 - 5.0 g/dL   AST 23 15 - 41 U/L   ALT 27 14 - 54 U/L   Alkaline Phosphatase 60 38 - 126 U/L   Total Bilirubin 0.7 0.3 - 1.2 mg/dL   GFR calc non Af Amer >60 >60 mL/min   GFR calc Af Amer >60 >60 mL/min    Comment: (NOTE) The eGFR has been calculated using the CKD EPI equation. This calculation has not been validated in all clinical situations. eGFR's persistently <60 mL/min signify possible Chronic Kidney Disease.    Anion gap 11 5 - 15    Comment: Performed at [facility], [address], [place],  [place] [address]  CBC WITH DIFFERENTIAL     Status: Abnormal   Collection Time: 10/02/17  2:31 PM  Result Value Ref Range   WBC 11.0 (H) 4.0 - 10.5 K/uL   RBC 4.42 3.87 - 5.11 MIL/uL   Hemoglobin 12.6 12.0 - 15.0 g/dL   HCT 39.0 36.0 - 46.0 %   MCV 88.2 78.0 - 100.0 fL   MCH 28.5 26.0 - 34.0 pg   MCHC 32.3 30.0 - 36.0 g/dL   RDW 13.6 11.5 - 15.5 %   Platelets 263 150 - 400 K/uL   Neutrophils Relative % 64 %   Neutro Abs 7.1 1.7 - 7.7 K/uL   Lymphocytes Relative 26 %   Lymphs Abs 2.8 0.7 - 4.0 K/uL   Monocytes Relative 7 %   Monocytes Absolute 0.8 0.1 - 1.0 K/uL   Eosinophils Relative 3 %   Eosinophils Absolute 0.3 0.0 - 0.7 K/uL   Basophils Relative 0 %   Basophils Absolute 0.0 0.0 - 0.1 K/uL    Comment: Performed at [facility], [address], [place], [place] [address]  I-Stat CG4 Lactic Acid, ED  (not at  [facility])     Status: None   Collection Time: 10/02/17  2:50 PM  Result Value Ref Range   Lactic Acid, Venous 1.17 0.5 - 1.9 mmol/L  Urine rapid drug screen (hosp performed)     Status: Abnormal   Collection Time: 10/02/17  6:46 PM  Result Value Ref Range   Opiates POSITIVE (A) NONE DETECTED   Cocaine POSITIVE (A) NONE DETECTED   Benzodiazepines POSITIVE (A) NONE DETECTED   Amphetamines POSITIVE (A) NONE DETECTED   Tetrahydrocannabinol POSITIVE (A) NONE DETECTED   Barbiturates NONE DETECTED NONE DETECTED    Comment: (NOTE) DRUG SCREEN FOR MEDICAL PURPOSES ONLY.  IF CONFIRMATION IS NEEDED FOR ANY PURPOSE, NOTIFY LAB WITHIN 5 DAYS. LOWEST DETECTABLE LIMITS FOR URINE DRUG SCREEN Drug Class                     Cutoff (ng/mL) Amphetamine and metabolites    1000 Barbiturate and metabolites    200 Benzodiazepine                 371 Tricyclics and metabolites     300 Opiates  and metabolites        300 Cocaine and metabolites        300 THC                            50 Performed at [facility], [address], [place], [place] [address]    Dg Ankle Complete Left  Result  Date: 10/02/2017 CLINICAL DATA:  Abscess on top of foot. EXAM: LEFT ANKLE COMPLETE - 3+ VIEW COMPARISON:  None. FINDINGS: Three-view exam left ankle shows no acute fracture. No subluxation or dislocation. No worrisome lytic or sclerotic osseous abnormality. Diffuse soft tissue swelling is evident without radiopaque soft tissue foreign body. IMPRESSION: Diffuse soft tissue swelling without underlying bony abnormality. Electronically Signed   By: Misty Stanley M.D.   On: 10/02/2017 13:55   Mr Foot Left Wo Contrast  Result Date: 10/02/2017 CLINICAL DATA:  Patient was shooting up cocaine 4 days ago and her left foot. Foot abscess. EXAM: MRI OF THE LEFT FOOT WITHOUT CONTRAST TECHNIQUE: Multiplanar, multisequence MR imaging of the was performed. No intravenous contrast was administered. COMPARISON:  Ankle radiographs 10/02/2017 FINDINGS: Limited study as the patient Bones/Joint/Cartilage No evidence of acute fracture, joint dislocation nor joint effusion. The distal tibia and fibular intact. Subchondral cystic change likely degenerative in etiology deep to the medial talar dome is noted. No marrow signal abnormality to indicate acute osteomyelitis. Ligaments Intact Muscles and Tendons Tenosynovitis of the extensor hallucis longus tendon as well as extensor tendons to the third and fourth rays. No tendon rupture is identified. Given patient's history of IV drug abuse, infectious tenosynovitis is a possibility. No evidence of pyomyositis or myositis. Soft tissues 18 x 11 x 9 mm dorsal soft tissue fluid collection compatible with a subcutaneous abscess overlying the dorsum of the proximal first metatarsal. Subcutaneous edema about the included ankle and dorsum of the foot consistent with cellulitis. IMPRESSION: 1. No marrow signal abnormality suspicious for acute osteomyelitis. Degenerative subchondral cystic change of the medial talar dome is noted at the ankle. 2. Fluid outlining extensor tendons to the toes most  prominently involving the extensor hallucis longus and to a lesser degree the extensor tendons to the third and fourth rays. Given history of intravenous injections 2 foot, the possibility of infected tenosynovitis is raised. Over the dorsum of the first metatarsal is a subcutaneous fluid collection measuring 18 x 11 x 9 mm that may represent a small subcutaneous abscess or iatrogenically introduced fluid. Electronically Signed   By: Ashley Royalty M.D.   On: 10/02/2017 15:20    ROS: ROS Pain with ambulation and movement of left foot  Physical Exam: Alert and appropriate 47 y/o female in no acute distress Left foot: moderate erythema to dorsal foot with small area of ecchymosis/necrosis over the 3rd metatarsal area nv intact distally Pain with rom of toes and foot/ankle Mild erythema extends above the left ankle No signs of drainage currently Physical Exam   Assessment/Plan Assessment: left foot cellulitis/abscess  Plan: Dr. Veverly Fells discussed this case with Dr. Sharol Given who will take over management of her foot and plan for definitive surgery/I&D tomorrow Keep NPO Pain management per the primary medical team Will continue to monitor her progress  Brad Luna [place], [place] is now [facility]  [place] [address], [place], [place] [address] Phone: [phone] [email or web address] Facebook  [employer]

## 2017-10-03 NOTE — Consult Note (Signed)
ORTHOPAEDIC CONSULTATION  REQUESTING PHYSICIAN: Georgette Shell, MD  Chief Complaint: Abscess dorsum left foot.  HPI: [name] is a 47 y.o. female who presents with abscess dorsum left foot.  Patient states that she was injecting IV drugs when she developed an abscess about a week ago..  Past Medical History:  Diagnosis Date  . Anxiety   . Arthritis   . Cancer ([place])   . Cervical cancer ([place])   . Depressed   . Drug addiction ([place]) Hep C  . Gastroesophageal reflux disease   . Gastroparesis   . Hepatitis C    pt denies any treatment  . Hypertension   . Polysubstance dependence including opioid type drug, episodic abuse ([place])   . Psoriasis   . Renal disorder    kidney stones   Past Surgical History:  Procedure Laterality Date  . APPENDECTOMY    . ESOPHAGOGASTRODUODENOSCOPY  01/04/2008   Dr Lamar Laundry reflux esophagitis, sm HH, gastritis, BRAVO positive for GERD on BID PPI  . LASER ABLATION CONDYLOMA CERVICAL / VULVAR    . mandible surgery for fracture    . TUBAL LIGATION     Social History   Socioeconomic History  . Marital status: Divorced    Spouse name: None  . Number of children: 2  . Years of education: None  . Highest education level: None  Social Needs  . Financial resource strain: None  . Food insecurity - worry: None  . Food insecurity - inability: None  . Transportation needs - medical: None  . Transportation needs - non-medical: None  Occupational History  . Occupation: disabled    Fish farm manager: UNEMPLOYED  Tobacco Use  . Smoking status: Current Every Day Smoker    Packs/day: 1.00    Years: 28.00    Pack years: 28.00    Types: Cigarettes  . Smokeless tobacco: Never Used  Substance and Sexual Activity  . Alcohol use: No    Comment: occ  . Drug use: No    Comment: herione last week.   Marland Kitchen Sexual activity: Not Currently    Birth control/protection: None, Surgical  Other Topics Concern  . None  Social History Narrative   Lives  w/ mother & 2 children (34 &13)   Family History  Problem Relation Age of Onset  . Hyperlipidemia Mother   . Diabetes Mother   . Hypertension Mother   . Hypertension Sister   . Hepatitis C Sister   . Hypertension Father   . Colon cancer Neg Hx   . Liver disease Neg Hx    - negative except otherwise stated in the family history section Allergies  Allergen Reactions  . Tramadol Hcl Anaphylaxis and Swelling    Of tongue  . Ketorolac Tromethamine Nausea And Vomiting  . Propoxyphene Napsylate Rash   Prior to Admission medications   Medication Sig Start Date End Date Taking? Authorizing Provider  albuterol (PROVENTIL HFA;VENTOLIN HFA) 108 (90 Base) MCG/ACT inhaler Inhale 1-2 puffs into the lungs every 6 (six) hours as needed for wheezing or shortness of breath.   Yes [provider]  amLODipine (NORVASC) 10 MG tablet Take 1 tablet (10 mg total) by mouth daily. 10/25/16  Yes Noemi Chapel, MD  aspirin EC 81 MG tablet Take 81 mg by mouth 2 (two) times a week.    Yes [provider]  Aspirin-Acetaminophen-Caffeine (GOODYS EXTRA STRENGTH PO) Take 1 Package by mouth daily as needed (pain).   Yes [provider]  clonazePAM Bobbye [place]) 1  MG tablet Take 1 mg by mouth 3 (three) times daily.   Yes [provider]  ibuprofen (ADVIL,MOTRIN) 200 MG tablet Take 800 mg by mouth every 6 (six) hours as needed for moderate pain.   Yes [provider]  oxyCODONE-acetaminophen (PERCOCET) 7.5-325 MG tablet Take 1 tablet by mouth 2 (two) times daily as needed for moderate pain or severe pain.   Yes [provider]   Dg Ankle Complete Left  Result Date: 10/02/2017 CLINICAL DATA:  Abscess on top of foot. EXAM: LEFT ANKLE COMPLETE - 3+ VIEW COMPARISON:  None. FINDINGS: Three-view exam left ankle shows no acute fracture. No subluxation or dislocation. No worrisome lytic or sclerotic osseous abnormality. Diffuse soft tissue swelling is evident without radiopaque  soft tissue foreign body. IMPRESSION: Diffuse soft tissue swelling without underlying bony abnormality. Electronically Signed   By: Misty Stanley M.D.   On: 10/02/2017 13:55   Mr Foot Left Wo Contrast  Result Date: 10/02/2017 CLINICAL DATA:  Patient was shooting up cocaine 4 days ago and her left foot. Foot abscess. EXAM: MRI OF THE LEFT FOOT WITHOUT CONTRAST TECHNIQUE: Multiplanar, multisequence MR imaging of the was performed. No intravenous contrast was administered. COMPARISON:  Ankle radiographs 10/02/2017 FINDINGS: Limited study as the patient Bones/Joint/Cartilage No evidence of acute fracture, joint dislocation nor joint effusion. The distal tibia and fibular intact. Subchondral cystic change likely degenerative in etiology deep to the medial talar dome is noted. No marrow signal abnormality to indicate acute osteomyelitis. Ligaments Intact Muscles and Tendons Tenosynovitis of the extensor hallucis longus tendon as well as extensor tendons to the third and fourth rays. No tendon rupture is identified. Given patient's history of IV drug abuse, infectious tenosynovitis is a possibility. No evidence of pyomyositis or myositis. Soft tissues 18 x 11 x 9 mm dorsal soft tissue fluid collection compatible with a subcutaneous abscess overlying the dorsum of the proximal first metatarsal. Subcutaneous edema about the included ankle and dorsum of the foot consistent with cellulitis. IMPRESSION: 1. No marrow signal abnormality suspicious for acute osteomyelitis. Degenerative subchondral cystic change of the medial talar dome is noted at the ankle. 2. Fluid outlining extensor tendons to the toes most prominently involving the extensor hallucis longus and to a lesser degree the extensor tendons to the third and fourth rays. Given history of intravenous injections 2 foot, the possibility of infected tenosynovitis is raised. Over the dorsum of the first metatarsal is a subcutaneous fluid collection measuring 18 x 11 x 9  mm that may represent a small subcutaneous abscess or iatrogenically introduced fluid. Electronically Signed   By: Ashley Royalty M.D.   On: 10/02/2017 15:20   - pertinent xrays, CT, MRI studies were reviewed and independently interpreted  Positive ROS: All other systems have been reviewed and were otherwise negative with the exception of those mentioned in the HPI and as above.  Physical Exam: General: Alert, no acute distress Psychiatric: Patient is competent for consent with normal mood and affect Lymphatic: No axillary or cervical lymphadenopathy Cardiovascular: No pedal edema Respiratory: No cyanosis, no use of accessory musculature GI: No organomegaly, abdomen is soft and non-tender  Skin: Examination patient has cellulitis blistering and purulent draining ulcer dorsum left foot.  Images:  @ENCIMAGES @   Neurologic: Patient does not have protective sensation bilateral lower extremities.   MUSCULOSKELETAL:  Patient does not have a palpable pulse due to swelling she is not diabetic she does smoke.  She has good capillary refill in the left foot with  large cellulitis and abscess.  Assessment:  Assessment: Abscess dorsum left foot.   Plan: Plan: We will plan for irrigation and debridement left foot possible need for installation wound VAC therapy.  We will plan for surgery this afternoon.  The importance of n.p.o. was discussed with the patient the risks of surgery including potential amputation of her foot was discussed patient states she understands wished to proceed at this time.  Thank you for the consult and the opportunity to see Ms. Veverly Fells, MD [facility] [place] [phone] 7:00 AM

## 2017-10-04 NOTE — Progress Notes (Signed)
   Subjective: 1 Day Post-Op Procedure(s) (LRB): IRRIGATION AND DEBRIDEMENT EXTREMITY (Left) Patient reports pain as moderate.  IV Heroin abuser with abscess from foot injections. VAC seal good.  Objective: Vital signs in last 24 hours: Temp:  [97.2 F (36.2 C)-98.6 F (37 C)] 97.4 F (36.3 C) (03/16 0911) Pulse Rate:  [71-102] 79 (03/16 0911) Resp:  [10-23] 20 (03/16 0911) BP: (133-193)/(73-125) 174/105 (03/16 0918) SpO2:  [90 %-100 %] 97 % (03/16 0911)  Intake/Output from previous day: 03/15 0701 - 03/16 0700 In: 1140 [P.O.:640; I.V.:500] Out: 320 [Urine:300; Blood:20] Intake/Output this shift: No intake/output data recorded.  Recent Labs    10/02/17 1431  HGB 12.6   Recent Labs    10/02/17 1431  WBC 11.0*  RBC 4.42  HCT 39.0  PLT 263   Recent Labs    10/02/17 1431  NA 136  K 3.1*  CL 100*  CO2 25  BUN 16  CREATININE 0.84  GLUCOSE 126*  CALCIUM 9.3   No results for input(s): LABPT, INR in the last 72 hours.  good VAC seal No results found.  Assessment/Plan: 1 Day Post-Op Procedure(s) (LRB): IRRIGATION AND DEBRIDEMENT EXTREMITY (Left) Plan:  IV ABX.  Once cultures back home on PO ABX times 3 wks per Dr. Sharol Given note. NWB.  VAC stays on until discharge.   Marybelle Killings 10/04/2017, 12:22 PM

## 2017-10-04 NOTE — Plan of Care (Signed)
  Activity: Risk for activity intolerance will decrease 10/04/2017 1147 - Progressing by Williams Che, RN   Nutrition: Adequate nutrition will be maintained 10/04/2017 1147 - Progressing by Williams Che, RN   Elimination: Will not experience complications related to bowel motility 10/04/2017 1147 - Progressing by Williams Che, RN   Pain Managment: General experience of comfort will improve 10/04/2017 1147 - Progressing by Williams Che, RN   Safety: Ability to remain free from injury will improve 10/04/2017 1147 - Progressing by Williams Che, RN

## 2017-10-04 NOTE — Progress Notes (Signed)
PT Cancellation Note  Patient Details Name: [name] MRN: [id number] DOB: January 20, 1971   Cancelled Treatment:    Reason Eval/Treat Not Completed: Fatigue/lethargy limiting ability to participate. Pt sleeping soundly upon arrival.  Upon waking, pt stated she has not had any sleep and just wants to rest. She declined PT at this time.  Will check back as schedule permits.   Galen Manila 10/04/2017, 10:19 AM

## 2017-10-04 NOTE — Progress Notes (Signed)
PROGRESS NOTE    [name]  OVF:[id number] DOB: 1970-11-30 DOA: 10/02/2017 PCP: The [place]femalewith medical history significant ofIV drug abuse, psoriasis of his skin, shot up in her foot some cocaine with an IV and since then has been having swelling and redness in her left foot. Patient denies any fevers. Patient has a significant cellulitis and abscess to her left foot with involvement of her extender tendon tendons of her toes. Orthopedic surgery is been called Dr. Mechele Dawley at [facility] who requests patient be transferred to [facility] for washout in the [place].     Assessment & Plan:   Principal Problem:   Cellulitis and abscess of foot Active Problems:   PSORIASIS   Polysubstance dependence including opioid type drug, episodic abuse (HCC) 1] cellulitis and abscess of her left foot status post IV injecting IV cocaine.  Patient on vancomycin and Zosyn continue.  Patient was taken to the OR and had irrigation and debridement of the left extremity.  Continue antibiotics follow-up cultures.  Blood culture and urine culture no growth so far.  2] history of hypertension blood pressure elevated will restart Norvasc 10 mg daily as she was taking at home.  I will also started on Lopressor IV as needed.  3] hypokalemia replete and recheck     DVT prophylaxis: scd Code Status:full Family Communication:none Disposition Plan:tbd   Consultants:ortho  Procedures: Irrigation and debridement of the left lower extremity Antimicrobials: Vanco Zosyn  Subjective: Asleep in bed in no acute distress denies any new complaints other than pain in her  left foot.   Objective: Vitals:   10/03/17 1940 10/03/17 2142 10/04/17 0100 10/04/17 0540  BP: (!) 144/96 133/86 (!) 151/97 134/73  Pulse: 94 89  71  Resp: 16 18 19 19   Temp:   98.6 F (37 C) 98.3 F (36.8 C)  TempSrc:    Oral  SpO2: 97% 95% 95% 96%  Weight:        Height:        Intake/Output Summary (Last 24 hours) at 10/04/2017 0828 Last data filed at 10/04/2017 0100 Gross per 24 hour  Intake 1140 ml  Output 320 ml  Net 820 ml   Filed Weights   10/02/17 1133 10/03/17 0005  Weight: 72.6 kg (160 lb) 77.2 kg (170 lb 3.1 oz)    Examination:  General exam: Appears calm and comfortable  Respiratory system: Clear to auscultation. Respiratory effort normal. Cardiovascular system: S1 & S2 heard, RRR. No JVD, murmurs, rubs, gallops or clicks. No pedal edema. Gastrointestinal system: Abdomen is nondistended, soft and nontender. No organomegaly or masses felt. Normal bowel sounds heard. Central nervous system: Alert and oriented. No focal neurological deficits. Extremities: Symmetric 5 x 5 power. Skin: No rashes, lesions or ulcers Psychiatry: Judgement and insight appear normal. Mood & affect appropriate.     Data Reviewed: I have personally reviewed following labs and imaging studies  CBC: Recent Labs  Lab 10/02/17 1431  WBC 11.0*  NEUTROABS 7.1  HGB 12.6  HCT 39.0  MCV 88.2  PLT 841   Basic Metabolic Panel: Recent Labs  Lab 10/02/17 1431  NA 136  K 3.1*  CL 100*  CO2 25  GLUCOSE 126*  BUN 16  CREATININE 0.84  CALCIUM 9.3   GFR: Estimated Creatinine Clearance: 80.5 mL/min (by C-G formula based on SCr of 0.84 mg/dL). Liver Function Tests: Recent Labs  Lab 10/02/17 1431  AST 23  ALT 27  ALKPHOS 60  BILITOT 0.7  PROT 8.0  ALBUMIN 3.6   No results for input(s): LIPASE, AMYLASE in the last 168 hours. No results for input(s): AMMONIA in the last 168 hours. Coagulation Profile: No results for input(s): INR, PROTIME in the last 168 hours. Cardiac Enzymes: No results for input(s): CKTOTAL, CKMB, CKMBINDEX, TROPONINI in the last 168 hours. BNP (last 3 results) No results for input(s): PROBNP in the last 8760 hours. HbA1C: No results for input(s): HGBA1C in the last 72 hours. CBG: No results for input(s): GLUCAP in the  last 168 hours. Lipid Profile: No results for input(s): CHOL, HDL, LDLCALC, TRIG, CHOLHDL, LDLDIRECT in the last 72 hours. Thyroid Function Tests: No results for input(s): TSH, T4TOTAL, FREET4, T3FREE, THYROIDAB in the last 72 hours. Anemia Panel: No results for input(s): VITAMINB12, FOLATE, FERRITIN, TIBC, IRON, RETICCTPCT in the last 72 hours. Sepsis Labs: Recent Labs  Lab 10/02/17 1450  LATICACIDVEN 1.17    Recent Results (from the past 240 hour(s))  Aerobic/Anaerobic Culture (surgical/deep wound)     Status: None (Preliminary result)   Collection Time: 10/03/17 12:24 PM  Result Value Ref Range Status   Specimen Description ABSCESS LEFT FOOT  Final   Special Requests NONE  Final   Gram Stain   Final    RARE WBC PRESENT, PREDOMINANTLY PMN FEW GRAM POSITIVE COCCI IN PAIRS Performed at [facility], [place] [address] [place], [place] [address]    Culture PENDING  Incomplete   Report Status PENDING  Incomplete         Radiology Studies: Dg Ankle Complete Left  Result Date: 10/02/2017 CLINICAL DATA:  Abscess on top of foot. EXAM: LEFT ANKLE COMPLETE - 3+ VIEW COMPARISON:  None. FINDINGS: Three-view exam left ankle shows no acute fracture. No subluxation or dislocation. No worrisome lytic or sclerotic osseous abnormality. Diffuse soft tissue swelling is evident without radiopaque soft tissue foreign body. IMPRESSION: Diffuse soft tissue swelling without underlying bony abnormality. Electronically Signed   By: Misty Stanley M.D.   On: 10/02/2017 13:55   Mr Foot Left Wo Contrast  Result Date: 10/02/2017 CLINICAL DATA:  Patient was shooting up cocaine 4 days ago and her left foot. Foot abscess. EXAM: MRI OF THE LEFT FOOT WITHOUT CONTRAST TECHNIQUE: Multiplanar, multisequence MR imaging of the was performed. No intravenous contrast was administered. COMPARISON:  Ankle radiographs 10/02/2017 FINDINGS: Limited study as the patient Bones/Joint/Cartilage No evidence of acute  fracture, joint dislocation nor joint effusion. The distal tibia and fibular intact. Subchondral cystic change likely degenerative in etiology deep to the medial talar dome is noted. No marrow signal abnormality to indicate acute osteomyelitis. Ligaments Intact Muscles and Tendons Tenosynovitis of the extensor hallucis longus tendon as well as extensor tendons to the third and fourth rays. No tendon rupture is identified. Given patient's history of IV drug abuse, infectious tenosynovitis is a possibility. No evidence of pyomyositis or myositis. Soft tissues 18 x 11 x 9 mm dorsal soft tissue fluid collection compatible with a subcutaneous abscess overlying the dorsum of the proximal first metatarsal. Subcutaneous edema about the included ankle and dorsum of the foot consistent with cellulitis. IMPRESSION: 1. No marrow signal abnormality suspicious for acute osteomyelitis. Degenerative subchondral cystic change of the medial talar dome is noted at the ankle. 2. Fluid outlining extensor tendons to the toes most prominently involving the extensor hallucis longus and to a lesser degree the extensor tendons to the third and fourth rays. Given history of intravenous injections 2 foot,  the possibility of infected tenosynovitis is raised. Over the dorsum of the first metatarsal is a subcutaneous fluid collection measuring 18 x 11 x 9 mm that may represent a small subcutaneous abscess or iatrogenically introduced fluid. Electronically Signed   By: Ashley Royalty M.D.   On: 10/02/2017 15:20        Scheduled Meds: . amLODipine  10 mg Oral Daily  . clonazePAM  1 mg Oral TID  . docusate sodium  100 mg Oral BID   Continuous Infusions: . sodium chloride 10 mL/hr at 10/03/17 1532  . 0.9 % NaCl with KCl 20 mEq / L 75 mL/hr at 10/03/17 0032  . lactated ringers 10 mL/hr at 10/03/17 1045  . methocarbamol (ROBAXIN)  IV    . piperacillin-tazobactam (ZOSYN)  IV 3.375 g (10/04/17 0549)  . vancomycin Stopped (10/04/17 0553)      LOS: 2 days     Georgette Shell, MD [facility] If 7PM-7AM, please contact night-coverage [email or web address] Password [facility] 10/04/2017, 8:28 AM

## 2017-10-05 LAB — VANCOMYCIN, TROUGH: Vancomycin Tr: 8 ug/mL — ABNORMAL LOW (ref 15–20)

## 2017-10-05 MED ORDER — NICOTINE 21 MG/24HR TD PT24
21.0000 mg | MEDICATED_PATCH | Freq: Every day | TRANSDERMAL | Status: DC
  Administered 2017-10-05: 21 mg via TRANSDERMAL
  Filled 2017-10-05 (×2): qty 1

## 2017-10-05 MED ORDER — METOPROLOL SUCCINATE ER 25 MG PO TB24
25.0000 mg | ORAL_TABLET | Freq: Every day | ORAL | Status: DC
  Administered 2017-10-05 – 2017-10-06 (×2): 25 mg via ORAL
  Filled 2017-10-05 (×2): qty 1

## 2017-10-05 MED ORDER — VANCOMYCIN HCL IN DEXTROSE 1-5 GM/200ML-% IV SOLN
1000.0000 mg | Freq: Two times a day (BID) | INTRAVENOUS | Status: DC
  Administered 2017-10-06: 1000 mg via INTRAVENOUS
  Filled 2017-10-05 (×2): qty 200

## 2017-10-05 MED ORDER — HYDROMORPHONE HCL 1 MG/ML IJ SOLN
0.5000 mg | INTRAMUSCULAR | Status: DC | PRN
  Administered 2017-10-05: 0.5 mg via INTRAVENOUS
  Filled 2017-10-05: qty 1

## 2017-10-05 MED ORDER — LORAZEPAM 2 MG/ML IJ SOLN
1.0000 mg | INTRAMUSCULAR | Status: DC | PRN
  Administered 2017-10-05 – 2017-10-06 (×5): 1 mg via INTRAVENOUS
  Filled 2017-10-05 (×5): qty 1

## 2017-10-05 NOTE — Progress Notes (Addendum)
PROGRESS NOTE    [name]  FGH:[id number] DOB: 1970/11/18 DOA: 10/02/2017 PCP: [facility] [place] Narrative:47 y.o.femalewith medical history significant ofIV drug abuse, psoriasis of his skin, shot up in her foot some cocaine with an IV and since then has been having swelling and redness in her left foot. Patient denies any fevers. Patient has a significant cellulitis and abscess to her left foot with involvement of her extender tendon tendons of her toes. Orthopedic surgery is been called Dr. Mechele Dawley at [facility] who requests patient be transferred to [facility] for washout in the [place].    Assessment & Plan:   Principal Problem:   Cellulitis and abscess of foot Active Problems:   PSORIASIS   Polysubstance dependence including opioid type drug, episodic abuse (HCC)  Principal Problem:   Cellulitis and abscess of foot Active Problems:   PSORIASIS   Polysubstance dependence including opioid type drug, episodic abuse (HCC) 1]cellulitis and abscess of her left foot status post IV injecting IV cocaine. Patient on vancomycin and Zosyn continue. Patient was taken to the OR and had irrigation and debridement of the left extremity.  Continue antibiotics follow-up cultures.  Blood culture and urine culture no growth so far.  Wound culture with staph species sensitivity and identification pending.  2]history of hypertension blood pressure elevated will restart Norvasc 10 mg daily as she was taking at home. I will also started on Lopressor IV as needed.  Blood pressure remains elevated on maxed out dose of Norvasc.  I will add metoprolol daily.  3]hypokalemia replete and recheck tomorrow.     DVT prophylaxis: SCD Code Status: Full code Family Communication: No family available Disposition Plan: Await final culture for narrowing of antibiotics prior to discharge.  Consultants: Ortho  Procedures: Irrigation and debridement of the left  lower extremity Antimicrobials: Vanco and Zosyn  Subjective: Resting complains of being tired wants to sleep  Objective: Vitals:   10/04/17 1419 10/04/17 1631 10/04/17 2009 10/05/17 0618  BP: (!) 182/103 (!) 160/93 (!) 148/96 (!) 165/97  Pulse: (!) 103 77 79 75  Resp: 20 20    Temp: 98.7 F (37.1 C)  98 F (36.7 C) 97.8 F (36.6 C)  TempSrc: Oral  Oral Axillary  SpO2: 100% 99% 97% 99%  Weight:      Height:        Intake/Output Summary (Last 24 hours) at 10/05/2017 0822 Last data filed at 10/05/2017 0308 Gross per 24 hour  Intake 5586.83 ml  Output -  Net 5586.83 ml   Filed Weights   10/02/17 1133 10/03/17 0005  Weight: 72.6 kg (160 lb) 77.2 kg (170 lb 3.1 oz)    Examination:  General exam: Appears calm and comfortable  Respiratory system: Clear to auscultation. Respiratory effort normal. Cardiovascular system: S1 & S2 heard, RRR. No JVD, murmurs, rubs, gallops or clicks. No pedal edema. Gastrointestinal system: Abdomen is nondistended, soft and nontender. No organomegaly or masses felt. Normal bowel sounds heard. Central nervous system: Alert and oriented. No focal neurological deficits. Extremities: Left foot dressings on VAC in place. Skin: No rashes, lesions or ulcers Psychiatry: Judgement and insight appear normal. Mood & affect appropriate.     Data Reviewed: I have personally reviewed following labs and imaging studies  CBC: Recent Labs  Lab 10/02/17 1431  WBC 11.0*  NEUTROABS 7.1  HGB 12.6  HCT 39.0  MCV 88.2  PLT 678   Basic Metabolic Panel: Recent Labs  Lab 10/02/17  1431  NA 136  K 3.1*  CL 100*  CO2 25  GLUCOSE 126*  BUN 16  CREATININE 0.84  CALCIUM 9.3   GFR: Estimated Creatinine Clearance: 80.5 mL/min (by C-G formula based on SCr of 0.84 mg/dL). Liver Function Tests: Recent Labs  Lab 10/02/17 1431  AST 23  ALT 27  ALKPHOS 60  BILITOT 0.7  PROT 8.0  ALBUMIN 3.6   No results for input(s): LIPASE, AMYLASE in the last 168  hours. No results for input(s): AMMONIA in the last 168 hours. Coagulation Profile: No results for input(s): INR, PROTIME in the last 168 hours. Cardiac Enzymes: No results for input(s): CKTOTAL, CKMB, CKMBINDEX, TROPONINI in the last 168 hours. BNP (last 3 results) No results for input(s): PROBNP in the last 8760 hours. HbA1C: No results for input(s): HGBA1C in the last 72 hours. CBG: No results for input(s): GLUCAP in the last 168 hours. Lipid Profile: No results for input(s): CHOL, HDL, LDLCALC, TRIG, CHOLHDL, LDLDIRECT in the last 72 hours. Thyroid Function Tests: No results for input(s): TSH, T4TOTAL, FREET4, T3FREE, THYROIDAB in the last 72 hours. Anemia Panel: No results for input(s): VITAMINB12, FOLATE, FERRITIN, TIBC, IRON, RETICCTPCT in the last 72 hours. Sepsis Labs: Recent Labs  Lab 10/02/17 1450  LATICACIDVEN 1.17    Recent Results (from the past 240 hour(s))  Aerobic/Anaerobic Culture (surgical/deep wound)     Status: None (Preliminary result)   Collection Time: 10/03/17 12:24 PM  Result Value Ref Range Status   Specimen Description ABSCESS LEFT FOOT  Final   Special Requests NONE  Final   Gram Stain   Final    RARE WBC PRESENT, PREDOMINANTLY PMN FEW GRAM POSITIVE COCCI IN PAIRS Performed at [facility], [place] [address], [place], [place] [address]    Culture MODERATE STAPHYLOCOCCUS AUREUS  Final   Report Status PENDING  Incomplete         Radiology Studies: No results found.      Scheduled Meds: . amLODipine  10 mg Oral Daily  . clonazePAM  1 mg Oral TID  . docusate sodium  100 mg Oral BID   Continuous Infusions: . sodium chloride 10 mL/hr at 10/03/17 1532  . 0.9 % NaCl with KCl 20 mEq / L 75 mL/hr at 10/03/17 0032  . lactated ringers 10 mL/hr at 10/03/17 1045  . methocarbamol (ROBAXIN)  IV    . piperacillin-tazobactam (ZOSYN)  IV 3.375 g (10/05/17 0531)  . vancomycin Stopped (10/05/17 0325)     LOS: 3 days     Georgette Shell, MD [facility]  If 7PM-7AM, please contact night-coverage [email or web address] Password [email or web address] 10/05/2017, 8:22 AM

## 2017-10-05 NOTE — Progress Notes (Signed)
Requested IV team to come and place another site due to patient's current site becoming difficult to push medications through, and IV machine reading occluded. IV team came looked at current site, stated that it looked good and due to patient being a difficult stick they are just going to wait until the current site fails. IV team refused to place new site. Will continue to monitor patient.

## 2017-10-05 NOTE — Progress Notes (Signed)
Patient was re-educated by charge nurse, and has now stated that she will be staying to receive care.

## 2017-10-05 NOTE — Progress Notes (Signed)
Provided patient with printed education on skin abscess, cellulitis, and smoking cessation/steps to quit smoking.

## 2017-10-05 NOTE — Progress Notes (Signed)
Pharmacy Antibiotic Note  [name] is a 47 y.o. female admitted on 10/02/2017 with cellulitis.  Pharmacy has been consulted for vancomycin and zosyn dosing.  S/p I&D on 3/15  Vancomycin level = 8, drawn ~ 13 hrs after dose. Est. true trough ~ 9  Zosyn 3/14 >> Vanc 3/14 >>  3/15 L-foot abscess - moderate staph aureus  Plan: Increase Vancomycin to 1 g IV q12 hours Continue zosyn 3.375 gm IV q8 hours F/u renal function, cultures and clinical course  Height: 5\' 2"  (157.5 cm) Weight: 170 lb 3.1 oz (77.2 kg) IBW/kg (Calculated) : 50.1  Temp (24hrs), Avg:98 F (36.7 C), Min:97.8 F (36.6 C), Max:98.2 F (36.8 C)  Recent Labs  Lab 10/02/17 1431 10/02/17 1450 10/05/17 1534  WBC 11.0*  --   --   CREATININE 0.84  --   --   LATICACIDVEN  --  1.17  --   VANCOTROUGH  --   --  8*    Estimated Creatinine Clearance: 80.5 mL/min (by C-G formula based on SCr of 0.84 mg/dL).    Allergies  Allergen Reactions  . Tramadol Hcl Anaphylaxis and Swelling    Of tongue  . Ketorolac Tromethamine Nausea And Vomiting  . Propoxyphene Napsylate Rash     Thank you for allowing pharmacy to be a part of this patient's care.  Maryanna Shape, PharmD, BCPS  Clinical Pharmacist  Pager: [phone]   10/05/2017 5:01 PM

## 2017-10-05 NOTE — Progress Notes (Signed)
SCDs on right lower extremity, WVAC intact, no leaks, minimal drainage (76mL) canister marked.

## 2017-10-05 NOTE — Progress Notes (Signed)
Patient has arrived back on floor. A/O x4, no complaints other than pain scale 10 out of 10. Patient back in bed, and bed alarms on, [facility] in place

## 2017-10-05 NOTE — Progress Notes (Signed)
Patient leaving floor at this time to smoke. Dr. Zigmund Daniel, MD aware and notified of situation. No order was given for patient to leave floor. Patient educated on risks of smoking. Patient verbalized understanding and agreed, however, is still remaining non-compliant with situation.

## 2017-10-05 NOTE — Progress Notes (Signed)
Pt has been educated on leaving the unit to smoke. RN spoke with Dr. Zigmund Daniel about situation, and Dr. Zigmund Daniel has also spoken with patient. Patient still refuses to stay on unit. MD notified.

## 2017-10-05 NOTE — Progress Notes (Signed)
Contacted Dr. Lorin Mercy (on call for [facility]), due to patient wanting to leave AMA. Dr. Lorin Mercy stated to educate the patient about leaving AMA and notify the hospitalist. Patient has been educated by several RNs about leaving AMA, about [facility] removal, infections, no prescriptions if leaving AMA, and possible amputation of foot due to no antibiotics. Dr. Zigmund Daniel was contacted, and stated the same information. Will continue to educate and monitor patient

## 2017-10-06 ENCOUNTER — Encounter (HOSPITAL_COMMUNITY): Payer: Self-pay | Admitting: Orthopedic Surgery

## 2017-10-06 ENCOUNTER — Telehealth (INDEPENDENT_AMBULATORY_CARE_PROVIDER_SITE_OTHER): Payer: Self-pay

## 2017-10-06 LAB — CBC WITH DIFFERENTIAL/PLATELET
Basophils Absolute: 0 10*3/uL (ref 0.0–0.1)
Basophils Relative: 0 %
EOS PCT: 3 %
Eosinophils Absolute: 0.3 10*3/uL (ref 0.0–0.7)
HCT: 37.5 % (ref 36.0–46.0)
Hemoglobin: 12.1 g/dL (ref 12.0–15.0)
LYMPHS ABS: 3.5 10*3/uL (ref 0.7–4.0)
Lymphocytes Relative: 38 %
MCH: 28.5 pg (ref 26.0–34.0)
MCHC: 32.3 g/dL (ref 30.0–36.0)
MCV: 88.2 fL (ref 78.0–100.0)
Monocytes Absolute: 0.5 10*3/uL (ref 0.1–1.0)
Monocytes Relative: 6 %
NEUTROS ABS: 4.8 10*3/uL (ref 1.7–7.7)
Neutrophils Relative %: 53 %
PLATELETS: 288 10*3/uL (ref 150–400)
RBC: 4.25 MIL/uL (ref 3.87–5.11)
RDW: 14 % (ref 11.5–15.5)
WBC: 9.1 10*3/uL (ref 4.0–10.5)

## 2017-10-06 LAB — BASIC METABOLIC PANEL
ANION GAP: 11 (ref 5–15)
BUN: 14 mg/dL (ref 6–20)
CALCIUM: 8.6 mg/dL — AB (ref 8.9–10.3)
CO2: 23 mmol/L (ref 22–32)
CREATININE: 0.79 mg/dL (ref 0.44–1.00)
Chloride: 103 mmol/L (ref 101–111)
GFR calc Af Amer: >60 mL/min (ref >60)
GLUCOSE: 93 mg/dL (ref 65–99)
Potassium: 4 mmol/L (ref 3.5–5.1)
Sodium: 137 mmol/L (ref 135–145)

## 2017-10-06 MED ORDER — METOPROLOL SUCCINATE ER 25 MG PO TB24: 25.0000 mg | ORAL_TABLET | Freq: Every day | ORAL | 0 refills | Status: DC

## 2017-10-06 MED ORDER — DOXYCYCLINE HYCLATE 50 MG PO CAPS: 100.0000 mg | ORAL_CAPSULE | Freq: Two times a day (BID) | ORAL | 0 refills | Status: DC

## 2017-10-06 MED ORDER — NICOTINE 21 MG/24HR TD PT24: 21.0000 mg | MEDICATED_PATCH | Freq: Every day | TRANSDERMAL | 0 refills | Status: DC

## 2017-10-06 NOTE — Anesthesia Postprocedure Evaluation (Signed)
Anesthesia Post Note  Patient: [name]  Procedure(s) Performed: IRRIGATION AND DEBRIDEMENT EXTREMITY (Left Foot)     Patient location during evaluation: PACU Anesthesia Type: General Level of consciousness: sedated and patient cooperative Pain management: pain level controlled Vital Signs Assessment: post-procedure vital signs reviewed and stable Respiratory status: spontaneous breathing Cardiovascular status: stable Anesthetic complications: no    Last Vitals:  Vitals:   10/05/17 2003 10/06/17 0501  BP: 129/83 (!) 179/92  Pulse: 70 70  Resp: 16 16  Temp: 36.8 C 36.6 C  SpO2: 98% 98%    Last Pain:  Vitals:   10/06/17 0600  TempSrc:   PainSc: [place]

## 2017-10-06 NOTE — Progress Notes (Signed)
Dr Zigmund Daniel called- instructed that Dr Sharol Given wrote to discontinue the wound vac on discharge.  Wound vac removed and abd dressing with gauze applied. Instructed the patient NOT to take the dressing off until she follows up with Dr Sharol Given in 1 week.  Patient verbalizes understanding.  Discharge instructions provided along with instructions to follow up and to avoid infection.  Patient verbalizes understanding these instructions.  Discharged to home with her mother.

## 2017-10-06 NOTE — Telephone Encounter (Signed)
Patient Called and would like something for pain. Patient had SU

## 2017-10-06 NOTE — Progress Notes (Signed)
Patient asking for pain medication and anxiety medication but immediately falls back to sleep when leaving the room.

## 2017-10-06 NOTE — Discharge Summary (Signed)
Physician Discharge Summary  [name] PXT:[id number] DOB: 25-Feb-1971 DOA: 10/02/2017  PCP: [facility] date: 10/02/2017 Discharge date: 10/06/2017  Admitted From: Home Disposition: Home Recommendations for Outpatient Follow-up:  1. Follow up with PCP in 1-2 weeks 2. Please obtain BMP/CBC in one week  Home Health: None Equipment/Devices none Discharge Condition: Stable CODE STATUS: Full code Diet recommendation: Regular Brief/Interim Summary:46 y.o.femalewith medical history significant ofIV drug abuse, psoriasis of his skin, shot up in her foot some cocaine with an IV and since then has been having swelling and redness in her left foot. Patient denies any fevers. Patient has a significant cellulitis and abscess to her left foot with involvement of her extender tendon tendons of her toes. Orthopedic surgery is been called Dr. Mechele Dawley at [facility] who requests patient be transferred to [facility] for washout in the [place].    Discharge Diagnoses:  Principal Problem:   Cellulitis and abscess of foot Active Problems:   PSORIASIS   Polysubstance dependence including opioid type drug, episodic abuse (HCC) Cellulitis and abscess of foot Active Problems: PSORIASIS Polysubstance dependence including opioid type drug, episodic abuse (HCC) 1]cellulitis and abscess of her left foot status post IV injecting IV cocaine. Patient on vancomycin and Zosyn continue. Patientwas taken to the OR and had irrigation and debridement of the left extremity. Cultures grew MRSA sensitive to tetracycline and Bactrim.  We will discharge her on doxycycline 100 twice daily for 4 weeks.  Patient will follow-up with [facility]  Dr. Sharol Given.    2]history of hypertension blood pressure elevated will restart Norvasc 10 mg daily as she was taking at home.  Continue metoprolol     Discharge Instructions   Allergies as of 10/06/2017      Reactions   Tramadol  Hcl Anaphylaxis, Swelling   Of tongue   Ketorolac Tromethamine Nausea And Vomiting   Propoxyphene Napsylate Rash      Medication List    TAKE these medications   albuterol 108 (90 Base) MCG/ACT inhaler Commonly known as:  PROVENTIL HFA;VENTOLIN HFA Inhale 1-2 puffs into the lungs every 6 (six) hours as needed for wheezing or shortness of breath.   amLODipine 10 MG tablet Commonly known as:  NORVASC Take 1 tablet (10 mg total) by mouth daily.   aspirin EC 81 MG tablet Take 81 mg by mouth 2 (two) times a week.   clonazePAM 1 MG tablet Commonly known as:  KLONOPIN Take 1 mg by mouth 3 (three) times daily.   doxycycline 50 MG capsule Commonly known as:  VIBRAMYCIN Take 2 capsules (100 mg total) by mouth 2 (two) times daily.   GOODYS EXTRA STRENGTH PO Take 1 Package by mouth daily as needed (pain).   ibuprofen 200 MG tablet Commonly known as:  ADVIL,MOTRIN Take 800 mg by mouth every 6 (six) hours as needed for moderate pain.   metoprolol succinate 25 MG 24 hr tablet Commonly known as:  TOPROL-XL Take 1 tablet (25 mg total) by mouth daily.   nicotine 21 mg/24hr patch Commonly known as:  NICODERM CQ - dosed in mg/24 hours Place 1 patch (21 mg total) onto the skin daily.   oxyCODONE-acetaminophen 7.5-325 MG tablet Commonly known as:  PERCOCET Take 1 tablet by mouth 2 (two) times daily as needed for moderate pain or severe pain.      Follow-up Information    Newt Minion, MD Follow up in 1 week(s).   Specialty:  Orthopedic Surgery  Contact information: [place] [address] [phone]          Allergies  Allergen Reactions  . Tramadol Hcl Anaphylaxis and Swelling    Of tongue  . Ketorolac Tromethamine Nausea And Vomiting  . Propoxyphene Napsylate Rash    Consultations:  DR DUDA   Procedures/Studies: Dg Ankle Complete Left  Result Date: 10/02/2017 CLINICAL DATA:  Abscess on top of foot. EXAM: LEFT ANKLE COMPLETE - 3+ VIEW  COMPARISON:  None. FINDINGS: Three-view exam left ankle shows no acute fracture. No subluxation or dislocation. No worrisome lytic or sclerotic osseous abnormality. Diffuse soft tissue swelling is evident without radiopaque soft tissue foreign body. IMPRESSION: Diffuse soft tissue swelling without underlying bony abnormality. Electronically Signed   By: Misty Stanley M.D.   On: 10/02/2017 13:55   Mr Foot Left Wo Contrast  Result Date: 10/02/2017 CLINICAL DATA:  Patient was shooting up cocaine 4 days ago and her left foot. Foot abscess. EXAM: MRI OF THE LEFT FOOT WITHOUT CONTRAST TECHNIQUE: Multiplanar, multisequence MR imaging of the was performed. No intravenous contrast was administered. COMPARISON:  Ankle radiographs 10/02/2017 FINDINGS: Limited study as the patient Bones/Joint/Cartilage No evidence of acute fracture, joint dislocation nor joint effusion. The distal tibia and fibular intact. Subchondral cystic change likely degenerative in etiology deep to the medial talar dome is noted. No marrow signal abnormality to indicate acute osteomyelitis. Ligaments Intact Muscles and Tendons Tenosynovitis of the extensor hallucis longus tendon as well as extensor tendons to the third and fourth rays. No tendon rupture is identified. Given patient's history of IV drug abuse, infectious tenosynovitis is a possibility. No evidence of pyomyositis or myositis. Soft tissues 18 x 11 x 9 mm dorsal soft tissue fluid collection compatible with a subcutaneous abscess overlying the dorsum of the proximal first metatarsal. Subcutaneous edema about the included ankle and dorsum of the foot consistent with cellulitis. IMPRESSION: 1. No marrow signal abnormality suspicious for acute osteomyelitis. Degenerative subchondral cystic change of the medial talar dome is noted at the ankle. 2. Fluid outlining extensor tendons to the toes most prominently involving the extensor hallucis longus and to a lesser degree the extensor tendons to  the third and fourth rays. Given history of intravenous injections 2 foot, the possibility of infected tenosynovitis is raised. Over the dorsum of the first metatarsal is a subcutaneous fluid collection measuring 18 x 11 x 9 mm that may represent a small subcutaneous abscess or iatrogenically introduced fluid. Electronically Signed   By: Ashley Royalty M.D.   On: 10/02/2017 15:20    (Echo, Carotid, EGD, Colonoscopy, ERCP)    Subjective:   Discharge Exam: Vitals:   10/05/17 2003 10/06/17 0501  BP: 129/83 (!) 179/92  Pulse: 70 70  Resp: 16 16  Temp: 98.2 F (36.8 C) 97.9 F (36.6 C)  SpO2: 98% 98%   Vitals:   10/05/17 0618 10/05/17 1300 10/05/17 2003 10/06/17 0501  BP: (!) 165/97 (!) 175/106 129/83 (!) 179/92  Pulse: 75 71 70 70  Resp:  19 16 16   Temp: 97.8 F (36.6 C) 98.2 F (36.8 C) 98.2 F (36.8 C) 97.9 F (36.6 C)  TempSrc: Axillary Oral Oral Oral  SpO2: 99% 100% 98% 98%  Weight:      Height:        General: Pt is alert, awake, not in acute distress Cardiovascular: RRR, S1/S2 +, no rubs, no gallops Respiratory: CTA bilaterally, no wheezing, no rhonchi Abdominal: Soft, NT, ND, bowel sounds + Extremities: no  edema, no cyanosis    The results of significant diagnostics from this hospitalization (including imaging, microbiology, ancillary and laboratory) are listed below for reference.     Microbiology: Recent Results (from the past 240 hour(s))  Aerobic/Anaerobic Culture (surgical/deep wound)     Status: None (Preliminary result)   Collection Time: 10/03/17 12:24 PM  Result Value Ref Range Status   Specimen Description ABSCESS LEFT FOOT  Final   Special Requests NONE  Final   Gram Stain   Final    RARE WBC PRESENT, PREDOMINANTLY PMN FEW GRAM POSITIVE COCCI IN PAIRS Performed at [facility], [address] [place], [place] [address]    Culture   Final    MODERATE METHICILLIN RESISTANT STAPHYLOCOCCUS AUREUS NO ANAEROBES ISOLATED; CULTURE IN PROGRESS FOR  5 DAYS    Report Status PENDING  Incomplete   Organism ID, Bacteria METHICILLIN RESISTANT STAPHYLOCOCCUS AUREUS  Final      Susceptibility   Methicillin resistant staphylococcus aureus - MIC*    CIPROFLOXACIN <=0.5 SENSITIVE Sensitive     ERYTHROMYCIN >=8 RESISTANT Resistant     GENTAMICIN <=0.5 SENSITIVE Sensitive     OXACILLIN RESISTANT Resistant     TETRACYCLINE <=1 SENSITIVE Sensitive     VANCOMYCIN <=0.5 SENSITIVE Sensitive     TRIMETH/SULFA <=10 SENSITIVE Sensitive     CLINDAMYCIN <=0.25 SENSITIVE Sensitive     RIFAMPIN <=0.5 SENSITIVE Sensitive     Inducible Clindamycin NEGATIVE Sensitive     * MODERATE METHICILLIN RESISTANT STAPHYLOCOCCUS AUREUS     Labs: BNP (last 3 results) No results for input(s): BNP in the last 8760 hours. Basic Metabolic Panel: Recent Labs  Lab 10/02/17 1431 10/06/17 0507  NA 136 137  K 3.1* 4.0  CL 100* 103  CO2 25 23  GLUCOSE 126* 93  BUN 16 14  CREATININE 0.84 0.79  CALCIUM 9.3 8.6*   Liver Function Tests: Recent Labs  Lab 10/02/17 1431  AST 23  ALT 27  ALKPHOS 60  BILITOT 0.7  PROT 8.0  ALBUMIN 3.6   No results for input(s): LIPASE, AMYLASE in the last 168 hours. No results for input(s): AMMONIA in the last 168 hours. CBC: Recent Labs  Lab 10/02/17 1431 10/06/17 0507  WBC 11.0* 9.1  NEUTROABS 7.1 4.8  HGB 12.6 12.1  HCT 39.0 37.5  MCV 88.2 88.2  PLT 263 288   Cardiac Enzymes: No results for input(s): CKTOTAL, CKMB, CKMBINDEX, TROPONINI in the last 168 hours. BNP: Invalid input(s): POCBNP CBG: No results for input(s): GLUCAP in the last 168 hours. D-Dimer No results for input(s): DDIMER in the last 72 hours. Hgb A1c No results for input(s): HGBA1C in the last 72 hours. Lipid Profile No results for input(s): CHOL, HDL, LDLCALC, TRIG, CHOLHDL, LDLDIRECT in the last 72 hours. Thyroid function studies No results for input(s): TSH, T4TOTAL, T3FREE, THYROIDAB in the last 72 hours.  Invalid input(s): FREET3 Anemia  work up No results for input(s): VITAMINB12, FOLATE, FERRITIN, TIBC, IRON, RETICCTPCT in the last 72 hours. Urinalysis    Component Value Date/Time   COLORURINE YELLOW 10/02/2017 1326   APPEARANCEUR CLOUDY (A) 10/02/2017 1326   LABSPEC 1.019 10/02/2017 1326   PHURINE 6.0 10/02/2017 1326   GLUCOSEU NEGATIVE 10/02/2017 1326   HGBUR NEGATIVE 10/02/2017 1326   [place] 10/02/2017 1326   KETONESUR NEGATIVE 10/02/2017 1326   PROTEINUR 30 (A) 10/02/2017 1326   UROBILINOGEN 0.2 09/02/2013 1619   NITRITE NEGATIVE 10/02/2017 1326   LEUKOCYTESUR TRACE (A) 10/02/2017 1326  Sepsis Labs Invalid input(s): PROCALCITONIN,  WBC,  LACTICIDVEN Microbiology Recent Results (from the past 240 hour(s))  Aerobic/Anaerobic Culture (surgical/deep wound)     Status: None (Preliminary result)   Collection Time: 10/03/17 12:24 PM  Result Value Ref Range Status   Specimen Description ABSCESS LEFT FOOT  Final   Special Requests NONE  Final   Gram Stain   Final    RARE WBC PRESENT, PREDOMINANTLY PMN FEW GRAM POSITIVE COCCI IN PAIRS Performed at [facility], [address], [place], [place] [address]    Culture   Final    MODERATE METHICILLIN RESISTANT STAPHYLOCOCCUS AUREUS NO ANAEROBES ISOLATED; CULTURE IN PROGRESS FOR 5 DAYS    Report Status PENDING  Incomplete   Organism ID, Bacteria METHICILLIN RESISTANT STAPHYLOCOCCUS AUREUS  Final      Susceptibility   Methicillin resistant staphylococcus aureus - MIC*    CIPROFLOXACIN <=0.5 SENSITIVE Sensitive     ERYTHROMYCIN >=8 RESISTANT Resistant     GENTAMICIN <=0.5 SENSITIVE Sensitive     OXACILLIN RESISTANT Resistant     TETRACYCLINE <=1 SENSITIVE Sensitive     VANCOMYCIN <=0.5 SENSITIVE Sensitive     TRIMETH/SULFA <=10 SENSITIVE Sensitive     CLINDAMYCIN <=0.25 SENSITIVE Sensitive     RIFAMPIN <=0.5 SENSITIVE Sensitive     Inducible Clindamycin NEGATIVE Sensitive     * MODERATE METHICILLIN RESISTANT STAPHYLOCOCCUS AUREUS      Time coordinating discharge: Over 30 minutes  SIGNED:   Georgette Shell, MD  [facility] 10/06/2017, 8:31 AM  If 7PM-7AM, please contact night-coverage [email or web address] Password [email or web address]

## 2017-10-06 NOTE — Progress Notes (Signed)
Pt has been more cooperative. She still is requesting morphine every two hours, even when she is sleeping. She wakes up, calls for her pain med, and then falls asleep. Pt placed on contact precautions due to MRSA in her wound.

## 2017-10-06 NOTE — Progress Notes (Addendum)
Patient ID: [name], female   DOB: 1971/05/21, 47 y.o.   MRN: [id number] Postoperative day 3 irrigation and debridement abscess left  foot.  Culture positive for MRSA.  Patient can be discharged on oral antibiotics for 4 weeks with either Bactrim DS or doxycycline.  Discontinue the wound VAC and apply dry dressing at time of discharge.  I will follow-up in the office in 1 week.  Nonweightbearing left lower extremity.

## 2017-10-07 ENCOUNTER — Other Ambulatory Visit (INDEPENDENT_AMBULATORY_CARE_PROVIDER_SITE_OTHER): Payer: Self-pay

## 2017-10-07 ENCOUNTER — Telehealth (INDEPENDENT_AMBULATORY_CARE_PROVIDER_SITE_OTHER): Payer: Self-pay

## 2017-10-07 MED ORDER — OXYCODONE-ACETAMINOPHEN 5-325 MG PO TABS: 1.0000 | ORAL_TABLET | Freq: Four times a day (QID) | ORAL | 0 refills | Status: DC | PRN

## 2017-10-07 NOTE — Telephone Encounter (Signed)
I called pt and she will come in to the office tomorrow. She was very upset and did not want to come in the office for a nurse only dressing change and said she had an appt on Monday. There was nothing sch for the pt and I offered to make appt. Pt's mother was then on the phone and upset because they had to come to the office to pick up rx for pain medication. I advised that I could make the nurse only vist for tomorrow and she could also pick up her rx and then follow up with Dr. Sharol Given the following week. She did not want to do this. I then advised that I would be happy to make appt for Monday as she thought she had originally and they did not want to come pick up rx and then come back Monday for appt. Finally the pt decided that tomorrow would work best but did not want to come in at 8am I offered an appt for 10 am and the mother said never mind they would just " come in and get it over with" Dr. Sharol Given has already printed rx and this will be given to the pt at the time of her appt tomorrow.

## 2017-10-07 NOTE — Telephone Encounter (Signed)
Patient called and would like a Rx for pain.  Patient had surgery on Friday- 10/03/17.  Cb# is [phone].  Please advise.  Thank You.

## 2017-10-08 ENCOUNTER — Other Ambulatory Visit (INDEPENDENT_AMBULATORY_CARE_PROVIDER_SITE_OTHER): Payer: Self-pay | Admitting: Orthopedic Surgery

## 2017-10-08 ENCOUNTER — Encounter (INDEPENDENT_AMBULATORY_CARE_PROVIDER_SITE_OTHER): Payer: Self-pay

## 2017-10-08 ENCOUNTER — Ambulatory Visit (INDEPENDENT_AMBULATORY_CARE_PROVIDER_SITE_OTHER): Payer: Self-pay

## 2017-10-08 VITALS — Ht 62.0 in | Wt 170.0 lb

## 2017-10-08 DIAGNOSIS — L02612 Cutaneous abscess of left foot: Secondary | ICD-10-CM

## 2017-10-08 DIAGNOSIS — L03119 Cellulitis of unspecified part of limb: Principal | ICD-10-CM

## 2017-10-08 DIAGNOSIS — L02619 Cutaneous abscess of unspecified foot: Secondary | ICD-10-CM

## 2017-10-08 LAB — AEROBIC/ANAEROBIC CULTURE W GRAM STAIN (SURGICAL/DEEP WOUND)

## 2017-10-08 LAB — AEROBIC/ANAEROBIC CULTURE (SURGICAL/DEEP WOUND)

## 2017-10-08 MED FILL — Hydromorphone HCl Inj 2 MG/ML: INTRAMUSCULAR | Qty: 1 | Status: AC

## 2017-10-08 NOTE — Progress Notes (Addendum)
Patient is s/p a left foot irrigation and debridement of abscess. She had a prevena wound vac placed and this was discontinued at time of discharge from hospital several days ago. She come in today non weight bearing in a wheelchair with ABD pad and Kerlix dressing applied. She states that this has remained intact since her d/c from the hospital. The dressing was removed and there is a spot amount of bloody drainage. The foot is swollen, red with ischemic changes to the peri wound area more so on the medial side. There is tendon exposure.  Picture sent to Dr. Sharol Given. He advised to apply silvadene to the wound and he will add the pt on for Friday repeat I&D. He will call pt this afternoon to discuss in further detail. Rx for Percocet that was written yesterday was given to the pt. She voiced understanding of the plan and will await Dr. Jess Barters call this afternoon.   [name], [place], [employer]

## 2017-10-09 ENCOUNTER — Other Ambulatory Visit: Payer: Self-pay

## 2017-10-09 ENCOUNTER — Encounter (HOSPITAL_COMMUNITY): Payer: Self-pay | Admitting: *Deleted

## 2017-10-09 NOTE — Progress Notes (Signed)
Pt denies SOB, chest pain, and being under the care of a cardiologist. Pt denies having a cardiac cath but stated that a stress test was performed > 10 years ago. Requested echo results from [facility].  Pt made aware to stop taking vitamins, fish oil and herbal medications. Do not take any NSAIDs ie: Ibuprofen, Advil, Naproxen (Aleve), Motrin, BC and Goody Powder. Pt verbalized understanding of all pre-op instructions.

## 2017-10-10 ENCOUNTER — Ambulatory Visit (HOSPITAL_COMMUNITY): Payer: Self-pay | Admitting: Anesthesiology

## 2017-10-10 ENCOUNTER — Encounter (HOSPITAL_COMMUNITY): Payer: Self-pay | Admitting: *Deleted

## 2017-10-10 ENCOUNTER — Inpatient Hospital Stay (HOSPITAL_COMMUNITY)
Admission: RE | Admit: 2017-10-10 | Discharge: 2017-10-13 | DRG: 574 | Disposition: A | Discharge status: Home / Self Care | Payer: Self-pay | Source: Ambulatory Visit | Attending: Orthopedic Surgery | Admitting: Orthopedic Surgery

## 2017-10-10 ENCOUNTER — Encounter (HOSPITAL_COMMUNITY)
Admission: RE | Disposition: A | Discharge status: Home / Self Care | Payer: Self-pay | Source: Ambulatory Visit | Attending: Orthopedic Surgery

## 2017-10-10 ENCOUNTER — Other Ambulatory Visit: Payer: Self-pay

## 2017-10-10 DIAGNOSIS — F1290 Cannabis use, unspecified, uncomplicated: Secondary | ICD-10-CM | POA: Diagnosis present

## 2017-10-10 DIAGNOSIS — F419 Anxiety disorder, unspecified: Secondary | ICD-10-CM | POA: Diagnosis present

## 2017-10-10 DIAGNOSIS — Z8249 Family history of ischemic heart disease and other diseases of the circulatory system: Secondary | ICD-10-CM

## 2017-10-10 DIAGNOSIS — Z888 Allergy status to other drugs, medicaments and biological substances status: Secondary | ICD-10-CM

## 2017-10-10 DIAGNOSIS — S91302S Unspecified open wound, left foot, sequela: Secondary | ICD-10-CM

## 2017-10-10 DIAGNOSIS — K219 Gastro-esophageal reflux disease without esophagitis: Secondary | ICD-10-CM | POA: Diagnosis present

## 2017-10-10 DIAGNOSIS — F1490 Cocaine use, unspecified, uncomplicated: Secondary | ICD-10-CM | POA: Diagnosis present

## 2017-10-10 DIAGNOSIS — L03112 Cellulitis of left axilla: Secondary | ICD-10-CM

## 2017-10-10 DIAGNOSIS — Z6831 Body mass index (BMI) 31.0-31.9, adult: Secondary | ICD-10-CM

## 2017-10-10 DIAGNOSIS — F1920 Other psychoactive substance dependence, uncomplicated: Secondary | ICD-10-CM | POA: Diagnosis present

## 2017-10-10 DIAGNOSIS — F329 Major depressive disorder, single episode, unspecified: Secondary | ICD-10-CM | POA: Diagnosis present

## 2017-10-10 DIAGNOSIS — E669 Obesity, unspecified: Secondary | ICD-10-CM | POA: Diagnosis present

## 2017-10-10 DIAGNOSIS — F17210 Nicotine dependence, cigarettes, uncomplicated: Secondary | ICD-10-CM | POA: Diagnosis present

## 2017-10-10 DIAGNOSIS — Z8541 Personal history of malignant neoplasm of cervix uteri: Secondary | ICD-10-CM

## 2017-10-10 DIAGNOSIS — M659 Synovitis and tenosynovitis, unspecified: Secondary | ICD-10-CM | POA: Diagnosis present

## 2017-10-10 DIAGNOSIS — L02612 Cutaneous abscess of left foot: Principal | ICD-10-CM | POA: Diagnosis present

## 2017-10-10 DIAGNOSIS — I10 Essential (primary) hypertension: Secondary | ICD-10-CM | POA: Diagnosis present

## 2017-10-10 HISTORY — PX: APPLICATION OF WOUND VAC: SHX5189

## 2017-10-10 HISTORY — DX: Cutaneous abscess of left foot: L02.612

## 2017-10-10 HISTORY — PX: SKIN SPLIT GRAFT: SHX444

## 2017-10-10 SURGERY — APPLICATION, GRAFT, SKIN, SPLIT-THICKNESS
Anesthesia: General | Site: Foot | Laterality: Left

## 2017-10-10 MED ORDER — 0.9 % SODIUM CHLORIDE (POUR BTL) OPTIME
TOPICAL | Status: DC | PRN
  Administered 2017-10-10: 1000 mL

## 2017-10-10 MED ORDER — LABETALOL HCL 5 MG/ML IV SOLN
INTRAVENOUS | Status: AC
  Administered 2017-10-10: 10 mg via INTRAVENOUS
  Filled 2017-10-10: qty 4

## 2017-10-10 MED ORDER — ALBUTEROL SULFATE (2.5 MG/3ML) 0.083% IN NEBU: 3.0000 mL | INHALATION_SOLUTION | Freq: Four times a day (QID) | RESPIRATORY_TRACT | Status: DC | PRN

## 2017-10-10 MED ORDER — LIDOCAINE HCL (CARDIAC) 20 MG/ML IV SOLN
INTRAVENOUS | Status: AC
  Filled 2017-10-10: qty 5

## 2017-10-10 MED ORDER — ONDANSETRON HCL 4 MG/2ML IJ SOLN
4.0000 mg | Freq: Four times a day (QID) | INTRAMUSCULAR | Status: DC | PRN
  Filled 2017-10-10: qty 2

## 2017-10-10 MED ORDER — ONDANSETRON HCL 4 MG/2ML IJ SOLN
INTRAMUSCULAR | Status: DC | PRN
  Administered 2017-10-10: 4 mg via INTRAVENOUS

## 2017-10-10 MED ORDER — DEXAMETHASONE SODIUM PHOSPHATE 10 MG/ML IJ SOLN
INTRAMUSCULAR | Status: DC | PRN
  Administered 2017-10-10: 10 mg via INTRAVENOUS

## 2017-10-10 MED ORDER — LABETALOL HCL 5 MG/ML IV SOLN
10.0000 mg | Freq: Once | INTRAVENOUS | Status: AC
  Administered 2017-10-10: 10 mg via INTRAVENOUS

## 2017-10-10 MED ORDER — LACTATED RINGERS IV SOLN
INTRAVENOUS | Status: DC | PRN
  Administered 2017-10-10: 10:00:00 via INTRAVENOUS

## 2017-10-10 MED ORDER — CLONAZEPAM 1 MG PO TABS
1.0000 mg | ORAL_TABLET | Freq: Three times a day (TID) | ORAL | Status: DC
  Administered 2017-10-10 – 2017-10-13 (×9): 1 mg via ORAL
  Filled 2017-10-10 (×9): qty 1

## 2017-10-10 MED ORDER — MUPIROCIN CALCIUM 2 % EX CREA
TOPICAL_CREAM | CUTANEOUS | Status: AC
  Filled 2017-10-10: qty 15

## 2017-10-10 MED ORDER — LISINOPRIL-HYDROCHLOROTHIAZIDE 20-12.5 MG PO TABS: 1.0000 | ORAL_TABLET | Freq: Every day | ORAL | Status: DC

## 2017-10-10 MED ORDER — PROPOFOL 10 MG/ML IV BOLUS
INTRAVENOUS | Status: DC | PRN
  Administered 2017-10-10: 150 mg via INTRAVENOUS

## 2017-10-10 MED ORDER — LACTATED RINGERS IV SOLN: INTRAVENOUS | Status: DC | PRN

## 2017-10-10 MED ORDER — HYDROMORPHONE HCL 1 MG/ML IJ SOLN
0.5000 mg | INTRAMUSCULAR | Status: DC | PRN
  Administered 2017-10-10 – 2017-10-13 (×12): 1 mg via INTRAVENOUS
  Filled 2017-10-10 (×12): qty 1

## 2017-10-10 MED ORDER — BISACODYL 10 MG RE SUPP: 10.0000 mg | Freq: Every day | RECTAL | Status: DC | PRN

## 2017-10-10 MED ORDER — ONDANSETRON HCL 4 MG PO TABS: 4.0000 mg | ORAL_TABLET | Freq: Four times a day (QID) | ORAL | Status: DC | PRN

## 2017-10-10 MED ORDER — ACETAMINOPHEN 10 MG/ML IV SOLN: 1000.0000 mg | Freq: Once | INTRAVENOUS | Status: DC | PRN

## 2017-10-10 MED ORDER — METOCLOPRAMIDE HCL 5 MG PO TABS: 5.0000 mg | ORAL_TABLET | Freq: Three times a day (TID) | ORAL | Status: DC | PRN

## 2017-10-10 MED ORDER — OXYCODONE HCL 5 MG PO TABS
5.0000 mg | ORAL_TABLET | ORAL | Status: DC | PRN
  Administered 2017-10-11: 10 mg via ORAL
  Filled 2017-10-10 (×3): qty 2

## 2017-10-10 MED ORDER — ONDANSETRON HCL 4 MG/2ML IJ SOLN
INTRAMUSCULAR | Status: AC
  Filled 2017-10-10: qty 2

## 2017-10-10 MED ORDER — CEFAZOLIN SODIUM-DEXTROSE 2-3 GM-%(50ML) IV SOLR
INTRAVENOUS | Status: DC | PRN
  Administered 2017-10-10: 2 g via INTRAVENOUS

## 2017-10-10 MED ORDER — OXYCODONE HCL 5 MG PO TABS
10.0000 mg | ORAL_TABLET | ORAL | Status: DC | PRN
  Administered 2017-10-11: 10 mg via ORAL
  Administered 2017-10-11 – 2017-10-12 (×6): 15 mg via ORAL
  Administered 2017-10-13: 10 mg via ORAL
  Filled 2017-10-10 (×7): qty 3

## 2017-10-10 MED ORDER — MIDAZOLAM HCL 5 MG/5ML IJ SOLN
INTRAMUSCULAR | Status: DC | PRN
  Administered 2017-10-10: 2 mg via INTRAVENOUS

## 2017-10-10 MED ORDER — POLYETHYLENE GLYCOL 3350 17 G PO PACK: 17.0000 g | PACK | Freq: Every day | ORAL | Status: DC | PRN

## 2017-10-10 MED ORDER — AMLODIPINE BESYLATE 10 MG PO TABS
10.0000 mg | ORAL_TABLET | Freq: Every day | ORAL | Status: DC
  Administered 2017-10-10 – 2017-10-13 (×4): 10 mg via ORAL
  Filled 2017-10-10 (×4): qty 1

## 2017-10-10 MED ORDER — FENTANYL CITRATE (PF) 100 MCG/2ML IJ SOLN
INTRAMUSCULAR | Status: AC
  Administered 2017-10-10: 50 ug via INTRAVENOUS
  Filled 2017-10-10: qty 2

## 2017-10-10 MED ORDER — PROPOFOL 10 MG/ML IV BOLUS
INTRAVENOUS | Status: AC
  Filled 2017-10-10: qty 20

## 2017-10-10 MED ORDER — ACETAMINOPHEN 325 MG PO TABS: 325.0000 mg | ORAL_TABLET | Freq: Four times a day (QID) | ORAL | Status: DC | PRN

## 2017-10-10 MED ORDER — DOCUSATE SODIUM 100 MG PO CAPS
100.0000 mg | ORAL_CAPSULE | Freq: Two times a day (BID) | ORAL | Status: DC
  Administered 2017-10-10 – 2017-10-13 (×7): 100 mg via ORAL
  Filled 2017-10-10 (×7): qty 1

## 2017-10-10 MED ORDER — FENTANYL CITRATE (PF) 250 MCG/5ML IJ SOLN
INTRAMUSCULAR | Status: AC
  Filled 2017-10-10: qty 5

## 2017-10-10 MED ORDER — HYDROCHLOROTHIAZIDE 12.5 MG PO CAPS
12.5000 mg | ORAL_CAPSULE | Freq: Every day | ORAL | Status: DC
  Administered 2017-10-10 – 2017-10-13 (×4): 12.5 mg via ORAL
  Filled 2017-10-10 (×4): qty 1

## 2017-10-10 MED ORDER — MINERAL OIL LIGHT 100 % EX OIL
TOPICAL_OIL | CUTANEOUS | Status: AC
  Filled 2017-10-10: qty 25

## 2017-10-10 MED ORDER — ACETAMINOPHEN 325 MG PO TABS: 325.0000 mg | ORAL_TABLET | ORAL | Status: DC | PRN

## 2017-10-10 MED ORDER — DEXAMETHASONE SODIUM PHOSPHATE 10 MG/ML IJ SOLN
INTRAMUSCULAR | Status: AC
  Filled 2017-10-10: qty 1

## 2017-10-10 MED ORDER — ASPIRIN EC 81 MG PO TBEC
81.0000 mg | DELAYED_RELEASE_TABLET | ORAL | Status: DC
  Administered 2017-10-13: 81 mg via ORAL

## 2017-10-10 MED ORDER — FENTANYL CITRATE (PF) 100 MCG/2ML IJ SOLN
25.0000 ug | INTRAMUSCULAR | Status: DC | PRN
  Administered 2017-10-10 (×2): 50 ug via INTRAVENOUS

## 2017-10-10 MED ORDER — SODIUM CHLORIDE 0.9 % IV SOLN
INTRAVENOUS | Status: DC
  Administered 2017-10-10: 16:00:00 via INTRAVENOUS

## 2017-10-10 MED ORDER — ACETAMINOPHEN 160 MG/5ML PO SOLN: 325.0000 mg | ORAL | Status: DC | PRN

## 2017-10-10 MED ORDER — CEFAZOLIN SODIUM-DEXTROSE 1-4 GM/50ML-% IV SOLN
1.0000 g | Freq: Three times a day (TID) | INTRAVENOUS | Status: DC
  Administered 2017-10-10 – 2017-10-13 (×8): 1 g via INTRAVENOUS
  Filled 2017-10-10 (×9): qty 50

## 2017-10-10 MED ORDER — LISINOPRIL 20 MG PO TABS
20.0000 mg | ORAL_TABLET | Freq: Every day | ORAL | Status: DC
  Administered 2017-10-10 – 2017-10-13 (×4): 20 mg via ORAL
  Filled 2017-10-10 (×4): qty 1

## 2017-10-10 MED ORDER — METHOCARBAMOL 1000 MG/10ML IJ SOLN
500.0000 mg | Freq: Four times a day (QID) | INTRAVENOUS | Status: DC | PRN
  Filled 2017-10-10: qty 5

## 2017-10-10 MED ORDER — METHOCARBAMOL 500 MG PO TABS
500.0000 mg | ORAL_TABLET | Freq: Four times a day (QID) | ORAL | Status: DC | PRN
  Administered 2017-10-11 (×2): 500 mg via ORAL
  Filled 2017-10-10 (×2): qty 1

## 2017-10-10 MED ORDER — LIDOCAINE 2% (20 MG/ML) 5 ML SYRINGE
INTRAMUSCULAR | Status: DC | PRN
  Administered 2017-10-10: 100 mg via INTRAVENOUS

## 2017-10-10 MED ORDER — MIDAZOLAM HCL 2 MG/2ML IJ SOLN
INTRAMUSCULAR | Status: AC
  Filled 2017-10-10: qty 2

## 2017-10-10 MED ORDER — NICOTINE 21 MG/24HR TD PT24
21.0000 mg | MEDICATED_PATCH | Freq: Every day | TRANSDERMAL | Status: DC
  Administered 2017-10-10 – 2017-10-13 (×4): 21 mg via TRANSDERMAL
  Filled 2017-10-10 (×4): qty 1

## 2017-10-10 MED ORDER — ONDANSETRON HCL 4 MG/2ML IJ SOLN: 4.0000 mg | Freq: Once | INTRAMUSCULAR | Status: DC | PRN

## 2017-10-10 MED ORDER — METOCLOPRAMIDE HCL 5 MG/ML IJ SOLN: 5.0000 mg | Freq: Three times a day (TID) | INTRAMUSCULAR | Status: DC | PRN

## 2017-10-10 MED ORDER — MEPERIDINE HCL 50 MG/ML IJ SOLN: 6.2500 mg | INTRAMUSCULAR | Status: DC | PRN

## 2017-10-10 MED ORDER — MAGNESIUM CITRATE PO SOLN: 1.0000 | Freq: Once | ORAL | Status: DC | PRN

## 2017-10-10 SURGICAL SUPPLY — 52 items
ALLOGRAFT SKIN MESHD 125 SQ CM (Tissue) ×1 IMPLANT
BLADE 10 SAFETY STRL DISP (BLADE) ×3 IMPLANT
BNDG COHESIVE 6X5 TAN STRL LF (GAUZE/BANDAGES/DRESSINGS) IMPLANT
BNDG ESMARK 4X9 LF (GAUZE/BANDAGES/DRESSINGS) ×3 IMPLANT
BNDG GAUZE STRTCH 6 (GAUZE/BANDAGES/DRESSINGS) IMPLANT
COVER SURGICAL LIGHT HANDLE (MISCELLANEOUS) ×6 IMPLANT
CUFF TOURNIQUET SINGLE 18IN (TOURNIQUET CUFF) IMPLANT
CUFF TOURNIQUET SINGLE 24IN (TOURNIQUET CUFF) IMPLANT
DERMACARRIERS GRAFT 1 TO 1.5 (DISPOSABLE) ×3
DRAPE INCISE IOBAN 66X45 STRL (DRAPES) ×6 IMPLANT
DRAPE U-SHAPE 47X51 STRL (DRAPES) ×3 IMPLANT
DRESSING VERAFLO CLEANSE CC (GAUZE/BANDAGES/DRESSINGS) ×1 IMPLANT
DRSG ADAPTIC 3X8 NADH LF (GAUZE/BANDAGES/DRESSINGS) IMPLANT
DRSG MEPITEL 4X7.2 (GAUZE/BANDAGES/DRESSINGS) ×3 IMPLANT
DRSG VERAFLO CLEANSE CC (GAUZE/BANDAGES/DRESSINGS) ×3
DURAPREP 26ML APPLICATOR (WOUND CARE) ×3 IMPLANT
ELECT CAUTERY BLADE 6.4 (BLADE) ×3 IMPLANT
ELECT REM PT RETURN 9FT ADLT (ELECTROSURGICAL) ×3
ELECTRODE REM PT RTRN 9FT ADLT (ELECTROSURGICAL) ×1 IMPLANT
GAUZE SPONGE 4X4 12PLY STRL (GAUZE/BANDAGES/DRESSINGS) IMPLANT
GLOVE BIOGEL PI IND STRL 7.5 (GLOVE) ×1 IMPLANT
GLOVE BIOGEL PI IND STRL 9 (GLOVE) ×1 IMPLANT
GLOVE BIOGEL PI INDICATOR 7.5 (GLOVE) ×2
GLOVE BIOGEL PI INDICATOR 9 (GLOVE) ×2
GLOVE SURG ORTHO 9.0 STRL STRW (GLOVE) ×3 IMPLANT
GLOVE SURG SS PI 6.5 STRL IVOR (GLOVE) ×3 IMPLANT
GOWN STRL REUS W/ TWL XL LVL3 (GOWN DISPOSABLE) ×2 IMPLANT
GOWN STRL REUS W/TWL XL LVL3 (GOWN DISPOSABLE) ×4
GRAFT DERMACARRIERS 1 TO 1.5 (DISPOSABLE) ×1 IMPLANT
KIT BASIN OR (CUSTOM PROCEDURE TRAY) ×3 IMPLANT
KIT DRAINAGE VACCUM ASSIST (KITS) ×3 IMPLANT
KIT ROOM TURNOVER OR (KITS) ×3 IMPLANT
MANIFOLD NEPTUNE II (INSTRUMENTS) ×3 IMPLANT
NEEDLE HYPO 25GX1X1/2 BEV (NEEDLE) IMPLANT
NS IRRIG 1000ML POUR BTL (IV SOLUTION) ×3 IMPLANT
PACK ORTHO EXTREMITY (CUSTOM PROCEDURE TRAY) ×3 IMPLANT
PAD ARMBOARD 7.5X6 YLW CONV (MISCELLANEOUS) ×6 IMPLANT
PAD CAST 4YDX4 CTTN HI CHSV (CAST SUPPLIES) IMPLANT
PADDING CAST COTTON 4X4 STRL (CAST SUPPLIES)
SKIN MESHED 125 SQ CM (Tissue) ×3 IMPLANT
SPONGE LAP 18X18 X RAY DECT (DISPOSABLE) ×3 IMPLANT
STAPLER VISISTAT 35W (STAPLE) ×3 IMPLANT
SUCTION FRAZIER HANDLE 10FR (MISCELLANEOUS)
SUCTION TUBE FRAZIER 10FR DISP (MISCELLANEOUS) IMPLANT
SUT ETHILON 4 0 PS 2 18 (SUTURE) IMPLANT
SYR CONTROL 10ML LL (SYRINGE) IMPLANT
TOWEL OR 17X24 6PK STRL BLUE (TOWEL DISPOSABLE) ×3 IMPLANT
TOWEL OR 17X26 10 PK STRL BLUE (TOWEL DISPOSABLE) ×3 IMPLANT
TUBE CONNECTING 12'X1/4 (SUCTIONS)
TUBE CONNECTING 12X1/4 (SUCTIONS) IMPLANT
WATER STERILE IRR 1000ML POUR (IV SOLUTION) ×3 IMPLANT
YANKAUER SUCT BULB TIP NO VENT (SUCTIONS) ×3 IMPLANT

## 2017-10-10 NOTE — Op Note (Signed)
10/10/2017  11:34 AM  PATIENT:  [name]    PRE-OPERATIVE DIAGNOSIS:  Left Foot Abscess  POST-OPERATIVE DIAGNOSIS:  Same  PROCEDURE:  SKIN GRAFT SPLIT THICKNESS LEFT FOOT, APPLICATION OF WOUND VAC  SURGEON:  Newt Minion, MD  PHYSICIAN ASSISTANT:None ANESTHESIA:   General  PREOPERATIVE INDICATIONS:  [name] is a  47 y.o. female with a diagnosis of Left Foot Abscess who failed conservative measures and elected for surgical management.    The risks benefits and alternatives were discussed with the patient preoperatively including but not limited to the risks of infection, bleeding, nerve injury, cardiopulmonary complications, the need for revision surgery, among others, and the patient was willing to proceed.  OPERATIVE IMPLANTS: Allograft split-thickness skin graft 125 cm. Cleanse choice wound VAC.  @ENCIMAGES @  OPERATIVE FINDINGS: Extensive partial thickness necrotic tissue dorsum left foot.  OPERATIVE PROCEDURE: Patient was brought the operating room and underwent a general anesthetic.  After adequate levels of anesthesia were obtained patient's left lower extremity was prepped using Betadine paint and draped into a sterile field a timeout was called.  Patient had undermining and necrotic skin and superficial fascia.  Using a 21 blade knife and rondure skin fascia and muscle was excised from the dorsum of the foot.  This left a wound that was 11 x 9 cm.  There is also exposed EHL tendon this was also partially excised.  The wound was irrigated with normal saline electrocautery was used for hemostasis.  There was a good petechial bleeding wound bed.  The split-thickness allograft skin graft was applied 125 cm.  This was stapled in place.  A cleanse choice VAC was applied this had a good suction fit patient was extubated taken the PACU in stable condition.   DISCHARGE PLANNING:  Antibiotic duration: Continue antibiotics for 3 days.  Weightbearing:  Nonweightbearing left foot.  Pain medication: High dose protocol ordered  Dressing care/ Wound VAC: Continue wound VAC  Ambulatory devices: Walker or crutches  Discharge to: Plan for discharge to home on Monday.  Follow-up: In the office 1 week post operative.

## 2017-10-10 NOTE — Anesthesia Procedure Notes (Signed)
Procedure Name: LMA Insertion Date/Time: 10/10/2017 11:02 AM Performed by: Genelle Bal, CRNA Pre-anesthesia Checklist: Patient identified, Emergency Drugs available, Suction available and Patient being monitored Patient Re-evaluated:Patient Re-evaluated prior to induction Oxygen Delivery Method: Circle system utilized Preoxygenation: Pre-oxygenation with 100% oxygen Induction Type: IV induction Ventilation: Mask ventilation without difficulty LMA: LMA inserted LMA Size: 4.0 Number of attempts: 1 Airway Equipment and Method: Bite block Placement Confirmation: positive ETCO2 Tube secured with: Tape Dental Injury: Teeth and Oropharynx as per pre-operative assessment

## 2017-10-10 NOTE — Progress Notes (Addendum)
1445 Pt arrived via stretcher from PACU. Pt lethargic, arouse to sternal rubbing. A&Ox4, asking for bed pan. Wound vac to left foot intact. IVF running at [facility]. Pt oriented to room and updated with POC. Pt's mom entered room and pt suddenly starts crying out in severe pain. Pt asked what medications she had for pain and RN informed her. Pt instructed RN to call Dr. Jeanett Schlein stating that 1mg  of dilaudid isn't "shit" for an IV drug user and she's not going to sit here and suffer. Pt then asking about lunch tray, coke to drink, and elevating her leg. Pt c/o BP cuff being too tight. RN informed pt that we are going to try the 1mg  of dilaudid for pain and if it doesn't work Rn will call MD. RN awaiting MAR medication post surgery to be verified by Rx. RN assisted pt in calling food services and placing lunch/dinner order. Fall precautions in place. WCTM.   Weir Kim medicated pt for pain. Kim informed RN she is also asking for anxiety meds. Informed pt that RN would bring in with rest of daily meds.   1600 Rn medicated per MAR. Pt still c/o pain. Repositioned pt and LLE on pillow. WCTM.   1700 RN to room for beeping IV. Pt very lethargic. Sternal rubbed to arousal. Pt woke up and stated she was in severe pain. Informed pt that she was sound asleep. Pt attempted to answer but quickly fell back to sleep.  1930 Bedside report with Webb Silversmith, RN. Pt still sleeping. Mom at bedside. Pt woke up and asked about dinner tray. Pt's mother informed pt that she ate it already. Pt fell back to sleep. Wound vac intact, VSS, lines checked and verified.

## 2017-10-10 NOTE — H&P (Signed)
MARGURETE GUAMAN is an 47 y.o. female.   Chief complaint: Ulceration dorsum left foot. HPI: Patient is a 47 year old woman who is status post left foot irrigation and debridement of an abscess.  Patient was discharged with a Praveena wound VAC.  Patient returns with increasing ulceration over the dorsum of the foot.  Past Medical History:  Diagnosis Date  . Anxiety   . Arthritis   . Cancer ([place])   . Cervical cancer ([place])   . Depressed   . Drug addiction ([place]) Hep C  . Foot abscess, left   . Gastroesophageal reflux disease   . Gastroparesis   . Hepatitis C    pt denies any treatment  . Hypertension   . Polysubstance dependence including opioid type drug, episodic abuse ([place])   . Psoriasis   . Renal disorder    kidney stones    Past Surgical History:  Procedure Laterality Date  . APPENDECTOMY    . ESOPHAGOGASTRODUODENOSCOPY  01/04/2008   Dr Lamar Laundry reflux esophagitis, sm HH, gastritis, BRAVO positive for GERD on BID PPI  . I&D EXTREMITY Left 10/03/2017   Procedure: IRRIGATION AND DEBRIDEMENT EXTREMITY;  Surgeon: Newt Minion, MD;  Location: [facility];  Service: Orthopedics;  Laterality: Left;  . IRRIGATION AND DEBRIDEMENT FOOT Left 10/03/2017  . LASER ABLATION CONDYLOMA CERVICAL / VULVAR    . mandible surgery for fracture    . TUBAL LIGATION      Family History  Problem Relation Age of Onset  . Hyperlipidemia Mother   . Diabetes Mother   . Hypertension Mother   . Hypertension Sister   . Hepatitis C Sister   . Hypertension Father   . Colon cancer Neg Hx   . Liver disease Neg Hx    Social History:  reports that she has been smoking cigarettes.  She has a 28.00 pack-year smoking history. She has never used smokeless tobacco. She reports that she has current or past drug history. Drugs: Cocaine, IV, "Crack" cocaine, Marijuana, and Opium. She reports that she does not drink alcohol.  Allergies:  Allergies  Allergen Reactions  . Tramadol Hcl Anaphylaxis, Hives,  Itching and Swelling    Of tongue  . Ketorolac Nausea And Vomiting, Rash and Other (See Comments)  . Propoxyphene Rash    No medications prior to admission.    No results found for this or any previous visit (from the past 48 hour(s)). No results found.  Review of Systems  All other systems reviewed and are negative.   Last menstrual period 01/20/2015. Physical Exam   Assessment/Plan Assessment: Worsening of the ulceration dorsum left foot.  Plan: We will plan for revision irrigation and debridement placement of an installation wound VAC.  Risks and benefits were discussed including need for additional surgery.  Patient states she understands wished to proceed at this time anticipate possible placement of split-thickness skin graft may need to apply installation wound VAC therapy with return to surgery on Wednesday.  Newt Minion, MD 10/10/2017, 7:18 AM

## 2017-10-10 NOTE — Transfer of Care (Signed)
Immediate Anesthesia Transfer of Care Note  Patient: [name]  Procedure(s) Performed: SKIN GRAFT SPLIT THICKNESS LEFT FOOT (Left Foot) APPLICATION OF WOUND VAC (Left Foot)  Patient Location: PACU  Anesthesia Type:General  Level of Consciousness: awake, alert  and oriented  Airway & Oxygen Therapy: Patient Spontanous Breathing and Patient connected to face mask oxygen  Post-op Assessment: Report given to RN and Post -op Vital signs reviewed and stable  Post vital signs: Reviewed and stable  Last Vitals:  Vitals Value Taken Time  BP 192/118 10/10/2017 11:46 AM  Temp    Pulse 92 10/10/2017 11:46 AM  Resp 17 10/10/2017 11:46 AM  SpO2 100 % 10/10/2017 11:46 AM  Vitals shown include unvalidated device data.  Last Pain:  Vitals:   10/10/17 0852  TempSrc:   PainSc: 9       Patients Stated Pain Goal: 3 (38/88/28 0034)  Complications: No apparent anesthesia complications

## 2017-10-10 NOTE — Anesthesia Postprocedure Evaluation (Signed)
Anesthesia Post Note  Patient: [name]  Procedure(s) Performed: SKIN GRAFT SPLIT THICKNESS LEFT FOOT (Left Foot) APPLICATION OF WOUND VAC (Left Foot)     Patient location during evaluation: PACU Anesthesia Type: General Level of consciousness: sedated Pain management: pain level controlled Vital Signs Assessment: post-procedure vital signs reviewed and stable Respiratory status: spontaneous breathing Cardiovascular status: blood pressure returned to baseline and stable Postop Assessment: no apparent nausea or vomiting Anesthetic complications: no    Last Vitals:  Vitals:   10/10/17 1230 10/10/17 1245  BP: (!) 189/108 (!) 149/92  Pulse: 88 70  Resp: 11 11  Temp:  36.4 C  SpO2: 98% 92%    Last Pain:  Vitals:   10/10/17 1245  TempSrc:   PainSc: Asleep   Pain Goal: Patients Stated Pain Goal: 3 (10/10/17 0852)  LLE Motor Response: Purposeful movement;Responds to commands (10/10/17 1245) LLE Sensation: Full sensation (10/10/17 1245)          [place]

## 2017-10-10 NOTE — Anesthesia Preprocedure Evaluation (Signed)
Anesthesia Evaluation  Patient identified by MRN, date of birth, ID band Patient awake    Reviewed: Allergy & Precautions, H&P , NPO status , Patient's Chart, lab work & pertinent test results  Airway Mallampati: II  TM Distance: >3 FB Neck ROM: Full    Dental  (+) Edentulous Upper, Edentulous Lower   Pulmonary Current Smoker,    breath sounds clear to auscultation       Cardiovascular hypertension, Pt. on medications Normal cardiovascular exam Rhythm:Regular Rate:Normal     Neuro/Psych PSYCHIATRIC DISORDERS Anxiety Depression  Neuromuscular disease    GI/Hepatic PUD, GERD  ,(+)     substance abuse  cocaine use, marijuana use and IV drug use, Hepatitis -, C  Endo/Other    Renal/GU      Musculoskeletal   Abdominal (+) + obese,   Peds  Hematology   Anesthesia Other Findings   Reproductive/Obstetrics                             Anesthesia Physical  Anesthesia Plan  ASA: III  Anesthesia Plan: General   Post-op Pain Management:    Induction: Intravenous  PONV Risk Score and Plan: 3 and Ondansetron and Dexamethasone  Airway Management Planned: LMA  Additional Equipment:   Intra-op Plan:   Post-operative Plan:   Informed Consent: I have reviewed the patients History and Physical, chart, labs and discussed the procedure including the risks, benefits and alternatives for the proposed anesthesia with the patient or authorized representative who has indicated his/her understanding and acceptance.   Dental advisory given  Plan Discussed with: CRNA and Surgeon  Anesthesia Plan Comments: (  )        Anesthesia Quick Evaluation

## 2017-10-11 MED ORDER — PANTOPRAZOLE SODIUM 40 MG PO TBEC
40.0000 mg | DELAYED_RELEASE_TABLET | Freq: Every day | ORAL | Status: DC
  Administered 2017-10-11 – 2017-10-13 (×3): 40 mg via ORAL
  Filled 2017-10-11 (×3): qty 1

## 2017-10-11 NOTE — Care Management (Signed)
Attempted to start process of getting DME [employer] wound vac under charity.  Left VM for [employer] rep.  Paperwork on shadow chart.

## 2017-10-11 NOTE — Progress Notes (Signed)
PT Cancellation Note  Patient Details Name: [name] MRN: [id number] DOB: 22-Jan-1971   Cancelled Treatment:    Reason Eval/Treat Not Completed: Other (comment).  In a lot of pain and working with nursing to manage it.  Will try later to see how she is feeling.   Ramond Dial 10/11/2017, 11:10 AM   Mee Hives, PT MS Acute Rehab Dept. Number: [place] and [place]

## 2017-10-11 NOTE — Progress Notes (Addendum)
Patient angry / demanding Dilaudid for pain.  I came in to give her her medication - including pain medication - at 1045 h and every 5 minutes afterwards, finally having to awaken her as she had requested pain medication (not specific) at approx 1030 as per unit secretary.  I brought her oxycodone, which she stated she did not want and wanted her scheduled Dilaudid.  I told her that I could not justify giving Dilaudid at this time as she was asleep and looked quite drowsy on assessment.  I would be happy to give her her Dilaudid if the oxycodone were ineffective within an hour or so.  Patient not happy and yelled out / wanting to speak with charge nurse and have MD called.  Called this nurse a liar and stated that she was awake at 1045.

## 2017-10-11 NOTE — Progress Notes (Signed)
Subjective: 1 Day Post-Op Procedure(s) (LRB): SKIN GRAFT SPLIT THICKNESS LEFT FOOT (Left) APPLICATION OF WOUND VAC (Left) Patient reports pain as severe.    Objective: Vital signs in last 24 hours: Temp:  [97.3 F (36.3 C)-98.4 F (36.9 C)] 98.3 F (36.8 C) (03/23 0538) Pulse Rate:  [70-93] 71 (03/23 0538) Resp:  [11-18] 15 (03/23 0538) BP: (120-205)/(69-118) 120/69 (03/23 0538) SpO2:  [92 %-100 %] 99 % (03/23 0538) Weight:  [170 lb (77.1 kg)-173 lb 8 oz (78.7 kg)] 173 lb 8 oz (78.7 kg) (03/22 1559)  Intake/Output from previous day: 03/22 0701 - 03/23 0700 In: 860.2 [P.O.:120; I.V.:740.2] Out: 405 [Urine:350; Drains:50; Blood:5] Intake/Output this shift: No intake/output data recorded.  No results for input(s): HGB in the last 72 hours. No results for input(s): WBC, RBC, HCT, PLT in the last 72 hours. No results for input(s): NA, K, CL, CO2, BUN, CREATININE, GLUCOSE, CALCIUM in the last 72 hours. No results for input(s): LABPT, INR in the last 72 hours.  Wound vac intact  Assessment/Plan: 1 Day Post-Op Procedure(s) (LRB): SKIN GRAFT SPLIT THICKNESS LEFT FOOT (Left) APPLICATION OF WOUND VAC (Left) prilosec for heartburn,consider D/C Mon  Garald Balding 10/11/2017, 8:13 AM

## 2017-10-11 NOTE — Progress Notes (Signed)
PT Cancellation Note  Patient Details Name: [name] MRN: [id number] DOB: 1971/05/06   Cancelled Treatment:    Reason Eval/Treat Not Completed: Other (comment).  Reattempted visit and pt reports she is not going to be able to get OOB due to having IV on her R foot.  Will ck in the AM to see if she is going to attempt to get OOB.   Ramond Dial 10/11/2017, 5:13 PM   Mee Hives, PT MS Acute Rehab Dept. Number: [place] and [place]

## 2017-10-12 NOTE — Progress Notes (Addendum)
PT Cancellation Note  Patient Details Name: [name] MRN: [id number] DOB: Dec 08, 1970   Cancelled Treatment:    Reason Eval/Treat Not Completed: Pain limiting ability to participate. Pt declining participation in PT eval stating she is in too much pain. Pt, however, was sound asleep upon therapist arrival. Pt now requesting pain meds. RN notified. PT to re-attempt at later time.  1347 addendum: Re-attempted. Pt refusing due to wanting to sleep.   Lorriane Shire 10/12/2017, 9:26 AM  Lorrin Goodell, PT  Office # [phone] Pager [phone]

## 2017-10-12 NOTE — Progress Notes (Signed)
Subjective: 2 Days Post-Op Procedure(s) (LRB): SKIN GRAFT SPLIT THICKNESS LEFT FOOT (Left) APPLICATION OF WOUND VAC (Left) Patient reports pain as moderate.  Appears controlled with present regimen  Objective: Vital signs in last 24 hours: Temp:  [98.1 F (36.7 C)-98.5 F (36.9 C)] 98.2 F (36.8 C) (03/24 9794) Pulse Rate:  [62-82] 62 (03/24 0633) Resp:  [16-20] 20 (03/24 8016) BP: (127-149)/(62-99) 149/99 (03/24 5537) SpO2:  [99 %] 99 % (03/24 4827)  Intake/Output from previous day: 03/23 0701 - 03/24 0700 In: 940 [P.O.:720; I.V.:120; IV Piggyback:100] Out: 4250 [Urine:4250] Intake/Output this shift: No intake/output data recorded.  No results for input(s): HGB in the last 72 hours. No results for input(s): WBC, RBC, HCT, PLT in the last 72 hours. No results for input(s): NA, K, CL, CO2, BUN, CREATININE, GLUCOSE, CALCIUM in the last 72 hours. No results for input(s): LABPT, INR in the last 72 hours.  ABD soft No cellulitis present Compartment soft  Assessment/Plan: 2 Days Post-Op Procedure(s) (LRB): SKIN GRAFT SPLIT THICKNESS LEFT FOOT (Left) APPLICATION OF WOUND VAC (Left) Comfortable this am. Vac inplace to left foot, no calf pain awake alert-Dr Sharol Given to decide on D/C tomorrow  Garald Balding 10/12/2017, 7:41 AM

## 2017-10-12 NOTE — Plan of Care (Signed)
  Problem: Clinical Measurements: Goal: Respiratory complications will improve Outcome: Progressing   Problem: Pain Managment: Goal: General experience of comfort will improve Outcome: Not Progressing   Problem: Safety: Goal: Ability to remain free from injury will improve Outcome: Progressing

## 2017-10-13 ENCOUNTER — Encounter (HOSPITAL_COMMUNITY): Payer: Self-pay | Admitting: Orthopedic Surgery

## 2017-10-13 MED ORDER — OXYCODONE-ACETAMINOPHEN 5-325 MG PO TABS: 1.0000 | ORAL_TABLET | Freq: Three times a day (TID) | ORAL | 0 refills | Status: DC | PRN

## 2017-10-13 NOTE — Plan of Care (Signed)

## 2017-10-13 NOTE — Discharge Summary (Signed)
Discharge Diagnoses:  Active Problems:   Wound, open, foot with complication, left, sequela   Surgeries: Procedure(s): SKIN GRAFT SPLIT THICKNESS LEFT FOOT APPLICATION OF WOUND VAC on 10/10/2017    Consultants:   Discharged Condition: Improved  Hospital Course: [name] is an 47 y.o. female who was admitted 10/10/2017 with a chief complaint of wound left foot, with a final diagnosis of Left Foot Abscess.  Patient was brought to the operating room on 10/10/2017 and underwent Procedure(s): SKIN GRAFT SPLIT THICKNESS LEFT FOOT APPLICATION OF WOUND VAC.    Patient was given perioperative antibiotics:  Anti-infectives (From admission, onward)   Start     Dose/Rate Route Frequency Ordered Stop   10/10/17 1900  ceFAZolin (ANCEF) IVPB 1 g/50 mL premix     1 g 100 mL/hr over 30 Minutes Intravenous Every 8 hours 10/10/17 1445 10/13/17 2159    .  Patient was given sequential compression devices, early ambulation, and aspirin for DVT prophylaxis.  Recent vital signs:  Patient Vitals for the past 24 hrs:  BP Temp Temp src Pulse Resp SpO2  10/13/17 0439 128/81 98 F (36.7 C) Oral 63 18 97 %  10/12/17 1945 (!) 124/94 98.6 F (37 C) Oral 67 20 98 %  10/12/17 1300 (!) 142/85 98.1 F (36.7 C) Oral 65 20 99 %  .  Recent laboratory studies: No results found.  Discharge Medications:   Allergies as of 10/13/2017      Reactions   Tramadol Hcl Anaphylaxis, Hives, Itching, Swelling   Of tongue   Ketorolac Nausea And Vomiting, Rash, Other (See Comments)   Propoxyphene Rash      Medication List    TAKE these medications   albuterol 108 (90 Base) MCG/ACT inhaler Commonly known as:  PROVENTIL HFA;VENTOLIN HFA Inhale 1-2 puffs into the lungs every 6 (six) hours as needed for wheezing or shortness of breath.   amLODipine 10 MG tablet Commonly known as:  NORVASC Take 1 tablet (10 mg total) by mouth daily.   aspirin EC 81 MG tablet Take 81 mg by mouth 2 (two) times a week.    clonazePAM 1 MG tablet Commonly known as:  KLONOPIN Take 1 mg by mouth 3 (three) times daily.   doxycycline 50 MG capsule Commonly known as:  VIBRAMYCIN Take 2 capsules (100 mg total) by mouth 2 (two) times daily.   GOODYS EXTRA STRENGTH PO Take 1 Package by mouth daily as needed (pain).   ibuprofen 200 MG tablet Commonly known as:  ADVIL,MOTRIN Take 800 mg by mouth every 6 (six) hours as needed for moderate pain.   lisinopril-hydrochlorothiazide 20-12.5 MG tablet Commonly known as:  PRINZIDE,ZESTORETIC Take 1 tablet by mouth daily.   metoprolol succinate 25 MG 24 hr tablet Commonly known as:  TOPROL-XL Take 1 tablet (25 mg total) by mouth daily.   nicotine 21 mg/24hr patch Commonly known as:  NICODERM CQ - dosed in mg/24 hours Place 1 patch (21 mg total) onto the skin daily.   oxyCODONE-acetaminophen 7.5-325 MG tablet Commonly known as:  PERCOCET Take 1 tablet by mouth 2 (two) times daily as needed for moderate pain or severe pain. What changed:  Another medication with the same name was added. Make sure you understand how and when to take each.   oxyCODONE-acetaminophen 5-325 MG tablet Commonly known as:  PERCOCET/ROXICET Take 1 tablet by mouth every 6 (six) hours as needed for severe pain. What changed:  Another medication with the same name was added. Make sure you  understand how and when to take each.   oxyCODONE-acetaminophen 5-325 MG tablet Commonly known as:  PERCOCET/ROXICET Take 1 tablet by mouth every 8 (eight) hours as needed for severe pain. What changed:  You were already taking a medication with the same name, and this prescription was added. Make sure you understand how and when to take each.            Discharge Care Instructions  (From admission, onward)        Start     Ordered   10/13/17 0000  Non weight bearing    Question Answer Comment  Laterality left   Extremity Lower      10/13/17 0719      Diagnostic Studies: Dg Ankle Complete  Left  Result Date: 10/02/2017 CLINICAL DATA:  Abscess on top of foot. EXAM: LEFT ANKLE COMPLETE - 3+ VIEW COMPARISON:  None. FINDINGS: Three-view exam left ankle shows no acute fracture. No subluxation or dislocation. No worrisome lytic or sclerotic osseous abnormality. Diffuse soft tissue swelling is evident without radiopaque soft tissue foreign body. IMPRESSION: Diffuse soft tissue swelling without underlying bony abnormality. Electronically Signed   By: Misty Stanley M.D.   On: 10/02/2017 13:55   Mr Foot Left Wo Contrast  Result Date: 10/02/2017 CLINICAL DATA:  Patient was shooting up cocaine 4 days ago and her left foot. Foot abscess. EXAM: MRI OF THE LEFT FOOT WITHOUT CONTRAST TECHNIQUE: Multiplanar, multisequence MR imaging of the was performed. No intravenous contrast was administered. COMPARISON:  Ankle radiographs 10/02/2017 FINDINGS: Limited study as the patient Bones/Joint/Cartilage No evidence of acute fracture, joint dislocation nor joint effusion. The distal tibia and fibular intact. Subchondral cystic change likely degenerative in etiology deep to the medial talar dome is noted. No marrow signal abnormality to indicate acute osteomyelitis. Ligaments Intact Muscles and Tendons Tenosynovitis of the extensor hallucis longus tendon as well as extensor tendons to the third and fourth rays. No tendon rupture is identified. Given patient's history of IV drug abuse, infectious tenosynovitis is a possibility. No evidence of pyomyositis or myositis. Soft tissues 18 x 11 x 9 mm dorsal soft tissue fluid collection compatible with a subcutaneous abscess overlying the dorsum of the proximal first metatarsal. Subcutaneous edema about the included ankle and dorsum of the foot consistent with cellulitis. IMPRESSION: 1. No marrow signal abnormality suspicious for acute osteomyelitis. Degenerative subchondral cystic change of the medial talar dome is noted at the ankle. 2. Fluid outlining extensor tendons to the  toes most prominently involving the extensor hallucis longus and to a lesser degree the extensor tendons to the third and fourth rays. Given history of intravenous injections 2 foot, the possibility of infected tenosynovitis is raised. Over the dorsum of the first metatarsal is a subcutaneous fluid collection measuring 18 x 11 x 9 mm that may represent a small subcutaneous abscess or iatrogenically introduced fluid. Electronically Signed   By: Ashley Royalty M.D.   On: 10/02/2017 15:20    Patient benefited maximally from their hospital stay and there were no complications.     Disposition: Discharge disposition: 01-Home or Self Care      Discharge Instructions    Call MD / Call 911   Complete by:  As directed    If you experience chest pain or shortness of breath, CALL 911 and be transported to the hospital emergency room.  If you develope a fever above 101 F, pus (white drainage) or increased drainage or redness at the wound, or calf pain,  call your surgeon's office.   Constipation Prevention   Complete by:  As directed    Drink plenty of fluids.  Prune juice may be helpful.  You may use a stool softener, such as Colace (over the counter) 100 mg twice a day.  Use MiraLax (over the counter) for constipation as needed.   Diet - low sodium heart healthy   Complete by:  As directed    Increase activity slowly as tolerated   Complete by:  As directed    Negative Pressure Wound Therapy - Incisional   Complete by:  As directed    Attached to the wound VAC dressing to the portable Praveena wound VAC pump for discharge.   Non weight bearing   Complete by:  As directed    Laterality:  left   Extremity:  Lower     Follow-up Information    Newt Minion, MD Follow up in 1 week(s).   Specialty:  Orthopedic Surgery Contact information: [place] [address] [phone]            Signed: Newt Minion 10/13/2017, 7:20 AM

## 2017-10-16 ENCOUNTER — Other Ambulatory Visit (INDEPENDENT_AMBULATORY_CARE_PROVIDER_SITE_OTHER): Payer: Self-pay | Admitting: Orthopedic Surgery

## 2017-10-16 MED ORDER — OXYCODONE-ACETAMINOPHEN 5-325 MG PO TABS: 1.0000 | ORAL_TABLET | Freq: Three times a day (TID) | ORAL | 0 refills | Status: DC | PRN

## 2017-10-17 ENCOUNTER — Ambulatory Visit (INDEPENDENT_AMBULATORY_CARE_PROVIDER_SITE_OTHER): Payer: Medicaid Other

## 2017-10-17 ENCOUNTER — Encounter (INDEPENDENT_AMBULATORY_CARE_PROVIDER_SITE_OTHER): Payer: Self-pay

## 2017-10-17 VITALS — Ht 62.0 in | Wt 173.0 lb

## 2017-10-17 DIAGNOSIS — L03119 Cellulitis of unspecified part of limb: Principal | ICD-10-CM

## 2017-10-17 DIAGNOSIS — L02619 Cutaneous abscess of unspecified foot: Secondary | ICD-10-CM

## 2017-10-17 NOTE — Progress Notes (Signed)
Patient is in the office today 1 week s/p repeat I&D left foot and application of STSG with prevena wound vac. The canister was full and vac was alarming. The pt states that it has been doing this for the past 2 days.  She is non weight bearing in a wheelchair and states that she has been keeping her foot elevated higher that her heart. The vac was removed and the skin graft and staples are intact. There is no redness and minimal swelling. Adaptic, ABD and ace bandage are applied wrapping the leg from the toes to below the knee. Additional supplies have been given to the pt. Advised that she can wash in between her toes and around the graft but not directly on the graft itself. Patient and her mother voice understanding of this. Advised signs and symptoms to watch for and advised to call with any questions or concerns. Rx for Percocet was written by Dr. Sharol Given yesterday and this was given to the pt today. Appt made for follow up in one week with Dr. Sharol Given.    Lacy Sofia, [place], [employer]

## 2017-10-23 ENCOUNTER — Ambulatory Visit (INDEPENDENT_AMBULATORY_CARE_PROVIDER_SITE_OTHER): Payer: Self-pay | Admitting: Orthopedic Surgery

## 2017-10-23 ENCOUNTER — Encounter (INDEPENDENT_AMBULATORY_CARE_PROVIDER_SITE_OTHER): Payer: Self-pay | Admitting: Orthopedic Surgery

## 2017-10-23 VITALS — Ht 62.0 in | Wt 173.0 lb

## 2017-10-23 DIAGNOSIS — L02612 Cutaneous abscess of left foot: Secondary | ICD-10-CM

## 2017-10-23 MED ORDER — OXYCODONE-ACETAMINOPHEN 5-325 MG PO TABS: 1.0000 | ORAL_TABLET | Freq: Four times a day (QID) | ORAL | 0 refills | Status: DC | PRN

## 2017-10-23 NOTE — Progress Notes (Signed)
Office Visit Note   Patient: [name]           Date of Birth: 03/10/1971           MRN: [id number] Visit Date: 10/23/2017              Requested by: [facility] [place], [place] [address] PCP: [facility]  Chief Complaint  Patient presents with  . Left Foot - Routine Post Op    10/10/17 repeat I&D, split thickness skin graft left foot      HPI: Patient presents for follow-up status post split-thickness skin graft left foot.  Patient has no complaints she has been using Adaptic 4 x 4's and Ace wrap changing this daily.  Assessment & Plan: Visit Diagnoses:  1. Cutaneous abscess of left foot     Plan: Continue with current wound care continue with elevation continue with range of motion of the toes and ankles to help the calf pump decreased swelling.  Follow-Up Instructions: Return in about 1 week (around 10/30/2017).   Ortho Exam  Patient is alert, oriented, no adenopathy, well-dressed, normal affect, normal respiratory effort. Examination the wound bed has good granulation tissue there is good granulation extending up through the skin graft.  There is no redness no cellulitis no odor no drainage no signs of infection.  Imaging: No results found.   Labs: Lab Results  Component Value Date   HGBA1C 5.6 09/15/2008   REPTSTATUS 10/08/2017 FINAL 10/03/2017   GRAMSTAIN  10/03/2017    RARE WBC PRESENT, PREDOMINANTLY PMN FEW GRAM POSITIVE COCCI IN PAIRS    CULT  10/03/2017    MODERATE METHICILLIN RESISTANT STAPHYLOCOCCUS AUREUS NO ANAEROBES ISOLATED Performed at [facility], [place] [address] [place], [place] [address]    LABORGA METHICILLIN RESISTANT STAPHYLOCOCCUS AUREUS 10/03/2017    @LABSALLVALUES (HGBA1)@  Body mass index is 31.64 kg/m.  Orders:  No orders of the defined types were placed in this encounter.  No orders of the defined types were placed in this encounter.    Procedures: No procedures performed  Clinical Data: No additional findings.  ROS:  All other systems negative, except as noted in the HPI. Review of Systems  Objective: Vital Signs: Ht 5\' 2"  (1.575 m)   Wt 173 lb (78.5 kg)   LMP 01/20/2015   BMI 31.64 kg/m   Specialty Comments:  No specialty comments available.  PMFS History: Patient Active Problem List   Diagnosis Date Noted  . Wound, open, foot with complication, left, sequela 10/10/2017  . Cellulitis and abscess of foot 10/02/2017  . Chronic pelvic pain in female 03/23/2015  . Cancer ([place])   . Hematemesis 05/27/2013  . Urinary tract infection, site not specified 09/10/2012  . Polysubstance dependence including opioid type drug, episodic abuse ([place]) 09/10/2012  . Gram-negative bacteremia 09/07/2012  . Acute renal failure ([place]) 09/07/2012  . Anemia 09/06/2012  . Hypokalemia 09/06/2012  . Blood in stool 09/06/2012  . Tobacco abuse 08/05/2012  . H/O intravenous drug use in remission 08/05/2012  . Hyponatremia 08/05/2012  . Psoas abscess, left ([place]) 08/05/2012  . GASTROPARESIS 09/23/2008  . NECK PAIN 11/12/2007  . HYPERLIPIDEMIA 07/10/2007  . ANXIETY 07/10/2007  . DEPRESSION 07/10/2007  . HYPERTENSION 07/10/2007  . GERD 07/10/2007  . PEPTIC ULCER DISEASE 07/10/2007  . UNSPECIFIED DISORDER OF UTERUS 07/10/2007  . PSORIASIS 07/10/2007  . ARTHRITIS 07/10/2007  . LOW BACK PAIN 07/10/2007  . WEIGHT  GAIN, ABNORMAL 07/10/2007  . COUGH 07/10/2007  . CERVICAL CANCER, HX OF 07/10/2007  . MIGRAINES, HX OF 07/10/2007   Past Medical History:  Diagnosis Date  . Anxiety   . Arthritis   . Cancer ([place])   . Cervical cancer ([place])   . Depressed   . Drug addiction ([place]) Hep C  . Foot abscess, left   . Gastroesophageal reflux disease   . Gastroparesis   . Hepatitis C    pt denies any treatment  . Hypertension   . Polysubstance dependence including opioid type drug, episodic abuse ([place])   . Psoriasis   . Renal disorder     kidney stones    Family History  Problem Relation Age of Onset  . Hyperlipidemia Mother   . Diabetes Mother   . Hypertension Mother   . Hypertension Sister   . Hepatitis C Sister   . Hypertension Father   . Colon cancer Neg Hx   . Liver disease Neg Hx     Past Surgical History:  Procedure Laterality Date  . APPENDECTOMY    . APPLICATION OF WOUND VAC Left 10/10/2017   Procedure: APPLICATION OF WOUND VAC;  Surgeon: Newt Minion, MD;  Location: [facility];  Service: Orthopedics;  Laterality: Left;  . ESOPHAGOGASTRODUODENOSCOPY  01/04/2008   Dr Lamar Laundry reflux esophagitis, sm HH, gastritis, BRAVO positive for GERD on BID PPI  . I&D EXTREMITY Left 10/03/2017   Procedure: IRRIGATION AND DEBRIDEMENT EXTREMITY;  Surgeon: Newt Minion, MD;  Location: [facility];  Service: Orthopedics;  Laterality: Left;  . IRRIGATION AND DEBRIDEMENT FOOT Left 10/03/2017  . LASER ABLATION CONDYLOMA CERVICAL / VULVAR    . mandible surgery for fracture    . SKIN SPLIT GRAFT Left 10/10/2017   Procedure: SKIN GRAFT SPLIT THICKNESS LEFT FOOT;  Surgeon: Newt Minion, MD;  Location: [facility];  Service: Orthopedics;  Laterality: Left;  . TUBAL LIGATION     Social History   Occupational History  . Occupation: disabled    Fish farm manager: UNEMPLOYED  Tobacco Use  . Smoking status: Current Every Day Smoker    Packs/day: 1.00    Years: 28.00    Pack years: 28.00    Types: Cigarettes  . Smokeless tobacco: Never Used  Substance and Sexual Activity  . Alcohol use: No    Comment: occ ( denies 3/ 21/19)  . Drug use: Yes    Types: Cocaine, IV, "Crack" cocaine, Marijuana, Opium    Comment: herione last week. ( denies 10/10/17)  . Sexual activity: Not Currently    Birth control/protection: None, Surgical

## 2017-10-30 ENCOUNTER — Ambulatory Visit (INDEPENDENT_AMBULATORY_CARE_PROVIDER_SITE_OTHER): Payer: Medicaid Other | Admitting: Orthopedic Surgery

## 2017-11-10 ENCOUNTER — Ambulatory Visit (INDEPENDENT_AMBULATORY_CARE_PROVIDER_SITE_OTHER): Payer: Self-pay | Admitting: Orthopedic Surgery

## 2017-11-10 ENCOUNTER — Encounter (INDEPENDENT_AMBULATORY_CARE_PROVIDER_SITE_OTHER): Payer: Self-pay | Admitting: Orthopedic Surgery

## 2017-11-10 DIAGNOSIS — L02612 Cutaneous abscess of left foot: Secondary | ICD-10-CM

## 2017-11-10 MED ORDER — OXYCODONE-ACETAMINOPHEN 5-325 MG PO TABS: 1.0000 | ORAL_TABLET | Freq: Three times a day (TID) | ORAL | 0 refills | Status: DC | PRN

## 2017-11-10 NOTE — Progress Notes (Signed)
Office Visit Note   Patient: [name]           Date of Birth: July 31, 1970           MRN: [id number] Visit Date: 11/10/2017              Requested by: [facility] [place], [place] [address] PCP: [facility]  Chief Complaint  Patient presents with  . Left Foot - Follow-up, Routine Post Op      HPI: Patient is a 47 year old woman presents follow-up status post split-thickness skin graft left foot.  Assessment & Plan: Visit Diagnoses:  1. Cutaneous abscess of left foot     Plan: We will harvest the staples today patient is given a prescription to start wearing the medical compression stocking to wear this around the clock change daily with washing with soap and water daily.  Follow-Up Instructions: Return in about 2 weeks (around 11/24/2017).   Ortho Exam  Patient is alert, oriented, no adenopathy, well-dressed, normal affect, normal respiratory effort. Examination patient has 90% healthy granulation tissue.  There is a small amount of fibrinous exudative tissue she does have swelling there is no redness no cellulitis no exposed bone or tendon no signs of infection.  Imaging: No results found.   Labs: Lab Results  Component Value Date   HGBA1C 5.6 09/15/2008   REPTSTATUS 10/08/2017 FINAL 10/03/2017   GRAMSTAIN  10/03/2017    RARE WBC PRESENT, PREDOMINANTLY PMN FEW GRAM POSITIVE COCCI IN PAIRS    CULT  10/03/2017    MODERATE METHICILLIN RESISTANT STAPHYLOCOCCUS AUREUS NO ANAEROBES ISOLATED Performed at [facility], [place] [address], [place], [place] [address]    LABORGA METHICILLIN RESISTANT STAPHYLOCOCCUS AUREUS 10/03/2017    @LABSALLVALUES (HGBA1)@  There is no height or weight on file to calculate BMI.  Orders:  No orders of the defined types were placed in this encounter.  No orders of the defined types were placed in this encounter.    Procedures: No procedures  performed  Clinical Data: No additional findings.  ROS:  All other systems negative, except as noted in the HPI. Review of Systems  Objective: Vital Signs: LMP 01/20/2015   Specialty Comments:  No specialty comments available.  PMFS History: Patient Active Problem List   Diagnosis Date Noted  . Wound, open, foot with complication, left, sequela 10/10/2017  . Cellulitis and abscess of foot 10/02/2017  . Chronic pelvic pain in female 03/23/2015  . Cancer ([place])   . Hematemesis 05/27/2013  . Urinary tract infection, site not specified 09/10/2012  . Polysubstance dependence including opioid type drug, episodic abuse ([place]) 09/10/2012  . Gram-negative bacteremia 09/07/2012  . Acute renal failure ([place]) 09/07/2012  . Anemia 09/06/2012  . Hypokalemia 09/06/2012  . Blood in stool 09/06/2012  . Tobacco abuse 08/05/2012  . H/O intravenous drug use in remission 08/05/2012  . Hyponatremia 08/05/2012  . Psoas abscess, left ([place]) 08/05/2012  . GASTROPARESIS 09/23/2008  . NECK PAIN 11/12/2007  . HYPERLIPIDEMIA 07/10/2007  . ANXIETY 07/10/2007  . DEPRESSION 07/10/2007  . HYPERTENSION 07/10/2007  . GERD 07/10/2007  . PEPTIC ULCER DISEASE 07/10/2007  . UNSPECIFIED DISORDER OF UTERUS 07/10/2007  . PSORIASIS 07/10/2007  . ARTHRITIS 07/10/2007  . LOW BACK PAIN 07/10/2007  . WEIGHT GAIN, ABNORMAL 07/10/2007  . COUGH 07/10/2007  . CERVICAL CANCER, HX OF 07/10/2007  . MIGRAINES, HX OF 07/10/2007   Past Medical History:  Diagnosis Date  .  Anxiety   . Arthritis   . Cancer ([place])   . Cervical cancer ([place])   . Depressed   . Drug addiction ([place]) Hep C  . Foot abscess, left   . Gastroesophageal reflux disease   . Gastroparesis   . Hepatitis C    pt denies any treatment  . Hypertension   . Polysubstance dependence including opioid type drug, episodic abuse ([place])   . Psoriasis   . Renal disorder    kidney stones    Family History  Problem Relation Age of Onset  . Hyperlipidemia  Mother   . Diabetes Mother   . Hypertension Mother   . Hypertension Sister   . Hepatitis C Sister   . Hypertension Father   . Colon cancer Neg Hx   . Liver disease Neg Hx     Past Surgical History:  Procedure Laterality Date  . APPENDECTOMY    . APPLICATION OF WOUND VAC Left 10/10/2017   Procedure: APPLICATION OF WOUND VAC;  Surgeon: Newt Minion, MD;  Location: [facility];  Service: Orthopedics;  Laterality: Left;  . ESOPHAGOGASTRODUODENOSCOPY  01/04/2008   Dr Lamar Laundry reflux esophagitis, sm HH, gastritis, BRAVO positive for GERD on BID PPI  . I&D EXTREMITY Left 10/03/2017   Procedure: IRRIGATION AND DEBRIDEMENT EXTREMITY;  Surgeon: Newt Minion, MD;  Location: [facility];  Service: Orthopedics;  Laterality: Left;  . IRRIGATION AND DEBRIDEMENT FOOT Left 10/03/2017  . LASER ABLATION CONDYLOMA CERVICAL / VULVAR    . mandible surgery for fracture    . SKIN SPLIT GRAFT Left 10/10/2017   Procedure: SKIN GRAFT SPLIT THICKNESS LEFT FOOT;  Surgeon: Newt Minion, MD;  Location: [facility];  Service: Orthopedics;  Laterality: Left;  . TUBAL LIGATION     Social History   Occupational History  . Occupation: disabled    Fish farm manager: UNEMPLOYED  Tobacco Use  . Smoking status: Current Every Day Smoker    Packs/day: 1.00    Years: 28.00    Pack years: 28.00    Types: Cigarettes  . Smokeless tobacco: Never Used  Substance and Sexual Activity  . Alcohol use: No    Comment: occ ( denies 3/ 21/19)  . Drug use: Yes    Types: Cocaine, IV, "Crack" cocaine, Marijuana, Opium    Comment: herione last week. ( denies 10/10/17)  . Sexual activity: Not Currently    Birth control/protection: None, Surgical

## 2017-11-17 ENCOUNTER — Telehealth (INDEPENDENT_AMBULATORY_CARE_PROVIDER_SITE_OTHER): Payer: Self-pay | Admitting: Orthopedic Surgery

## 2017-11-17 NOTE — Telephone Encounter (Signed)
Med refill  Percocet  [phone] mobile phone

## 2017-11-17 NOTE — Telephone Encounter (Signed)
10/10/17 [facility] to foot pt requesting refill on percocet last refill 4/22 #30 and 4/4 #25

## 2017-11-18 ENCOUNTER — Other Ambulatory Visit (INDEPENDENT_AMBULATORY_CARE_PROVIDER_SITE_OTHER): Payer: Self-pay | Admitting: Orthopedic Surgery

## 2017-11-18 MED ORDER — OXYCODONE-ACETAMINOPHEN 5-325 MG PO TABS: 1.0000 | ORAL_TABLET | Freq: Four times a day (QID) | ORAL | 0 refills | Status: DC | PRN

## 2017-11-18 NOTE — Telephone Encounter (Signed)
Called pt and advised that rx is at the front desk for pick up.

## 2017-11-18 NOTE — Telephone Encounter (Signed)
Patient called to check on status of refill.

## 2017-11-18 NOTE — Telephone Encounter (Signed)
Message has been sent to Dr. Sharol Given for review will call pt once this has been answered.

## 2017-11-18 NOTE — Telephone Encounter (Signed)
rx written

## 2017-11-19 ENCOUNTER — Telehealth (INDEPENDENT_AMBULATORY_CARE_PROVIDER_SITE_OTHER): Payer: Self-pay

## 2017-11-19 NOTE — Telephone Encounter (Signed)
Merry Proud with [facility] would like clarification on a Rx for patient.  Did not state which medication.  Cb# is [id number].  Please advise.  Thank you.

## 2017-11-19 NOTE — Telephone Encounter (Signed)
I called and [place] pharmacist and he said that the pt was locked in for medication with her pcp through her [place] medicaid but not for pain medications. He had called her insurance and did not need anything verified through [place].

## 2017-11-24 ENCOUNTER — Ambulatory Visit (INDEPENDENT_AMBULATORY_CARE_PROVIDER_SITE_OTHER): Payer: Self-pay | Admitting: Orthopedic Surgery

## 2017-11-26 ENCOUNTER — Ambulatory Visit (INDEPENDENT_AMBULATORY_CARE_PROVIDER_SITE_OTHER): Payer: Self-pay

## 2017-11-26 ENCOUNTER — Telehealth (INDEPENDENT_AMBULATORY_CARE_PROVIDER_SITE_OTHER): Payer: Self-pay | Admitting: Radiology

## 2017-11-26 ENCOUNTER — Encounter (INDEPENDENT_AMBULATORY_CARE_PROVIDER_SITE_OTHER): Payer: Self-pay | Admitting: Orthopedic Surgery

## 2017-11-26 ENCOUNTER — Ambulatory Visit (INDEPENDENT_AMBULATORY_CARE_PROVIDER_SITE_OTHER): Payer: Self-pay | Admitting: Orthopedic Surgery

## 2017-11-26 DIAGNOSIS — M25572 Pain in left ankle and joints of left foot: Secondary | ICD-10-CM

## 2017-11-26 NOTE — Telephone Encounter (Signed)
We were unable to get specimen from patient, so we sent her to [facility] to obtain specimen. The [facility] lab tech who is there was unable to successfully get blood from patient. She stated she tried 2 times and was unable to get specimen.  She stated patient admitted to her that she was on pain pills but patient could not keep her head up while they were trying to draw blood and it was very difficult to even communicate with patient. Patient reported to lab tech she had a history of IV drug abuse.

## 2017-11-26 NOTE — Telephone Encounter (Signed)
[name] called and [name] the lab tech could not get the labs drawn.  Patient was falling asleep in the chair, was not hardly able to verify her DOB or communicate with them.  She told them she was on a lot of pain meds.  They asked patient to come back tomorrow to try again to get the labs drawn. FYI

## 2017-11-27 ENCOUNTER — Ambulatory Visit (HOSPITAL_COMMUNITY): Payer: Medicaid Other

## 2017-11-27 NOTE — Telephone Encounter (Signed)
ok 

## 2017-11-30 ENCOUNTER — Encounter (INDEPENDENT_AMBULATORY_CARE_PROVIDER_SITE_OTHER): Payer: Self-pay | Admitting: Orthopedic Surgery

## 2017-11-30 NOTE — Progress Notes (Signed)
Post-Op Visit Note   Patient: [name]           Date of Birth: 10-12-1970           MRN: [id number] Visit Date: 11/26/2017 PCP: [facility]  Chief Complaint:  Chief Complaint  Patient presents with  . Left Foot - Wound Check   Visit Diagnoses:  1. Pain in left ankle and joints of left foot     Plan: [name] is a patient with severe foot pain and leg swelling.  She had a skin graft placed 10/10/2017.  She is been running a temperature of 102 at home.  Currently she is 97.1.  She is on doxycycline for her head.  She has some type of head laceration.  Patient is on doxycycline and Percocet.  On examination the patient has skin graft on the list of the dorsal aspect of the foot.  There is no fluctuance.  She does have more swelling in the left leg than the right by about 1 cm.  No soft tissue crepitus is present.  Ankle range of motion is painful.  Impression is unclear etiology of fever at this time.  Patient does not necessarily look septic.  We will draw CBC differential sed rate C-reactive protein also get ultrasound to rule out DVT.  Have her see Dr. Salome [name] within the next 1 to 2 days.  Blood pressure okay today as well.  No evidence of acute sepsis and hypotension.  Follow-Up Instructions: No follow-ups on file.   Orders:  Orders Placed This Encounter  Procedures  . XR Foot Complete Left  . CBC with Differential  . Sed Rate (ESR)  . CRP High sensitivity   No orders of the defined types were placed in this encounter.   Imaging: No results found.  PMFS History: Patient Active Problem List   Diagnosis Date Noted  . Wound, open, foot with complication, left, sequela 10/10/2017  . Cellulitis and abscess of foot 10/02/2017  . Chronic pelvic pain in female 03/23/2015  . Cancer ([place])   . Hematemesis 05/27/2013  . Urinary tract infection, site not specified 09/10/2012  . Polysubstance dependence including opioid  type drug, episodic abuse ([place]) 09/10/2012  . Gram-negative bacteremia 09/07/2012  . Acute renal failure ([place]) 09/07/2012  . Anemia 09/06/2012  . Hypokalemia 09/06/2012  . Blood in stool 09/06/2012  . Tobacco abuse 08/05/2012  . H/O intravenous drug use in remission 08/05/2012  . Hyponatremia 08/05/2012  . Psoas abscess, left ([place]) 08/05/2012  . GASTROPARESIS 09/23/2008  . NECK PAIN 11/12/2007  . HYPERLIPIDEMIA 07/10/2007  . ANXIETY 07/10/2007  . DEPRESSION 07/10/2007  . HYPERTENSION 07/10/2007  . GERD 07/10/2007  . PEPTIC ULCER DISEASE 07/10/2007  . UNSPECIFIED DISORDER OF UTERUS 07/10/2007  . PSORIASIS 07/10/2007  . ARTHRITIS 07/10/2007  . LOW BACK PAIN 07/10/2007  . WEIGHT GAIN, ABNORMAL 07/10/2007  . COUGH 07/10/2007  . CERVICAL CANCER, HX OF 07/10/2007  . MIGRAINES, HX OF 07/10/2007   Past Medical History:  Diagnosis Date  . Anxiety   . Arthritis   . Cancer ([place])   . Cervical cancer ([place])   . Depressed   . Drug addiction ([place]) Hep C  . Foot abscess, left   . Gastroesophageal reflux disease   . Gastroparesis   . Hepatitis C    pt denies any treatment  . Hypertension   . Polysubstance dependence including opioid type drug, episodic abuse ([place])   .  Psoriasis   . Renal disorder    kidney stones    Family History  Problem Relation Age of Onset  . Hyperlipidemia Mother   . Diabetes Mother   . Hypertension Mother   . Hypertension Sister   . Hepatitis C Sister   . Hypertension Father   . Colon cancer Neg Hx   . Liver disease Neg Hx     Past Surgical History:  Procedure Laterality Date  . APPENDECTOMY    . APPLICATION OF WOUND VAC Left 10/10/2017   Procedure: APPLICATION OF WOUND VAC;  Surgeon: Newt Minion, MD;  Location: [facility];  Service: Orthopedics;  Laterality: Left;  . ESOPHAGOGASTRODUODENOSCOPY  01/04/2008   Dr Lamar Laundry reflux esophagitis, sm HH, gastritis, BRAVO positive for GERD on BID PPI  . I&D EXTREMITY Left 10/03/2017   Procedure:  IRRIGATION AND DEBRIDEMENT EXTREMITY;  Surgeon: Newt Minion, MD;  Location: [facility];  Service: Orthopedics;  Laterality: Left;  . IRRIGATION AND DEBRIDEMENT FOOT Left 10/03/2017  . LASER ABLATION CONDYLOMA CERVICAL / VULVAR    . mandible surgery for fracture    . SKIN SPLIT GRAFT Left 10/10/2017   Procedure: SKIN GRAFT SPLIT THICKNESS LEFT FOOT;  Surgeon: Newt Minion, MD;  Location: [facility];  Service: Orthopedics;  Laterality: Left;  . TUBAL LIGATION     Social History   Occupational History  . Occupation: disabled    Fish farm manager: UNEMPLOYED  Tobacco Use  . Smoking status: Current Every Day Smoker    Packs/day: 1.00    Years: 28.00    Pack years: 28.00    Types: Cigarettes  . Smokeless tobacco: Never Used  Substance and Sexual Activity  . Alcohol use: No    Comment: occ ( denies 3/ 21/19)  . Drug use: Yes    Types: Cocaine, IV, "Crack" cocaine, Marijuana, Opium    Comment: herione last week. ( denies 10/10/17)  . Sexual activity: Not Currently    Birth control/protection: None, Surgical

## 2017-12-02 ENCOUNTER — Telehealth (INDEPENDENT_AMBULATORY_CARE_PROVIDER_SITE_OTHER): Payer: Self-pay | Admitting: Orthopedic Surgery

## 2017-12-02 NOTE — Telephone Encounter (Signed)
Rx refill Percocet

## 2017-12-03 NOTE — Telephone Encounter (Signed)
Called and lm on vm for pt to advise that she needs to call the office and make an appt with Dr. Sharol Given. She was to have a doppler and some lab work last week and then follow up with Dr. Sharol Given and she did not do this. We need to see her and make sure that the surgical site is ok and that there are no other complications. Pt needs to call the office so that she can be seen. No refill on percocet at this time until we can evaluate the pt. Will hold this message until I hear back from the pt.

## 2017-12-08 NOTE — Telephone Encounter (Signed)
Pt has an appt on 12/09/17 holding message until her appt.

## 2017-12-09 ENCOUNTER — Ambulatory Visit (INDEPENDENT_AMBULATORY_CARE_PROVIDER_SITE_OTHER): Payer: Self-pay | Admitting: Orthopedic Surgery

## 2017-12-09 NOTE — Telephone Encounter (Signed)
Patient said she could come tomorrow at 2:15. She thought her appt was at 4 tomorrow. She said she will be here

## 2017-12-09 NOTE — Telephone Encounter (Signed)
Pt no showed her appt again today. She needs to follow up with Dr. Sharol Given can you please call and make appt?

## 2017-12-10 ENCOUNTER — Ambulatory Visit (INDEPENDENT_AMBULATORY_CARE_PROVIDER_SITE_OTHER): Payer: Self-pay | Admitting: Orthopedic Surgery

## 2017-12-10 ENCOUNTER — Encounter (INDEPENDENT_AMBULATORY_CARE_PROVIDER_SITE_OTHER): Payer: Self-pay | Admitting: Orthopedic Surgery

## 2017-12-10 VITALS — Ht 62.0 in | Wt 173.0 lb

## 2017-12-10 DIAGNOSIS — L02612 Cutaneous abscess of left foot: Secondary | ICD-10-CM

## 2017-12-10 MED ORDER — OXYCODONE-ACETAMINOPHEN 5-325 MG PO TABS: 1.0000 | ORAL_TABLET | Freq: Three times a day (TID) | ORAL | 0 refills | Status: DC | PRN

## 2017-12-10 NOTE — Progress Notes (Signed)
Office Visit Note   Patient: [name]           Date of Birth: Sep 14, 1970           MRN: [id number] Visit Date: 12/10/2017              Requested by: [facility] [place], [place] [address] PCP: [facility]  Chief Complaint  Patient presents with  . Left Foot - Routine Post Op    10/10/17 STSG left foot s/p I&D abscess.       HPI: Patient presents in follow-up status post split-thickness skin graft left foot application of wound VAC for a large abscess and ulceration left foot.  Patient does have psoriasis she has venous stasis swelling she states that her leg has been hanging dependent and she has been up on her feet a lot.  Assessment & Plan: Visit Diagnoses: No diagnosis found.  Plan: A medical compression sock was applied she was given strict instructions to keep her foot elevated level with her heart minimize her ambulation.  She will need to change the sock if this gets soiled she will follow-up Monday for [place] to change the sock in the office she will bring the medical compression socks that she has at home for [place] to change this for her.  Follow-Up Instructions: No follow-ups on file.   Ortho Exam  Patient is alert, oriented, no adenopathy, well-dressed, normal affect, normal respiratory effort. Examination patient has excellent healthy granulation tissue of the dorsum of the left foot this is 7 cm in diameter there is no cellulitis no drainage no exposed bone or tendon no signs of infection.  Patient does have significant venous stasis swelling her calf measures 38 cm in circumference and she does have dermatitis from psoriasis but no signs of infection.  Imaging: No results found. No images are attached to the encounter.  Labs: Lab Results  Component Value Date   HGBA1C 5.6 09/15/2008   REPTSTATUS 10/08/2017 FINAL 10/03/2017   GRAMSTAIN  10/03/2017    RARE WBC PRESENT, PREDOMINANTLY PMN FEW  GRAM POSITIVE COCCI IN PAIRS    CULT  10/03/2017    MODERATE METHICILLIN RESISTANT STAPHYLOCOCCUS AUREUS NO ANAEROBES ISOLATED Performed at [facility], [place] [address], [place], [place] [address]    LABORGA METHICILLIN RESISTANT STAPHYLOCOCCUS AUREUS 10/03/2017     Lab Results  Component Value Date   ALBUMIN 3.6 10/02/2017   ALBUMIN 4.5 10/25/2016   ALBUMIN 4.3 09/30/2013    Body mass index is 31.64 kg/m.  Orders:  No orders of the defined types were placed in this encounter.  No orders of the defined types were placed in this encounter.    Procedures: No procedures performed  Clinical Data: No additional findings.  ROS:  All other systems negative, except as noted in the HPI. Review of Systems  Objective: Vital Signs: Ht 5\' 2"  (1.575 m)   Wt 173 lb (78.5 kg)   LMP 01/20/2015   BMI 31.64 kg/m   Specialty Comments:  No specialty comments available.  PMFS History: Patient Active Problem List   Diagnosis Date Noted  . Wound, open, foot with complication, left, sequela 10/10/2017  . Cellulitis and abscess of foot 10/02/2017  . Chronic pelvic pain in female 03/23/2015  . Cancer ([place])   . Hematemesis 05/27/2013  . Urinary tract infection, site not specified 09/10/2012  . Polysubstance dependence including opioid type drug, episodic abuse (  [place]) 09/10/2012  . Gram-negative bacteremia 09/07/2012  . Acute renal failure ([place]) 09/07/2012  . Anemia 09/06/2012  . Hypokalemia 09/06/2012  . Blood in stool 09/06/2012  . Tobacco abuse 08/05/2012  . H/O intravenous drug use in remission 08/05/2012  . Hyponatremia 08/05/2012  . Psoas abscess, left ([place]) 08/05/2012  . GASTROPARESIS 09/23/2008  . NECK PAIN 11/12/2007  . HYPERLIPIDEMIA 07/10/2007  . ANXIETY 07/10/2007  . DEPRESSION 07/10/2007  . HYPERTENSION 07/10/2007  . GERD 07/10/2007  . PEPTIC ULCER DISEASE 07/10/2007  . UNSPECIFIED DISORDER OF UTERUS 07/10/2007  . PSORIASIS 07/10/2007  . ARTHRITIS  07/10/2007  . LOW BACK PAIN 07/10/2007  . WEIGHT GAIN, ABNORMAL 07/10/2007  . COUGH 07/10/2007  . CERVICAL CANCER, HX OF 07/10/2007  . MIGRAINES, HX OF 07/10/2007   Past Medical History:  Diagnosis Date  . Anxiety   . Arthritis   . Cancer ([place])   . Cervical cancer ([place])   . Depressed   . Drug addiction ([place]) Hep C  . Foot abscess, left   . Gastroesophageal reflux disease   . Gastroparesis   . Hepatitis C    pt denies any treatment  . Hypertension   . Polysubstance dependence including opioid type drug, episodic abuse ([place])   . Psoriasis   . Renal disorder    kidney stones    Family History  Problem Relation Age of Onset  . Hyperlipidemia Mother   . Diabetes Mother   . Hypertension Mother   . Hypertension Sister   . Hepatitis C Sister   . Hypertension Father   . Colon cancer Neg Hx   . Liver disease Neg Hx     Past Surgical History:  Procedure Laterality Date  . APPENDECTOMY    . APPLICATION OF WOUND VAC Left 10/10/2017   Procedure: APPLICATION OF WOUND VAC;  Surgeon: Newt Minion, MD;  Location: [facility];  Service: Orthopedics;  Laterality: Left;  . ESOPHAGOGASTRODUODENOSCOPY  01/04/2008   Dr Lamar Laundry reflux esophagitis, sm HH, gastritis, BRAVO positive for GERD on BID PPI  . I&D EXTREMITY Left 10/03/2017   Procedure: IRRIGATION AND DEBRIDEMENT EXTREMITY;  Surgeon: Newt Minion, MD;  Location: [facility];  Service: Orthopedics;  Laterality: Left;  . IRRIGATION AND DEBRIDEMENT FOOT Left 10/03/2017  . LASER ABLATION CONDYLOMA CERVICAL / VULVAR    . mandible surgery for fracture    . SKIN SPLIT GRAFT Left 10/10/2017   Procedure: SKIN GRAFT SPLIT THICKNESS LEFT FOOT;  Surgeon: Newt Minion, MD;  Location: [facility];  Service: Orthopedics;  Laterality: Left;  . TUBAL LIGATION     Social History   Occupational History  . Occupation: disabled    Fish farm manager: UNEMPLOYED  Tobacco Use  . Smoking status: Current Every Day Smoker    Packs/day: 1.00    Years: 28.00    Pack  years: 28.00    Types: Cigarettes  . Smokeless tobacco: Never Used  Substance and Sexual Activity  . Alcohol use: No    Comment: occ ( denies 3/ 21/19)  . Drug use: Yes    Types: Cocaine, IV, "Crack" cocaine, Marijuana, Opium    Comment: herione last week. ( denies 10/10/17)  . Sexual activity: Not Currently    Birth control/protection: None, Surgical

## 2017-12-11 ENCOUNTER — Ambulatory Visit (INDEPENDENT_AMBULATORY_CARE_PROVIDER_SITE_OTHER): Payer: Self-pay | Admitting: Orthopedic Surgery

## 2017-12-17 ENCOUNTER — Ambulatory Visit (INDEPENDENT_AMBULATORY_CARE_PROVIDER_SITE_OTHER): Payer: Self-pay | Admitting: Family

## 2017-12-29 ENCOUNTER — Encounter (INDEPENDENT_AMBULATORY_CARE_PROVIDER_SITE_OTHER): Payer: Self-pay | Admitting: Orthopedic Surgery

## 2017-12-29 ENCOUNTER — Ambulatory Visit (INDEPENDENT_AMBULATORY_CARE_PROVIDER_SITE_OTHER): Payer: Self-pay | Admitting: Orthopedic Surgery

## 2017-12-29 VITALS — Ht 62.0 in | Wt 173.0 lb

## 2017-12-29 DIAGNOSIS — L02612 Cutaneous abscess of left foot: Secondary | ICD-10-CM

## 2017-12-29 MED ORDER — OXYCODONE-ACETAMINOPHEN 5-325 MG PO TABS: 1.0000 | ORAL_TABLET | Freq: Three times a day (TID) | ORAL | 0 refills | Status: DC | PRN

## 2017-12-29 NOTE — Progress Notes (Signed)
Office Visit Note   Patient: [name]           Date of Birth: [birth year]           MRN: [id number] Visit Date: 12/29/2017              Requested by: [facility] [place], [place] [address] PCP: [facility]  Chief Complaint  Patient presents with  . Left Foot - Routine Post Op    10/10/17 [facility] s/p I&D      HPI: Patient is a 47 year old woman who presents for follow-up status post abscess ulceration left foot.  Patient is not wearing her medical compression stockings she is wearing a regular cotton sock.  Assessment & Plan: Visit Diagnoses:  1. Cutaneous abscess of left foot     Plan: Discussed the importance of wearing the medical compression stocking wash her foot with soap and water daily.  Follow-up in 2 weeks.  Discussed the importance of elevation and also discussed protein powder supplement.  Follow-Up Instructions: No follow-ups on file.   Ortho Exam  Patient is alert, oriented, no adenopathy, well-dressed, normal affect, normal respiratory effort. Examination patient has increased swelling in her left foot.  She has not been elevating her foot she has not been wearing her compression stockings.  The wound has fibrinous exudate over the dorsum of the wound.  There is no cellulitis no drainage no odor no signs of infection.  Imaging: No results found. No images are attached to the encounter.  Labs: Lab Results  Component Value Date   HGBA1C 5.6 09/15/2008   REPTSTATUS 10/08/2017 FINAL 10/03/2017   GRAMSTAIN  10/03/2017    RARE WBC PRESENT, PREDOMINANTLY PMN FEW GRAM POSITIVE COCCI IN PAIRS    CULT  10/03/2017    MODERATE METHICILLIN RESISTANT STAPHYLOCOCCUS AUREUS NO ANAEROBES ISOLATED Performed at [facility], [place] [address], [place], [place] [address]    LABORGA METHICILLIN RESISTANT STAPHYLOCOCCUS AUREUS 10/03/2017     Lab Results  Component Value Date   ALBUMIN 3.6  10/02/2017   ALBUMIN 4.5 10/25/2016   ALBUMIN 4.3 09/30/2013    Body mass index is 31.64 kg/m.  Orders:  No orders of the defined types were placed in this encounter.  Meds ordered this encounter  Medications  . oxyCODONE-acetaminophen (PERCOCET/ROXICET) 5-325 MG tablet    Sig: Take 1 tablet by mouth every 8 (eight) hours as needed.    Dispense:  20 tablet    Refill:  0     Procedures: No procedures performed  Clinical Data: No additional findings.  ROS:  All other systems negative, except as noted in the HPI. Review of Systems  Objective: Vital Signs: Ht 5\' 2"  (1.575 m)   Wt 173 lb (78.5 kg)   LMP 01/20/2015   BMI 31.64 kg/m   Specialty Comments:  No specialty comments available.  PMFS History: Patient Active Problem List   Diagnosis Date Noted  . Wound, open, foot with complication, left, sequela 10/10/2017  . Cellulitis and abscess of foot 10/02/2017  . Chronic pelvic pain in female 03/23/2015  . Cancer ([place])   . Hematemesis 05/27/2013  . Urinary tract infection, site not specified 09/10/2012  . Polysubstance dependence including opioid type drug, episodic abuse ([place]) 09/10/2012  . Gram-negative bacteremia 09/07/2012  . Acute renal failure ([place]) 09/07/2012  . Anemia 09/06/2012  . Hypokalemia 09/06/2012  . Blood in stool 09/06/2012  . Tobacco abuse  08/05/2012  . H/O intravenous drug use in remission 08/05/2012  . Hyponatremia 08/05/2012  . Psoas abscess, left ([place]) 08/05/2012  . GASTROPARESIS 09/23/2008  . NECK PAIN 11/12/2007  . HYPERLIPIDEMIA 07/10/2007  . ANXIETY 07/10/2007  . DEPRESSION 07/10/2007  . HYPERTENSION 07/10/2007  . GERD 07/10/2007  . PEPTIC ULCER DISEASE 07/10/2007  . UNSPECIFIED DISORDER OF UTERUS 07/10/2007  . PSORIASIS 07/10/2007  . ARTHRITIS 07/10/2007  . LOW BACK PAIN 07/10/2007  . WEIGHT GAIN, ABNORMAL 07/10/2007  . COUGH 07/10/2007  . CERVICAL CANCER, HX OF 07/10/2007  . MIGRAINES, HX OF 07/10/2007   Past Medical  History:  Diagnosis Date  . Anxiety   . Arthritis   . Cancer ([place])   . Cervical cancer ([place])   . Depressed   . Drug addiction ([place]) Hep C  . Foot abscess, left   . Gastroesophageal reflux disease   . Gastroparesis   . Hepatitis C    pt denies any treatment  . Hypertension   . Polysubstance dependence including opioid type drug, episodic abuse ([place])   . Psoriasis   . Renal disorder    kidney stones    Family History  Problem Relation Age of Onset  . Hyperlipidemia Mother   . Diabetes Mother   . Hypertension Mother   . Hypertension Sister   . Hepatitis C Sister   . Hypertension Father   . Colon cancer Neg Hx   . Liver disease Neg Hx     Past Surgical History:  Procedure Laterality Date  . APPENDECTOMY    . APPLICATION OF WOUND VAC Left 10/10/2017   Procedure: APPLICATION OF WOUND VAC;  Surgeon: Newt Minion, MD;  Location: [facility];  Service: Orthopedics;  Laterality: Left;  . ESOPHAGOGASTRODUODENOSCOPY  01/04/2008   Dr Lamar Laundry reflux esophagitis, sm HH, gastritis, BRAVO positive for GERD on BID PPI  . I&D EXTREMITY Left 10/03/2017   Procedure: IRRIGATION AND DEBRIDEMENT EXTREMITY;  Surgeon: Newt Minion, MD;  Location: [facility];  Service: Orthopedics;  Laterality: Left;  . IRRIGATION AND DEBRIDEMENT FOOT Left 10/03/2017  . LASER ABLATION CONDYLOMA CERVICAL / VULVAR    . mandible surgery for fracture    . SKIN SPLIT GRAFT Left 10/10/2017   Procedure: SKIN GRAFT SPLIT THICKNESS LEFT FOOT;  Surgeon: Newt Minion, MD;  Location: [facility];  Service: Orthopedics;  Laterality: Left;  . TUBAL LIGATION     Social History   Occupational History  . Occupation: disabled    Fish farm manager: UNEMPLOYED  Tobacco Use  . Smoking status: Current Every Day Smoker    Packs/day: 1.00    Years: 28.00    Pack years: 28.00    Types: Cigarettes  . Smokeless tobacco: Never Used  Substance and Sexual Activity  . Alcohol use: No    Comment: occ ( denies 3/ 21/19)  . Drug use: Yes    Types:  Cocaine, IV, "Crack" cocaine, Marijuana, Opium    Comment: herione last week. ( denies 10/10/17)  . Sexual activity: Not Currently    Birth control/protection: None, Surgical

## 2018-01-15 ENCOUNTER — Ambulatory Visit (INDEPENDENT_AMBULATORY_CARE_PROVIDER_SITE_OTHER): Payer: Self-pay | Admitting: Orthopedic Surgery

## 2018-04-03 IMAGING — DX DG ANKLE COMPLETE 3+V*L*
3 series · 3 of 3 positions shown · non-contrast
Comparison: None.

CLINICAL DATA: Abscess on top of foot.

EXAM:
LEFT ANKLE COMPLETE - 3+ VIEW

[ankle ap]
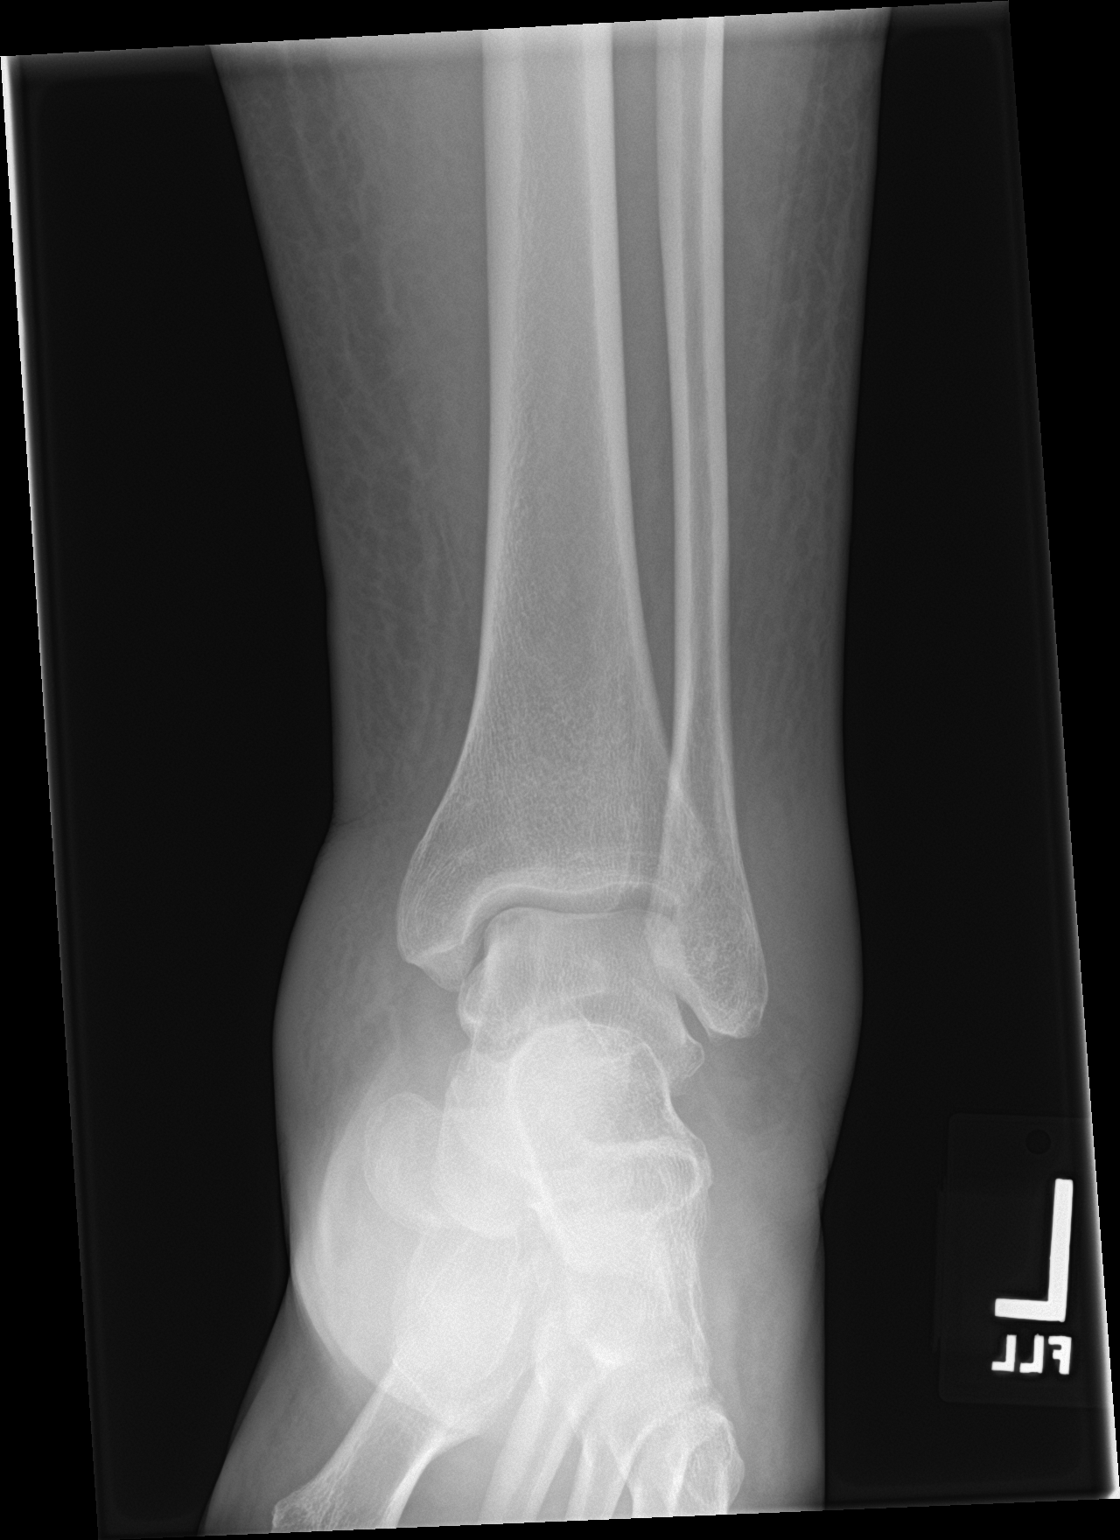

[ankle obl]
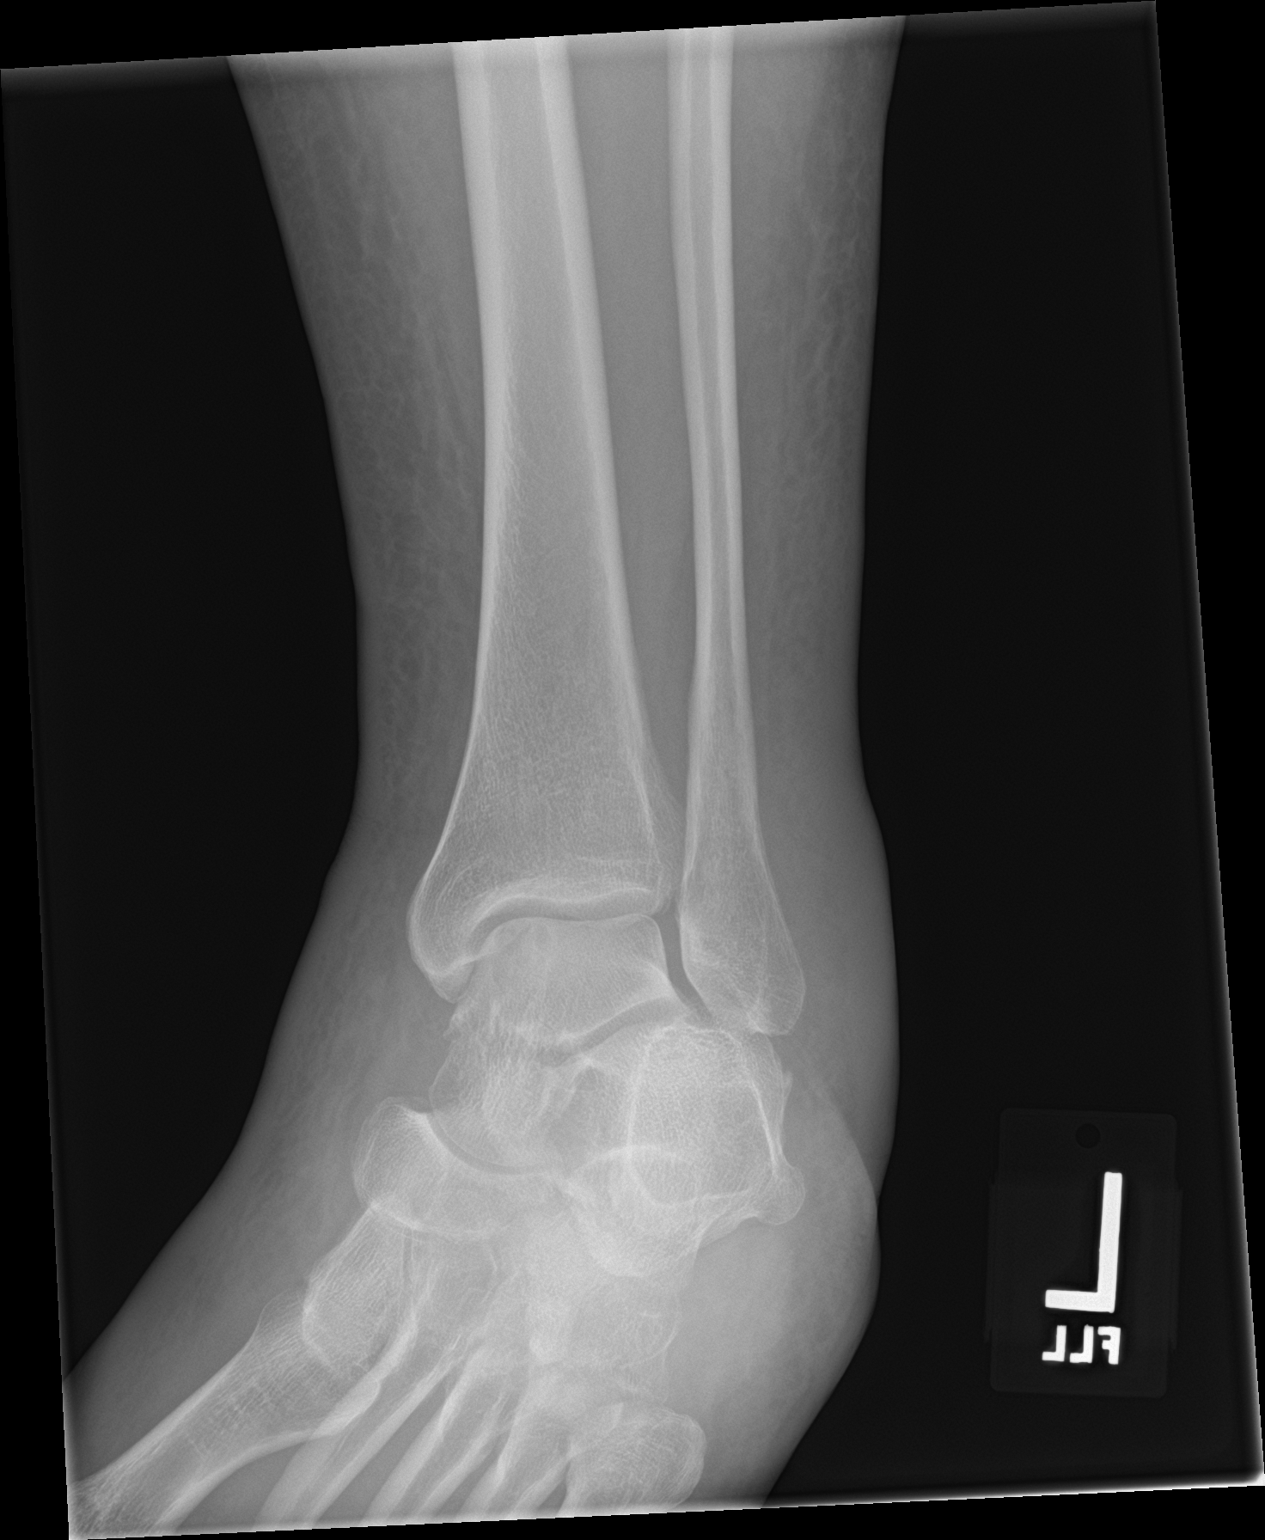

[ankle lat]
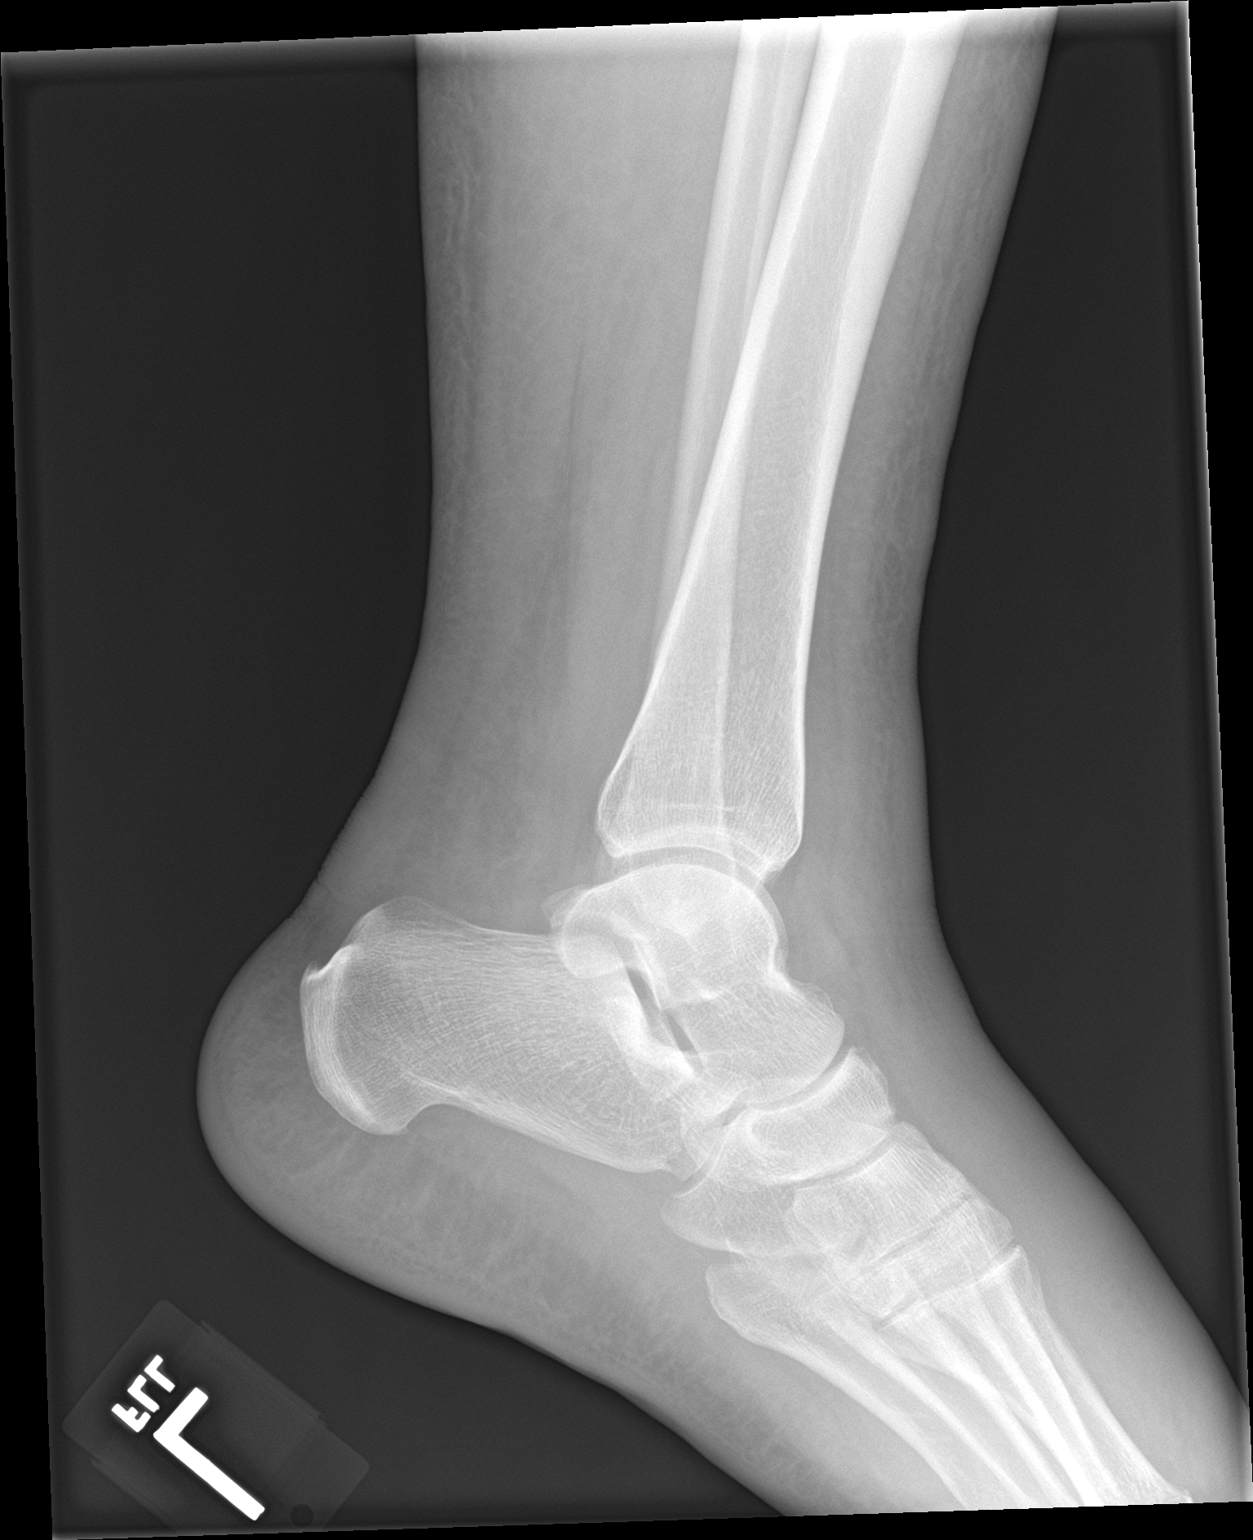

[3 of 3 positions shown; findings below may reference images not displayed]

FINDINGS: Three-view exam left ankle shows no acute fracture. No subluxation
or dislocation. No worrisome lytic or sclerotic osseous abnormality.
Diffuse soft tissue swelling is evident without radiopaque soft
tissue foreign body.
IMPRESSION: Diffuse soft tissue swelling without underlying bony abnormality.

## 2018-04-03 IMAGING — MR MR FOOT*L* W/O CM
4 of 5 series · 19 of 40 positions shown · non-contrast
Comparison: Ankle radiographs 10/02/2017

CLINICAL DATA: Patient was shooting up cocaine 4 days ago and her
left foot. Foot abscess.

EXAM:
MRI OF THE LEFT FOOT WITHOUT CONTRAST
TECHNIQUE: Multiplanar, multisequence MR imaging of the was performed. No
intravenous contrast was administered.

[Series 3: T1 · coronal · 4.0mm · 0.32mm/px · 9 of 51 slices shown (1 of 2)]
[im 1/51]
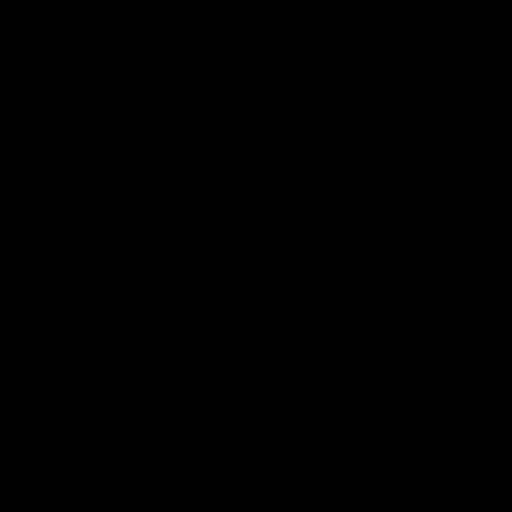
[im 9/51]
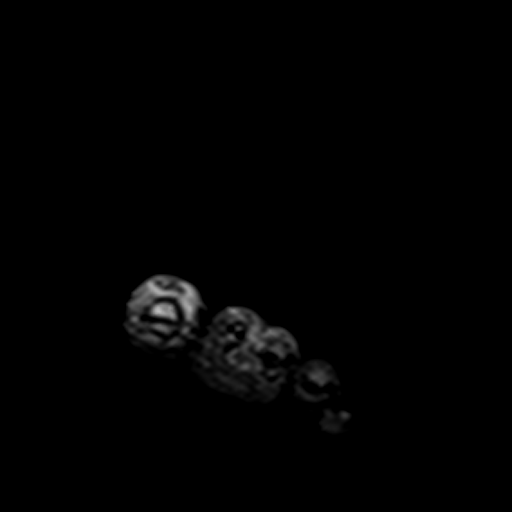
[im 17/51]
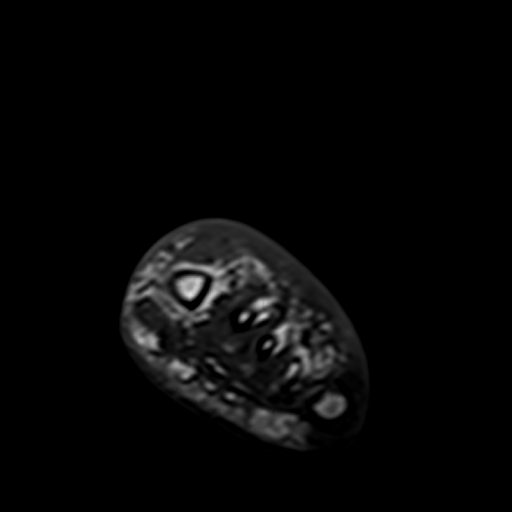
[im 21/51]
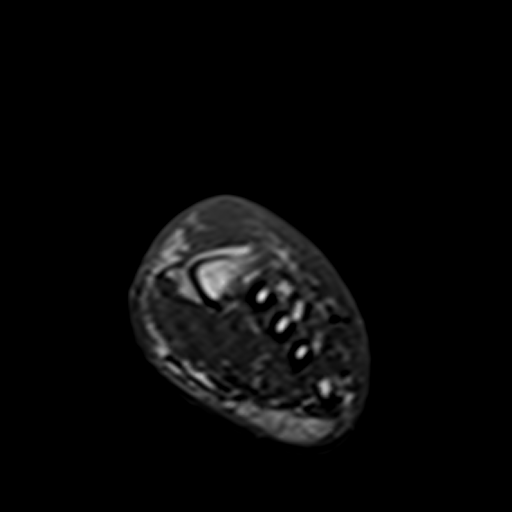
[im 26/51]
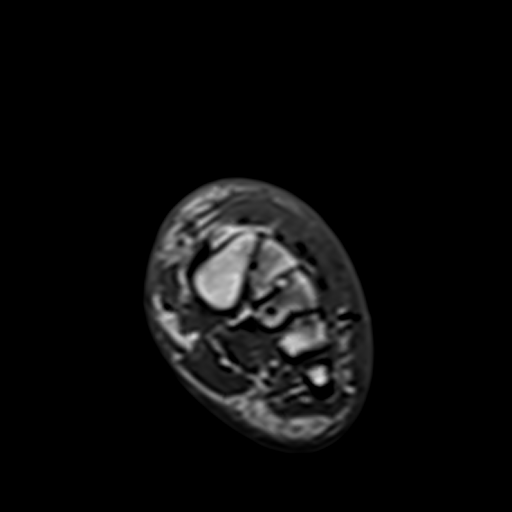
[im 30/51]
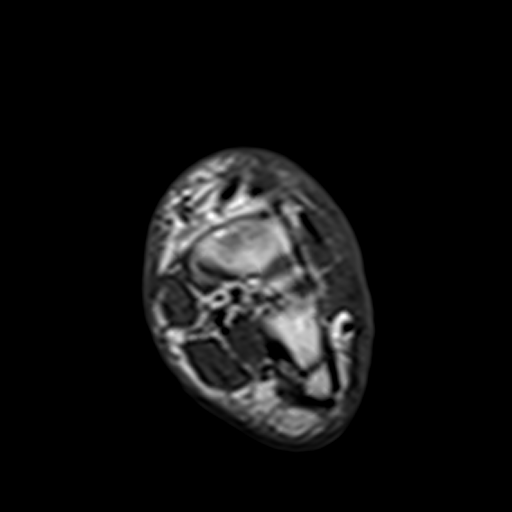
[im 34/51]
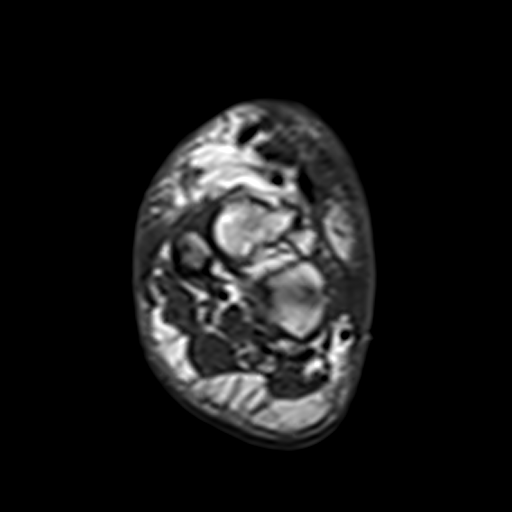
[im 42/51]
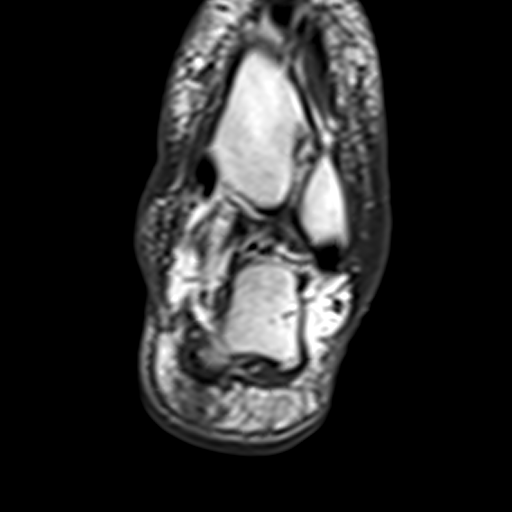
[im 51/51]
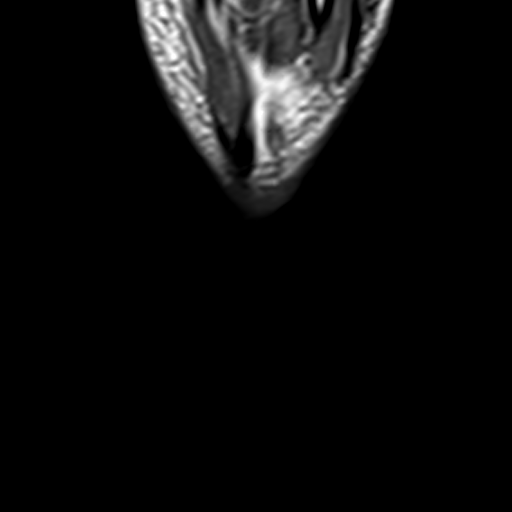

[Series 4: t2fs axial · coronal · 4.0mm · 0.33mm/px · 3 of 50 slices shown]
[im 9/50]
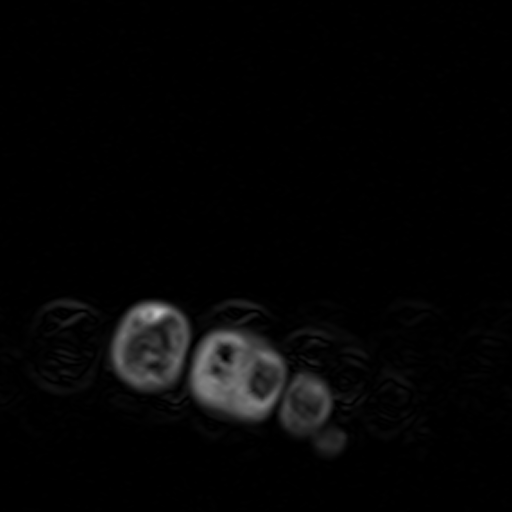
[im 27/50]
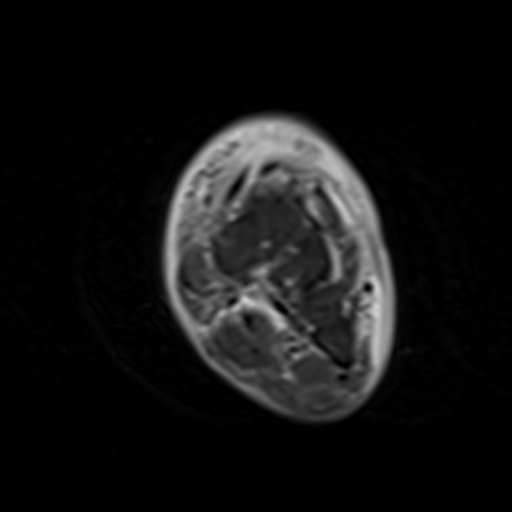
[im 45/50]
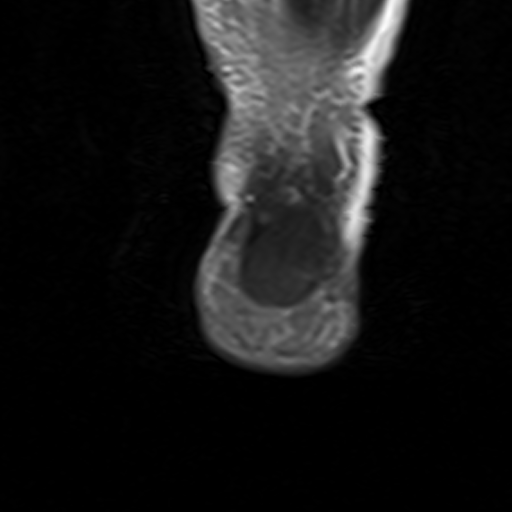

[Series 5: ir sagital · sagittal · 4.0mm · 0.50mm/px · 3 of 22 slices shown]
[im 1/22]
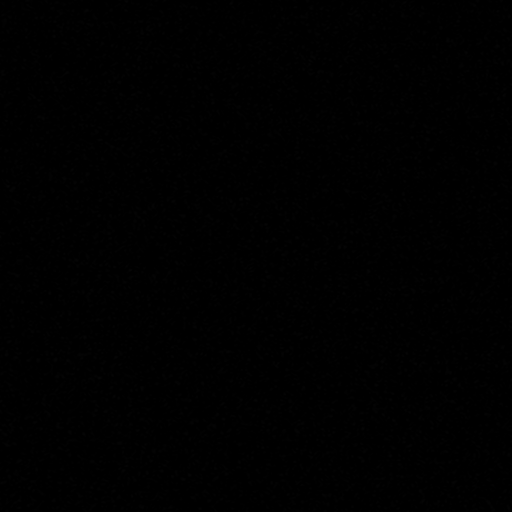
[im 11/22]
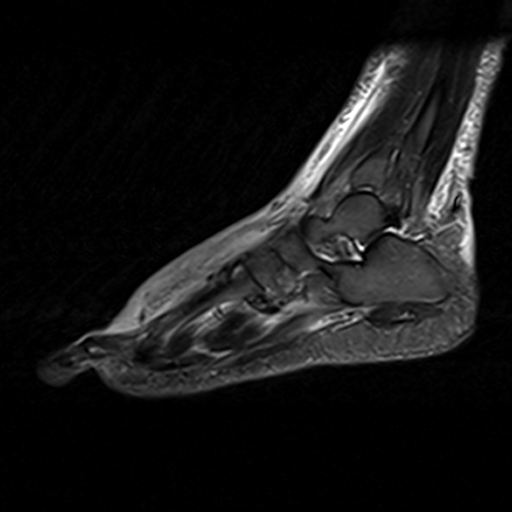
[im 22/22]
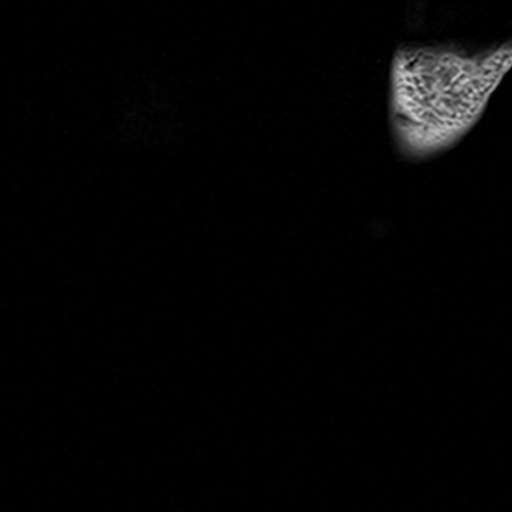

[Series 6: T1 · oblique · 4.0mm · 0.53mm/px · 4 of 20 slices shown (2 of 2)]
[im 1/20]
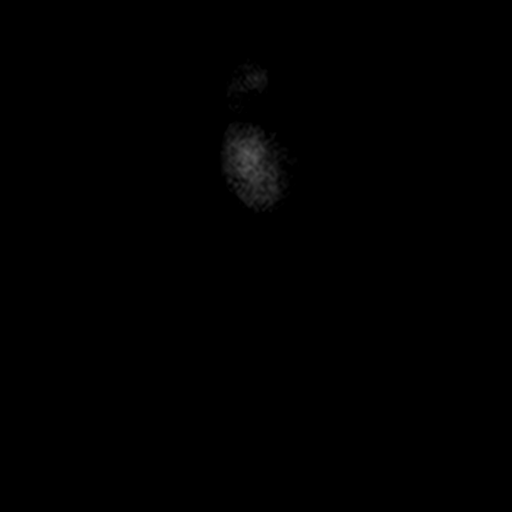
[im 5/20]
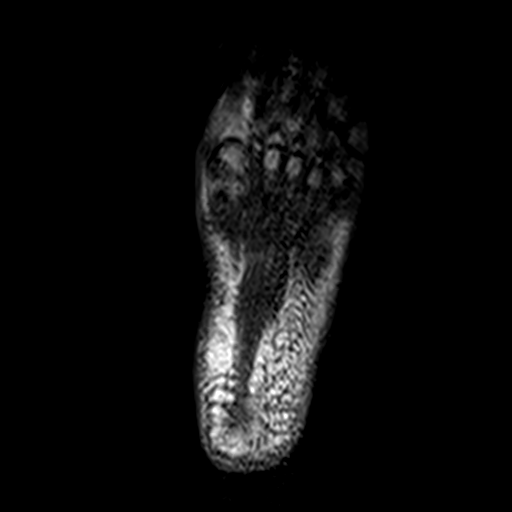
[im 10/20]
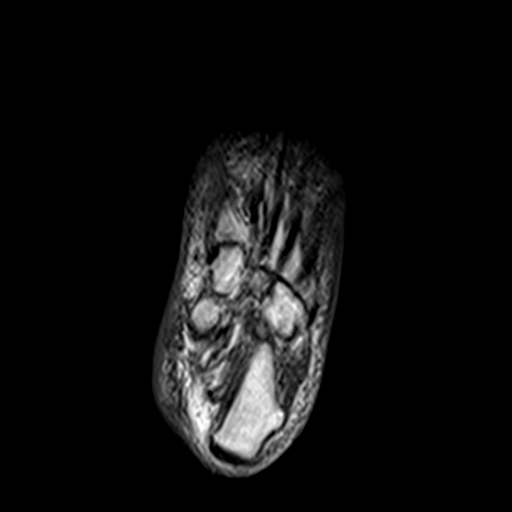
[im 20/20]
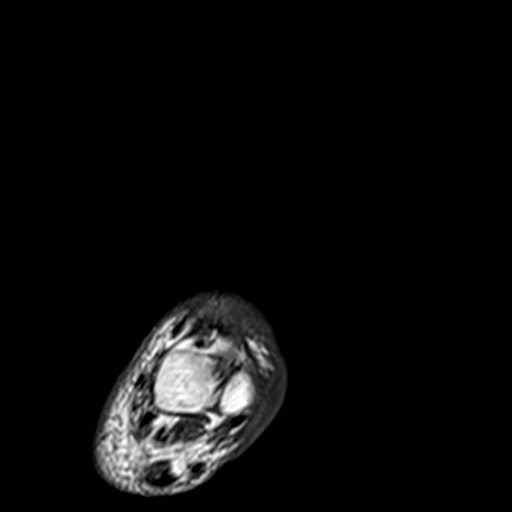

[19 of 40 positions shown; findings below may reference images not displayed]

FINDINGS: Limited study as the patient

Bones/Joint/Cartilage

No evidence of acute fracture, joint dislocation nor joint effusion.
The distal tibia and fibular intact. Subchondral cystic change
likely degenerative in etiology deep to the medial talar dome is
noted. No marrow signal abnormality to indicate acute osteomyelitis.

Ligaments

Intact

Muscles and Tendons

Tenosynovitis of the extensor hallucis longus tendon as well as
extensor tendons to the third and fourth rays. No tendon rupture is
identified. Given patient's history of IV drug abuse, infectious
tenosynovitis is a possibility. No evidence of pyomyositis or
myositis.

Soft tissues

18 x 11 x 9 mm dorsal soft tissue fluid collection compatible with a
subcutaneous abscess overlying the dorsum of the proximal first
metatarsal. Subcutaneous edema about the included ankle and dorsum
of the foot consistent with cellulitis.
IMPRESSION: 1. No marrow signal abnormality suspicious for acute osteomyelitis.
Degenerative subchondral cystic change of the medial talar dome is
noted at the ankle.
2. Fluid outlining extensor tendons to the toes most prominently
involving the extensor hallucis longus and to a lesser degree the
extensor tendons to the third and fourth rays. Given history of
intravenous injections 2 foot, the possibility of infected
tenosynovitis is raised. Over the dorsum of the first metatarsal is
a subcutaneous fluid collection measuring 18 x 11 x 9 mm that may
represent a small subcutaneous abscess or iatrogenically introduced
fluid.

## 2022-11-06 ENCOUNTER — Encounter (HOSPITAL_COMMUNITY): Payer: Self-pay | Admitting: Emergency Medicine

## 2022-11-06 ENCOUNTER — Emergency Department (HOSPITAL_COMMUNITY): Payer: Medicaid Other

## 2022-11-06 ENCOUNTER — Other Ambulatory Visit: Payer: Self-pay

## 2022-11-06 ENCOUNTER — Inpatient Hospital Stay (HOSPITAL_COMMUNITY)
Admission: EM | Admit: 2022-11-06 | Discharge: 2022-11-11 | DRG: 193 | Disposition: A | Discharge status: Home / Self Care | Payer: Medicaid Other | Attending: Internal Medicine | Admitting: Internal Medicine

## 2022-11-06 DIAGNOSIS — J9601 Acute respiratory failure with hypoxia: Principal | ICD-10-CM | POA: Diagnosis present

## 2022-11-06 DIAGNOSIS — T380X5A Adverse effect of glucocorticoids and synthetic analogues, initial encounter: Secondary | ICD-10-CM | POA: Diagnosis not present

## 2022-11-06 DIAGNOSIS — B1920 Unspecified viral hepatitis C without hepatic coma: Secondary | ICD-10-CM | POA: Diagnosis present

## 2022-11-06 DIAGNOSIS — Z7982 Long term (current) use of aspirin: Secondary | ICD-10-CM | POA: Diagnosis not present

## 2022-11-06 DIAGNOSIS — Z87442 Personal history of urinary calculi: Secondary | ICD-10-CM

## 2022-11-06 DIAGNOSIS — E876 Hypokalemia: Secondary | ICD-10-CM | POA: Diagnosis not present

## 2022-11-06 DIAGNOSIS — Z833 Family history of diabetes mellitus: Secondary | ICD-10-CM | POA: Diagnosis not present

## 2022-11-06 DIAGNOSIS — I340 Nonrheumatic mitral (valve) insufficiency: Secondary | ICD-10-CM | POA: Diagnosis present

## 2022-11-06 DIAGNOSIS — J209 Acute bronchitis, unspecified: Secondary | ICD-10-CM | POA: Diagnosis present

## 2022-11-06 DIAGNOSIS — Z83438 Family history of other disorder of lipoprotein metabolism and other lipidemia: Secondary | ICD-10-CM

## 2022-11-06 DIAGNOSIS — D649 Anemia, unspecified: Secondary | ICD-10-CM

## 2022-11-06 DIAGNOSIS — F1420 Cocaine dependence, uncomplicated: Secondary | ICD-10-CM | POA: Diagnosis present

## 2022-11-06 DIAGNOSIS — Z885 Allergy status to narcotic agent status: Secondary | ICD-10-CM | POA: Diagnosis not present

## 2022-11-06 DIAGNOSIS — D509 Iron deficiency anemia, unspecified: Secondary | ICD-10-CM | POA: Diagnosis present

## 2022-11-06 DIAGNOSIS — F418 Other specified anxiety disorders: Secondary | ICD-10-CM | POA: Diagnosis not present

## 2022-11-06 DIAGNOSIS — J189 Pneumonia, unspecified organism: Principal | ICD-10-CM | POA: Insufficient documentation

## 2022-11-06 DIAGNOSIS — F32A Depression, unspecified: Secondary | ICD-10-CM | POA: Diagnosis present

## 2022-11-06 DIAGNOSIS — Z8541 Personal history of malignant neoplasm of cervix uteri: Secondary | ICD-10-CM

## 2022-11-06 DIAGNOSIS — E669 Obesity, unspecified: Secondary | ICD-10-CM

## 2022-11-06 DIAGNOSIS — F1220 Cannabis dependence, uncomplicated: Secondary | ICD-10-CM | POA: Diagnosis present

## 2022-11-06 DIAGNOSIS — R7989 Other specified abnormal findings of blood chemistry: Secondary | ICD-10-CM

## 2022-11-06 DIAGNOSIS — F1920 Other psychoactive substance dependence, uncomplicated: Secondary | ICD-10-CM | POA: Diagnosis not present

## 2022-11-06 DIAGNOSIS — Z888 Allergy status to other drugs, medicaments and biological substances status: Secondary | ICD-10-CM

## 2022-11-06 DIAGNOSIS — L409 Psoriasis, unspecified: Secondary | ICD-10-CM | POA: Diagnosis present

## 2022-11-06 DIAGNOSIS — I509 Heart failure, unspecified: Secondary | ICD-10-CM | POA: Diagnosis not present

## 2022-11-06 DIAGNOSIS — R739 Hyperglycemia, unspecified: Secondary | ICD-10-CM | POA: Diagnosis not present

## 2022-11-06 DIAGNOSIS — F17210 Nicotine dependence, cigarettes, uncomplicated: Secondary | ICD-10-CM | POA: Diagnosis present

## 2022-11-06 DIAGNOSIS — Z8249 Family history of ischemic heart disease and other diseases of the circulatory system: Secondary | ICD-10-CM

## 2022-11-06 DIAGNOSIS — Z1152 Encounter for screening for COVID-19: Secondary | ICD-10-CM

## 2022-11-06 DIAGNOSIS — M1990 Unspecified osteoarthritis, unspecified site: Secondary | ICD-10-CM | POA: Diagnosis present

## 2022-11-06 DIAGNOSIS — I10 Essential (primary) hypertension: Secondary | ICD-10-CM | POA: Diagnosis not present

## 2022-11-06 DIAGNOSIS — I5031 Acute diastolic (congestive) heart failure: Secondary | ICD-10-CM | POA: Diagnosis present

## 2022-11-06 DIAGNOSIS — I110 Hypertensive heart disease with heart failure: Secondary | ICD-10-CM | POA: Diagnosis present

## 2022-11-06 DIAGNOSIS — Z6841 Body Mass Index (BMI) 40.0 and over, adult: Secondary | ICD-10-CM | POA: Diagnosis not present

## 2022-11-06 DIAGNOSIS — K219 Gastro-esophageal reflux disease without esophagitis: Secondary | ICD-10-CM | POA: Diagnosis present

## 2022-11-06 DIAGNOSIS — Z79899 Other long term (current) drug therapy: Secondary | ICD-10-CM | POA: Diagnosis not present

## 2022-11-06 DIAGNOSIS — Z720 Tobacco use: Secondary | ICD-10-CM | POA: Diagnosis present

## 2022-11-06 DIAGNOSIS — F419 Anxiety disorder, unspecified: Secondary | ICD-10-CM | POA: Diagnosis present

## 2022-11-06 DIAGNOSIS — F1120 Opioid dependence, uncomplicated: Secondary | ICD-10-CM | POA: Diagnosis present

## 2022-11-06 LAB — BASIC METABOLIC PANEL
Anion gap: 8 (ref 5–15)
BUN: 8 mg/dL (ref 6–20)
CO2: 27 mmol/L (ref 22–32)
Calcium: 8.5 mg/dL — ABNORMAL LOW (ref 8.9–10.3)
Chloride: 104 mmol/L (ref 98–111)
Creatinine, Ser: 0.93 mg/dL (ref 0.44–1.00)
GFR, Estimated: >60 mL/min (ref >60)
Glucose, Bld: 154 mg/dL — ABNORMAL HIGH (ref 70–99)
Potassium: 4.4 mmol/L (ref 3.5–5.1)
Sodium: 139 mmol/L (ref 135–145)

## 2022-11-06 LAB — CBC WITH DIFFERENTIAL/PLATELET
Abs Immature Granulocytes: 0.07 10*3/uL (ref 0.00–0.07)
Basophils Absolute: 0.1 10*3/uL (ref 0.0–0.1)
Basophils Relative: 1 %
Eosinophils Absolute: 0.2 10*3/uL (ref 0.0–0.5)
Eosinophils Relative: 2 %
HCT: 27.1 % — ABNORMAL LOW (ref 36.0–46.0)
Hemoglobin: 7.8 g/dL — ABNORMAL LOW (ref 12.0–15.0)
Immature Granulocytes: 1 %
Lymphocytes Relative: 28 %
Lymphs Abs: 2.5 10*3/uL (ref 0.7–4.0)
MCH: 27 pg (ref 26.0–34.0)
MCHC: 28.8 g/dL — ABNORMAL LOW (ref 30.0–36.0)
MCV: 93.8 fL (ref 80.0–100.0)
Monocytes Absolute: 0.5 10*3/uL (ref 0.1–1.0)
Monocytes Relative: 6 %
Neutro Abs: 5.6 10*3/uL (ref 1.7–7.7)
Neutrophils Relative %: 62 %
Platelets: 342 10*3/uL (ref 150–400)
RBC: 2.89 MIL/uL — ABNORMAL LOW (ref 3.87–5.11)
RDW: 17.9 % — ABNORMAL HIGH (ref 11.5–15.5)
WBC: 8.9 10*3/uL (ref 4.0–10.5)
nRBC: 0.9 % — ABNORMAL HIGH (ref 0.0–0.2)

## 2022-11-06 LAB — CULTURE, BLOOD (ROUTINE X 2)

## 2022-11-06 LAB — SARS CORONAVIRUS 2 BY RT PCR: SARS Coronavirus 2 by RT PCR: NEGATIVE

## 2022-11-06 LAB — TROPONIN I (HIGH SENSITIVITY)
Troponin I (High Sensitivity): 4 ng/L (ref <18)
Troponin I (High Sensitivity): 3 ng/L (ref <18)

## 2022-11-06 LAB — BRAIN NATRIURETIC PEPTIDE: B Natriuretic Peptide: 921 pg/mL — ABNORMAL HIGH (ref 0.0–100.0)

## 2022-11-06 MED ORDER — ONDANSETRON HCL 4 MG PO TABS: 4.0000 mg | ORAL_TABLET | Freq: Four times a day (QID) | ORAL | Status: DC | PRN

## 2022-11-06 MED ORDER — METHYLPREDNISOLONE SODIUM SUCC 40 MG IJ SOLR
40.0000 mg | Freq: Two times a day (BID) | INTRAMUSCULAR | Status: DC
  Administered 2022-11-07 – 2022-11-09 (×4): 40 mg via INTRAVENOUS
  Filled 2022-11-06 (×5): qty 1

## 2022-11-06 MED ORDER — SODIUM CHLORIDE 0.9 % IV SOLN
500.0000 mg | INTRAVENOUS | Status: AC
  Administered 2022-11-07 – 2022-11-10 (×4): 500 mg via INTRAVENOUS
  Filled 2022-11-06 (×4): qty 5

## 2022-11-06 MED ORDER — ACETAMINOPHEN 650 MG RE SUPP: 650.0000 mg | Freq: Four times a day (QID) | RECTAL | Status: DC | PRN

## 2022-11-06 MED ORDER — BUPRENORPHINE HCL-NALOXONE HCL 8-2 MG SL SUBL
1.0000 | SUBLINGUAL_TABLET | Freq: Every day | SUBLINGUAL | Status: DC
  Administered 2022-11-06 – 2022-11-07 (×2): 1 via SUBLINGUAL
  Filled 2022-11-06 (×2): qty 1

## 2022-11-06 MED ORDER — ALBUTEROL (5 MG/ML) CONTINUOUS INHALATION SOLN
10.0000 mg | INHALATION_SOLUTION | RESPIRATORY_TRACT | Status: AC
  Administered 2022-11-06: 10 mg via RESPIRATORY_TRACT
  Filled 2022-11-06: qty 5

## 2022-11-06 MED ORDER — SODIUM CHLORIDE 0.9 % IV SOLN
2.0000 g | INTRAVENOUS | Status: AC
  Administered 2022-11-08 – 2022-11-10 (×3): 2 g via INTRAVENOUS
  Filled 2022-11-06 (×3): qty 20

## 2022-11-06 MED ORDER — CITALOPRAM HYDROBROMIDE 20 MG PO TABS
40.0000 mg | ORAL_TABLET | Freq: Every day | ORAL | Status: DC
  Administered 2022-11-06 – 2022-11-11 (×6): 40 mg via ORAL
  Filled 2022-11-06 (×6): qty 2

## 2022-11-06 MED ORDER — PANTOPRAZOLE SODIUM 40 MG PO TBEC
40.0000 mg | DELAYED_RELEASE_TABLET | Freq: Every day | ORAL | Status: DC
  Administered 2022-11-06 – 2022-11-11 (×6): 40 mg via ORAL
  Filled 2022-11-06 (×6): qty 1

## 2022-11-06 MED ORDER — MAGNESIUM SULFATE 2 GM/50ML IV SOLN
2.0000 g | INTRAVENOUS | Status: AC
  Administered 2022-11-07: 2 g via INTRAVENOUS
  Filled 2022-11-06: qty 50

## 2022-11-06 MED ORDER — FUROSEMIDE 10 MG/ML IJ SOLN
20.0000 mg | Freq: Two times a day (BID) | INTRAMUSCULAR | Status: DC
  Administered 2022-11-06: 20 mg via INTRAVENOUS
  Filled 2022-11-06 (×2): qty 2

## 2022-11-06 MED ORDER — SODIUM CHLORIDE 0.9 % IV SOLN
500.0000 mg | Freq: Once | INTRAVENOUS | Status: AC
  Administered 2022-11-06: 500 mg via INTRAVENOUS
  Filled 2022-11-06: qty 5

## 2022-11-06 MED ORDER — ACETAMINOPHEN 325 MG PO TABS
650.0000 mg | ORAL_TABLET | Freq: Four times a day (QID) | ORAL | Status: DC | PRN
  Administered 2022-11-10: 650 mg via ORAL
  Filled 2022-11-06: qty 2

## 2022-11-06 MED ORDER — CITALOPRAM HYDROBROMIDE 20 MG PO TABS: 40.0000 mg | ORAL_TABLET | Freq: Every day | ORAL | Status: DC

## 2022-11-06 MED ORDER — IPRATROPIUM BROMIDE 0.02 % IN SOLN
0.5000 mg | Freq: Once | RESPIRATORY_TRACT | Status: AC
  Administered 2022-11-06: 0.5 mg via RESPIRATORY_TRACT
  Filled 2022-11-06: qty 2.5

## 2022-11-06 MED ORDER — IPRATROPIUM-ALBUTEROL 0.5-2.5 (3) MG/3ML IN SOLN
3.0000 mL | Freq: Four times a day (QID) | RESPIRATORY_TRACT | Status: DC
  Administered 2022-11-07 – 2022-11-08 (×8): 3 mL via RESPIRATORY_TRACT
  Filled 2022-11-06 (×8): qty 3

## 2022-11-06 MED ORDER — SODIUM CHLORIDE 0.9 % IV SOLN
1.0000 g | Freq: Once | INTRAVENOUS | Status: AC
  Administered 2022-11-06: 1 g via INTRAVENOUS
  Filled 2022-11-06: qty 10

## 2022-11-06 MED ORDER — GUAIFENESIN-DM 100-10 MG/5ML PO SYRP: 5.0000 mL | ORAL_SOLUTION | ORAL | Status: DC | PRN

## 2022-11-06 MED ORDER — ONDANSETRON HCL 4 MG/2ML IJ SOLN: 4.0000 mg | Freq: Four times a day (QID) | INTRAMUSCULAR | Status: DC | PRN

## 2022-11-06 MED ORDER — BUPRENORPHINE HCL-NALOXONE HCL 8-2 MG SL SUBL: 1.0000 | SUBLINGUAL_TABLET | Freq: Every day | SUBLINGUAL | Status: DC

## 2022-11-06 MED ORDER — IPRATROPIUM-ALBUTEROL 0.5-2.5 (3) MG/3ML IN SOLN: 3.0000 mL | RESPIRATORY_TRACT | Status: DC | PRN

## 2022-11-06 MED ORDER — ALBUTEROL SULFATE (2.5 MG/3ML) 0.083% IN NEBU
5.0000 mg | INHALATION_SOLUTION | Freq: Once | RESPIRATORY_TRACT | Status: AC
  Administered 2022-11-06: 5 mg via RESPIRATORY_TRACT
  Filled 2022-11-06: qty 6

## 2022-11-06 MED ORDER — PANTOPRAZOLE SODIUM 40 MG PO TBEC
40.0000 mg | DELAYED_RELEASE_TABLET | Freq: Every day | ORAL | Status: DC
  Administered 2022-11-06: 40 mg via ORAL
  Filled 2022-11-06: qty 1

## 2022-11-06 MED ORDER — AMLODIPINE BESYLATE 5 MG PO TABS
10.0000 mg | ORAL_TABLET | Freq: Every day | ORAL | Status: DC
  Administered 2022-11-07 – 2022-11-11 (×5): 10 mg via ORAL
  Filled 2022-11-06 (×5): qty 2

## 2022-11-06 MED ORDER — DM-GUAIFENESIN ER 30-600 MG PO TB12
1.0000 | ORAL_TABLET | Freq: Two times a day (BID) | ORAL | Status: DC
  Administered 2022-11-06 – 2022-11-09 (×6): 1 via ORAL
  Filled 2022-11-06 (×6): qty 1

## 2022-11-06 MED ORDER — ENOXAPARIN SODIUM 40 MG/0.4ML IJ SOSY
40.0000 mg | PREFILLED_SYRINGE | INTRAMUSCULAR | Status: DC
  Administered 2022-11-06 – 2022-11-07 (×2): 40 mg via SUBCUTANEOUS
  Filled 2022-11-06 (×2): qty 0.4

## 2022-11-06 MED ORDER — METHYLPREDNISOLONE SODIUM SUCC 125 MG IJ SOLR
125.0000 mg | Freq: Once | INTRAMUSCULAR | Status: AC
  Administered 2022-11-06: 125 mg via INTRAMUSCULAR
  Filled 2022-11-06: qty 2

## 2022-11-06 MED ORDER — MIRTAZAPINE 15 MG PO TABS
15.0000 mg | ORAL_TABLET | Freq: Every evening | ORAL | Status: DC | PRN
  Administered 2022-11-06 – 2022-11-10 (×5): 15 mg via ORAL
  Filled 2022-11-06 (×5): qty 1

## 2022-11-06 MED ORDER — METHYLPREDNISOLONE SODIUM SUCC 125 MG IJ SOLR: 125.0000 mg | Freq: Once | INTRAMUSCULAR | Status: DC

## 2022-11-06 NOTE — ED Notes (Signed)
EDP at bedside for ultrasound guided IV

## 2022-11-06 NOTE — H&P (Addendum)
History and Physical    Patient: [name] ZOX:[id number] DOB: [birth year] DOA: 11/06/2022 DOS: the patient was seen and examined on 11/06/2022 PCP: [facility]  Patient coming from: Home  Chief Complaint:  Chief Complaint  Patient presents with   Shortness of Breath   HPI: [name] is a 52 y.o. female with medical history significant of hypertension, GERD, Celexa, tobacco abuse who presents to the emergency department due to 2-day onset of chest tightness, wheezing, nonproductive cough and increasing shortness of breath.  Patient states that she has shortness of breath at rest and this worsens on exertion and when she is laying flat in bed.  She complained of for about 1 week of slight increase in leg swelling.  She endorsed more than 30 years of tobacco use, but she states that she has not been officially diagnosed to have COPD.  She was incarcerated and was released from prison in February, she complained of similar (but milder) symptoms which subsequently self resolved.  She denies fever, chills, headache, chest pain, nausea, vomiting, abdominal pain, sick contacts.  She decided to go to the ED for further evaluation and management.  ED Course:  In the emergency department, she was hemodynamically stable, though she was hypoxic with O2 sat of 81%, supplemental oxygen via [place] at 2 LPM was provided with improvement in O2 sats to 91%.  Workup in the ED showed normocytic anemia, WBC 8.9, platelets 342.  BMP was normal except for blood glucose of 154, BNP 921, troponin x 1 was 4. Chest x-ray showed 1. Likely mild pulmonary edema. 2. Nodular - like patchy airspace opacities within bilateral lower lung zones. Findings could represent infection/inflammation. Patient was treated with ceftriaxone and azithromycin, breathing treatment with albuterol and Atrovent was provided, magnesium was given, Solu-Medrol 25 mg x 1 was given.  Hospitalist was asked to  admit patient for further evaluation and management.  Review of Systems: Review of systems as noted in the HPI. All other systems reviewed and are negative.   Past Medical History:  Diagnosis Date   Anxiety    Arthritis    Cancer    Cervical cancer    Depressed    Drug addiction Hep C   Foot abscess, left    Gastroesophageal reflux disease    Gastroparesis    Hepatitis C    pt denies any treatment   Hypertension    Polysubstance dependence including opioid type drug, episodic abuse    Psoriasis    Renal disorder    kidney stones   Past Surgical History:  Procedure Laterality Date   APPENDECTOMY     APPLICATION OF WOUND VAC Left 10/10/2017   Procedure: APPLICATION OF WOUND VAC;  Surgeon: Nadara Mustard, MD;  Location: [facility];  Service: Orthopedics;  Laterality: Left;   ESOPHAGOGASTRODUODENOSCOPY  01/04/2008   Dr Teddy Spike reflux esophagitis, sm HH, gastritis, BRAVO positive for GERD on BID PPI   I & D EXTREMITY Left 10/03/2017   Procedure: IRRIGATION AND DEBRIDEMENT EXTREMITY;  Surgeon: Nadara Mustard, MD;  [facility];  Service: Orthopedics;  Laterality: Left;   IRRIGATION AND DEBRIDEMENT FOOT Left 10/03/2017   LASER ABLATION CONDYLOMA CERVICAL / VULVAR     mandible surgery for fracture     SKIN SPLIT GRAFT Left 10/10/2017   Procedure: SKIN GRAFT SPLIT THICKNESS LEFT FOOT;  Surgeon: Nadara Mustard, MD;  Location: [facility];  Service: Orthopedics;  Laterality: Left;   TUBAL LIGATION  Social History:  reports that she has been smoking cigarettes. She has a 28.00 pack-year smoking history. She has never used smokeless tobacco. She reports current drug use. Drugs: Cocaine, IV, "Crack" cocaine, Marijuana, and Opium. She reports that she does not drink alcohol.   Allergies  Allergen Reactions   Tramadol Hcl Anaphylaxis, Hives, Itching and Swelling    Of tongue   Ketorolac Nausea And Vomiting, Rash and Other (See Comments)   Propoxyphene Rash    Family History   Problem Relation Age of Onset   Hyperlipidemia Mother    Diabetes Mother    Hypertension Mother    Hypertension Sister    Hepatitis C Sister    Hypertension Father    Colon cancer Neg Hx    Liver disease Neg Hx      Prior to Admission medications   Medication Sig Start Date End Date Taking? Authorizing Provider  amLODipine (NORVASC) 10 MG tablet Take 1 tablet (10 mg total) by mouth daily. 10/25/16  Yes Eber Hong, MD  aspirin EC 81 MG tablet Take 81 mg by mouth 2 (two) times a week.    Yes [provider]  Aspirin-Acetaminophen-Caffeine (GOODYS EXTRA STRENGTH PO) Take 1 Package by mouth daily as needed (pain).   Yes [provider]  Buprenorphine HCl-Naloxone HCl 8-2 MG FILM Place under the tongue 2 (two) times daily. 11/04/22  Yes [provider]  citalopram (CELEXA) 40 MG tablet Take 40 mg by mouth daily.   Yes [provider]  ibuprofen (ADVIL,MOTRIN) 200 MG tablet Take 800 mg by mouth every 6 (six) hours as needed for moderate pain.   Yes [provider]  metoprolol tartrate (LOPRESSOR) 50 MG tablet Take 50 mg by mouth 2 (two) times daily.   Yes [provider]  mirtazapine (REMERON) 15 MG tablet Take 15 mg by mouth at bedtime as needed (depression).   Yes [provider]  naproxen (NAPROSYN) 375 MG tablet Take 375 mg by mouth 2 (two) times daily as needed for moderate pain.   Yes [provider]  omeprazole (PRILOSEC) 20 MG capsule Take 40 mg by mouth daily.   Yes [provider]    Physical Exam: BP 127/71   Pulse 82   Temp 98 F (36.7 C)   Resp (!) 21   Ht  (1.575 m)   Wt 98.4 kg   LMP 01/20/2015   SpO2 96%   BMI 39.69 kg/m   General: 52 y.o. year-old female well developed well nourished in no acute distress.  Alert and oriented x3. HEENT: NCAT, EOMI Neck: Supple, trachea medial Cardiovascular: Regular rate and rhythm with no rubs or gallops.  No thyromegaly or JVD noted.  Bilateral  trace lower extremity edema. 2/4 pulses in all 4 extremities. Respiratory: Scattered wheezes with bilateral Rales in lower lobes on auscultation.   Abdomen: Soft, nontender nondistended with normal bowel sounds x4 quadrants. Muskuloskeletal: No cyanosis, clubbing noted bilaterally Neuro: CN II-XII intact, strength 5/5 x 4, sensation, reflexes intact Skin: No ulcerative lesions noted or rashes Psychiatry: Mood is appropriate for condition and setting          Labs on Admission:  Basic Metabolic Panel: Recent Labs  Lab 11/06/22 1711  NA 139  K 4.4  CL 104  CO2 27  GLUCOSE 154*  BUN 8  CREATININE 0.93  CALCIUM 8.5*   Liver Function Tests: No results for input(s): "AST", "ALT", "ALKPHOS", "BILITOT", "PROT", "ALBUMIN" in the last 168 hours. No results  for input(s): "LIPASE", "AMYLASE" in the last 168 hours. No results for input(s): "AMMONIA" in the last 168 hours. CBC: Recent Labs  Lab 11/06/22 1711  WBC 8.9  NEUTROABS 5.6  HGB 7.8*  HCT 27.1*  MCV 93.8  PLT 342   Cardiac Enzymes: No results for input(s): "CKTOTAL", "CKMB", "CKMBINDEX", "TROPONINI" in the last 168 hours.  BNP (last 3 results) Recent Labs    11/06/22 1711  BNP 921.0*    ProBNP (last 3 results) No results for input(s): "PROBNP" in the last 8760 hours.  CBG: No results for input(s): "GLUCAP" in the last 168 hours.  Radiological Exams on Admission: DG Chest Portable 1 View  Result Date: 11/06/2022 CLINICAL DATA:  shortness of breath EXAM: PORTABLE CHEST 1 VIEW COMPARISON:  Chest x-ray 08/30/2015 FINDINGS: The heart and mediastinal contours are within normal limits. Nodular - like patchy airspace opacities within bilateral lower lung zones. Chronic coarsened interstitial markings with slightly more prominent interstitial markings. No pleural effusion. No pneumothorax. No acute osseous abnormality. IMPRESSION: 1. Likely mild pulmonary edema. 2. Nodular - like patchy airspace opacities within bilateral  lower lung zones. Findings could represent infection/inflammation. Followup PA and lateral chest X-ray is recommended in 3-4 weeks following therapy to ensure resolution and exclude underlying malignancy. Electronically Signed   By: Tish Frederickson M.D.   On: 11/06/2022 18:25    EKG: I independently viewed the EKG done and my findings are as followed: Normal sinus rhythm at a rate of 76 bpm  Assessment/Plan Present on Admission:  Acute respiratory failure with hypoxia  Tobacco abuse  GERD  Depression  Polysubstance dependence including opioid type drug, episodic abuse ([facility])  Principal Problem:   Acute respiratory failure with hypoxia Active Problems:   Tobacco abuse   Depression   GERD   Polysubstance dependence including opioid type drug, episodic abuse ([facility])   Elevated brain natriuretic peptide (BNP) level   CAP (community acquired pneumonia)   Acute bronchitis  Acute respiratory failure with hypoxia This may be due to multifactorial including possible new onset CHF, CAP, acute bronchitis/undiagnosed COPD Continue supplemental oxygen to maintain O2 sats > 92% with plan to wean patient off supplemental oxygen as tolerated  Elevated BNP rule out CHF BNP 921 Chest x-ray was suggestive of mild pulmonary edema Continue total input/output, daily weights and fluid restriction Continue IV Lasix 20 twice daily Continue heart healthy diet  EKG personally reviewed showed normal sinus rhythm at a rate of 76 with primary Echocardiogram will be done in the morning   Possible CAP POA Chest x-ray was suggestive of nodular-like infiltrate suggestive of inflammation/infection Patient was empirically treated with IV ceftriaxone and azithromycin with suspicion for CAP We shall continue same at this time with plan to de-escalate/discontinue based on blood culture, sputum culture, urine Legionella, strep pneumo and procalcitonin Continue Tylenol as needed Continue Mucinex, incentive spirometry,  flutter valve   Acute bronchitis, rule out undiagnosed COPD Continue duo nebs, Mucinex, Robitussin, Solu-Medrol, azithromycin. Continue Protonix to prevent steroid-induced ulcer Continue incentive spirometry and flutter valve Continue supplemental oxygen to maintain O2 sat > 92% with plan to wean patient off oxygen as tolerated  Normocytic anemia Hemoglobin at 7.8, denies any known bleeding Continue to monitor H/H with morning labs  Essential hypertension (controlled) Continue amlodipine  Depression Continue Celexa, Remeron  GERD Continue Protonix  Polysubstance dependence Continue Suboxone  Obesity (BMI 39.69) Diet and lifestyle modification  Tobacco abuse Patient was counseled on tobacco abuse cessation  DVT prophylaxis: Lovenox, SCDs  Code Status: Full code  Consults: None  Family Communication: None at bedside  Severity of Illness: The appropriate patient status for this patient is INPATIENT. Inpatient status is judged to be reasonable and necessary in order to provide the required intensity of service to ensure the patient's safety. The patient's presenting symptoms, physical exam findings, and initial radiographic and laboratory data in the context of their chronic comorbidities is felt to place them at high risk for further clinical deterioration. Furthermore, it is not anticipated that the patient will be medically stable for discharge from the hospital within 2 midnights of admission.   * I certify that at the point of admission it is my clinical judgment that the patient will require inpatient hospital care spanning beyond 2 midnights from the point of admission due to high intensity of service, high risk for further deterioration and high frequency of surveillance required.*  Author: Frankey Shown, DO 11/06/2022 9:06 PM  For on call review [email or web address].

## 2022-11-06 NOTE — ED Triage Notes (Signed)
Pt c/o sob with exertion with wheezing for a couple of days. Ems gave 2.5mg  albuterol.

## 2022-11-06 NOTE — ED Provider Notes (Signed)
[place] [facility] AT [facility] Provider Note   CSN: [id number] Arrival date & time: 11/06/22  1647     History  Chief Complaint  Patient presents with   Shortness of Breath    [name] is a 52 y.o. female.   Shortness of Breath Associated symptoms: chest pain and cough   Associated symptoms: no abdominal pain, no fever and no vomiting        [name] is a 52 y.o. female with past medical history of hypertension, polysubstance dependence, reflux, hepatitis C who presents to the Emergency Department complaining of shortness of breath, chest tightness and wheezing.  She reports feeling bad for 2 days.  Describes having pressure/tightness of her central chest.  Occasional cough that is nonproductive.  Denies fever or chills.  No recent sick contacts, new medications or chemical exposures.  She denies history of asthma.  States she was incarcerated and was released from prison in February, was not feeling well at that time and having similar symptoms but notes that symptoms eventually resolved until several days ago.  Home Medications Prior to Admission medications   Medication Sig Start Date End Date Taking? Authorizing Provider  albuterol (PROVENTIL HFA;VENTOLIN HFA) 108 (90 Base) MCG/ACT inhaler Inhale 1-2 puffs into the lungs every 6 (six) hours as needed for wheezing or shortness of breath.    [provider]  amLODipine (NORVASC) 10 MG tablet Take 1 tablet (10 mg total) by mouth daily. 10/25/16   Eber Hong, MD  aspirin EC 81 MG tablet Take 81 mg by mouth 2 (two) times a week.     [provider]  Aspirin-Acetaminophen-Caffeine (GOODYS EXTRA STRENGTH PO) Take 1 Package by mouth daily as needed (pain).    [provider]  clonazePAM (KLONOPIN) 1 MG tablet Take 1 mg by mouth 3 (three) times daily.    [provider]  doxycycline (VIBRAMYCIN) 50 MG capsule Take 2 capsules (100 mg total) by mouth 2  (two) times daily. 10/06/17   Alwyn Ren, MD  ibuprofen (ADVIL,MOTRIN) 200 MG tablet Take 800 mg by mouth every 6 (six) hours as needed for moderate pain.    [provider]  lisinopril-hydrochlorothiazide (PRINZIDE,ZESTORETIC) 20-12.5 MG tablet Take 1 tablet by mouth daily.    [provider]  metoprolol succinate (TOPROL-XL) 25 MG 24 hr tablet Take 1 tablet (25 mg total) by mouth daily. 10/06/17   Alwyn Ren, MD  nicotine (NICODERM CQ - DOSED IN MG/24 HOURS) 21 mg/24hr patch Place 1 patch (21 mg total) onto the skin daily. 10/06/17   Alwyn Ren, MD  oxyCODONE-acetaminophen (PERCOCET) 7.5-325 MG tablet Take 1 tablet by mouth 2 (two) times daily as needed for moderate pain or severe pain.    [provider]  oxyCODONE-acetaminophen (PERCOCET/ROXICET) 5-325 MG tablet Take 1 tablet by mouth every 8 (eight) hours as needed for severe pain. 11/10/17   Nadara Mustard, MD  oxyCODONE-acetaminophen (PERCOCET/ROXICET) 5-325 MG tablet Take 1 tablet by mouth every 6 (six) hours as needed for severe pain. 11/18/17   Nadara Mustard, MD  oxyCODONE-acetaminophen (PERCOCET/ROXICET) 5-325 MG tablet Take 1 tablet by mouth every 8 (eight) hours as needed. 12/10/17   Nadara Mustard, MD  oxyCODONE-acetaminophen (PERCOCET/ROXICET) 5-325 MG tablet Take 1 tablet by mouth every 8 (eight) hours as needed. 12/29/17   Nadara Mustard, MD      Allergies    Tramadol hcl, Ketorolac, and Propoxyphene    Review  of Systems   Review of Systems  Constitutional:  Negative for chills and fever.  Respiratory:  Positive for cough, chest tightness and shortness of breath.   Cardiovascular:  Positive for chest pain.  Gastrointestinal:  Negative for abdominal pain, nausea and vomiting.  Musculoskeletal:  Negative for back pain.  Neurological:  Negative for weakness and numbness.    Physical Exam Updated Vital Signs BP 133/80   Pulse 77   Temp 98 F (36.7 C)   Resp 20   Ht 5\' 2"   (1.575 m)   Wt 98.4 kg   LMP 01/20/2015   SpO2 90%   BMI 39.69 kg/m  Physical Exam Vitals and nursing note reviewed.  Constitutional:      Appearance: She is ill-appearing.  HENT:     Mouth/Throat:     Mouth: Mucous membranes are moist.  Cardiovascular:     Rate and Rhythm: Normal rate and regular rhythm.  Pulmonary:     Effort: Respiratory distress present.     Breath sounds: Wheezing present.     Comments: Lung sounds diminished bilaterally, audible wheezing.  Increased work of breathing on exam.  Patient currently on 3 L O2 Abdominal:     Palpations: Abdomen is soft.     Tenderness: There is no abdominal tenderness.  Musculoskeletal:     Right lower leg: No edema.     Left lower leg: No edema.  Skin:    General: Skin is warm.     Capillary Refill: Capillary refill takes less than 2 seconds.  Neurological:     General: No focal deficit present.     Mental Status: She is alert.     Motor: No weakness.     ED Results / Procedures / Treatments   Labs (all labs ordered are listed, but only abnormal results are displayed) Labs Reviewed  CBC WITH DIFFERENTIAL/PLATELET - Abnormal; Notable for the following components:      Result Value   RBC 2.89 (*)    Hemoglobin 7.8 (*)    HCT 27.1 (*)    MCHC 28.8 (*)    RDW 17.9 (*)    nRBC 0.9 (*)    All other components within normal limits  BASIC METABOLIC PANEL - Abnormal; Notable for the following components:   Glucose, Bld 154 (*)    Calcium 8.5 (*)    All other components within normal limits  BRAIN NATRIURETIC PEPTIDE - Abnormal; Notable for the following components:   B Natriuretic Peptide 921.0 (*)    All other components within normal limits  SARS CORONAVIRUS 2 BY RT PCR  TROPONIN I (HIGH SENSITIVITY)    EKG None  Radiology DG Chest Portable 1 View  Result Date: 11/06/2022 CLINICAL DATA:  shortness of breath EXAM: PORTABLE CHEST 1 VIEW COMPARISON:  Chest x-ray 08/30/2015 FINDINGS: The heart and mediastinal  contours are within normal limits. Nodular - like patchy airspace opacities within bilateral lower lung zones. Chronic coarsened interstitial markings with slightly more prominent interstitial markings. No pleural effusion. No pneumothorax. No acute osseous abnormality. IMPRESSION: 1. Likely mild pulmonary edema. 2. Nodular - like patchy airspace opacities within bilateral lower lung zones. Findings could represent infection/inflammation. Followup PA and lateral chest X-ray is recommended in 3-4 weeks following therapy to ensure resolution and exclude underlying malignancy. Electronically Signed   By: Tish Frederickson M.D.   On: 11/06/2022 18:25    Procedures Procedures    Medications Ordered in ED Medications  methylPREDNISolone sodium succinate (SOLU-MEDROL) 125 mg/2 mL  injection 125 mg (has no administration in time range)  albuterol (PROVENTIL) (2.5 MG/3ML) 0.083% nebulizer solution 5 mg (has no administration in time range)  ipratropium (ATROVENT) nebulizer solution 0.5 mg (has no administration in time range)    ED Course/ Medical Decision Making/ A&P Clinical Course as of 11/06/22 1924  Wed Nov 06, 2022  1923 Patient with wheezing and hypoxia- + smoker, ? Copd? [AH]    Clinical Course User Index [AH] Arthor Captain, PA-C                             Medical Decision Making Patient here with chest tightness pressure, wheezing, shortness of breath.  Symptoms present for 2 to 3 days.  No reported fever or significant cough. 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  Audible wheezing on exam.  Patient was given albuterol neb in route by EMS and she reports improvement of her breathing since receiving treatment.  Able to speak in full and complete sentences without respiratory distress while remaining on 3 L O2  Differential includes but not limited to bronchospasm, pneumonia, PE, ACS, viral process.    Amount and/or Complexity of Data Reviewed Labs: ordered.     Details: Labs no evidence of leukocytosis, hemoglobin 7.8.  No recent labs available for comparison.  Had normal hemoglobin 5 years ago.  No history of anemia.  Chemistries without significant derangement, BNP 921.  Troponin pending Radiology: ordered.    Details: Chest x-ray shows mild pulmonary edema and nodular opacities within bilateral lower lung zones ECG/medicine tests: ordered.    Details: EKG shows sinus rhythm Discussion of management or test interpretation with external provider(s): Nursing having difficult time accessing IV, Solu-Medrol ordered IM.  Patient receiving albuterol neb.  O2 sat in mid 90s on 3 L.  Receiving albuterol neb here.  Chest x-ray shows likely pneumonia. Etiology of amenia unclear.  Discussed with Arthor Captain, PA-C who assumes care.  Pt will require abx, and hospital admission  Risk Prescription drug management.           Final Clinical Impression(s) / ED Diagnoses Final diagnoses:  Acute respiratory failure with hypoxia  Community acquired pneumonia of left lower lobe of lung  Anemia, unspecified type    Rx / DC Orders ED Discharge Orders     None         Pauline Aus, PA-C 11/06/22 1941    Derwood Kaplan, MD 11/07/22 2346

## 2022-11-07 ENCOUNTER — Inpatient Hospital Stay: Payer: Self-pay

## 2022-11-07 ENCOUNTER — Other Ambulatory Visit: Payer: Self-pay

## 2022-11-07 ENCOUNTER — Inpatient Hospital Stay (HOSPITAL_COMMUNITY): Payer: Medicaid Other

## 2022-11-07 DIAGNOSIS — I509 Heart failure, unspecified: Secondary | ICD-10-CM | POA: Diagnosis not present

## 2022-11-07 DIAGNOSIS — J9601 Acute respiratory failure with hypoxia: Secondary | ICD-10-CM | POA: Diagnosis not present

## 2022-11-07 LAB — IRON AND TIBC
Iron: 23 ug/dL — ABNORMAL LOW (ref 28–170)
Saturation Ratios: 5 % — ABNORMAL LOW (ref 10.4–31.8)
TIBC: 455 ug/dL — ABNORMAL HIGH (ref 250–450)
UIBC: 432 ug/dL

## 2022-11-07 LAB — CBC
HCT: 26.8 % — ABNORMAL LOW (ref 36.0–46.0)
Hemoglobin: 7.7 g/dL — ABNORMAL LOW (ref 12.0–15.0)
MCH: 26.9 pg (ref 26.0–34.0)
MCHC: 28.7 g/dL — ABNORMAL LOW (ref 30.0–36.0)
MCV: 93.7 fL (ref 80.0–100.0)
Platelets: 301 10*3/uL (ref 150–400)
RBC: 2.86 MIL/uL — ABNORMAL LOW (ref 3.87–5.11)
RDW: 17.6 % — ABNORMAL HIGH (ref 11.5–15.5)
WBC: 7.6 10*3/uL (ref 4.0–10.5)
nRBC: 0.9 % — ABNORMAL HIGH (ref 0.0–0.2)

## 2022-11-07 LAB — ECHOCARDIOGRAM COMPLETE
AR max vel: 3.51 cm2
AV Area VTI: 3.59 cm2
AV Area mean vel: 3.31 cm2
AV Mean grad: 6 mmHg
AV Peak grad: 11.7 mmHg
Ao pk vel: 1.71 m/s
Est EF: >75
Height: 62 in
MV M vel: 5.53 m/s
MV Peak grad: 122.3 mmHg
Radius: 0.9 cm
S' Lateral: 3.5 cm
Weight: 3541.47 oz

## 2022-11-07 LAB — COMPREHENSIVE METABOLIC PANEL
ALT: 23 U/L (ref 0–44)
AST: 27 U/L (ref 15–41)
Albumin: 3.1 g/dL — ABNORMAL LOW (ref 3.5–5.0)
Alkaline Phosphatase: 64 U/L (ref 38–126)
Anion gap: 10 (ref 5–15)
BUN: 9 mg/dL (ref 6–20)
CO2: 25 mmol/L (ref 22–32)
Calcium: 8.2 mg/dL — ABNORMAL LOW (ref 8.9–10.3)
Chloride: 102 mmol/L (ref 98–111)
Creatinine, Ser: 0.89 mg/dL (ref 0.44–1.00)
GFR, Estimated: >60 mL/min (ref >60)
Glucose, Bld: 239 mg/dL — ABNORMAL HIGH (ref 70–99)
Potassium: 4 mmol/L (ref 3.5–5.1)
Sodium: 137 mmol/L (ref 135–145)
Total Bilirubin: 0.1 mg/dL — ABNORMAL LOW (ref 0.3–1.2)
Total Protein: 7.3 g/dL (ref 6.5–8.1)

## 2022-11-07 LAB — VITAMIN B12: Vitamin B-12: 273 pg/mL (ref 180–914)

## 2022-11-07 LAB — PHOSPHORUS: Phosphorus: 3.2 mg/dL (ref 2.5–4.6)

## 2022-11-07 LAB — FOLATE: Folate: 11 ng/mL (ref >5.9)

## 2022-11-07 LAB — MAGNESIUM: Magnesium: 2.3 mg/dL (ref 1.7–2.4)

## 2022-11-07 LAB — RETICULOCYTES
Immature Retic Fract: 36.3 % — ABNORMAL HIGH (ref 2.3–15.9)
RBC.: 2.84 MIL/uL — ABNORMAL LOW (ref 3.87–5.11)
Retic Count, Absolute: 116.7 10*3/uL (ref 19.0–186.0)
Retic Ct Pct: 4.1 % — ABNORMAL HIGH (ref 0.4–3.1)

## 2022-11-07 LAB — FERRITIN: Ferritin: 12 ng/mL (ref 11–307)

## 2022-11-07 LAB — STREP PNEUMONIAE URINARY ANTIGEN: Strep Pneumo Urinary Antigen: NEGATIVE

## 2022-11-07 LAB — PROCALCITONIN: Procalcitonin: 0.49 ng/mL

## 2022-11-07 LAB — HIV ANTIBODY (ROUTINE TESTING W REFLEX): HIV Screen 4th Generation wRfx: NONREACTIVE

## 2022-11-07 LAB — CULTURE, BLOOD (ROUTINE X 2)

## 2022-11-07 MED ORDER — SODIUM CHLORIDE 0.9 % IV SOLN
250.0000 mg | Freq: Every day | INTRAVENOUS | Status: AC
  Administered 2022-11-08: 250 mg via INTRAVENOUS
  Filled 2022-11-07 (×2): qty 20

## 2022-11-07 MED ORDER — CHLORHEXIDINE GLUCONATE CLOTH 2 % EX PADS
6.0000 | MEDICATED_PAD | Freq: Every day | CUTANEOUS | Status: DC
  Administered 2022-11-07 – 2022-11-11 (×5): 6 via TOPICAL

## 2022-11-07 MED ORDER — METHYLPREDNISOLONE SODIUM SUCC 40 MG IJ SOLR
40.0000 mg | Freq: Once | INTRAMUSCULAR | Status: AC
  Administered 2022-11-07: 40 mg via INTRAMUSCULAR
  Filled 2022-11-07: qty 1

## 2022-11-07 MED ORDER — HYDROXYZINE HCL 10 MG PO TABS
10.0000 mg | ORAL_TABLET | Freq: Three times a day (TID) | ORAL | Status: DC | PRN
  Administered 2022-11-07: 10 mg via ORAL
  Filled 2022-11-07: qty 1

## 2022-11-07 MED ORDER — LIDOCAINE HCL (PF) 1 % IJ SOLN
INTRAMUSCULAR | Status: AC
  Administered 2022-11-07: 5 mL
  Filled 2022-11-07: qty 5

## 2022-11-07 MED ORDER — SODIUM CHLORIDE 0.9% FLUSH: 10.0000 mL | INTRAVENOUS | Status: DC | PRN

## 2022-11-07 MED ORDER — FUROSEMIDE 10 MG/ML IJ SOLN
40.0000 mg | Freq: Two times a day (BID) | INTRAMUSCULAR | Status: DC
  Administered 2022-11-08 – 2022-11-10 (×5): 40 mg via INTRAVENOUS
  Filled 2022-11-07 (×5): qty 4

## 2022-11-07 MED ORDER — FUROSEMIDE 40 MG PO TABS
40.0000 mg | ORAL_TABLET | Freq: Once | ORAL | Status: AC
  Administered 2022-11-07: 40 mg via ORAL
  Filled 2022-11-07: qty 1

## 2022-11-07 MED ORDER — CEFTRIAXONE SODIUM 1 G IJ SOLR
1.0000 g | Freq: Once | INTRAMUSCULAR | Status: AC
  Administered 2022-11-07: 1 g via INTRAMUSCULAR
  Filled 2022-11-07: qty 10

## 2022-11-07 MED ORDER — SODIUM CHLORIDE 0.9% FLUSH
10.0000 mL | Freq: Two times a day (BID) | INTRAVENOUS | Status: DC
  Administered 2022-11-07 – 2022-11-11 (×8): 10 mL

## 2022-11-07 MED ORDER — BUPRENORPHINE HCL-NALOXONE HCL 8-2 MG SL SUBL
1.0000 | SUBLINGUAL_TABLET | Freq: Two times a day (BID) | SUBLINGUAL | Status: DC
  Administered 2022-11-07 – 2022-11-11 (×8): 1 via SUBLINGUAL
  Filled 2022-11-07 (×8): qty 1

## 2022-11-07 NOTE — Progress Notes (Signed)
  PICC team reached out and explained they would arrive later today. Contacted MD to confirm if pt meds could be switched orally. Confirmed he would place one time order for IM medications until new PICC is placed.

## 2022-11-07 NOTE — Progress Notes (Signed)
PROGRESS NOTE    [name]  ZOX:[id number] DOB: 08/14/1970 DOA: 11/06/2022 PCP: [facility]   Brief Narrative:  [name] is a 52 y.o. female with medical history significant of hypertension, GERD, Celexa, tobacco abuse who presents to the emergency department due to 2-day onset of chest tightness, wheezing, nonproductive cough and increasing shortness of breath.  Patient was admitted with acute hypoxemic respiratory failure-multifactorial in the setting of new onset CHF, community-acquired pneumonia, and possible COPD exacerbation.  Patient noted to have moderate to severe MR on 2D echocardiogram.  Cardiology recommending TEE on Monday.  Assessment & Plan:   Principal Problem:   Acute respiratory failure with hypoxia Active Problems:   Tobacco abuse   Depression   GERD   Polysubstance dependence including opioid type drug, episodic abuse (HCC)   Elevated brain natriuretic peptide (BNP) level   CAP (community acquired pneumonia)   Acute bronchitis  Assessment and Plan:  Acute respiratory failure with hypoxia This may be due to multifactorial including possible new onset CHF, CAP, acute bronchitis/undiagnosed COPD Continue supplemental oxygen to maintain O2 sats > 92% with plan to wean patient off supplemental oxygen as tolerated   Elevated BNP rule out CHF BNP 921 Chest x-ray was suggestive of mild pulmonary edema Continue total input/output, daily weights and fluid restriction Increase Lasix to IV 40 mg twice daily Continue heart healthy diet      EKG personally reviewed showed normal sinus rhythm at a rate of 76 with primary Echocardiogram read by cardiology with moderate to severe MR noted.  Recommendations for TEE by Monday, please place consult for TEE over the weekend and keep n.p.o. after midnight on Sunday   Possible CAP POA Chest x-ray was suggestive of nodular-like infiltrate suggestive of inflammation/infection Patient  was empirically treated with IV ceftriaxone and azithromycin with suspicion for CAP We shall continue same at this time with plan to de-escalate/discontinue based on blood culture, sputum culture, urine Legionella, strep pneumo and procalcitonin Continue Tylenol as needed Continue Mucinex, incentive spirometry, flutter valve    Acute bronchitis, rule out undiagnosed COPD Continue duo nebs, Mucinex, Robitussin, Solu-Medrol, azithromycin. Continue Protonix to prevent steroid-induced ulcer Continue incentive spirometry and flutter valve Continue supplemental oxygen to maintain O2 sat > 92% with plan to wean patient off oxygen as tolerated   Normocytic anemia with iron deficiency Hemoglobin at 7.8, denies any known bleeding Stool occult ordered and pending IV iron ordered Continue to monitor H/H with morning labs   Essential hypertension (controlled) Continue amlodipine   Depression Continue Celexa, Remeron   GERD Continue Protonix   Polysubstance dependence Continue Suboxone   Morbid Obesity (BMI 41) Diet and lifestyle modification   Tobacco abuse Patient was counseled on tobacco abuse cessation    DVT prophylaxis:Lovenox Code Status: Full Family Communication: None at bedside Disposition Plan: Continue ongoing treatment as above Status is: Inpatient Remains inpatient appropriate because: Need for IV medications   Consultants:  None  Procedures:  None  Antimicrobials:  Anti-infectives (From admission, onward)    Start     Dose/Rate Route Frequency Ordered Stop   11/07/22 2200  azithromycin (ZITHROMAX) 500 mg in sodium chloride 0.9 % 250 mL IVPB        50 0 mg 250 mL/hr over 60 Minutes Intravenous Every 24 hours 11/06/22 2054 11/11/22 2159   11/07/22 1000  cefTRIAXone (ROCEPHIN) 2 g in sodium chloride 0.9 % 100 mL IVPB        2 g 200  mL/hr over 30 Minutes Intravenous Every 24 hours 11/06/22 2054 11/11/22 0959   11/06/22 1930  cefTRIAXone (ROCEPHIN) 1 g in sodium  chloride 0.9 % 100 mL IVPB        1 g 200 mL/hr over 30 Minutes Intravenous  Once 11/06/22 1926 11/06/22 2217   11/06/22 1930  azithromycin (ZITHROMAX) 500 mg in sodium chloride 0.9 % 250 mL IVPB        500 mg 250 mL/hr over 60 Minutes Intravenous  Once 11/06/22 1926 11/06/22 2346      Subjective: Patient seen and evaluated today with no new acute complaints or concerns. No acute concerns or events noted overnight.  She continues to have some ongoing shortness of breath.  Objective: Vitals:   11/07/22 0223 11/07/22 0256 11/07/22 0639 11/07/22 0851  BP:  (!) 143/88 126/84   Pulse:  72 66   Resp:   14   Temp:  98.6 F (37 C) 98.5 F (36.9 C)   TempSrc:  Oral Oral   SpO2: 95% 95% 99% 99%  Weight:   100.4 kg   Height:        Intake/Output Summary (Last 24 hours) at 11/07/2022 1111 Last data filed at 11/07/2022 0500 Gross per 24 hour  Intake 240 ml  Output 400 ml  Net -160 ml   Filed Weights   11/06/22 2230 11/06/22 2240 11/07/22 0639  Weight: 102.7 kg 102.7 kg 100.4 kg    Examination:  General exam: Appears calm and comfortable  Respiratory system: Clear to auscultation. Respiratory effort normal. Cardiovascular system: S1 & S2 heard, RRR.  Gastrointestinal system: Abdomen is soft Central nervous system: Alert and awake Extremities: No edema Skin: No significant lesions noted Psychiatry: Flat affect.    Data Reviewed: I have personally reviewed following labs and imaging studies  CBC: Recent Labs  Lab 11/06/22 1711 11/07/22 0503  WBC 8.9 7.6  NEUTROABS 5.6  --   HGB 7.8* 7.7*  HCT 27.1* 26.8*  MCV 93.8 93.7  PLT 342 301   Basic Metabolic Panel: Recent Labs  Lab 11/06/22 1711 11/07/22 0503  NA 139 137  K 4.4 4.0  CL 104 102  CO2 27 25  GLUCOSE 154* 239*  BUN 8 9  CREATININE 0.93 0.89  CALCIUM 8.5* 8.2*  MG  --  2.3  PHOS  --  3.2   GFR: Estimated Creatinine Clearance: 82.9 mL/min (by C-G formula based on SCr of 0.89 mg/dL). Liver Function  Tests: Recent Labs  Lab 11/07/22 0503  AST 27  ALT 23  ALKPHOS 64  BILITOT 0.1*  PROT 7.3  ALBUMIN 3.1*   No results for input(s): "LIPASE", "AMYLASE" in the last 168 hours. No results for input(s): "AMMONIA" in the last 168 hours. Coagulation Profile: No results for input(s): "INR", "PROTIME" in the last 168 hours. Cardiac Enzymes: No results for input(s): "CKTOTAL", "CKMB", "CKMBINDEX", "TROPONINI" in the last 168 hours. BNP (last 3 results) No results for input(s): "PROBNP" in the last 8760 hours. HbA1C: No results for input(s): "HGBA1C" in the last 72 hours. CBG: No results for input(s): "GLUCAP" in the last 168 hours. Lipid Profile: No results for input(s): "CHOL", "HDL", "LDLCALC", "TRIG", "CHOLHDL", "LDLDIRECT" in the last 72 hours. Thyroid Function Tests: No results for input(s): "TSH", "T4TOTAL", "FREET4", "T3FREE", "THYROIDAB" in the last 72 hours. Anemia Panel: Recent Labs    11/07/22 0503  VITAMINB12 273  FOLATE 11.0  FERRITIN 12  TIBC 455*  IRON 23*  RETICCTPCT 4.1*   Sepsis Labs:  Recent Labs  Lab 11/07/22 0503  PROCALCITON 0.49    Recent Results (from the past 240 hour(s))  SARS Coronavirus 2 by RT PCR (hospital order, performed in [facility] hospital lab) *cepheid single result test* Anterior Nasal Swab     Status: None   Collection Time: 11/06/22  7:33 PM   Specimen: Anterior Nasal Swab  Result Value Ref Range Status   SARS Coronavirus 2 by RT PCR NEGATIVE NEGATIVE Final    Comment: (NOTE) SARS-CoV-2 target nucleic acids are NOT DETECTED.  The SARS-CoV-2 RNA is generally detectable in upper and lower respiratory specimens during the acute phase of infection. The lowest concentration of SARS-CoV-2 viral copies this assay can detect is 250 copies / mL. A negative result does not preclude SARS-CoV-2 infection and should not be used as the sole basis for treatment or other patient management decisions.  A negative result may occur with improper  specimen collection / handling, submission of specimen other than nasopharyngeal swab, presence of viral mutation(s) within the areas targeted by this assay, and inadequate number of viral copies (<250 copies / mL). A negative result must be combined with clinical observations, patient history, and epidemiological information.  Fact Sheet for Patients:   RoadLapTop.co.za  Fact Sheet for Healthcare Providers: http://kim-miller.com/  This test is not yet approved or  cleared by the [place] FDA and has been authorized for detection and/or diagnosis of SARS-CoV-2 by FDA under an Emergency Use Authorization (EUA).  This EUA will remain in effect (meaning this test can be used) for the duration of the COVID-19 declaration under Section 564(b)(1) of the Act, 21 U.S.C. section 360bbb-3(b)(1), unless the authorization is terminated or revoked sooner.  Performed at [facility], [address] [place], [place] [address]   Culture, blood (routine x 2) Call MD if unable to obtain prior to antibiotics being given     Status: None (Preliminary result)   Collection Time: 11/06/22  9:14 PM   Specimen: BLOOD  Result Value Ref Range Status   Specimen Description BLOOD BLOOD RIGHT ARM  Final   Special Requests   Final    BOTTLES DRAWN AEROBIC AND ANAEROBIC Blood Culture adequate volume   Culture   Final    NO GROWTH < 12 HOURS Performed at [facility], [address] [place], [place] [address]    Report Status PENDING  Incomplete  Culture, blood (routine x 2) Call MD if unable to obtain prior to antibiotics being given     Status: None (Preliminary result)   Collection Time: 11/06/22 10:04 PM   Specimen: BLOOD RIGHT ARM  Result Value Ref Range Status   Specimen Description BLOOD RIGHT ARM  Final   Special Requests   Final    BOTTLES DRAWN AEROBIC AND ANAEROBIC Blood Culture results may not be optimal due to an excessive volume of blood received  in culture bottles   Culture   Final    NO GROWTH < 12 HOURS Performed at [facility], [address] [place], [place] [address]    Report Status PENDING  Incomplete         Radiology Studies: ECHOCARDIOGRAM COMPLETE  Result Date: 11/07/2022    ECHOCARDIOGRAM REPORT   Patient Name:   SHARNI NEGRON Blew Date of Exam: 11/07/2022 Medical Rec #:  [id number]           Height:       62.0 in Accession #:    [id number]  Weight:       221.3 lb Date of Birth:  10/15/1970           BSA:          1.996 m Patient Age:    51 years            BP:           126/84 mmHg Patient Gender: F                   HR:           67 bpm. [facility] Procedure: 2D Echo, Cardiac Doppler and Color Doppler Indications:    I50.40* Unspecified combined systolic (congestive) and diastolic                 (congestive) heart failure  History:        Patient has no prior history of Echocardiogram examinations.                 Signs/Symptoms:Shortness of Breath and Dyspnea; Risk                 Factors:Current Smoker and Dyslipidemia. Polysubstance abuse.                 Cancer.  Sonographer:    Sheralyn Boatman RDCS Referring Phys: [id number] OLADAPO ADEFESO  Sonographer Comments: Technically difficult study due to poor echo windows, suboptimal parasternal window and patient is obese. Image acquisition challenging due to patient body habitus. IMPRESSIONS  1. Left ventricular ejection fraction, by estimation, is >75%. The left ventricle has hyperdynamic function. The left ventricle has no regional wall motion abnormalities. Left ventricular diastolic parameters are consistent with Grade II diastolic dysfunction (pseudonormalization).  2. Right ventricular systolic function is normal. The right ventricular size is mildly enlarged. There is severely elevated pulmonary artery systolic pressure. The estimated right ventricular systolic pressure is 62.3 mmHg.  3. Left atrial size was moderately dilated.  4. The mitral valve is  abnormal, moderately thickened and with mildly restricted motion. Moderate to severe mitral valve regurgitation. Cannot exclude vegetation in region of anterior leaflet (images 41, 44, 53). Suggest TEE for further evaluation  5. The aortic valve was not well visualized. Aortic valve regurgitation is not visualized. Aortic valve mean gradient measures 6.0 mmHg.  6. The inferior vena cava is dilated in size with <50% respiratory variability, suggesting right atrial pressure of 15 mmHg. Comparison(s): No prior Echocardiogram. FINDINGS  Left Ventricle: Left ventricular ejection fraction, by estimation, is >75%. The left ventricle has hyperdynamic function. The left ventricle has no regional wall motion abnormalities. The left ventricular internal cavity size was normal in size. There is no left ventricular hypertrophy. Left ventricular diastolic parameters are consistent with Grade II diastolic dysfunction (pseudonormalization). Right Ventricle: The right ventricular size is mildly enlarged. No increase in right ventricular wall thickness. Right ventricular systolic function is normal. There is severely elevated pulmonary artery systolic pressure. The tricuspid regurgitant velocity is 3.44 m/s, and with an assumed right atrial pressure of 15 mmHg, the estimated right ventricular systolic pressure is 62.3 mmHg. Left Atrium: Left atrial size was moderately dilated. Right Atrium: Right atrial size was normal in size. Pericardium: There is no evidence of pericardial effusion. Presence of epicardial fat layer. Mitral Valve: 41 44 53. The mitral valve is abnormal. There is moderate thickening of the mitral valve leaflet(s). Moderate to severe mitral valve regurgitation. MV peak gradient, 14.3 mmHg. The mean mitral valve gradient  is 5.0 mmHg. Tricuspid Valve: The tricuspid valve is grossly normal. Tricuspid valve regurgitation is mild. Aortic Valve: The aortic valve was not well visualized. Aortic valve regurgitation is not  visualized. Aortic valve mean gradient measures 6.0 mmHg. Aortic valve peak gradient measures 11.7 mmHg. Aortic valve area, by VTI measures 3.59 cm. Pulmonic Valve: The pulmonic valve was not well visualized. Pulmonic valve regurgitation is not visualized. Aorta: The aortic root is normal in size and structure. Venous: The inferior vena cava is dilated in size with less than 50% respiratory variability, suggesting right atrial pressure of 15 mmHg. IAS/Shunts: No atrial level shunt detected by color flow Doppler.  LEFT VENTRICLE PLAX 2D LVIDd:         4.90 cm   Diastology LVIDs:         3.50 cm   LV e' medial:  7.46 cm/s LV PW:         1.00 cm   LV e' lateral: 7.34 cm/s LV IVS:        0.80 cm LVOT diam:     2.20 cm LV SV:         124 LV SV Index:   62 LVOT Area:     3.80 cm  RIGHT VENTRICLE             IVC RV S prime:     14.80 cm/s  IVC diam: 2.70 cm TAPSE (M-mode): 3.0 cm LEFT ATRIUM             Index        RIGHT ATRIUM           Index LA diam:        3.10 cm 1.55 cm/m   RA Area:     14.80 cm LA Vol (A2C):   42.5 ml 21.29 ml/m  RA Volume:   33.00 ml  16.53 ml/m LA Vol (A4C):   98.0 ml 49.10 ml/m LA Biplane Vol: 70.4 ml 35.27 ml/m  AORTIC VALVE AV Area (Vmax):    3.51 cm AV Area (Vmean):   3.31 cm AV Area (VTI):     3.59 cm AV Vmax:           171.00 cm/s AV Vmean:          117.000 cm/s AV VTI:            0.345 m AV Peak Grad:      11.7 mmHg AV Mean Grad:      6.0 mmHg LVOT Vmax:         158.00 cm/s LVOT Vmean:        102.000 cm/s LVOT VTI:          0.326 m LVOT/AV VTI ratio: 0.94  AORTA Ao Root diam: 2.70 cm Ao Asc diam:  3.40 cm MITRAL VALVE                  TRICUSPID VALVE MV Peak grad: 14.3 mmHg       TR Peak grad:   47.3 mmHg MV Mean grad: 5.0 mmHg        TR Vmax:        344.00 cm/s MV Vmax:      1.89 m/s MV Vmean:     110.0 cm/s      SHUNTS MR Peak grad:    122.3 mmHg   Systemic VTI:  0.33 m MR Mean grad:    79.0 mmHg    Systemic Diam: 2.20 cm MR Vmax:  553.00 cm/s MR Vmean:        420.0 cm/s  MR PISA:         5.09 cm MR PISA Eff ROA: 35 mm MR PISA Radius:  0.90 cm Nona Dell MD Electronically signed by Nona Dell MD Signature Date/Time: 11/07/2022/11:05:31 AM    Final    [place] EKG SITE RITE  Result Date: 11/07/2022 If Site Rite image not attached, placement could not be confirmed due to current cardiac rhythm.  [place] EKG SITE RITE  Result Date: 11/07/2022 If Site Rite image not attached, placement could not be confirmed due to current cardiac rhythm.  DG Chest Portable 1 View  Result Date: 11/06/2022 CLINICAL DATA:  shortness of breath EXAM: PORTABLE CHEST 1 VIEW COMPARISON:  Chest x-ray 08/30/2015 FINDINGS: The heart and mediastinal contours are within normal limits. Nodular - like patchy airspace opacities within bilateral lower lung zones. Chronic coarsened interstitial markings with slightly more prominent interstitial markings. No pleural effusion. No pneumothorax. No acute osseous abnormality. IMPRESSION: 1. Likely mild pulmonary edema. 2. Nodular - like patchy airspace opacities within bilateral lower lung zones. Findings could represent infection/inflammation. Followup PA and lateral chest X-ray is recommended in 3-4 weeks following therapy to ensure resolution and exclude underlying malignancy. Electronically Signed   By: Tish Frederickson M.D.   On: 11/06/2022 18:25        Scheduled Meds:  amLODipine  10 mg Oral Daily   buprenorphine-naloxone  1 tablet Sublingual Daily   citalopram  40 mg Oral Daily   dextromethorphan-guaiFENesin  1 tablet Oral BID   enoxaparin (LOVENOX) injection  40 mg Subcutaneous Q24H   furosemide  40 mg Intravenous BID   ipratropium-albuterol  3 mL Nebulization Q6H   methylPREDNISolone (SOLU-MEDROL) injection  40 mg Intravenous Q12H   pantoprazole  40 mg Oral Daily   Continuous Infusions:  azithromycin     cefTRIAXone (ROCEPHIN)  IV     ferric gluconate (FERRLECIT) IVPB       LOS: 1 day    Time spent: 35 minutes    Payne Garske Hoover Brunette, DO [facility]  If 7PM-7AM, please contact night-coverage [email or web address] 11/07/2022, 11:11 AM

## 2022-11-07 NOTE — Progress Notes (Signed)
Pt's [id number] on 3L, turned down to 2L. Other vitals stable.

## 2022-11-07 NOTE — Progress Notes (Signed)
  Echocardiogram 2D Echocardiogram has been performed.  Janalyn Harder 11/07/2022, 8:57 AM

## 2022-11-07 NOTE — TOC Progression Note (Signed)
  Transition of Care [facility]) Screening Note   Patient Details  Name: [name] Date of Birth: [birth year]   Transition of Care [facility]) CM/SW Contact:    Leitha Bleak, RN Phone Number: 11/07/2022, 1:23 PM    Transition of Care Department [facility]) has reviewed patient and no TOC needs have been identified at this time. We will continue to monitor patient advancement through interdisciplinary progression rounds. If new patient transition needs arise, please place a TOC consult.     Expected Discharge Plan: Home/Self Care Barriers to Discharge: Continued Medical Work up  Expected Discharge Plan and Services    Social Determinants of Health (SDOH) Interventions SDOH Screenings   Tobacco Use: High Risk (11/06/2022)    Readmission Risk Interventions     No data to display

## 2022-11-07 NOTE — Progress Notes (Signed)
Peripherally Inserted Central Catheter Placement  The IV Nurse has discussed with the patient and/or persons authorized to consent for the patient, the purpose of this procedure and the potential benefits and risks involved with this procedure.  The benefits include less needle sticks, lab draws from the catheter, and the patient may be discharged home with the catheter. Risks include, but not limited to, infection, bleeding, blood clot (thrombus formation), and puncture of an artery; nerve damage and irregular heartbeat and possibility to perform a PICC exchange if needed/ordered by physician.  Alternatives to this procedure were also discussed.  Bard Power PICC patient education guide, fact sheet on infection prevention and patient information card has been provided to patient /or left at bedside.    PICC Placement Documentation  PICC Single Lumen 11/07/22 Right Basilic 39 cm 0 cm (Active)  Indication for Insertion or Continuance of Line Limited venous access - need for IV therapy >5 days (PICC only) 11/07/22 1834  Exposed Catheter (cm) 0 cm 11/07/22 1834  Site Assessment Clean, Dry, Intact 11/07/22 1834  Line Status Flushed;Blood return noted;Saline locked 11/07/22 1834  Dressing Type Transparent 11/07/22 1834  Dressing Status Antimicrobial disc in place 11/07/22 1834  Safety Lock Not Applicable 11/07/22 1834  Line Care Connections checked and tightened 11/07/22 1834  Line Adjustment (NICU/IV Team Only) No 11/07/22 1834  Dressing Intervention New dressing 11/07/22 1834  Dressing Change Due 11/14/22 11/07/22 1834       Audrie Gallus 11/07/2022, 6:36 PM

## 2022-11-08 DIAGNOSIS — J209 Acute bronchitis, unspecified: Secondary | ICD-10-CM | POA: Diagnosis not present

## 2022-11-08 DIAGNOSIS — Z720 Tobacco use: Secondary | ICD-10-CM | POA: Diagnosis not present

## 2022-11-08 DIAGNOSIS — J9601 Acute respiratory failure with hypoxia: Secondary | ICD-10-CM | POA: Diagnosis not present

## 2022-11-08 LAB — CBC
HCT: 29 % — ABNORMAL LOW (ref 36.0–46.0)
Hemoglobin: 8.4 g/dL — ABNORMAL LOW (ref 12.0–15.0)
MCH: 26.4 pg (ref 26.0–34.0)
MCHC: 29 g/dL — ABNORMAL LOW (ref 30.0–36.0)
MCV: 91.2 fL (ref 80.0–100.0)
Platelets: 366 10*3/uL (ref 150–400)
RBC: 3.18 MIL/uL — ABNORMAL LOW (ref 3.87–5.11)
RDW: 18 % — ABNORMAL HIGH (ref 11.5–15.5)
WBC: 11.8 10*3/uL — ABNORMAL HIGH (ref 4.0–10.5)
nRBC: 0.4 % — ABNORMAL HIGH (ref 0.0–0.2)

## 2022-11-08 LAB — BASIC METABOLIC PANEL
Anion gap: 13 (ref 5–15)
BUN: 15 mg/dL (ref 6–20)
CO2: 29 mmol/L (ref 22–32)
Calcium: 8.7 mg/dL — ABNORMAL LOW (ref 8.9–10.3)
Chloride: 94 mmol/L — ABNORMAL LOW (ref 98–111)
Creatinine, Ser: 0.82 mg/dL (ref 0.44–1.00)
GFR, Estimated: >60 mL/min (ref >60)
Glucose, Bld: 240 mg/dL — ABNORMAL HIGH (ref 70–99)
Potassium: 3.6 mmol/L (ref 3.5–5.1)
Sodium: 136 mmol/L (ref 135–145)

## 2022-11-08 LAB — MRSA NEXT GEN BY PCR, NASAL: MRSA by PCR Next Gen: NOT DETECTED

## 2022-11-08 LAB — MAGNESIUM: Magnesium: 2.2 mg/dL (ref 1.7–2.4)

## 2022-11-08 MED ORDER — ALPRAZOLAM 0.5 MG PO TABS
0.5000 mg | ORAL_TABLET | Freq: Two times a day (BID) | ORAL | Status: DC | PRN
  Administered 2022-11-08 – 2022-11-11 (×6): 0.5 mg via ORAL
  Filled 2022-11-08 (×6): qty 1

## 2022-11-08 MED ORDER — CALCIUM CARBONATE ANTACID 500 MG PO CHEW
800.0000 mg | CHEWABLE_TABLET | Freq: Once | ORAL | Status: AC
  Administered 2022-11-08: 800 mg via ORAL
  Filled 2022-11-08: qty 4

## 2022-11-08 MED ORDER — ENOXAPARIN SODIUM 60 MG/0.6ML IJ SOSY
50.0000 mg | PREFILLED_SYRINGE | INTRAMUSCULAR | Status: DC
  Administered 2022-11-08 – 2022-11-10 (×3): 50 mg via SUBCUTANEOUS
  Filled 2022-11-08 (×3): qty 0.6

## 2022-11-08 NOTE — Progress Notes (Signed)
[facility]  PROGRESS NOTE  [name] NGE:[id number] DOB: 02-19-1971 DOA: 11/06/2022 PCP: [facility]   Brief HPI:    52 y.o. female with medical history significant of hypertension, GERD, Celexa, tobacco abuse who presents to the emergency department due to 2-day onset of chest tightness, wheezing, nonproductive cough and increasing shortness of breath.  Patient was admitted with acute hypoxemic respiratory failure-multifactorial in the setting of new onset CHF, community-acquired pneumonia, and possible COPD exacerbation.  Patient noted to have moderate to severe MR on 2D echocardiogram.  Cardiology recommended TEE.   Subjective   Patient seen and examined, denies shortness of breath.   Assessment/Plan:    Acute hypoxemic respiratory failure -Multifactorial in setting of new onset CHF, acute bronchitis -Currently requiring 2 L/min of oxygen -Started on Lasix 40 mg IV twice daily; renal function is stable -Continue Solu-Medrol 40 mg IV every 12 hours, DuoNeb nebulizers every 6 hours scheduled  Hypertension -Continue amlodipine -Blood pressure is stable  Acute diastolic CHF -BNP was elevated to 921 -Echocardiogram showed EF 75% with grade 2 diastolic dysfunction -Continue Lasix 40 mg IV every 12 hours as above -Strict intake and output  Mitral valve abnormality -Echocardiogram shows possible thickening of anterior leaflet of mitral valve with moderate MR -TEE scheduled on Monday to rule out vegetation  Acute bronchitis/community-acquired pneumonia -Chest x-ray showed mild lower leg patchy airspace opacities in bilateral lower lung zones, could represent infection/inflammation -Started on antibiotics; continue ceftriaxone and Zithromax till 4/22 -Recommend to repeat chest x-ray in 3 to 4 weeks to ensure resolution and exclude malignancy  Normocytic anemia -Hemoglobin 7.8 -Serum iron 23, saturation 5% -Patient getting IV  iron -FOBT is pending -She will need EGD and colonoscopy as outpatient  Hypertension -Continue amlodipine  Depression -Continue Remeron, Celexa  GERD -Continue Protonix  Polysubstance dependence -Continue Suboxone  Morbid obesity -BMI 41 kg/m -Recommend diet and lifestyle modification   Medications     amLODipine  10 mg Oral Daily   buprenorphine-naloxone  1 tablet Sublingual BID   Chlorhexidine Gluconate Cloth  6 each Topical Daily   citalopram  40 mg Oral Daily   dextromethorphan-guaiFENesin  1 tablet Oral BID   enoxaparin (LOVENOX) injection  50 mg Subcutaneous Q24H   furosemide  40 mg Intravenous BID   ipratropium-albuterol  3 mL Nebulization Q6H   methylPREDNISolone (SOLU-MEDROL) injection  40 mg Intravenous Q12H   pantoprazole  40 mg Oral Daily   sodium chloride flush  10-40 mL Intracatheter Q12H     Data Reviewed:   CBG:  No results for input(s): "GLUCAP" in the last 168 hours.  SpO2: 97 % O2 Flow Rate (L/min): 2 L/min    Vitals:   11/08/22 0545 11/08/22 0801 11/08/22 1239 11/08/22 1336  BP: (!) 151/80  (!) 115/96   Pulse: 74  (!) 104   Resp: 16  20   Temp: 98.3 F (36.8 C)  98.7 F (37.1 C)   TempSrc: Oral  Oral   SpO2: 96% 96% 98% 97%  Weight:      Height:          Data Reviewed:  Basic Metabolic Panel: Recent Labs  Lab 11/06/22 1711 11/07/22 0503 11/08/22 0516  NA 139 137 136  K 4.4 4.0 3.6  CL 104 102 94*  CO2 GLUCOSE 154* 239* 240*  BUN CREATININE 0.93 0.89 0.82  CALCIUM 8.5* 8.2* 8.7*  MG  --  2.3 2.2  PHOS  --  3.2  --     CBC: Recent Labs  Lab 11/06/22 1711 11/07/22 0503 11/08/22 0516  WBC 8.9 7.6 11.8*  NEUTROABS 5.6  --   --   HGB 7.8* 7.7* 8.4*  HCT 27.1* 26.8* 29.0*  MCV 93.8 93.7 91.2  PLT 342 301 366    LFT Recent Labs  Lab 11/07/22 0503  AST 27  ALT 23  ALKPHOS 64  BILITOT 0.1*  PROT 7.3  ALBUMIN 3.1*     Antibiotics: Anti-infectives (From admission, onward)     Start     Dose/Rate Route Frequency Ordered Stop   11/07/22 2200  azithromycin (ZITHROMAX) 500 mg in sodium chloride 0.9 % 250 mL IVPB        500 mg 250 mL/hr over 60 Minutes Intravenous Every 24 hours 11/06/22 2054 11/11/22 2159   11/07/22 1700  cefTRIAXone (ROCEPHIN) injection 1 g        1 g Intramuscular  Once 11/07/22 1517 11/07/22 1721   11/07/22 1000  cefTRIAXone (ROCEPHIN) 2 g in sodium chloride 0.9 % 100 mL IVPB        2 g 200 mL/hr over 30 Minutes Intravenous Every 24 hours 11/06/22 2054 11/11/22 0959   11/06/22 1930  cefTRIAXone (ROCEPHIN) 1 g in sodium chloride 0.9 % 100 mL IVPB        1 g 200 mL/hr over 30 Minutes Intravenous  Once 11/06/22 1926 11/06/22 2217   11/06/22 1930  azithromycin (ZITHROMAX) 500 mg in sodium chloride 0.9 % 250 mL IVPB        500 mg 250 mL/hr over 60 Minutes Intravenous  Once 11/06/22 1926 11/06/22 2346        DVT prophylaxis: Lovenox  Code Status: Full code  Family Communication: No family at bedside   CONSULTS cardiology   Objective    Physical Examination:   General-appears in no acute distress Heart-S1-S2, regular, no murmur auscultated Lungs-clear to auscultation bilaterally Abdomen-soft, nontender, no organomegaly Extremities-no edema in the lower extremities Neuro-alert, oriented x3, no focal deficit noted  Status is: Inpatient:             [name]   [facility] If 7PM-7AM, please contact night-coverage at [email or web address], Office  [phone]   11/08/2022, 5:06 PM  LOS: 2 days

## 2022-11-08 NOTE — Progress Notes (Signed)
Pt reported feeling anxious during the night. MD notified, PRN Atarax given per MD order. Pt hypertensive, other vitals stable.

## 2022-11-08 NOTE — Progress Notes (Signed)
   [facility] has been requested to perform a transesophageal echocardiogram on [name] for mitral valve regurgitation and rule-out endocarditis.  After careful review of history and examination, the risks and benefits of transesophageal echocardiogram have been explained including risks of esophageal damage, perforation (1:10,000 risk), bleeding, pharyngeal hematoma as well as other potential complications associated with conscious sedation including aspiration, arrhythmia, respiratory failure and death. Alternatives to treatment were discussed, questions were answered. Patient is willing to proceed.   Scheduled for Monday (11/11/2022) at 1300. Keep NPO after midnight leading up to her procedure. Scheduled with anesthesia given she is on Suboxone at baseline.   Ellsworth Lennox, PA-C  11/08/2022 12:09 PM

## 2022-11-08 NOTE — H&P (View-Only) (Signed)
   [facility] has been requested to perform a transesophageal echocardiogram on [name] for mitral valve regurgitation and rule-out endocarditis.  After careful review of history and examination, the risks and benefits of transesophageal echocardiogram have been explained including risks of esophageal damage, perforation (1:10,000 risk), bleeding, pharyngeal hematoma as well as other potential complications associated with conscious sedation including aspiration, arrhythmia, respiratory failure and death. Alternatives to treatment were discussed, questions were answered. Patient is willing to proceed.   Scheduled for Monday (11/11/2022) at 1300. Keep NPO after midnight leading up to her procedure. Scheduled with anesthesia given she is on Suboxone at baseline.   Danna Casella M Drayke Grabel, PA-C  11/08/2022 12:09 PM   

## 2022-11-09 ENCOUNTER — Other Ambulatory Visit (HOSPITAL_COMMUNITY): Payer: Medicaid Other

## 2022-11-09 DIAGNOSIS — J9601 Acute respiratory failure with hypoxia: Secondary | ICD-10-CM | POA: Diagnosis not present

## 2022-11-09 DIAGNOSIS — Z720 Tobacco use: Secondary | ICD-10-CM | POA: Diagnosis not present

## 2022-11-09 DIAGNOSIS — J209 Acute bronchitis, unspecified: Secondary | ICD-10-CM | POA: Diagnosis not present

## 2022-11-09 LAB — BASIC METABOLIC PANEL
Anion gap: 11 (ref 5–15)
BUN: 19 mg/dL (ref 6–20)
CO2: 31 mmol/L (ref 22–32)
Calcium: 8.7 mg/dL — ABNORMAL LOW (ref 8.9–10.3)
Chloride: 91 mmol/L — ABNORMAL LOW (ref 98–111)
Creatinine, Ser: 1 mg/dL (ref 0.44–1.00)
GFR, Estimated: >60 mL/min (ref >60)
Glucose, Bld: 302 mg/dL — ABNORMAL HIGH (ref 70–99)
Potassium: 3.4 mmol/L — ABNORMAL LOW (ref 3.5–5.1)
Sodium: 133 mmol/L — ABNORMAL LOW (ref 135–145)

## 2022-11-09 LAB — GLUCOSE, CAPILLARY
Glucose-Capillary: 337 mg/dL — ABNORMAL HIGH (ref 70–99)
Glucose-Capillary: 318 mg/dL — ABNORMAL HIGH (ref 70–99)

## 2022-11-09 LAB — CULTURE, BLOOD (ROUTINE X 2): Culture: NO GROWTH

## 2022-11-09 MED ORDER — INSULIN ASPART 100 UNIT/ML IJ SOLN
0.0000 [IU] | Freq: Three times a day (TID) | INTRAMUSCULAR | Status: DC
  Administered 2022-11-09 – 2022-11-10 (×2): 7 [IU] via SUBCUTANEOUS
  Administered 2022-11-10: 1 [IU] via SUBCUTANEOUS

## 2022-11-09 MED ORDER — POTASSIUM CHLORIDE CRYS ER 20 MEQ PO TBCR
20.0000 meq | EXTENDED_RELEASE_TABLET | Freq: Once | ORAL | Status: AC
  Administered 2022-11-09: 20 meq via ORAL
  Filled 2022-11-09: qty 1

## 2022-11-09 MED ORDER — PREDNISONE 20 MG PO TABS
40.0000 mg | ORAL_TABLET | Freq: Every day | ORAL | Status: DC
  Administered 2022-11-10 – 2022-11-11 (×2): 40 mg via ORAL
  Filled 2022-11-09 (×2): qty 2

## 2022-11-09 MED ORDER — BENZONATATE 100 MG PO CAPS: 100.0000 mg | ORAL_CAPSULE | Freq: Three times a day (TID) | ORAL | Status: DC | PRN

## 2022-11-09 MED ORDER — IPRATROPIUM-ALBUTEROL 0.5-2.5 (3) MG/3ML IN SOLN
3.0000 mL | Freq: Three times a day (TID) | RESPIRATORY_TRACT | Status: DC
  Administered 2022-11-09 – 2022-11-11 (×7): 3 mL via RESPIRATORY_TRACT
  Filled 2022-11-09 (×8): qty 3

## 2022-11-09 NOTE — Progress Notes (Addendum)
[facility]  PROGRESS NOTE  [name]:[id number] DOB: [birth year] DOA: 11/06/2022 PCP: [facility]   Brief HPI:    52 y.o. female with medical history significant of hypertension, GERD, Celexa, tobacco abuse who presents to the emergency department due to 2-day onset of chest tightness, wheezing, nonproductive cough and increasing shortness of breath.  Patient was admitted with acute hypoxemic respiratory failure-multifactorial in the setting of new onset CHF, community-acquired pneumonia, and possible COPD exacerbation.  Patient noted to have moderate to severe MR on 2D echocardiogram.  Cardiology recommended TEE.   Subjective   Patient seen and examined, denies shortness of breath.   Assessment/Plan:    Acute hypoxemic respiratory failure -Multifactorial in setting of new onset CHF, acute bronchitis -Currently requiring 2 L/min of oxygen -Started on Lasix 40 mg IV twice daily; renal function is stable -Continue Solu-Medrol 40 mg IV every 12 hours, DuoNeb nebulizers every 6 hours scheduled  Hypertension -Continue amlodipine -Blood pressure is stable  Acute diastolic CHF -BNP was elevated to 921 -Echocardiogram showed EF 75% with grade 2 diastolic dysfunction -Continue Lasix 40 mg IV every 12 hours as above -Strict intake and output  Mitral valve abnormality -Echocardiogram shows possible thickening of anterior leaflet of mitral valve with moderate MR -TEE scheduled on Monday to rule out vegetation -Will keep n.p.o. after midnight on Sunday night  Acute bronchitis/community-acquired pneumonia -Chest x-ray showed mild lower leg patchy airspace opacities in bilateral lower lung zones, could represent infection/inflammation -Started on antibiotics; continue ceftriaxone and Zithromax till 4/22 -Recommend to repeat chest x-ray in 3 to 4 weeks to ensure resolution and exclude malignancy -Will discontinue IV Solu-Medrol and start  prednisone 40 mg daily from tomorrow morning  Hyperglycemia -CBG has been elevated, likely in setting of steroids as above -Check hemoglobin A1c -Start sliding scale insulin NovoLog -Steroids have been changed to p.o. prednisone from tomorrow morning  Normocytic anemia -Hemoglobin 7.8 -Serum iron 23, saturation 5% -Patient getting IV iron -FOBT is pending -She will need EGD and colonoscopy as outpatient  Hypertension -Continue amlodipine  Hypokalemia -Potassium is 3.4 -Replace potassium and follow BMP in am  Depression -Continue Remeron, Celexa  GERD -Continue Protonix  Polysubstance dependence -Continue Suboxone  Morbid obesity -BMI 41 kg/m -Recommend diet and lifestyle modification   Medications     amLODipine  10 mg Oral Daily   buprenorphine-naloxone  1 tablet Sublingual BID   Chlorhexidine Gluconate Cloth  6 each Topical Daily   citalopram  40 mg Oral Daily   enoxaparin (LOVENOX) injection  50 mg Subcutaneous Q24H   furosemide  40 mg Intravenous BID   ipratropium-albuterol  3 mL Nebulization TID   methylPREDNISolone (SOLU-MEDROL) injection  40 mg Intravenous Q12H   pantoprazole  40 mg Oral Daily   sodium chloride flush  10-40 mL Intracatheter Q12H     Data Reviewed:   CBG:  No results for input(s): "GLUCAP" in the last 168 hours.  SpO2: 92 % O2 Flow Rate (L/min): 2 L/min    Vitals:   11/09/22 0425 11/09/22 0758 11/09/22 1336 11/09/22 1352  BP: (!) 152/82  (!) 160/83   Pulse: 83  91   Resp: 20  19   Temp: 98.3 F (36.8 C)  97.9 F (36.6 C)   TempSrc: Oral     SpO2: 91% 90% 97% 92%  Weight: 99.3 kg     Height:          Data Reviewed:  Basic Metabolic Panel: Recent  Labs  Lab 11/06/22 1711 11/07/22 0503 11/08/22 0516 11/09/22 0634  NA 139 137 136 133*  K 4.4 4.0 3.6 3.4*  CL 104 102 94* 91*  CO2 GLUCOSE 154* 239* 240* 302*  BUN CREATININE 0.93 0.89 0.82 1.00  CALCIUM 8.5* 8.2* 8.7* 8.7*  MG  --   2.3 2.2  --   PHOS  --  3.2  --   --     CBC: Recent Labs  Lab 11/06/22 1711 11/07/22 0503 11/08/22 0516  WBC 8.9 7.6 11.8*  NEUTROABS 5.6  --   --   HGB 7.8* 7.7* 8.4*  HCT 27.1* 26.8* 29.0*  MCV 93.8 93.7 91.2  PLT 342 301 366    LFT Recent Labs  Lab 11/07/22 0503  AST 27  ALT 23  ALKPHOS 64  BILITOT 0.1*  PROT 7.3  ALBUMIN 3.1*     Antibiotics: Anti-infectives (From admission, onward)    Start     Dose/Rate Route Frequency Ordered Stop   11/07/22 2200  azithromycin (ZITHROMAX) 500 mg in sodium chloride 0.9 % 250 mL IVPB        500 mg 250 mL/hr over 60 Minutes Intravenous Every 24 hours 11/06/22 2054 11/11/22 2159   11/07/22 1700  cefTRIAXone (ROCEPHIN) injection 1 g        1 g Intramuscular  Once 11/07/22 1517 11/07/22 1721   11/07/22 1000  cefTRIAXone (ROCEPHIN) 2 g in sodium chloride 0.9 % 100 mL IVPB        2 g 200 mL/hr over 30 Minutes Intravenous Every 24 hours 11/06/22 2054 11/11/22 0959   11/06/22 1930  cefTRIAXone (ROCEPHIN) 1 g in sodium chloride 0.9 % 100 mL IVPB        1 g 200 mL/hr over 30 Minutes Intravenous  Once 11/06/22 1926 11/06/22 2217   11/06/22 1930  azithromycin (ZITHROMAX) 500 mg in sodium chloride 0.9 % 250 mL IVPB        500 mg 250 mL/hr over 60 Minutes Intravenous  Once 11/06/22 1926 11/06/22 2346        DVT prophylaxis: Lovenox  Code Status: Full code  Family Communication: No family at bedside   CONSULTS cardiology   Objective    Physical Examination:     Status is: Inpatient:             [name]   [facility] If 7PM-7AM, please contact night-coverage at [email or web address], Office  [phone]   11/09/2022, 1:57 PM  LOS: 3 days

## 2022-11-10 DIAGNOSIS — J9601 Acute respiratory failure with hypoxia: Secondary | ICD-10-CM | POA: Diagnosis not present

## 2022-11-10 DIAGNOSIS — J189 Pneumonia, unspecified organism: Secondary | ICD-10-CM | POA: Diagnosis not present

## 2022-11-10 DIAGNOSIS — J209 Acute bronchitis, unspecified: Secondary | ICD-10-CM | POA: Diagnosis not present

## 2022-11-10 LAB — GLUCOSE, CAPILLARY
Glucose-Capillary: 129 mg/dL — ABNORMAL HIGH (ref 70–99)
Glucose-Capillary: 108 mg/dL — ABNORMAL HIGH (ref 70–99)
Glucose-Capillary: 334 mg/dL — ABNORMAL HIGH (ref 70–99)
Glucose-Capillary: 315 mg/dL — ABNORMAL HIGH (ref 70–99)

## 2022-11-10 LAB — BASIC METABOLIC PANEL
Anion gap: 10 (ref 5–15)
BUN: 20 mg/dL (ref 6–20)
CO2: 32 mmol/L (ref 22–32)
Calcium: 8.7 mg/dL — ABNORMAL LOW (ref 8.9–10.3)
Chloride: 93 mmol/L — ABNORMAL LOW (ref 98–111)
Creatinine, Ser: 1.09 mg/dL — ABNORMAL HIGH (ref 0.44–1.00)
GFR, Estimated: >60 mL/min (ref >60)
Glucose, Bld: 159 mg/dL — ABNORMAL HIGH (ref 70–99)
Potassium: 2.9 mmol/L — ABNORMAL LOW (ref 3.5–5.1)
Sodium: 135 mmol/L (ref 135–145)

## 2022-11-10 LAB — CULTURE, BLOOD (ROUTINE X 2): Culture: NO GROWTH

## 2022-11-10 MED ORDER — FUROSEMIDE 40 MG PO TABS
40.0000 mg | ORAL_TABLET | Freq: Every day | ORAL | Status: DC
  Administered 2022-11-11: 40 mg via ORAL
  Filled 2022-11-10: qty 2

## 2022-11-10 MED ORDER — POTASSIUM CHLORIDE 10 MEQ/100ML IV SOLN
10.0000 meq | INTRAVENOUS | Status: AC
  Administered 2022-11-10 (×4): 10 meq via INTRAVENOUS
  Filled 2022-11-10 (×3): qty 100

## 2022-11-10 MED ORDER — ALUM & MAG HYDROXIDE-SIMETH 200-200-20 MG/5ML PO SUSP
30.0000 mL | Freq: Four times a day (QID) | ORAL | Status: DC | PRN
  Administered 2022-11-10: 30 mL via ORAL
  Filled 2022-11-10: qty 30

## 2022-11-10 MED ORDER — WITCH HAZEL-GLYCERIN EX PADS: MEDICATED_PAD | CUTANEOUS | Status: DC | PRN

## 2022-11-10 NOTE — Progress Notes (Signed)
[facility]  PROGRESS NOTE  [name] GNF:[id number] DOB: 05/12/1971 DOA: 11/06/2022 PCP: [name]   Brief HPI:    52 y.o. female with medical history significant of hypertension, GERD, [name], tobacco abuse who presents to [facility] emergency department due to 2-day onset of chest tightness, wheezing, nonproductive cough and increasing shortness of breath.  Patient was admitted with acute hypoxemic respiratory failure-multifactorial in [facility] setting of new onset CHF, community-acquired pneumonia, and possible COPD exacerbation.  Patient noted to have moderate to severe MR on 2D echocardiogram.  Cardiology recommended TEE.   Subjective   Patient seen and examined, denies shortness of breath.  Overall   Assessment/Plan:    Acute hypoxemic respiratory failure -Multifactorial in setting of new onset CHF, acute bronchitis -Currently requiring 2 L/min of oxygen -Started on Lasix 40 mg IV twice daily; renal function slightly getting worse -Will discontinue IV Lasix and changed to p.o. Lasix 40 mg daily -Started on  Solu-Medrol 40 mg IV every 12 hours, which has been changed to prednisone 40 mg daily  -DuoNeb nebulizers every 6 hours scheduled  Hypertension -Continue amlodipine -Blood pressure is stable  Acute diastolic CHF -BNP was elevated to 921 -Echocardiogram showed EF 75% with grade 2 diastolic dysfunction -Started on Lasix 40 mg IV every 12 hours as above -Will change to Lasix 40 mg p.o. daily   Mitral valve abnormality -Echocardiogram shows possible thickening of anterior leaflet of mitral valve with moderate MR -TEE scheduled on Monday to rule out vegetation -Will keep n.p.o. after midnight on Sunday night  Acute bronchitis/community-acquired pneumonia -Chest x-ray showed mild lower leg patchy airspace opacities in bilateral lower lung zones, could represent infection/inflammation -Started on antibiotics; continue ceftriaxone and  Zithromax till 4/22 -Recommend to repeat chest x-ray in 3 to 4 weeks to ensure resolution and exclude malignancy IV Solu-Medrol has been discontinued, started on prednisone 40 mg daily  Hyperglycemia -CBG has been elevated, likely in setting of steroids as above -Check hemoglobin A1c -Start sliding scale insulin NovoLog -Steroids have been changed to p.o. prednisone from tomorrow morning  Normocytic anemia -Hemoglobin 7.8 -Serum iron 23, saturation 5% -Patient getting IV iron -FOBT is pending -She will need EGD and colonoscopy as outpatient  Hypertension -Continue amlodipine  Hypokalemia -Potassium is 2.9 today -Replace potassium and follow BMP in am  Depression -Continue [name], [name]  GERD -Continue Protonix  Polysubstance dependence -Continue [name]  Morbid obesity -BMI 41 kg/m -Recommend diet and lifestyle modification   Medications     amLODipine  10 mg Oral Daily   buprenorphine-naloxone  1 tablet Sublingual BID   Chlorhexidine Gluconate Cloth  6 each Topical Daily   citalopram  40 mg Oral Daily   enoxaparin (LOVENOX) injection  50 mg Subcutaneous Q24H   [START ON 11/11/2022] furosemide  40 mg Oral Daily   insulin aspart  0-9 Units Subcutaneous TID WC   ipratropium-albuterol  3 mL Nebulization TID   pantoprazole  40 mg Oral Daily   predniSONE  40 mg Oral Q breakfast   sodium chloride flush  10-40 mL Intracatheter Q12H     Data Reviewed:   CBG:  Recent Labs  Lab 11/09/22 1744 11/09/22 2050 11/10/22 0757 11/10/22 1139  GLUCAP 337* 318* 129* 108*    SpO2: 100 % O2 Flow Rate (L/min): 2 L/min    Vitals:   11/09/22 2100 11/10/22 0405 11/10/22 0835 11/10/22 1014  BP: (!) 142/81   112/87  Pulse: 86   73  Resp:  18  Temp: 98 F (36.7 C)   (!) 97.5 F (36.4 C)  TempSrc: Oral   Oral  SpO2: 96%  93% 100%  Weight:  101 kg    Height:          Data Reviewed:  Basic Metabolic Panel: Recent Labs  Lab 11/06/22 1711 11/07/22 0503  11/08/22 0516 11/09/22 0634 11/10/22 0630  NA 139 137 136 133* 135  K 4.4 4.0 3.6 3.4* 2.9*  CL 104 102 94* 91* 93*  CO2 32  GLUCOSE 154* 239* 240* 302* 159*  BUN CREATININE 0.93 0.89 0.82 1.00 1.09*  CALCIUM 8.5* 8.2* 8.7* 8.7* 8.7*  MG  --  2.3 2.2  --   --   PHOS  --  3.2  --   --   --     CBC: Recent Labs  Lab 11/06/22 1711 11/07/22 0503 11/08/22 0516  WBC 8.9 7.6 11.8*  NEUTROABS 5.6  --   --   HGB 7.8* 7.7* 8.4*  HCT 27.1* 26.8* 29.0*  MCV 93.8 93.7 91.2  PLT 342 301 366    LFT Recent Labs  Lab 11/07/22 0503  AST 27  ALT 23  ALKPHOS 64  BILITOT 0.1*  PROT 7.3  ALBUMIN 3.1*     Antibiotics: Anti-infectives (From admission, onward)    Start     Dose/Rate Route Frequency Ordered Stop   11/07/22 2200  azithromycin (ZITHROMAX) 500 mg in sodium chloride 0.9 % 250 mL IVPB        500 mg 250 mL/hr over 60 Minutes Intravenous Every 24 hours 11/06/22 2054 11/11/22 2159   11/07/22 1700  cefTRIAXone (ROCEPHIN) injection 1 g        1 g Intramuscular  Once 11/07/22 1517 11/07/22 1721   11/07/22 1000  cefTRIAXone (ROCEPHIN) 2 g in sodium chloride 0.9 % 100 mL IVPB        2 g 200 mL/hr over 30 Minutes Intravenous Every 24 hours 11/06/22 2054 11/11/22 0959   11/06/22 1930  cefTRIAXone (ROCEPHIN) 1 g in sodium chloride 0.9 % 100 mL IVPB        1 g 200 mL/hr over 30 Minutes Intravenous  Once 11/06/22 1926 11/06/22 2217   11/06/22 1930  azithromycin (ZITHROMAX) 500 mg in sodium chloride 0.9 % 250 mL IVPB        500 mg 250 mL/hr over 60 Minutes Intravenous  Once 11/06/22 1926 11/06/22 2346        DVT prophylaxis: Lovenox  Code Status: Full code  Family Communication: No family at bedside   CONSULTS cardiology   Objective    Physical Examination:  General-appears in no acute distress Heart-S1-S2, regular, no murmur auscultated Lungs-clear to auscultation bilaterally, no wheezing or crackles auscultated Abdomen-soft,  nontender, no organomegaly Extremities-no edema in Jennifer lower extremities Neuro-alert, oriented x3, no focal deficit noted   Status is: Inpatient:             [name]   [name] If 7PM-7AM, please contact night-coverage at [email or web address], Office  [phone]   11/10/2022, 2:12 PM  LOS: 4 days

## 2022-11-11 ENCOUNTER — Inpatient Hospital Stay (HOSPITAL_COMMUNITY): Payer: Medicaid Other | Admitting: Anesthesiology

## 2022-11-11 ENCOUNTER — Encounter (HOSPITAL_COMMUNITY)
Admission: EM | Disposition: A | Discharge status: Home / Self Care | Payer: Self-pay | Source: Home / Self Care | Attending: Family Medicine

## 2022-11-11 ENCOUNTER — Inpatient Hospital Stay (HOSPITAL_COMMUNITY): Payer: Medicaid Other

## 2022-11-11 DIAGNOSIS — F418 Other specified anxiety disorders: Secondary | ICD-10-CM

## 2022-11-11 DIAGNOSIS — I340 Nonrheumatic mitral (valve) insufficiency: Secondary | ICD-10-CM

## 2022-11-11 DIAGNOSIS — F17210 Nicotine dependence, cigarettes, uncomplicated: Secondary | ICD-10-CM

## 2022-11-11 DIAGNOSIS — I10 Essential (primary) hypertension: Secondary | ICD-10-CM

## 2022-11-11 DIAGNOSIS — J9601 Acute respiratory failure with hypoxia: Secondary | ICD-10-CM | POA: Diagnosis not present

## 2022-11-11 HISTORY — PX: TEE WITHOUT CARDIOVERSION: SHX5443

## 2022-11-11 LAB — BASIC METABOLIC PANEL
Anion gap: 8 (ref 5–15)
BUN: 25 mg/dL — ABNORMAL HIGH (ref 6–20)
CO2: 33 mmol/L — ABNORMAL HIGH (ref 22–32)
Calcium: 8.9 mg/dL (ref 8.9–10.3)
Chloride: 97 mmol/L — ABNORMAL LOW (ref 98–111)
Creatinine, Ser: 0.9 mg/dL (ref 0.44–1.00)
GFR, Estimated: >60 mL/min (ref >60)
Glucose, Bld: 107 mg/dL — ABNORMAL HIGH (ref 70–99)
Potassium: 3.8 mmol/L (ref 3.5–5.1)
Sodium: 138 mmol/L (ref 135–145)

## 2022-11-11 LAB — GLUCOSE, CAPILLARY
Glucose-Capillary: 71 mg/dL (ref 70–99)
Glucose-Capillary: 108 mg/dL — ABNORMAL HIGH (ref 70–99)
Glucose-Capillary: 183 mg/dL — ABNORMAL HIGH (ref 70–99)

## 2022-11-11 LAB — LEGIONELLA PNEUMOPHILA SEROGP 1 UR AG: L. pneumophila Serogp 1 Ur Ag: NEGATIVE

## 2022-11-11 LAB — CULTURE, BLOOD (ROUTINE X 2): Special Requests: ADEQUATE

## 2022-11-11 LAB — ECHO TEE

## 2022-11-11 LAB — HEMOGLOBIN A1C
Hgb A1c MFr Bld: 6.2 % — ABNORMAL HIGH (ref 4.8–5.6)
Mean Plasma Glucose: 131 mg/dL

## 2022-11-11 SURGERY — ECHOCARDIOGRAM, TRANSESOPHAGEAL
Anesthesia: General

## 2022-11-11 MED ORDER — METOCLOPRAMIDE HCL 5 MG/ML IJ SOLN
INTRAMUSCULAR | Status: DC | PRN
  Administered 2022-11-11: 10 mg via INTRAVENOUS

## 2022-11-11 MED ORDER — PREDNISONE 20 MG PO TABS: 40.0000 mg | ORAL_TABLET | Freq: Every day | ORAL | 0 refills | Status: AC

## 2022-11-11 MED ORDER — PROPOFOL 10 MG/ML IV BOLUS
INTRAVENOUS | Status: DC | PRN
  Administered 2022-11-11: 20 mg via INTRAVENOUS

## 2022-11-11 MED ORDER — BUTAMBEN-TETRACAINE-BENZOCAINE 2-2-14 % EX AERO
INHALATION_SPRAY | CUTANEOUS | Status: AC
  Filled 2022-11-11: qty 5

## 2022-11-11 MED ORDER — SODIUM CHLORIDE 0.9 % IV SOLN: INTRAVENOUS | Status: DC | PRN

## 2022-11-11 MED ORDER — MIDAZOLAM HCL 2 MG/2ML IJ SOLN
INTRAMUSCULAR | Status: AC
  Filled 2022-11-11: qty 2

## 2022-11-11 MED ORDER — MIDAZOLAM HCL 5 MG/5ML IJ SOLN
INTRAMUSCULAR | Status: DC | PRN
  Administered 2022-11-11: 2 mg via INTRAVENOUS

## 2022-11-11 MED ORDER — DIPHENHYDRAMINE HCL 25 MG PO CAPS
25.0000 mg | ORAL_CAPSULE | Freq: Once | ORAL | Status: AC
  Administered 2022-11-11: 25 mg via ORAL
  Filled 2022-11-11: qty 1

## 2022-11-11 MED ORDER — POTASSIUM CHLORIDE CRYS ER 10 MEQ PO TBCR: 10.0000 meq | EXTENDED_RELEASE_TABLET | Freq: Two times a day (BID) | ORAL | 0 refills | Status: DC

## 2022-11-11 MED ORDER — GUAIFENESIN-DM 100-10 MG/5ML PO SYRP: 5.0000 mL | ORAL_SOLUTION | ORAL | 0 refills | Status: DC | PRN

## 2022-11-11 MED ORDER — BUTAMBEN-TETRACAINE-BENZOCAINE 2-2-14 % EX AERO
INHALATION_SPRAY | CUTANEOUS | Status: DC | PRN
  Administered 2022-11-11: 2 via TOPICAL

## 2022-11-11 MED ORDER — IPRATROPIUM-ALBUTEROL 0.5-2.5 (3) MG/3ML IN SOLN
RESPIRATORY_TRACT | Status: AC
  Filled 2022-11-11: qty 3

## 2022-11-11 MED ORDER — PROPOFOL 500 MG/50ML IV EMUL
INTRAVENOUS | Status: DC | PRN
  Administered 2022-11-11: 75 ug/kg/min via INTRAVENOUS

## 2022-11-11 MED ORDER — FUROSEMIDE 40 MG PO TABS: 40.0000 mg | ORAL_TABLET | Freq: Every day | ORAL | 0 refills | Status: DC

## 2022-11-11 MED ORDER — COMBIVENT RESPIMAT 20-100 MCG/ACT IN AERS: 1.0000 | INHALATION_SPRAY | Freq: Four times a day (QID) | RESPIRATORY_TRACT | 1 refills | Status: AC | PRN

## 2022-11-11 MED ORDER — METOCLOPRAMIDE HCL 5 MG/ML IJ SOLN
INTRAMUSCULAR | Status: AC
  Filled 2022-11-11: qty 2

## 2022-11-11 MED ORDER — PROPOFOL 500 MG/50ML IV EMUL
INTRAVENOUS | Status: AC
  Filled 2022-11-11: qty 50

## 2022-11-11 NOTE — Anesthesia Postprocedure Evaluation (Signed)
Anesthesia Post Note  Patient: [name]  Procedure(s) Performed: TRANSESOPHAGEAL ECHOCARDIOGRAM (TEE)  Patient location during evaluation: Phase II Anesthesia Type: General Level of consciousness: awake and alert and oriented Pain management: pain level controlled Vital Signs Assessment: post-procedure vital signs reviewed and stable Respiratory status: spontaneous breathing, nonlabored ventilation and respiratory function stable Cardiovascular status: blood pressure returned to baseline and stable Postop Assessment: no apparent nausea or vomiting Anesthetic complications: no  No notable events documented.   Last Vitals:  Vitals:   11/11/22 1515 11/11/22 1536  BP: 124/69 (!) 144/70  Pulse: 71 79  Resp: 14   Temp:  (!) 36.4 C  SpO2: 94% 94%    Last Pain:  Vitals:   11/11/22 1536  TempSrc: Oral  PainSc:                  Alani Sabbagh C Soraya Paquette

## 2022-11-11 NOTE — Interval H&P Note (Signed)
History and Physical Interval Note:  11/11/2022 2:25 PM  [name]  has presented today for TEE, with the diagnosis of mitral valve regurgatation.  The various methods of treatment have been discussed with the patient and family. After consideration of risks, benefits and other options for treatment, the patient has consented to  Procedure(s): TRANSESOPHAGEAL ECHOCARDIOGRAM (TEE) (N/A) as a surgical intervention.  The patient's history has been reviewed, patient examined, no change in status, stable for surgery.  I have reviewed the patient's chart and labs.  Questions were answered to the patient's satisfaction.     Nakaya Mishkin P Taksh Hjort

## 2022-11-11 NOTE — Transfer of Care (Signed)
Immediate Anesthesia Transfer of Care Note  Patient: [name]  Procedure(s) Performed: TRANSESOPHAGEAL ECHOCARDIOGRAM (TEE)  Patient Location: PACU  Anesthesia Type:General  Level of Consciousness: awake  Airway & Oxygen Therapy: Patient Spontanous Breathing  Post-op Assessment: Report given to RN  Post vital signs: Reviewed and stable  Last Vitals:  Vitals Value Taken Time  BP    Temp    Pulse 79 11/11/22 1425  Resp 8 11/11/22 1425  SpO2 99 % 11/11/22 1425  Vitals shown include unvalidated device data.  Last Pain:  Vitals:   11/11/22 0508  TempSrc: Oral  PainSc:          Complications: No notable events documented.

## 2022-11-11 NOTE — Progress Notes (Signed)
PICC removed and gauze and transparent dressing place.  No bleeding note.  DC instructions reviewed and scripts sent to pharmacy.  Waiting on ride home

## 2022-11-11 NOTE — Discharge Summary (Signed)
Physician Discharge Summary  [name] DOB: [birth year] DOA: 11/06/2022  PCP: [facility]  Admit date: 11/06/2022  Discharge date: 11/11/2022  Admitted From:Home  Disposition:  Home  Recommendations for Outpatient Follow-up:  Follow up with PCP in 1-2 weeks Referral to cardiology to follow-up for mitral regurgitation/CHF Continue Lasix and potassium as prescribed Continue prednisone for 3 more days as prescribed as well as Combivent as needed for shortness of breath or wheezing Patient referred to GI for outpatient EGD/colonoscopy given anemia She has completed course of antibiotics during the course of the stay Continue other home medications as noted below  Home Health:None  Equipment/Devices:None  Discharge Condition:Stable  CODE STATUS: Full  Diet recommendation: Heart Healthy  Brief/Interim Summary:  52 y.o. female with medical history significant of hypertension, GERD, Celexa, tobacco abuse who presents to the emergency department due to 2-day onset of chest tightness, wheezing, nonproductive cough and increasing shortness of breath.  Patient was admitted with acute hypoxemic respiratory failure-multifactorial in the setting of new onset CHF, community-acquired pneumonia, and possible COPD exacerbation.  Patient noted to have moderate to severe MR on 2D echocardiogram.  Cardiology recommended TEE which she underwent on 4/22 with moderate MR noted.  She will need to follow-up outpatient with cardiology for above as well as with GI to assess for anemia due to iron deficiency for which she received some IV iron.  No overt bleeding noted and hemoglobin levels have remained stable.  Her respiratory status is much improved and she is now in stable condition for discharge with no other acute events noted.  Discharge Diagnoses:  Principal Problem:   Acute respiratory failure with hypoxia Active Problems:   Tobacco abuse    Depression   GERD   Polysubstance dependence including opioid type drug, episodic abuse (HCC)   Elevated brain natriuretic peptide (BNP) level   CAP (community acquired pneumonia)   Acute bronchitis   Nonrheumatic mitral valve regurgitation  Principal discharge diagnosis: Acute hypoxemic respiratory failure-multifactorial in the setting of new onset acute diastolic CHF as well as bronchitis.  Moderate MR noted on TEE.  Discharge Instructions  Discharge Instructions     Ambulatory referral to Cardiology   Complete by: As directed    Diet - low sodium heart healthy   Complete by: As directed    Increase activity slowly   Complete by: As directed       Allergies as of 11/11/2022       Reactions   Tramadol Hcl Anaphylaxis, Hives, Itching, Swelling   Of tongue   Ketorolac Nausea And Vomiting, Rash, Other (See Comments)   Propoxyphene Rash        Medication List     TAKE these medications    amLODipine 10 MG tablet Commonly known as: NORVASC Take 1 tablet (10 mg total) by mouth daily.   aspirin EC 81 MG tablet Take 81 mg by mouth 2 (two) times a week.   Buprenorphine HCl-Naloxone HCl 8-2 MG Film Place under the tongue 2 (two) times daily.   citalopram 40 MG tablet Commonly known as: CELEXA Take 40 mg by mouth daily.   Combivent Respimat 20-100 MCG/ACT Aers respimat Generic drug: Ipratropium-Albuterol Inhale 1 puff into the lungs every 6 (six) hours as needed for wheezing or shortness of breath.   furosemide 40 MG tablet Commonly known as: LASIX Take 1 tablet (40 mg total) by mouth daily. Start taking on: November 12, 2022   GOODYS EXTRA STRENGTH PO Take  1 Package by mouth daily as needed (pain).   guaiFENesin-dextromethorphan 100-10 MG/5ML syrup Commonly known as: ROBITUSSIN DM Take 5 mLs by mouth every 4 (four) hours as needed for cough.   ibuprofen 200 MG tablet Commonly known as: ADVIL Take 800 mg by mouth every 6 (six) hours as needed for moderate  pain.   metoprolol tartrate 50 MG tablet Commonly known as: LOPRESSOR Take 50 mg by mouth 2 (two) times daily.   mirtazapine 15 MG tablet Commonly known as: REMERON Take 15 mg by mouth at bedtime as needed (depression).   naproxen 375 MG tablet Commonly known as: NAPROSYN Take 375 mg by mouth 2 (two) times daily as needed for moderate pain.   omeprazole 20 MG capsule Commonly known as: PRILOSEC Take 40 mg by mouth daily.   potassium chloride 10 MEQ tablet Commonly known as: KLOR-CON M Take 1 tablet (10 mEq total) by mouth 2 (two) times daily.   predniSONE 20 MG tablet Commonly known as: DELTASONE Take 2 tablets (40 mg total) by mouth daily with breakfast for 3 days. Start taking on: November 12, 2022        Follow-up Information     [facility] Schedule an appointment as soon as possible for a visit in 1 week(s).   Contact information: [address] [place] [address] [phone]         [facility]. Go to.   Specialty: Cardiology Contact information: [address] [place] [address] [phone]               Allergies  Allergen Reactions   Tramadol Hcl Anaphylaxis, Hives, Itching and Swelling    Of tongue   Ketorolac Nausea And Vomiting, Rash and Other (See Comments)   Propoxyphene Rash    Consultations: None   Procedures/Studies: ECHO TEE  Result Date: 11/11/2022    TRANSESOPHOGEAL ECHO REPORT   Patient Name:   [name] [name] Date of Exam: 11/11/2022 Medical Rec #:  [id number]           Height:       62.0 in Accession #:    [id number]          Weight:       224.0 lb Date of Birth:  04-25-1971           BSA:          2.006 m Patient Age:    51 years            BP:           134/82 mmHg Patient Gender: F                   HR:           90 bpm. [facility] Procedure: Transesophageal Echo, Cardiac Doppler and Color Doppler Indications:    Mitral valve disease [[id number]]   History:        Patient has prior history of Echocardiogram examinations, most                 recent 11/07/2022. Signs/Symptoms:Dyspnea and Shortness of                 Breath; Risk Factors:Current Smoker and Dyslipidemia.                 Polysubstance abuse. Cancer.  Sonographer:    Celesta Gentile RCS Referring Phys: [id number] Ellsworth Lennox PROCEDURE: The  transesophogeal probe was passed without difficulty through the esophogus of the patient. Imaged were obtained with the patient in a left lateral decubitus position. Sedation performed by different physician. The patient was monitored while under deep sedation. Image quality was good. The patient's vital signs; including heart rate, blood pressure, and oxygen saturation; remained stable throughout the procedure. Supplementary images were obtained from transthoracic windows as indicated to answer the clinical question. The patient developed no complications during the procedure.  IMPRESSIONS  1. Left ventricular ejection fraction, by estimation, is 60 to 65%. The left ventricle has normal function. Left ventricular diastolic function could not be evaluated.  2. The mitral valve is normal in structure with normal leaflet mobility. Mild to moderate mitral valve regurgitation with no evidence of systolic pulmonary vein flow reversal. No mitral stenosis. No echodensity on the mitral valve.  3. The aortic valve is tricuspid. Aortic valve regurgitation is not visualized. No aortic stenosis is present.  4. Left atrial size was moderately dilated. No left atrial/left atrial appendage thrombus was detected. The LAA emptying velocity was 87 cm/s. FINDINGS  Left Ventricle: Left ventricular ejection fraction, by estimation, is 60 to 65%. The left ventricle has normal function. The left ventricular internal cavity size was normal in size. There is no left ventricular hypertrophy. Left ventricular diastolic function could not be evaluated. Right Ventricle: The right  ventricular size is not well visualized. Right vetricular wall thickness was not assessed. Right ventricular systolic function was not well visualized. Left Atrium: Left atrial size was moderately dilated. No left atrial/left atrial appendage thrombus was detected. The LAA emptying velocity was 87 cm/s. Right Atrium: Right atrial size was normal in size. Pericardium: There is no evidence of pericardial effusion. Mitral Valve: No echodensity on the mitral valve. The mitral valve is normal in structure. Normal mobility of the mitral valve leaflets. Mild to moderate mitral valve regurgitation. No evidence of mitral valve stenosis. Tricuspid Valve: The tricuspid valve is normal in structure. Tricuspid valve regurgitation is trivial. No evidence of tricuspid stenosis. Aortic Valve: The aortic valve is tricuspid. Aortic valve regurgitation is not visualized. No aortic stenosis is present. Pulmonic Valve: The pulmonic valve was grossly normal. Pulmonic valve regurgitation is trivial. No evidence of pulmonic stenosis. Aorta: Aortic root could not be assessed. Venous: A normal flow pattern is recorded from the left upper pulmonary vein, the left lower pulmonary vein and the right upper pulmonary vein. The inferior vena cava was not well visualized. IAS/Shunts: No atrial level shunt detected by color flow Doppler. Additional Comments: Spectral Doppler performed. Vishnu Priya Mallipeddi Electronically signed by Winfield Rast Mallipeddi Signature Date/Time: 11/11/2022/3:50:19 PM    Final    ECHOCARDIOGRAM COMPLETE  Result Date: 11/07/2022    ECHOCARDIOGRAM REPORT   Patient Name:   [name] [name] Garber Date of Exam: 11/07/2022 Medical Rec #:  [id number]           Height:       62.0 in Accession #:    [id number]          Weight:       221.3 lb Date of Birth:  08-05-1970           BSA:          1.996 m Patient Age:    51 years            BP:           126/84 mmHg Patient Gender: F  HR:           67 bpm. Exam  [facility] Procedure: 2D Echo, Cardiac Doppler and Color Doppler Indications:    I50.40* Unspecified combined systolic (congestive) and diastolic                 (congestive) heart failure  History:        Patient has no prior history of Echocardiogram examinations.                 Signs/Symptoms:Shortness of Breath and Dyspnea; Risk                 Factors:Current Smoker and Dyslipidemia. Polysubstance abuse.                 Cancer.  Sonographer:    Sheralyn Boatman RDCS Referring Phys: [id number] OLADAPO ADEFESO  Sonographer Comments: Technically difficult study due to poor echo windows, suboptimal parasternal window and patient is obese. Image acquisition challenging due to patient body habitus. IMPRESSIONS  1. Left ventricular ejection fraction, by estimation, is >75%. The left ventricle has hyperdynamic function. The left ventricle has no regional wall motion abnormalities. Left ventricular diastolic parameters are consistent with Grade II diastolic dysfunction (pseudonormalization).  2. Right ventricular systolic function is normal. The right ventricular size is mildly enlarged. There is severely elevated pulmonary artery systolic pressure. The estimated right ventricular systolic pressure is 62.3 mmHg.  3. Left atrial size was moderately dilated.  4. The mitral valve is abnormal, moderately thickened and with mildly restricted motion. Moderate to severe mitral valve regurgitation. Cannot exclude vegetation in region of anterior leaflet (images 41, 44, 53). Suggest TEE for further evaluation  5. The aortic valve was not well visualized. Aortic valve regurgitation is not visualized. Aortic valve mean gradient measures 6.0 mmHg.  6. The inferior vena cava is dilated in size with <50% respiratory variability, suggesting right atrial pressure of 15 mmHg. Comparison(s): No prior Echocardiogram. FINDINGS  Left Ventricle: Left ventricular ejection fraction, by estimation, is >75%. The left ventricle has hyperdynamic  function. The left ventricle has no regional wall motion abnormalities. The left ventricular internal cavity size was normal in size. There is no left ventricular hypertrophy. Left ventricular diastolic parameters are consistent with Grade II diastolic dysfunction (pseudonormalization). Right Ventricle: The right ventricular size is mildly enlarged. No increase in right ventricular wall thickness. Right ventricular systolic function is normal. There is severely elevated pulmonary artery systolic pressure. The tricuspid regurgitant velocity is 3.44 m/s, and with an assumed right atrial pressure of 15 mmHg, the estimated right ventricular systolic pressure is 62.3 mmHg. Left Atrium: Left atrial size was moderately dilated. Right Atrium: Right atrial size was normal in size. Pericardium: There is no evidence of pericardial effusion. Presence of epicardial fat layer. Mitral Valve: 41 44 53. The mitral valve is abnormal. There is moderate thickening of the mitral valve leaflet(s). Moderate to severe mitral valve regurgitation. MV peak gradient, 14.3 mmHg. The mean mitral valve gradient is 5.0 mmHg. Tricuspid Valve: The tricuspid valve is grossly normal. Tricuspid valve regurgitation is mild. Aortic Valve: The aortic valve was not well visualized. Aortic valve regurgitation is not visualized. Aortic valve mean gradient measures 6.0 mmHg. Aortic valve peak gradient measures 11.7 mmHg. Aortic valve area, by VTI measures 3.59 cm. Pulmonic Valve: The pulmonic valve was not well visualized. Pulmonic valve regurgitation is not visualized. Aorta: The aortic root is normal in size and structure. Venous: The inferior vena cava is dilated in size  with less than 50% respiratory variability, suggesting right atrial pressure of 15 mmHg. IAS/Shunts: No atrial level shunt detected by color flow Doppler.  LEFT VENTRICLE PLAX 2D LVIDd:         4.90 cm   Diastology LVIDs:         3.50 cm   LV e' medial:  7.46 cm/s LV PW:         1.00 cm    LV e' lateral: 7.34 cm/s LV IVS:        0.80 cm LVOT diam:     2.20 cm LV SV:         124 LV SV Index:   62 LVOT Area:     3.80 cm  RIGHT VENTRICLE             IVC RV S prime:     14.80 cm/s  IVC diam: 2.70 cm TAPSE (M-mode): 3.0 cm LEFT ATRIUM             Index        RIGHT ATRIUM           Index LA diam:        3.10 cm 1.55 cm/m   RA Area:     14.80 cm LA Vol (A2C):   42.5 ml 21.29 ml/m  RA Volume:   33.00 ml  16.53 ml/m LA Vol (A4C):   98.0 ml 49.10 ml/m LA Biplane Vol: 70.4 ml 35.27 ml/m  AORTIC VALVE AV Area (Vmax):    3.51 cm AV Area (Vmean):   3.31 cm AV Area (VTI):     3.59 cm AV Vmax:           171.00 cm/s AV Vmean:          117.000 cm/s AV VTI:            0.345 m AV Peak Grad:      11.7 mmHg AV Mean Grad:      6.0 mmHg LVOT Vmax:         158.00 cm/s LVOT Vmean:        102.000 cm/s LVOT VTI:          0.326 m LVOT/AV VTI ratio: 0.94  AORTA Ao Root diam: 2.70 cm Ao Asc diam:  3.40 cm MITRAL VALVE                  TRICUSPID VALVE MV Peak grad: 14.3 mmHg       TR Peak grad:   47.3 mmHg MV Mean grad: 5.0 mmHg        TR Vmax:        344.00 cm/s MV Vmax:      1.89 m/s MV Vmean:     110.0 cm/s      SHUNTS MR Peak grad:    122.3 mmHg   Systemic VTI:  0.33 m MR Mean grad:    79.0 mmHg    Systemic Diam: 2.20 cm MR Vmax:         553.00 cm/s MR Vmean:        420.0 cm/s MR PISA:         5.09 cm MR PISA Eff ROA: 35 mm MR PISA Radius:  0.90 cm Nona Dell MD Electronically signed by Nona Dell MD Signature Date/Time: 11/07/2022/11:05:31 AM    Final    [place] EKG SITE RITE  Result Date: 11/07/2022 If Site Rite image not attached, placement could not be confirmed due to current cardiac rhythm.  [place] EKG SITE RITE  Result Date: 11/07/2022 If Site Rite image not attached, placement could not be confirmed due to current cardiac rhythm.  DG Chest Portable 1 View  Result Date: 11/06/2022 CLINICAL DATA:  shortness of breath EXAM: PORTABLE CHEST 1 VIEW COMPARISON:  Chest x-ray 08/30/2015 FINDINGS: The  heart and mediastinal contours are within normal limits. Nodular - like patchy airspace opacities within bilateral lower lung zones. Chronic coarsened interstitial markings with slightly more prominent interstitial markings. No pleural effusion. No pneumothorax. No acute osseous abnormality. IMPRESSION: 1. Likely mild pulmonary edema. 2. Nodular - like patchy airspace opacities within bilateral lower lung zones. Findings could represent infection/inflammation. Followup PA and lateral chest X-ray is recommended in 3-4 weeks following therapy to ensure resolution and exclude underlying malignancy. Electronically Signed   By: Tish Frederickson M.D.   On: 11/06/2022 18:25     Discharge Exam: Vitals:   11/11/22 1515 11/11/22 1536  BP: 124/69 (!) 144/70  Pulse: 71 79  Resp: 14   Temp:  (!) 97.5 F (36.4 C)  SpO2: 94% 94%   Vitals:   11/11/22 1445 11/11/22 1500 11/11/22 1515 11/11/22 1536  BP: 135/65 127/76 124/69 (!) 144/70  Pulse: 72 78 71 79  Resp: 19 12 14    Temp:    (!) 97.5 F (36.4 C)  TempSrc:    Oral  SpO2: 98% 97% 94% 94%  Weight:      Height:        General: Pt is alert, awake, not in acute distress Cardiovascular: RRR, S1/S2 +, no rubs, no gallops Respiratory: CTA bilaterally, no wheezing, no rhonchi Abdominal: Soft, NT, ND, bowel sounds + Extremities: no edema, no cyanosis    The results of significant diagnostics from this hospitalization (including imaging, microbiology, ancillary and laboratory) are listed below for reference.     Microbiology: Recent Results (from the past 240 hour(s))  SARS Coronavirus 2 by RT PCR (hospital order, performed in [facility] hospital lab) *cepheid single result test* Anterior Nasal Swab     Status: None   Collection Time: 11/06/22  7:33 PM   Specimen: Anterior Nasal Swab  Result Value Ref Range Status   SARS Coronavirus 2 by RT PCR NEGATIVE NEGATIVE Final    Comment: (NOTE) SARS-CoV-2 target nucleic acids are NOT DETECTED.  The  SARS-CoV-2 RNA is generally detectable in upper and lower respiratory specimens during the acute phase of infection. The lowest concentration of SARS-CoV-2 viral copies this assay can detect is 250 copies / mL. A negative result does not preclude SARS-CoV-2 infection and should not be used as the sole basis for treatment or other patient management decisions.  A negative result may occur with improper specimen collection / handling, submission of specimen other than nasopharyngeal swab, presence of viral mutation(s) within the areas targeted by this assay, and inadequate number of viral copies (<250 copies / mL). A negative result must be combined with clinical observations, patient history, and epidemiological information.  Fact Sheet for Patients:   RoadLapTop.co.za  Fact Sheet for Healthcare Providers: http://kim-miller.com/  This test is not yet approved or  cleared by the [place] FDA and has been authorized for detection and/or diagnosis of SARS-CoV-2 by FDA under an Emergency Use Authorization (EUA).  This EUA will remain in effect (meaning this test can be used) for the duration of the COVID-19 declaration under Section 564(b)(1) of the Act, 21 U.S.C. section 360bbb-3(b)(1), unless the authorization is terminated or revoked sooner.  Performed at [facility]  [facility], [address] [place], [place] [address]   Culture, blood (routine x 2) Call MD if unable to obtain prior to antibiotics being given     Status: None   Collection Time: 11/06/22  9:14 PM   Specimen: BLOOD  Result Value Ref Range Status   Specimen Description BLOOD BLOOD RIGHT ARM  Final   Special Requests   Final    BOTTLES DRAWN AEROBIC AND ANAEROBIC Blood Culture adequate volume   Culture   Final    NO GROWTH 5 DAYS Performed at [facility], [address], [place], [place] [address]    Report Status 11/11/2022 FINAL  Final  Culture, blood (routine x 2) Call MD  if unable to obtain prior to antibiotics being given     Status: None   Collection Time: 11/06/22 10:04 PM   Specimen: BLOOD RIGHT ARM  Result Value Ref Range Status   Specimen Description BLOOD RIGHT ARM  Final   Special Requests   Final    BOTTLES DRAWN AEROBIC AND ANAEROBIC Blood Culture results may not be optimal due to an excessive volume of blood received in culture bottles   Culture   Final    NO GROWTH 5 DAYS Performed at [facility], [address] [place], [place] [address]    Report Status 11/11/2022 FINAL  Final  MRSA Next Gen by PCR, Nasal     Status: None   Collection Time: 11/08/22  9:47 AM   Specimen: Nasal Mucosa; Nasal Swab  Result Value Ref Range Status   MRSA by PCR Next Gen NOT DETECTED NOT DETECTED Final    Comment: (NOTE) The GeneXpert MRSA Assay (FDA approved for NASAL specimens only), is one component of a comprehensive MRSA colonization surveillance program. It is not intended to diagnose MRSA infection nor to guide or monitor treatment for MRSA infections. Test performance is not FDA approved in patients less than 36 years old. Performed at [facility], [address], [place], [place] [address]      Labs: BNP (last 3 results) Recent Labs    11/06/22 1711  BNP 921.0*   Basic Metabolic Panel: Recent Labs  Lab 11/07/22 0503 11/08/22 0516 11/09/22 0634 11/10/22 0630 11/11/22 0421  NA 137 136 133* 135 138  K 4.0 3.6 3.4* 2.9* 3.8  CL 102 94* 91* 93* 97*  CO2 32 33*  GLUCOSE 239* 240* 302* 159* 107*  BUN 25*  CREATININE 0.89 0.82 1.00 1.09* 0.90  CALCIUM 8.2* 8.7* 8.7* 8.7* 8.9  MG 2.3 2.2  --   --   --   PHOS 3.2  --   --   --   --    Liver Function Tests: Recent Labs  Lab 11/07/22 0503  AST 27  ALT 23  ALKPHOS 64  BILITOT 0.1*  PROT 7.3  ALBUMIN 3.1*   No results for input(s): "LIPASE", "AMYLASE" in the last 168 hours. No results for input(s): "AMMONIA" in the last 168 hours. CBC: Recent Labs  Lab  11/06/22 1711 11/07/22 0503 11/08/22 0516  WBC 8.9 7.6 11.8*  NEUTROABS 5.6  --   --   HGB 7.8* 7.7* 8.4*  HCT 27.1* 26.8* 29.0*  MCV 93.8 93.7 91.2  PLT 342 301 366   Cardiac Enzymes: No results for input(s): "CKTOTAL", "CKMB", "CKMBINDEX", "TROPONINI" in the last 168 hours. BNP: Invalid input(s): "POCBNP" CBG: Recent Labs  Lab 11/10/22 1645 11/10/22 2127 11/11/22 0738 11/11/22 1104 11/11/22 1537  GLUCAP  334* 315* 71 108* 183*   D-Dimer No results for input(s): "DDIMER" in the last 72 hours. Hgb A1c Recent Labs    11/09/22 1402  HGBA1C 6.2*   Lipid Profile No results for input(s): "CHOL", "HDL", "LDLCALC", "TRIG", "CHOLHDL", "LDLDIRECT" in the last 72 hours. Thyroid function studies No results for input(s): "TSH", "T4TOTAL", "T3FREE", "THYROIDAB" in the last 72 hours.  Invalid input(s): "FREET3" Anemia work up No results for input(s): "VITAMINB12", "FOLATE", "FERRITIN", "TIBC", "IRON", "RETICCTPCT" in the last 72 hours. Urinalysis    Component Value Date/Time   COLORURINE YELLOW 10/02/2017 1326   APPEARANCEUR CLOUDY (A) 10/02/2017 1326   LABSPEC 1.019 10/02/2017 1326   PHURINE 6.0 10/02/2017 1326   GLUCOSEU NEGATIVE 10/02/2017 1326   HGBUR NEGATIVE 10/02/2017 1326   BILIRUBINUR NEGATIVE 10/02/2017 1326   KETONESUR NEGATIVE 10/02/2017 1326   PROTEINUR 30 (A) 10/02/2017 1326   UROBILINOGEN 0.2 09/02/2013 1619   NITRITE NEGATIVE 10/02/2017 1326   LEUKOCYTESUR TRACE (A) 10/02/2017 1326   Sepsis Labs Recent Labs  Lab 11/06/22 1711 11/07/22 0503 11/08/22 0516  WBC 8.9 7.6 11.8*   Microbiology Recent Results (from the past 240 hour(s))  SARS Coronavirus 2 by RT PCR (hospital order, performed in [facility] hospital lab) *cepheid single result test* Anterior Nasal Swab     Status: None   Collection Time: 11/06/22  7:33 PM   Specimen: Anterior Nasal Swab  Result Value Ref Range Status   SARS Coronavirus 2 by RT PCR NEGATIVE NEGATIVE Final    Comment:  (NOTE) SARS-CoV-2 target nucleic acids are NOT DETECTED.  The SARS-CoV-2 RNA is generally detectable in upper and lower respiratory specimens during the acute phase of infection. The lowest concentration of SARS-CoV-2 viral copies this assay can detect is 250 copies / mL. A negative result does not preclude SARS-CoV-2 infection and should not be used as the sole basis for treatment or other patient management decisions.  A negative result may occur with improper specimen collection / handling, submission of specimen other than nasopharyngeal swab, presence of viral mutation(s) within the areas targeted by this assay, and inadequate number of viral copies (<250 copies / mL). A negative result must be combined with clinical observations, patient history, and epidemiological information.  Fact Sheet for Patients:   RoadLapTop.co.za  Fact Sheet for Healthcare Providers: http://kim-miller.com/  This test is not yet approved or  cleared by the [place] FDA and has been authorized for detection and/or diagnosis of SARS-CoV-2 by FDA under an Emergency Use Authorization (EUA).  This EUA will remain in effect (meaning this test can be used) for the duration of the COVID-19 declaration under Section 564(b)(1) of the Act, 21 U.S.C. section 360bbb-3(b)(1), unless the authorization is terminated or revoked sooner.  Performed at [facility], [address], [place], [place] [address]   Culture, blood (routine x 2) Call MD if unable to obtain prior to antibiotics being given     Status: None   Collection Time: 11/06/22  9:14 PM   Specimen: BLOOD  Result Value Ref Range Status   Specimen Description BLOOD BLOOD RIGHT ARM  Final   Special Requests   Final    BOTTLES DRAWN AEROBIC AND ANAEROBIC Blood Culture adequate volume   Culture   Final    NO GROWTH 5 DAYS Performed at [facility], [address], [place], [place] [address]    Report Status  11/11/2022 FINAL  Final  Culture, blood (routine x 2) Call MD if unable to obtain prior to  antibiotics being given     Status: None   Collection Time: 11/06/22 10:04 PM   Specimen: BLOOD RIGHT ARM  Result Value Ref Range Status   Specimen Description BLOOD RIGHT ARM  Final   Special Requests   Final    BOTTLES DRAWN AEROBIC AND ANAEROBIC Blood Culture results may not be optimal due to an excessive volume of blood received in culture bottles   Culture   Final    NO GROWTH 5 DAYS Performed at [facility], [address], [place], [place] [address]    Report Status 11/11/2022 FINAL  Final  MRSA Next Gen by PCR, Nasal     Status: None   Collection Time: 11/08/22  9:47 AM   Specimen: Nasal Mucosa; Nasal Swab  Result Value Ref Range Status   MRSA by PCR Next Gen NOT DETECTED NOT DETECTED Final    Comment: (NOTE) The GeneXpert MRSA Assay (FDA approved for NASAL specimens only), is one component of a comprehensive MRSA colonization surveillance program. It is not intended to diagnose MRSA infection nor to guide or monitor treatment for MRSA infections. Test performance is not FDA approved in patients less than 88 years old. Performed at [facility], [address] [place], [place] [address]      Time coordinating discharge: 35 minutes  SIGNED:   Erick Blinks, DO [facility] 11/11/2022, 4:19 PM  If 7PM-7AM, please contact night-coverage [email or web address]

## 2022-11-11 NOTE — CV Procedure (Signed)
   TRANSESOPHAGEAL ECHOCARDIOGRAM   NAME:  [name]    MRN: [id number] DOB:  Feb 08, 1971    ADMIT DATE: 11/06/2022  INDICATIONS: Mitral valve regurgitation severity and to r/o vegetation/echodensity  PROCEDURE:  Informed consent was obtained prior to the procedure. After a procedural time-out, the oropharynx was anesthetized and the patient was sedated by the anesthesia service. The transesophageal probe was inserted in the esophagus and stomach without difficulty and multiple views were obtained.  There were no apparent complications. The patient had normal neuro status and respiratory status post procedure with vitals stable as recorded elsewhere.  Adequate airway was maintained throughout and vital signs monitored per protocol.  FINDINGS TEE showed no evidence of echodensity on the mitral valve and has mild to moderate MR. There is no systolic flow reversal on pulmonary vein doppler. Follow up on official TEE report.  Gwenevere [place] Spurr, MD [facility]  2:25 PM

## 2022-11-11 NOTE — Progress Notes (Signed)
*  PRELIMINARY RESULTS* Echocardiogram Echocardiogram Transesophageal has been performed.  [name] 11/11/2022, 2:25 PM

## 2022-11-11 NOTE — Anesthesia Preprocedure Evaluation (Addendum)
Anesthesia Evaluation  Patient identified by MRN, date of birth, ID band Patient awake    Reviewed: Allergy & Precautions, H&P , NPO status , Patient's Chart, lab work & pertinent test results  Airway Mallampati: II  TM Distance: >3 FB Neck ROM: Full    Dental  (+) Edentulous Upper, Edentulous Lower   Pulmonary pneumonia, Current Smoker   Pulmonary exam normal breath sounds clear to auscultation       Cardiovascular hypertension, Pt. on medications Normal cardiovascular exam Rhythm:Regular Rate:Normal  1. Left ventricular ejection fraction, by estimation, is >75%. The left  ventricle has hyperdynamic function. The left ventricle has no regional  wall motion abnormalities. Left ventricular diastolic parameters are  consistent with Grade II diastolic  dysfunction (pseudonormalization).   2. Right ventricular systolic function is normal. The right ventricular  size is mildly enlarged. There is severely elevated pulmonary artery  systolic pressure. The estimated right ventricular systolic pressure is  62.3 mmHg.   3. Left atrial size was moderately dilated.   4. The mitral valve is abnormal, moderately thickened and with mildly  restricted motion. Moderate to severe mitral valve regurgitation. Cannot  exclude vegetation in region of anterior leaflet (images 41, 44, 53).  Suggest TEE for further evaluation   5. The aortic valve was not well visualized. Aortic valve regurgitation  is not visualized. Aortic valve mean gradient measures 6.0 mmHg.   6. The inferior vena cava is dilated in size with <50% respiratory  variability, suggesting right atrial pressure of 15 mmHg.   Comparison(s): No prior Echocardiogram.     Neuro/Psych  PSYCHIATRIC DISORDERS Anxiety Depression     Neuromuscular disease    GI/Hepatic PUD,GERD  Medicated,,(+)     substance abuse  cocaine use and marijuana use, Hepatitis -  Endo/Other  negative  endocrine ROS    Renal/GU Renal disease  Female GU complaint (cervical cancer)     Musculoskeletal  (+) Arthritis , Osteoarthritis,  narcotic dependent  Abdominal   Peds negative pediatric ROS (+)  Hematology  (+) Blood dyscrasia, anemia   Anesthesia Other Findings Patient on oxygen 3L, saturation in low 90s Room air saturations in 80s  Reproductive/Obstetrics negative OB ROS                             Anesthesia Physical Anesthesia Plan  ASA: 4  Anesthesia Plan: General   Post-op Pain Management: Minimal or no pain anticipated   Induction: Intravenous  PONV Risk Score and Plan: Propofol infusion  Airway Management Planned: Nasal Cannula, Natural Airway and Simple Face Mask  Additional Equipment:   Intra-op Plan:   Post-operative Plan:   Informed Consent: I have reviewed the patients History and Physical, chart, labs and discussed the procedure including the risks, benefits and alternatives for the proposed anesthesia with the patient or authorized representative who has indicated his/her understanding and acceptance.     Dental advisory given  Plan Discussed with: CRNA and Surgeon  Anesthesia Plan Comments: (Will give duoneb treatments before the procedure)       Anesthesia Quick Evaluation

## 2022-11-11 NOTE — Anesthesia Procedure Notes (Signed)
Date/Time: 11/11/2022 1:40 PM  Performed by: Moshe Salisbury, CRNAPre-anesthesia Checklist: Patient identified, Emergency Drugs available, Suction available, Timeout performed and Patient being monitored Patient Re-evaluated:Patient Re-evaluated prior to induction Oxygen Delivery Method: Non-rebreather mask

## 2022-11-13 ENCOUNTER — Encounter (HOSPITAL_COMMUNITY): Payer: Self-pay | Admitting: Internal Medicine

## 2022-11-18 ENCOUNTER — Emergency Department (HOSPITAL_COMMUNITY): Payer: 59

## 2022-11-18 ENCOUNTER — Ambulatory Visit: Payer: Medicaid Other | Attending: Internal Medicine | Admitting: Internal Medicine

## 2022-11-18 ENCOUNTER — Encounter: Payer: Self-pay | Admitting: Internal Medicine

## 2022-11-18 ENCOUNTER — Encounter (HOSPITAL_COMMUNITY): Payer: Self-pay | Admitting: Emergency Medicine

## 2022-11-18 ENCOUNTER — Observation Stay (HOSPITAL_COMMUNITY)
Admission: EM | Admit: 2022-11-18 | Discharge: 2022-11-21 | Disposition: A | Discharge status: Home / Self Care | Payer: 59 | Source: Ambulatory Visit | Attending: Family Medicine | Admitting: Family Medicine

## 2022-11-18 ENCOUNTER — Other Ambulatory Visit: Payer: Self-pay

## 2022-11-18 VITALS — BP 122/68 | HR 77 | Ht 62.0 in | Wt 229.0 lb

## 2022-11-18 DIAGNOSIS — F1920 Other psychoactive substance dependence, uncomplicated: Secondary | ICD-10-CM | POA: Diagnosis present

## 2022-11-18 DIAGNOSIS — D649 Anemia, unspecified: Secondary | ICD-10-CM | POA: Diagnosis not present

## 2022-11-18 DIAGNOSIS — M549 Dorsalgia, unspecified: Secondary | ICD-10-CM | POA: Diagnosis present

## 2022-11-18 DIAGNOSIS — I509 Heart failure, unspecified: Secondary | ICD-10-CM | POA: Diagnosis not present

## 2022-11-18 DIAGNOSIS — Z8541 Personal history of malignant neoplasm of cervix uteri: Secondary | ICD-10-CM

## 2022-11-18 DIAGNOSIS — F32A Depression, unspecified: Secondary | ICD-10-CM | POA: Diagnosis not present

## 2022-11-18 DIAGNOSIS — I5032 Chronic diastolic (congestive) heart failure: Principal | ICD-10-CM | POA: Insufficient documentation

## 2022-11-18 DIAGNOSIS — D500 Iron deficiency anemia secondary to blood loss (chronic): Secondary | ICD-10-CM | POA: Diagnosis not present

## 2022-11-18 DIAGNOSIS — Z79899 Other long term (current) drug therapy: Secondary | ICD-10-CM | POA: Diagnosis not present

## 2022-11-18 DIAGNOSIS — F419 Anxiety disorder, unspecified: Secondary | ICD-10-CM | POA: Diagnosis present

## 2022-11-18 DIAGNOSIS — D509 Iron deficiency anemia, unspecified: Secondary | ICD-10-CM | POA: Diagnosis not present

## 2022-11-18 DIAGNOSIS — Z6838 Body mass index (BMI) 38.0-38.9, adult: Secondary | ICD-10-CM

## 2022-11-18 DIAGNOSIS — I5033 Acute on chronic diastolic (congestive) heart failure: Secondary | ICD-10-CM

## 2022-11-18 DIAGNOSIS — G8929 Other chronic pain: Secondary | ICD-10-CM | POA: Diagnosis present

## 2022-11-18 DIAGNOSIS — R001 Bradycardia, unspecified: Secondary | ICD-10-CM | POA: Diagnosis not present

## 2022-11-18 DIAGNOSIS — I110 Hypertensive heart disease with heart failure: Secondary | ICD-10-CM | POA: Diagnosis not present

## 2022-11-18 DIAGNOSIS — I340 Nonrheumatic mitral (valve) insufficiency: Secondary | ICD-10-CM | POA: Diagnosis present

## 2022-11-18 DIAGNOSIS — Z7982 Long term (current) use of aspirin: Secondary | ICD-10-CM | POA: Insufficient documentation

## 2022-11-18 DIAGNOSIS — K3184 Gastroparesis: Secondary | ICD-10-CM | POA: Diagnosis present

## 2022-11-18 DIAGNOSIS — Z833 Family history of diabetes mellitus: Secondary | ICD-10-CM

## 2022-11-18 DIAGNOSIS — Z1152 Encounter for screening for COVID-19: Secondary | ICD-10-CM | POA: Diagnosis not present

## 2022-11-18 DIAGNOSIS — J9601 Acute respiratory failure with hypoxia: Secondary | ICD-10-CM | POA: Diagnosis not present

## 2022-11-18 DIAGNOSIS — K219 Gastro-esophageal reflux disease without esophagitis: Secondary | ICD-10-CM | POA: Insufficient documentation

## 2022-11-18 DIAGNOSIS — B1920 Unspecified viral hepatitis C without hepatic coma: Secondary | ICD-10-CM | POA: Diagnosis present

## 2022-11-18 DIAGNOSIS — I272 Pulmonary hypertension, unspecified: Secondary | ICD-10-CM | POA: Diagnosis present

## 2022-11-18 DIAGNOSIS — Z83438 Family history of other disorder of lipoprotein metabolism and other lipidemia: Secondary | ICD-10-CM

## 2022-11-18 DIAGNOSIS — R0602 Shortness of breath: Secondary | ICD-10-CM | POA: Diagnosis present

## 2022-11-18 DIAGNOSIS — Z23 Encounter for immunization: Secondary | ICD-10-CM | POA: Diagnosis not present

## 2022-11-18 DIAGNOSIS — F1120 Opioid dependence, uncomplicated: Secondary | ICD-10-CM | POA: Diagnosis not present

## 2022-11-18 DIAGNOSIS — Z888 Allergy status to other drugs, medicaments and biological substances status: Secondary | ICD-10-CM

## 2022-11-18 DIAGNOSIS — Z8701 Personal history of pneumonia (recurrent): Secondary | ICD-10-CM

## 2022-11-18 DIAGNOSIS — Z885 Allergy status to narcotic agent status: Secondary | ICD-10-CM

## 2022-11-18 DIAGNOSIS — E669 Obesity, unspecified: Secondary | ICD-10-CM | POA: Diagnosis present

## 2022-11-18 DIAGNOSIS — Z8249 Family history of ischemic heart disease and other diseases of the circulatory system: Secondary | ICD-10-CM

## 2022-11-18 DIAGNOSIS — Z560 Unemployment, unspecified: Secondary | ICD-10-CM

## 2022-11-18 DIAGNOSIS — F17210 Nicotine dependence, cigarettes, uncomplicated: Secondary | ICD-10-CM | POA: Diagnosis present

## 2022-11-18 LAB — CBC WITH DIFFERENTIAL/PLATELET
Abs Immature Granulocytes: 0.06 10*3/uL (ref 0.00–0.07)
Basophils Absolute: 0 10*3/uL (ref 0.0–0.1)
Basophils Relative: 0 %
Eosinophils Absolute: 0.2 10*3/uL (ref 0.0–0.5)
Eosinophils Relative: 3 %
HCT: 31.3 % — ABNORMAL LOW (ref 36.0–46.0)
Hemoglobin: 9 g/dL — ABNORMAL LOW (ref 12.0–15.0)
Immature Granulocytes: 1 %
Lymphocytes Relative: 30 %
Lymphs Abs: 2.4 10*3/uL (ref 0.7–4.0)
MCH: 26.1 pg (ref 26.0–34.0)
MCHC: 28.8 g/dL — ABNORMAL LOW (ref 30.0–36.0)
MCV: 90.7 fL (ref 80.0–100.0)
Monocytes Absolute: 0.6 10*3/uL (ref 0.1–1.0)
Monocytes Relative: 7 %
Neutro Abs: 4.7 10*3/uL (ref 1.7–7.7)
Neutrophils Relative %: 59 %
Platelets: 275 10*3/uL (ref 150–400)
RBC: 3.45 MIL/uL — ABNORMAL LOW (ref 3.87–5.11)
RDW: 19.8 % — ABNORMAL HIGH (ref 11.5–15.5)
WBC: 8 10*3/uL (ref 4.0–10.5)
nRBC: 0.6 % — ABNORMAL HIGH (ref 0.0–0.2)

## 2022-11-18 LAB — BRAIN NATRIURETIC PEPTIDE: B Natriuretic Peptide: 544 pg/mL — ABNORMAL HIGH (ref 0.0–100.0)

## 2022-11-18 LAB — COMPREHENSIVE METABOLIC PANEL
ALT: 37 U/L (ref 0–44)
AST: 39 U/L (ref 15–41)
Albumin: 3.3 g/dL — ABNORMAL LOW (ref 3.5–5.0)
Alkaline Phosphatase: 57 U/L (ref 38–126)
Anion gap: 10 (ref 5–15)
BUN: 17 mg/dL (ref 6–20)
CO2: 31 mmol/L (ref 22–32)
Calcium: 8.7 mg/dL — ABNORMAL LOW (ref 8.9–10.3)
Chloride: 96 mmol/L — ABNORMAL LOW (ref 98–111)
Creatinine, Ser: 0.98 mg/dL (ref 0.44–1.00)
GFR, Estimated: >60 mL/min (ref >60)
Glucose, Bld: 121 mg/dL — ABNORMAL HIGH (ref 70–99)
Potassium: 4.2 mmol/L (ref 3.5–5.1)
Sodium: 137 mmol/L (ref 135–145)
Total Bilirubin: 0.7 mg/dL (ref 0.3–1.2)
Total Protein: 6.9 g/dL (ref 6.5–8.1)

## 2022-11-18 LAB — SARS CORONAVIRUS 2 BY RT PCR: SARS Coronavirus 2 by RT PCR: NEGATIVE

## 2022-11-18 LAB — TROPONIN I (HIGH SENSITIVITY)
Troponin I (High Sensitivity): 6 ng/L (ref <18)
Troponin I (High Sensitivity): 5 ng/L (ref <18)

## 2022-11-18 MED ORDER — PANTOPRAZOLE SODIUM 40 MG PO TBEC
40.0000 mg | DELAYED_RELEASE_TABLET | Freq: Every day | ORAL | Status: DC
  Administered 2022-11-19 – 2022-11-21 (×3): 40 mg via ORAL
  Filled 2022-11-18 (×3): qty 1

## 2022-11-18 MED ORDER — MIRTAZAPINE 15 MG PO TABS
15.0000 mg | ORAL_TABLET | Freq: Every evening | ORAL | Status: DC | PRN
  Administered 2022-11-18 – 2022-11-20 (×2): 15 mg via ORAL
  Filled 2022-11-18 (×3): qty 1

## 2022-11-18 MED ORDER — ACETAMINOPHEN 325 MG PO TABS: 650.0000 mg | ORAL_TABLET | Freq: Four times a day (QID) | ORAL | Status: DC | PRN

## 2022-11-18 MED ORDER — POLYSACCHARIDE IRON COMPLEX 150 MG PO CAPS
150.0000 mg | ORAL_CAPSULE | Freq: Every day | ORAL | Status: DC
  Administered 2022-11-18 – 2022-11-21 (×4): 150 mg via ORAL
  Filled 2022-11-18 (×4): qty 1

## 2022-11-18 MED ORDER — OXYCODONE HCL 5 MG PO TABS
5.0000 mg | ORAL_TABLET | ORAL | Status: DC | PRN
  Administered 2022-11-18: 5 mg via ORAL
  Filled 2022-11-18: qty 1

## 2022-11-18 MED ORDER — PNEUMOCOCCAL 20-VAL CONJ VACC 0.5 ML IM SUSY
0.5000 mL | PREFILLED_SYRINGE | INTRAMUSCULAR | Status: AC
  Administered 2022-11-19: 0.5 mL via INTRAMUSCULAR
  Filled 2022-11-18: qty 0.5

## 2022-11-18 MED ORDER — ASPIRIN 81 MG PO TBEC
81.0000 mg | DELAYED_RELEASE_TABLET | ORAL | Status: DC
  Administered 2022-11-21: 81 mg via ORAL
  Filled 2022-11-18: qty 1

## 2022-11-18 MED ORDER — ALBUTEROL SULFATE (2.5 MG/3ML) 0.083% IN NEBU: 2.5000 mg | INHALATION_SOLUTION | RESPIRATORY_TRACT | Status: DC | PRN

## 2022-11-18 MED ORDER — METOPROLOL TARTRATE 50 MG PO TABS
50.0000 mg | ORAL_TABLET | Freq: Two times a day (BID) | ORAL | Status: DC
  Administered 2022-11-18 – 2022-11-19 (×3): 50 mg via ORAL
  Filled 2022-11-18 (×4): qty 1

## 2022-11-18 MED ORDER — HEPARIN SODIUM (PORCINE) 5000 UNIT/ML IJ SOLN
5000.0000 [IU] | Freq: Three times a day (TID) | INTRAMUSCULAR | Status: DC
  Administered 2022-11-18 – 2022-11-21 (×8): 5000 [IU] via SUBCUTANEOUS
  Filled 2022-11-18 (×8): qty 1

## 2022-11-18 MED ORDER — ACETAMINOPHEN 650 MG RE SUPP: 650.0000 mg | Freq: Four times a day (QID) | RECTAL | Status: DC | PRN

## 2022-11-18 MED ORDER — FUROSEMIDE 10 MG/ML IJ SOLN
40.0000 mg | Freq: Once | INTRAMUSCULAR | Status: AC
  Administered 2022-11-18: 40 mg via INTRAVENOUS
  Filled 2022-11-18: qty 4

## 2022-11-18 MED ORDER — FUROSEMIDE 10 MG/ML IJ SOLN
40.0000 mg | Freq: Two times a day (BID) | INTRAMUSCULAR | Status: DC
  Administered 2022-11-19 – 2022-11-20 (×3): 40 mg via INTRAVENOUS
  Filled 2022-11-18 (×3): qty 4

## 2022-11-18 MED ORDER — CITALOPRAM HYDROBROMIDE 20 MG PO TABS
40.0000 mg | ORAL_TABLET | Freq: Every day | ORAL | Status: DC
  Administered 2022-11-19 – 2022-11-21 (×3): 40 mg via ORAL
  Filled 2022-11-18 (×3): qty 2

## 2022-11-18 MED ORDER — ONDANSETRON HCL 4 MG PO TABS: 4.0000 mg | ORAL_TABLET | Freq: Four times a day (QID) | ORAL | Status: DC | PRN

## 2022-11-18 MED ORDER — MORPHINE SULFATE (PF) 2 MG/ML IV SOLN: 2.0000 mg | INTRAVENOUS | Status: DC | PRN

## 2022-11-18 MED ORDER — IPRATROPIUM-ALBUTEROL 0.5-2.5 (3) MG/3ML IN SOLN: 3.0000 mL | Freq: Four times a day (QID) | RESPIRATORY_TRACT | Status: DC | PRN

## 2022-11-18 MED ORDER — IPRATROPIUM-ALBUTEROL 20-100 MCG/ACT IN AERS: 1.0000 | INHALATION_SPRAY | Freq: Four times a day (QID) | RESPIRATORY_TRACT | Status: DC | PRN

## 2022-11-18 MED ORDER — BUPRENORPHINE HCL-NALOXONE HCL 2-0.5 MG SL SUBL
1.0000 | SUBLINGUAL_TABLET | Freq: Every day | SUBLINGUAL | Status: DC
  Administered 2022-11-19 – 2022-11-20 (×2): 1 via SUBLINGUAL
  Filled 2022-11-18 (×2): qty 1

## 2022-11-18 MED ORDER — ONDANSETRON HCL 4 MG/2ML IJ SOLN: 4.0000 mg | Freq: Four times a day (QID) | INTRAMUSCULAR | Status: DC | PRN

## 2022-11-18 NOTE — Assessment & Plan Note (Signed)
-   Down to 77% on room air - Normalized on 3 L nasal cannula - Likely due to acute HFpEF  - Dyspnea on exertion, peripheral edema - Chest x-ray shows pulmonary edema - Last echo was a week ago and showed an ejection fraction of 60-65% - 40 mg IV Lasix started in the ED, continue 40 mg IV Lasix twice daily - Patient recently admitted for the same was treated for CHF and community-acquired pneumonia, finished a course of antibiotics and was discharged on room air at that time - Wean off as tolerated - Albuterol as needed - Continue to monitor

## 2022-11-18 NOTE — Assessment & Plan Note (Signed)
Continue Celexa and Remeron 

## 2022-11-18 NOTE — ED Triage Notes (Signed)
Pt sent over by heart care for admission due to CHF and low hemoglobin.

## 2022-11-18 NOTE — Assessment & Plan Note (Signed)
-   Hemoglobin improved from discharge - Currently 9.0 - Previous iron studies showed iron deficiency - continue home iron, added vitamin C for better iron absorption

## 2022-11-18 NOTE — Patient Instructions (Signed)
Patient to ED for acute heart failure and blood tranfusion   Report given to [facility], Charge nurse    Transferred via WC to room 14

## 2022-11-18 NOTE — Assessment & Plan Note (Signed)
Continue PPI ?

## 2022-11-18 NOTE — ED Provider Notes (Signed)
[place] EMERGENCY DEPARTMENT AT [facility] Provider Note   CSN: [id number] Arrival date & time: 11/18/22  1507     History  Chief Complaint  Patient presents with   Shortness of Breath    [name] is a 52 y.o. female.  HPI 52 year old female with a new diagnosis of mitral valve regurgitation and CHF presents with shortness of breath.  She was sent in from the cardiology office for diuresis and blood transfusion.  Patient states that after getting out of the hospital about a week ago she is gotten progressively short of breath over the last 3 days or so.  She is having worsening swelling diffusely including legs and abdomen.  She reports compliance with all of her medications.  Went to her post hospital follow-up this afternoon, and saw Dr. Jenene Slicker, and was referred to the ER.  Patient denies any chest pain.  She denies any blood in the stools or recent bleeding.  Home Medications Prior to Admission medications   Medication Sig Start Date End Date Taking? Authorizing Provider  acetaminophen (TYLENOL) 325 MG tablet Take 650 mg by mouth every 6 (six) hours as needed for moderate pain.   Yes [provider]  aspirin EC 81 MG tablet Take 81 mg by mouth 2 (two) times a week.    Yes [provider]  Aspirin-Acetaminophen-Caffeine (GOODYS EXTRA STRENGTH PO) Take 1 Package by mouth daily as needed (pain).   Yes [provider]  Buprenorphine HCl-Naloxone HCl 8-2 MG FILM Place under the tongue 2 (two) times daily. 11/04/22  Yes [provider]  citalopram (CELEXA) 40 MG tablet Take 40 mg by mouth daily.   Yes [provider]  furosemide (LASIX) 40 MG tablet Take 1 tablet (40 mg total) by mouth daily. 11/12/22 12/12/22 Yes Shah, Pratik D, DO  ibuprofen (ADVIL,MOTRIN) 200 MG tablet Take 800 mg by mouth every 6 (six) hours as needed for moderate pain.   Yes [provider]  Ipratropium-Albuterol (COMBIVENT RESPIMAT)  20-100 MCG/ACT AERS respimat Inhale 1 puff into the lungs every 6 (six) hours as needed for wheezing or shortness of breath. 11/11/22  Yes Sherryll Burger, Pratik D, DO  metoprolol tartrate (LOPRESSOR) 50 MG tablet Take 50 mg by mouth 2 (two) times daily.   Yes [provider]  mirtazapine (REMERON) 15 MG tablet Take 15 mg by mouth at bedtime as needed (depression).   Yes [provider]  omeprazole (PRILOSEC) 20 MG capsule Take 40 mg by mouth daily.   Yes [provider]  potassium chloride (KLOR-CON M) 10 MEQ tablet Take 1 tablet (10 mEq total) by mouth 2 (two) times daily. 11/11/22 12/11/22 Yes Shah, Pratik D, DO  guaiFENesin-dextromethorphan (ROBITUSSIN DM) 100-10 MG/5ML syrup Take 5 mLs by mouth every 4 (four) hours as needed for cough. Patient not taking: Reported on 11/18/2022 11/11/22   Maurilio Lovely D, DO      Allergies    Tramadol hcl, Ketorolac, and Propoxyphene    Review of Systems   Review of Systems  Respiratory:  Positive for shortness of breath.   Cardiovascular:  Positive for leg swelling. Negative for chest pain.  Gastrointestinal:  Negative for abdominal pain.    Physical Exam Updated Vital Signs BP (!) 131/91   Pulse 64   Temp 97.6 F (36.4 C) (Oral)   Resp 14   Ht 5\' 2"  (1.575 m)   Wt 103.9 kg   LMP 01/20/2015   SpO2 (!) 86%   BMI  41.90 kg/m  Physical Exam Vitals and nursing note reviewed.  Constitutional:      General: She is not in acute distress.    Appearance: She is well-developed. She is obese. She is not ill-appearing or diaphoretic.  HENT:     Head: Normocephalic and atraumatic.  Cardiovascular:     Rate and Rhythm: Normal rate and regular rhythm.     Heart sounds: Normal heart sounds.  Pulmonary:     Effort: Pulmonary effort is normal.     Breath sounds: Rales (mild, bases) present.  Abdominal:     Palpations: Abdomen is soft.     Tenderness: There is no abdominal tenderness.  Musculoskeletal:     Comments: Mild edema to  bilateral ankles  Skin:    General: Skin is warm and dry.  Neurological:     Mental Status: She is alert.     ED Results / Procedures / Treatments   Labs (all labs ordered are listed, but only abnormal results are displayed) Labs Reviewed  COMPREHENSIVE METABOLIC PANEL - Abnormal; Notable for the following components:      Result Value   Chloride 96 (*)    Glucose, Bld 121 (*)    Calcium 8.7 (*)    Albumin 3.3 (*)    All other components within normal limits  CBC WITH DIFFERENTIAL/PLATELET - Abnormal; Notable for the following components:   RBC 3.45 (*)    Hemoglobin 9.0 (*)    HCT 31.3 (*)    MCHC 28.8 (*)    RDW 19.8 (*)    nRBC 0.6 (*)    All other components within normal limits  BRAIN NATRIURETIC PEPTIDE - Abnormal; Notable for the following components:   B Natriuretic Peptide 544.0 (*)    All other components within normal limits  SARS CORONAVIRUS 2 BY RT PCR  TROPONIN I (HIGH SENSITIVITY)  TROPONIN I (HIGH SENSITIVITY)    EKG EKG Interpretation  Date/Time:  Monday November 18 2022 17:19:55 EDT Ventricular Rate:  61 PR Interval:  143 QRS Duration: 98 QT Interval:  439 QTC Calculation: 443 R Axis:   56 Text Interpretation: Sinus rhythm no acute ST/T changes similar to November 06 2022 Confirmed by Pricilla Loveless [phone]) on 11/18/2022 6:25:22 PM  Radiology DG Chest 2 View  Result Date: 11/18/2022 CLINICAL DATA:  Dyspnea, swelling. EXAM: CHEST - 2 VIEW COMPARISON:  11/06/2022. FINDINGS: Unchanged diffuse interstitial prominence with patchy opacities in the left-greater-than-right lung bases. Stable cardiac and mediastinal contours. No pleural effusion or pneumothorax. IMPRESSION: Unchanged diffuse interstitial prominence with patchy opacities in the left-greater-than-right lung bases. These findings may reflect pulmonary edema versus atypical/viral infection. Again, radiographic follow-up to resolution is recommended versus further characterization with chest CT.  Electronically Signed   By: Orvan Falconer M.D.   On: 11/18/2022 17:23    Procedures Procedures    Medications Ordered in ED Medications  furosemide (LASIX) injection 40 mg (has no administration in time range)    ED Course/ Medical Decision Making/ A&P                             Medical Decision Making Amount and/or Complexity of Data Reviewed Labs: ordered.    Details: Hemoglobin is mildly improving at 9.0.  BNP at 544 consistent with CHF Radiology: ordered and independent interpretation performed.    Details: Bilateral infiltrates consistent with CHF ECG/medicine tests: ordered and independent interpretation performed.    Details: No acute change  from baseline  Risk Prescription drug management. Decision regarding hospitalization.   Patient presents with CHF.  Sent in from cardiology.  She is not distress and has some slight low sats in the 90s on arrival here though was off the monitor for some time.  After she went to the bathroom, I went to hook her back up to the telemetry and her O2 sat was 77%.  She was placed on 3 L of oxygen.  She is doing better with the oxygen.  I think this is almost all CHF and she has had symptomatic dyspnea and leg swelling for a few days.  Will give IV Lasix.  Discussed with Dr. Carren Rang for admission.        Final Clinical Impression(s) / ED Diagnoses Final diagnoses:  Acute respiratory failure with hypoxia (HCC)  Acute on chronic congestive heart failure, unspecified heart failure type [facility])    Rx / DC Orders ED Discharge Orders     None         Pricilla Loveless, MD 11/18/22 1947

## 2022-11-18 NOTE — Progress Notes (Signed)
Cardiology Office Note  Date: 11/18/2022   ID: [name], DOB June 21, 1971, MRN [id number]  PCP:  [facility]  Cardiologist:  None Electrophysiologist:  None   Reason for Office Visit: Posthospitalization follow-up visit   History of Present Illness: [name] is a 52 y.o. female known to have HTN, severe iron deficiency anemia is here for follow-up visit.  Patient was recently admitted to [facility] on 11/06/2022 with acute diastolic heart failure exacerbation and possibly moderate to severe MR on the 2D echocardiogram performed on 11/07/2022. TEE was recommended hence. She underwent TEE on 11/11/2022 that showed mild to moderate MR. She also received IV diuresis along with 1 dose of IV iron 08/09/2022. She did not have any symptoms of SOB/DOE until 2 days after discharge from the hospital. She started to have DOE, 2-3 pillow orthopnea, PND and bilateral lower EXTR swelling.  I spoke with her best friend over the phone with whom the patient is currently staying.  At night, according to her friend, she wakes up with choking spells and cough.  On vitals today in the clinic, she was noted to have hypoxia with O2 saturation as low as 80s.  Hemoglobin was noted to drop down from 12.1 around 5 years ago to 7.8 a week ago.  She received IV iron 1 time during the hospitalization.  Past Medical History:  Diagnosis Date   Anxiety    Arthritis    Cancer (HCC)    Cervical cancer (HCC)    Depressed    Drug addiction (HCC) Hep C   Foot abscess, left    Gastroesophageal reflux disease    Gastroparesis    Hepatitis C    pt denies any treatment   Hypertension    Polysubstance dependence including opioid type drug, episodic abuse (HCC)    Psoriasis    Renal disorder    kidney stones    Past Surgical History:  Procedure Laterality Date   APPENDECTOMY     APPLICATION OF WOUND VAC Left 10/10/2017   Procedure: APPLICATION OF WOUND VAC;   Surgeon: Nadara Mustard, MD;  Location: [facility] OR;  Service: Orthopedics;  Laterality: Left;   ESOPHAGOGASTRODUODENOSCOPY  01/04/2008   Dr Teddy Spike reflux esophagitis, sm HH, gastritis, BRAVO positive for GERD on BID PPI   I & D EXTREMITY Left 10/03/2017   Procedure: IRRIGATION AND DEBRIDEMENT EXTREMITY;  Surgeon: Nadara Mustard, MD;  Location: [facility] OR;  Service: Orthopedics;  Laterality: Left;   IRRIGATION AND DEBRIDEMENT FOOT Left 10/03/2017   LASER ABLATION CONDYLOMA CERVICAL / VULVAR     mandible surgery for fracture     SKIN SPLIT GRAFT Left 10/10/2017   Procedure: SKIN GRAFT SPLIT THICKNESS LEFT FOOT;  Surgeon: Nadara Mustard, MD;  Location: [facility] OR;  Service: Orthopedics;  Laterality: Left;   TEE WITHOUT CARDIOVERSION N/A 11/11/2022   Procedure: TRANSESOPHAGEAL ECHOCARDIOGRAM (TEE);  Surgeon: Marjo Bicker, MD;  Location: [facility] ORS;  Service: Cardiovascular;  Laterality: N/A;   TUBAL LIGATION      Current Outpatient Medications  Medication Sig Dispense Refill   aspirin EC 81 MG tablet Take 81 mg by mouth 2 (two) times a week.      Aspirin-Acetaminophen-Caffeine (GOODYS EXTRA STRENGTH PO) Take 1 Package by mouth daily as needed (pain).     Buprenorphine HCl-Naloxone HCl 8-2 MG FILM Place under the tongue 2 (two) times daily.     citalopram (CELEXA) 40 MG tablet Take 40 mg by  mouth daily.     furosemide (LASIX) 40 MG tablet Take 1 tablet (40 mg total) by mouth daily. 30 tablet 0   ibuprofen (ADVIL,MOTRIN) 200 MG tablet Take 800 mg by mouth every 6 (six) hours as needed for moderate pain.     Ipratropium-Albuterol (COMBIVENT RESPIMAT) 20-100 MCG/ACT AERS respimat Inhale 1 puff into the lungs every 6 (six) hours as needed for wheezing or shortness of breath. 4 g 1   metoprolol tartrate (LOPRESSOR) 50 MG tablet Take 50 mg by mouth 2 (two) times daily.     mirtazapine (REMERON) 15 MG tablet Take 15 mg by mouth at bedtime as needed (depression).     omeprazole (PRILOSEC) 20 MG capsule Take 40  mg by mouth daily.     potassium chloride (KLOR-CON M) 10 MEQ tablet Take 1 tablet (10 mEq total) by mouth 2 (two) times daily. 60 tablet 0   amLODipine (NORVASC) 10 MG tablet Take 1 tablet (10 mg total) by mouth daily. (Patient not taking: Reported on 11/18/2022) 30 tablet 1   guaiFENesin-dextromethorphan (ROBITUSSIN DM) 100-10 MG/5ML syrup Take 5 mLs by mouth every 4 (four) hours as needed for cough. (Patient not taking: Reported on 11/18/2022) 118 mL 0   naproxen (NAPROSYN) 375 MG tablet Take 375 mg by mouth 2 (two) times daily as needed for moderate pain. (Patient not taking: Reported on 11/18/2022)     No current facility-administered medications for this visit.   Allergies:  Tramadol hcl, Ketorolac, and Propoxyphene   Social History: The patient  reports that she has been smoking cigarettes. She has a 28.00 pack-year smoking history. She has never used smokeless tobacco. She reports current drug use. Drugs: Cocaine, IV, "Crack" cocaine, Marijuana, and Opium. She reports that she does not drink alcohol.   Family History: The patient's family history includes Diabetes in her mother; Hepatitis C in her sister; Hyperlipidemia in her mother; Hypertension in her father, mother, and sister.   ROS:  Please see the history of present illness. Otherwise, complete review of systems is positive for none.  All other systems are reviewed and negative.   Physical Exam: VS:  BP 122/68   Pulse 77   Ht 5\' 2"  (1.575 m)   Wt 229 lb (103.9 kg)   LMP 01/20/2015   SpO2 (!) 89%   BMI 41.88 kg/m , BMI Body mass index is 41.88 kg/m.  Wt Readings from Last 3 Encounters:  11/18/22 229 lb (103.9 kg)  11/11/22 223 lb 15.8 oz (101.6 kg)  12/29/17 173 lb (78.5 kg)    General: Patient appears comfortable at rest. HEENT: Conjunctiva and lids normal, oropharynx clear with moist mucosa. Neck: Supple, no elevated JVP or carotid bruits, no thyromegaly. Lungs: Bilateral rales and wheezing Cardiac: Regular rate and  rhythm, no S3 or significant systolic murmur, no pericardial rub. Abdomen: Soft, nontender, no hepatomegaly, bowel sounds present, no guarding or rebound. Extremities: 1+ pitting edema in bilateral lower extremities, distal pulses 2+. Skin: Warm and dry. Musculoskeletal: No kyphosis. Neuropsychiatric: Alert and oriented x3, affect grossly appropriate.  Recent Labwork: 11/06/2022: B Natriuretic Peptide 921.0 11/07/2022: ALT 23; AST 27 11/08/2022: Hemoglobin 8.4; Magnesium 2.2; Platelets 366 11/11/2022: BUN 25; Creatinine, Ser 0.90; Potassium 3.8; Sodium 138     Component Value Date/Time   CHOL 200 07/10/2007 2203   TRIG 418 (H) 07/10/2007 2203   HDL 37 (L) 07/10/2007 2203   CHOLHDL 5.4 Ratio 07/10/2007 2203   VLDL NOT CALC mg/dL 16/04/9603 5409   LDLCALC See Comment  mg/dL 82/95/6213 0865    Other Studies Reviewed Today:   Assessment and Plan: Patient is a 52 year old F known to have HTN, severe iron deficiency anemia is here for follow-up visit posthospitalization.  # Acute on chronic diastolic heart failure exacerbation likely secondary to high-output heart failure from severe iron deficiency anemia -Patient has symptoms of DOE, 2-3 pillow orthopnea, PND, cough and bilateral lower EXTR swelling x 1 week. Vitals today showed hypoxia 89% on room air and it was dropping as low as 80s. She will benefit from ER visit and subsequent hospital admission for IV diuresis. Underlying severe iron deficiency anemia will need to be treated adequately to prevent heart failure readmission. I spoke with the ER attending, Dr. Gloris Manchester and the inpatient cardiology team is aware to see the patient tomorrow. Consider GI consultation. -The high-output heart failure from severe anemia might have exaggerated the severity of mitral valve regurgitation on 11/08/2022 when she underwent 2D echocardiogram.  However TEE performed on the day of discharge showed mild to moderate MR as she was volume compensated by the  time.  # Severe iron deficiency anemia -Hemoglobin dropped from 12.1 around 5 years ago to 7.8 around 2 weeks ago. No active bleeding, denied any hematochezia or melena. Iron panel in 2 weeks ago showed serum iron 23, total iron-binding capacity was 455, iron saturation 5% and serum ferritin 12 consistent with severe iron deficiency anemia. -GI consult and blood transfusion after IV diuresis   I have spent a total of 45 minutes with patient reviewing chart, EKGs, labs and examining patient as well as establishing an assessment and plan that was discussed with the patient.  > 50% of time was spent in direct patient care.    Medication Adjustments/Labs and Tests Ordered: Current medicines are reviewed at length with the patient today.  Concerns regarding medicines are outlined above.   Tests Ordered: No orders of the defined types were placed in this encounter.   Medication Changes: No orders of the defined types were placed in this encounter.   Disposition:  Follow up  6 months  Signed, Kunta Hilleary Verne Spurr, MD, 11/18/2022 1:57 PM    [facility] [address], [place], [place] [address]

## 2022-11-18 NOTE — Assessment & Plan Note (Signed)
-   Last EF was 60-65% 1 week ago - Patient presented with dyspnea on exertion, peripheral edema, pulmonary edema on chest x-ray - She initially on presentation was hypoxic down to 77% on room air - Continue aspirin, metoprolol - Continue Lasix increased to 60 mg IV twice daily by cardiology  - Barrier to discharge is volume overload - Reds clip readings remain HIGH, continue fluid restrictions, closely monitor intake and output - Continue to monitor -- started on empagliflozin 10 mg by cardiology and pharmTech working on prior authorization

## 2022-11-18 NOTE — H&P (Signed)
History and Physical    Patient: [name] ZOX:[id number] DOB: 01/18/1971 DOA: 11/18/2022 DOS: the patient was seen and examined on 11/18/2022 PCP: Pcp, No  Patient coming from: Home  Chief Complaint:  Chief Complaint  Patient presents with   Shortness of Breath   HPI: [name] is a 52 y.o. female with medical history significant of with history of anxiety, arthritis, depression, chronic back pain, gastroparesis, hepatitis C, hypertension, polysubstance abuse, and more presents the ED with a chief complaint of dyspnea.  Patient was just admitted to the hospital and had been discharged 1 week ago.  She reports that since she been home she was feeling okay for 2 days, and then had gradual onset of dyspnea again.  It has been progressively worse since it started.  It is worse with exertion.  She has noticed peripheral edema in her bilateral lower extremities, abdomen, and face per her report.  Patient reports she has been taking all the medication she was prescribed at discharge.  She also reports that she loves water and drinks as much as she can.  She thinks she drinks about 60 ounces of water per day in addition to soda and sweet tea.  We discussed the importance of fluid restrictions in patients with CHF.  Patient reports that she is confused because one of her doctors tells her to drink more water.  We discussed weighing herself each day to determine if she is drinking too much water and if she is starting to have fluid buildup before it becomes a problem.  Patient reports understanding.  Patient reports she has had associated cough.  Is productive of clear frothy sputum.  She denies fevers, chest pain, palpitations.  Patient has no other complaints at this time aside from chronic low back pain.   Review of Systems: As mentioned in the history of present illness. All other systems reviewed and are negative. Past Medical History:  Diagnosis Date   Anxiety    Arthritis     Cancer (HCC)    Cervical cancer (HCC)    Depressed    Drug addiction (HCC) Hep C   Foot abscess, left    Gastroesophageal reflux disease    Gastroparesis    Hepatitis C    pt denies any treatment   Hypertension    Polysubstance dependence including opioid type drug, episodic abuse (HCC)    Psoriasis    Renal disorder    kidney stones   Past Surgical History:  Procedure Laterality Date   APPENDECTOMY     APPLICATION OF WOUND VAC Left 10/10/2017   Procedure: APPLICATION OF WOUND VAC;  Surgeon: Nadara Mustard, MD;  Location: [facility] OR;  Service: Orthopedics;  Laterality: Left;   ESOPHAGOGASTRODUODENOSCOPY  01/04/2008   Dr Teddy Spike reflux esophagitis, sm HH, gastritis, BRAVO positive for GERD on BID PPI   I & D EXTREMITY Left 10/03/2017   Procedure: IRRIGATION AND DEBRIDEMENT EXTREMITY;  Surgeon: Nadara Mustard, MD;  Location: [facility] OR;  Service: Orthopedics;  Laterality: Left;   IRRIGATION AND DEBRIDEMENT FOOT Left 10/03/2017   LASER ABLATION CONDYLOMA CERVICAL / VULVAR     mandible surgery for fracture     SKIN SPLIT GRAFT Left 10/10/2017   Procedure: SKIN GRAFT SPLIT THICKNESS LEFT FOOT;  Surgeon: Nadara Mustard, MD;  Location: [facility] OR;  Service: Orthopedics;  Laterality: Left;   TEE WITHOUT CARDIOVERSION N/A 11/11/2022   Procedure: TRANSESOPHAGEAL ECHOCARDIOGRAM (TEE);  Surgeon: Marjo Bicker, MD;  Location: [facility]S;  Service: Cardiovascular;  Laterality: N/A;   TUBAL LIGATION     Social History:  reports that she has been smoking cigarettes. She has a 28.00 pack-year smoking history. She has never used smokeless tobacco. She reports current drug use. Drugs: Cocaine, IV, "Crack" cocaine, Marijuana, and Opium. She reports that she does not drink alcohol.  Allergies  Allergen Reactions   Tramadol Hcl Anaphylaxis, Hives, Itching and Swelling    Of tongue   Ketorolac Nausea And Vomiting, Rash and Other (See Comments)   Propoxyphene Rash    Family History  Problem Relation Age of  Onset   Hyperlipidemia Mother    Diabetes Mother    Hypertension Mother    Hypertension Sister    Hepatitis C Sister    Hypertension Father    Colon cancer Neg Hx    Liver disease Neg Hx     Prior to Admission medications   Medication Sig Start Date End Date Taking? Authorizing Provider  acetaminophen (TYLENOL) 325 MG tablet Take 650 mg by mouth every 6 (six) hours as needed for moderate pain.   Yes [provider]  aspirin EC 81 MG tablet Take 81 mg by mouth 2 (two) times a week.    Yes [provider]  Aspirin-Acetaminophen-Caffeine (GOODYS EXTRA STRENGTH PO) Take 1 Package by mouth daily as needed (pain).   Yes [provider]  Buprenorphine HCl-Naloxone HCl 8-2 MG FILM Place under the tongue 2 (two) times daily. 11/04/22  Yes [provider]  citalopram (CELEXA) 40 MG tablet Take 40 mg by mouth daily.   Yes [provider]  furosemide (LASIX) 40 MG tablet Take 1 tablet (40 mg total) by mouth daily. 11/12/22 12/12/22 Yes Shah, Pratik D, DO  ibuprofen (ADVIL,MOTRIN) 200 MG tablet Take 800 mg by mouth every 6 (six) hours as needed for moderate pain.   Yes [provider]  Ipratropium-Albuterol (COMBIVENT RESPIMAT) 20-100 MCG/ACT AERS respimat Inhale 1 puff into the lungs every 6 (six) hours as needed for wheezing or shortness of breath. 11/11/22  Yes Sherryll Burger, Pratik D, DO  metoprolol tartrate (LOPRESSOR) 50 MG tablet Take 50 mg by mouth 2 (two) times daily.   Yes [provider]  mirtazapine (REMERON) 15 MG tablet Take 15 mg by mouth at bedtime as needed (depression).   Yes [provider]  omeprazole (PRILOSEC) 20 MG capsule Take 40 mg by mouth daily.   Yes [provider]  potassium chloride (KLOR-CON M) 10 MEQ tablet Take 1 tablet (10 mEq total) by mouth 2 (two) times daily. 11/11/22 12/11/22 Yes Shah, Pratik D, DO  guaiFENesin-dextromethorphan (ROBITUSSIN DM) 100-10 MG/5ML syrup Take 5 mLs by mouth every 4 (four)  hours as needed for cough. Patient not taking: Reported on 11/18/2022 11/11/22   Maurilio Lovely D, DO    Physical Exam: Vitals:   11/18/22 1515 11/18/22 1730  BP: 132/76 (!) 131/91  Pulse: 64 64  Resp: 18 14  Temp: 97.6 F (36.4 C)   TempSrc: Oral   SpO2: 91% (!) 86%  Weight: 103.9 kg   Height: 5\' 2"  (1.575 m)    1.  General: Patient lying supine in bed,  no acute distress   2. Psychiatric: Alert and oriented x 3, mood and behavior normal for situation, pleasant and cooperative with exam   3. Neurologic: Speech and language are normal, face is symmetric, moves all 4 extremities voluntarily, at baseline without acute deficits on limited exam   4. HEENMT:  Head is atraumatic, normocephalic,  pupils reactive to light, neck is supple, trachea is midline, mucous membranes are moist   5. Respiratory : Lungs are clear to auscultation bilaterally without wheezing, rhonchi, rales, no cyanosis, no increase in work of breathing or accessory muscle use   6. Cardiovascular : Heart rate normal, rhythm is regular, low-grade murmur, rubs or gallops, positive for peripheral edema, peripheral pulses palpated   7. Gastrointestinal:  Abdomen is soft, nondistended, nontender to palpation bowel sounds active, no masses or organomegaly palpated   8. Skin:  Skin is warm, dry and intact without rashes, acute lesions, or ulcers on limited exam   9.Musculoskeletal:  No acute deformities or trauma, no asymmetry in tone, positive for peripheral edema, peripheral pulses palpated, no tenderness to palpation in the extremities  Data Reviewed: In the ED Temp 97.6, heart rate 64-77, respiratory rate 14-18, blood pressure 122/68-132/91, satting 77-91% in the ER No leukocytosis with white blood cell count of 8.0, hemoglobin 9.0, platelets 275 Chemistry unremarkable BNP [address] Chest x-ray shows pulmonary edema versus atypical infection EKG shows a heart rate of 61, sinus rhythm, QTc 443 IV Lasix 40  mg given Admission requested for acute respiratory failure with hypoxia likely secondary to CHF exacerbation  Assessment and Plan: * Acute on chronic diastolic heart failure (HCC) - Last EF was 60-65% 1 week ago - Patient has dyspnea on exertion, peripheral edema, pulmonary edema on chest x-ray - She is been hypoxic down to 77% on room air - Continue aspirin, metoprolol - IV Lasix 40 mg given in the ED - Continue Lasix 40 mg IV twice daily - Barrier to discharge is oxygen requirement - Reds clip readings, fluid restrictions, intake and output - Continue to monitor  Acute hypoxic respiratory failure (HCC) - Down to 77% on room air - Normalized on 3 L nasal cannula - Likely due to CHF - Dyspnea on exertion, peripheral edema - Chest x-ray shows pulmonary edema - Last echo was a week ago and showed an ejection fraction of 60-65% - 40 mg IV Lasix started in the ED, continue 40 mg IV Lasix twice daily - Patient recently admitted for the same was treated for CHF and community-acquired pneumonia, finished a course of antibiotics and was discharged on room air at that time - Wean off as tolerated - Albuterol as needed - Continue to monitor  Anemia - Hemoglobin improved from discharge - Currently 9.0 - Previous iron studies showed iron deficiency - Start iron  GERD - Continue PPI  Depression - Continue Celexa and Remeron      Advance Care Planning:   Code Status: Full Code  Consults: None at this time  Family Communication: No family at bedside  Severity of Illness: The appropriate patient status for this patient is OBSERVATION. Observation status is judged to be reasonable and necessary in order to provide the required intensity of service to ensure the patient's safety. The patient's presenting symptoms, physical exam findings, and initial radiographic and laboratory data in the context of their medical condition is felt to place them at decreased risk for further clinical  deterioration. Furthermore, it is anticipated that the patient will be medically stable for discharge from the hospital within 2 midnights of admission.   Author: Lilyan Gilford, DO 11/18/2022 8:40 PM  For on call review [email or web address].

## 2022-11-19 ENCOUNTER — Other Ambulatory Visit (HOSPITAL_COMMUNITY): Payer: Self-pay

## 2022-11-19 ENCOUNTER — Telehealth (HOSPITAL_COMMUNITY): Payer: Self-pay | Admitting: Pharmacy Technician

## 2022-11-19 DIAGNOSIS — G8929 Other chronic pain: Secondary | ICD-10-CM | POA: Diagnosis present

## 2022-11-19 DIAGNOSIS — I5033 Acute on chronic diastolic (congestive) heart failure: Secondary | ICD-10-CM | POA: Diagnosis present

## 2022-11-19 DIAGNOSIS — Z8249 Family history of ischemic heart disease and other diseases of the circulatory system: Secondary | ICD-10-CM | POA: Diagnosis not present

## 2022-11-19 DIAGNOSIS — Z833 Family history of diabetes mellitus: Secondary | ICD-10-CM | POA: Diagnosis not present

## 2022-11-19 DIAGNOSIS — Z79899 Other long term (current) drug therapy: Secondary | ICD-10-CM | POA: Diagnosis not present

## 2022-11-19 DIAGNOSIS — E669 Obesity, unspecified: Secondary | ICD-10-CM | POA: Diagnosis present

## 2022-11-19 DIAGNOSIS — F1120 Opioid dependence, uncomplicated: Secondary | ICD-10-CM | POA: Diagnosis present

## 2022-11-19 DIAGNOSIS — F32A Depression, unspecified: Secondary | ICD-10-CM | POA: Diagnosis present

## 2022-11-19 DIAGNOSIS — I340 Nonrheumatic mitral (valve) insufficiency: Secondary | ICD-10-CM | POA: Diagnosis present

## 2022-11-19 DIAGNOSIS — F1920 Other psychoactive substance dependence, uncomplicated: Secondary | ICD-10-CM | POA: Diagnosis present

## 2022-11-19 DIAGNOSIS — F17210 Nicotine dependence, cigarettes, uncomplicated: Secondary | ICD-10-CM | POA: Diagnosis present

## 2022-11-19 DIAGNOSIS — Z7982 Long term (current) use of aspirin: Secondary | ICD-10-CM | POA: Diagnosis not present

## 2022-11-19 DIAGNOSIS — Z8541 Personal history of malignant neoplasm of cervix uteri: Secondary | ICD-10-CM | POA: Diagnosis not present

## 2022-11-19 DIAGNOSIS — Z560 Unemployment, unspecified: Secondary | ICD-10-CM | POA: Diagnosis not present

## 2022-11-19 DIAGNOSIS — K219 Gastro-esophageal reflux disease without esophagitis: Secondary | ICD-10-CM | POA: Diagnosis present

## 2022-11-19 DIAGNOSIS — J9601 Acute respiratory failure with hypoxia: Secondary | ICD-10-CM | POA: Diagnosis present

## 2022-11-19 DIAGNOSIS — I5032 Chronic diastolic (congestive) heart failure: Secondary | ICD-10-CM | POA: Diagnosis not present

## 2022-11-19 DIAGNOSIS — I509 Heart failure, unspecified: Secondary | ICD-10-CM | POA: Diagnosis present

## 2022-11-19 DIAGNOSIS — Z83438 Family history of other disorder of lipoprotein metabolism and other lipidemia: Secondary | ICD-10-CM | POA: Diagnosis not present

## 2022-11-19 DIAGNOSIS — D509 Iron deficiency anemia, unspecified: Secondary | ICD-10-CM | POA: Diagnosis present

## 2022-11-19 DIAGNOSIS — I272 Pulmonary hypertension, unspecified: Secondary | ICD-10-CM | POA: Diagnosis present

## 2022-11-19 DIAGNOSIS — Z1152 Encounter for screening for COVID-19: Secondary | ICD-10-CM | POA: Diagnosis not present

## 2022-11-19 DIAGNOSIS — I110 Hypertensive heart disease with heart failure: Secondary | ICD-10-CM | POA: Diagnosis present

## 2022-11-19 DIAGNOSIS — Z23 Encounter for immunization: Secondary | ICD-10-CM | POA: Diagnosis not present

## 2022-11-19 DIAGNOSIS — B1920 Unspecified viral hepatitis C without hepatic coma: Secondary | ICD-10-CM | POA: Diagnosis present

## 2022-11-19 DIAGNOSIS — K3184 Gastroparesis: Secondary | ICD-10-CM | POA: Diagnosis present

## 2022-11-19 LAB — COMPREHENSIVE METABOLIC PANEL
ALT: 31 U/L (ref 0–44)
AST: 27 U/L (ref 15–41)
Albumin: 3 g/dL — ABNORMAL LOW (ref 3.5–5.0)
Alkaline Phosphatase: 53 U/L (ref 38–126)
Anion gap: 12 (ref 5–15)
BUN: 18 mg/dL (ref 6–20)
CO2: 32 mmol/L (ref 22–32)
Calcium: 8.9 mg/dL (ref 8.9–10.3)
Chloride: 97 mmol/L — ABNORMAL LOW (ref 98–111)
Creatinine, Ser: 0.9 mg/dL (ref 0.44–1.00)
GFR, Estimated: >60 mL/min (ref >60)
Glucose, Bld: 112 mg/dL — ABNORMAL HIGH (ref 70–99)
Potassium: 3.5 mmol/L (ref 3.5–5.1)
Sodium: 141 mmol/L (ref 135–145)
Total Bilirubin: 0.3 mg/dL (ref 0.3–1.2)
Total Protein: 6.4 g/dL — ABNORMAL LOW (ref 6.5–8.1)

## 2022-11-19 LAB — CBC WITH DIFFERENTIAL/PLATELET
Abs Immature Granulocytes: 0.05 10*3/uL (ref 0.00–0.07)
Basophils Absolute: 0 10*3/uL (ref 0.0–0.1)
Basophils Relative: 1 %
Eosinophils Absolute: 0.3 10*3/uL (ref 0.0–0.5)
Eosinophils Relative: 4 %
HCT: 32.9 % — ABNORMAL LOW (ref 36.0–46.0)
Hemoglobin: 9.3 g/dL — ABNORMAL LOW (ref 12.0–15.0)
Immature Granulocytes: 1 %
Lymphocytes Relative: 38 %
Lymphs Abs: 2.7 10*3/uL (ref 0.7–4.0)
MCH: 25.8 pg — ABNORMAL LOW (ref 26.0–34.0)
MCHC: 28.3 g/dL — ABNORMAL LOW (ref 30.0–36.0)
MCV: 91.1 fL (ref 80.0–100.0)
Monocytes Absolute: 0.5 10*3/uL (ref 0.1–1.0)
Monocytes Relative: 8 %
Neutro Abs: 3.5 10*3/uL (ref 1.7–7.7)
Neutrophils Relative %: 48 %
Platelets: 248 10*3/uL (ref 150–400)
RBC: 3.61 MIL/uL — ABNORMAL LOW (ref 3.87–5.11)
RDW: 19.7 % — ABNORMAL HIGH (ref 11.5–15.5)
WBC: 7.1 10*3/uL (ref 4.0–10.5)
nRBC: 0.8 % — ABNORMAL HIGH (ref 0.0–0.2)

## 2022-11-19 LAB — MAGNESIUM: Magnesium: 2 mg/dL (ref 1.7–2.4)

## 2022-11-19 MED ORDER — METOCLOPRAMIDE HCL 5 MG/ML IJ SOLN
10.0000 mg | Freq: Once | INTRAMUSCULAR | Status: AC
  Administered 2022-11-19: 10 mg via INTRAVENOUS
  Filled 2022-11-19: qty 2

## 2022-11-19 MED ORDER — EMPAGLIFLOZIN 10 MG PO TABS
10.0000 mg | ORAL_TABLET | Freq: Every day | ORAL | Status: DC
  Administered 2022-11-19 – 2022-11-21 (×3): 10 mg via ORAL
  Filled 2022-11-19 (×4): qty 1

## 2022-11-19 MED ORDER — HYDROXYZINE HCL 25 MG PO TABS
25.0000 mg | ORAL_TABLET | Freq: Three times a day (TID) | ORAL | Status: DC | PRN
  Filled 2022-11-19: qty 1

## 2022-11-19 MED ORDER — MORPHINE SULFATE (PF) 2 MG/ML IV SOLN: 1.0000 mg | INTRAVENOUS | Status: DC | PRN

## 2022-11-19 MED ORDER — VITAMIN C 500 MG PO TABS
500.0000 mg | ORAL_TABLET | Freq: Every day | ORAL | Status: DC
  Administered 2022-11-20 – 2022-11-21 (×2): 500 mg via ORAL
  Filled 2022-11-19 (×2): qty 1

## 2022-11-19 NOTE — Hospital Course (Signed)
52 y.o. female with medical history significant of with history of anxiety, arthritis, depression, chronic back pain, gastroparesis, hepatitis C, hypertension, polysubstance abuse, and more presents the ED with a chief complaint of dyspnea.  Patient was just admitted to the hospital and had been discharged 1 week ago.  She reports that since she been home she was feeling okay for 2 days, and then had gradual onset of dyspnea again.  It has been progressively worse since it started.  It is worse with exertion.  She has noticed peripheral edema in her bilateral lower extremities, abdomen, and face per her report.  Patient reports she has been taking all the medication she was prescribed at discharge.  She also reports that she loves water and drinks as much as she can.  She thinks she drinks about 60 ounces of water per day in addition to soda and sweet tea.  We discussed the importance of fluid restrictions in patients with CHF.  Patient reports that she is confused because one of her doctors tells her to drink more water.  We discussed weighing herself each day to determine if she is drinking too much water and if she is starting to have fluid buildup before it becomes a problem.  Patient reports understanding.  Patient reports she has had associated cough.  Is productive of clear frothy sputum.  She denies fevers, chest pain, palpitations.  Patient has no other complaints at this time aside from chronic low back pain.

## 2022-11-19 NOTE — Progress Notes (Signed)
   11/19/22 1131  ReDS Vest / Clip  BMI (Calculated) 40.41  Station Marker B  Ruler Value 23  ReDS Value Range (!) > 40  ReDS Actual Value 59

## 2022-11-19 NOTE — TOC Initial Note (Signed)
Transition of Care [facility]) - Initial/Assessment Note    Patient Details  Name: [name] MRN: [id number] Date of Birth: 07-17-1971  Transition of Care [facility]) CM/SW Contact:    Karn Cassis, LCSW Phone Number: 11/19/2022, 8:32 AM  Clinical Narrative:  Pt admitted due to acute on chronic diastolic heart failure. TOC received consult for CHF screening. Pt reports she lives with her mother and is independent with ADLS. She uses [facility] for transportation to appointments. Pt plans to return home when medically stable and no needs reported at this time. CHF screening completed. Pt states she was diagnosed with CHF just last week. She indicates she has not been weighing herself, but has scale at home. LCSW provided brief education on importance of daily weights. Pt said she is following a heart healthy diet and taking medications as prescribed. CHF education added to discharge instructions. TOC will continue to follow.                 Expected Discharge Plan: Home/Self Care Barriers to Discharge: Continued Medical Work up   Patient Goals and CMS Choice Patient states their goals for this hospitalization and ongoing recovery are:: return home   Choice offered to / list presented to : Patient  ownership interest in [facility].provided to::  (n/a)    Expected Discharge Plan and Services In-house Referral: Clinical Social Work     Living arrangements for the past 2 months: Single Family Home                                      Prior Living Arrangements/Services Living arrangements for the past 2 months: Single Family Home Lives with:: Parents Patient language and need for interpreter reviewed:: Yes Do you feel safe going back to the place where you live?: Yes      Need for Family Participation in Patient Care: No (Comment)     Criminal Activity/Legal Involvement Pertinent to Current Situation/Hospitalization: No - Comment as  needed  Activities of Daily Living Home Assistive Devices/Equipment: None ADL Screening (condition at time of admission) Patient's cognitive ability adequate to safely complete daily activities?: Yes Is the patient deaf or have difficulty hearing?: No Does the patient have difficulty seeing, even when wearing glasses/contacts?: No Does the patient have difficulty concentrating, remembering, or making decisions?: No Patient able to express need for assistance with ADLs?: Yes Does the patient have difficulty dressing or bathing?: No Independently performs ADLs?: Yes (appropriate for developmental age) Does the patient have difficulty walking or climbing stairs?: No Weakness of Legs: None Weakness of Arms/Hands: None  Permission Sought/Granted                  Emotional Assessment     Affect (typically observed): Appropriate Orientation: : Oriented to Self, Oriented to Place, Oriented to  Time, Oriented to Situation Alcohol / Substance Use: Not Applicable Psych Involvement: No (comment)  Admission diagnosis:  CHF exacerbation (HCC) [I50.9] Acute respiratory failure with hypoxia (HCC) [J96.01] Acute on chronic congestive heart failure, unspecified heart failure type (HCC) [I50.9] Patient Active Problem List   Diagnosis Date Noted   Acute on chronic diastolic heart failure (HCC) 11/18/2022   Iron deficiency anemia due to chronic blood loss 11/18/2022   CHF exacerbation (HCC) 11/18/2022   Nonrheumatic mitral valve regurgitation 11/11/2022   Acute hypoxic respiratory failure (HCC) 11/06/2022   Elevated brain natriuretic peptide (BNP)  level 11/06/2022   CAP (community acquired pneumonia) 11/06/2022   Acute bronchitis 11/06/2022   Wound, open, foot with complication, left, sequela 10/10/2017   Cellulitis and abscess of foot 10/02/2017   Chronic pelvic pain in female 03/23/2015   Cancer (HCC)    Hematemesis 05/27/2013   Urinary tract infection, site not specified 09/10/2012    Polysubstance dependence including opioid type drug, episodic abuse (HCC) 09/10/2012   Gram-negative bacteremia 09/07/2012   Acute renal failure (HCC) 09/07/2012   Anemia 09/06/2012   Hypokalemia 09/06/2012   Blood in stool 09/06/2012   Tobacco abuse 08/05/2012   H/O intravenous drug use in remission 08/05/2012   Hyponatremia 08/05/2012   Psoas abscess, left (HCC) 08/05/2012   GASTROPARESIS 09/23/2008   NECK PAIN 11/12/2007   HYPERLIPIDEMIA 07/10/2007   ANXIETY 07/10/2007   Depression 07/10/2007   HYPERTENSION 07/10/2007   GERD 07/10/2007   PEPTIC ULCER DISEASE 07/10/2007   UNSPECIFIED DISORDER OF UTERUS 07/10/2007   PSORIASIS 07/10/2007   ARTHRITIS 07/10/2007   LOW BACK PAIN 07/10/2007   WEIGHT GAIN, ABNORMAL 07/10/2007   COUGH 07/10/2007   CERVICAL CANCER, HX OF 07/10/2007   MIGRAINES, HX OF 07/10/2007   PCP:  Oneita Hurt, No Pharmacy:   [facility] - [place], [place] - [address] [place] [address] Phone: [phone] Fax: [phone]     Social Determinants of Health (SDOH) Social History: SDOH Screenings   Food Insecurity: No Food Insecurity (11/18/2022)  Housing: Low Risk  (11/18/2022)  Transportation Needs: No Transportation Needs (11/18/2022)  Utilities: Not At Risk (11/18/2022)  Tobacco Use: High Risk (11/18/2022)   SDOH Interventions:     Readmission Risk Interventions     No data to display

## 2022-11-19 NOTE — Telephone Encounter (Signed)
Pharmacy Patient Advocate Encounter  Insurance verification completed.    The patient is insured through [employer] [place] [employer]   The patient is currently admitted and ran test claims for the following: Farxiga, Jardiance.  Copays and coinsurance results were relayed to Inpatient clinical team.

## 2022-11-19 NOTE — Progress Notes (Signed)
PROGRESS NOTE   [name]  ZOX:[id number] DOB: May 19, 1971 DOA: 11/18/2022 PCP: Pcp, No   Chief Complaint  Patient presents with   Shortness of Breath   Level of care: Telemetry  Brief Admission History:   52 y.o. female with medical history significant of with history of anxiety, arthritis, depression, chronic back pain, gastroparesis, hepatitis C, hypertension, polysubstance abuse, and more presents the ED with a chief complaint of dyspnea.  Patient was just admitted to the hospital and had been discharged 1 week ago.  She reports that since she been home she was feeling okay for 2 days, and then had gradual onset of dyspnea again.  It has been progressively worse since it started.  It is worse with exertion.  She has noticed peripheral edema in her bilateral lower extremities, abdomen, and face per her report.  Patient reports she has been taking all the medication she was prescribed at discharge.  She also reports that she loves water and drinks as much as she can.  She thinks she drinks about 60 ounces of water per day in addition to soda and sweet tea.  We discussed the importance of fluid restrictions in patients with CHF.  Patient reports that she is confused because one of her doctors tells her to drink more water.  We discussed weighing herself each day to determine if she is drinking too much water and if she is starting to have fluid buildup before it becomes a problem.  Patient reports understanding.  Patient reports she has had associated cough.  Is productive of clear frothy sputum.  She denies fevers, chest pain, palpitations.  Patient has no other complaints at this time aside from chronic low back pain.    Assessment and Plan: * Acute HFpEF - Last EF was 60-65% 1 week ago - Patient has dyspnea on exertion, peripheral edema, pulmonary edema on chest x-ray - She is been hypoxic down to 77% on room air - Continue aspirin, metoprolol - IV Lasix 40 mg given in the ED -  Continue Lasix 40 mg IV twice daily - Barrier to discharge is oxygen requirement - Reds clip readings, fluid restrictions, intake and output - Continue to monitor -- started on empagliflozin 10 mg by cardiology and pharmTech working on prior authorization  Acute hypoxic respiratory failure (HCC) - Down to 77% on room air - Normalized on 3 L nasal cannula - Likely due to acute HFpEF  - Dyspnea on exertion, peripheral edema - Chest x-ray shows pulmonary edema - Last echo was a week ago and showed an ejection fraction of 60-65% - 40 mg IV Lasix started in the ED, continue 40 mg IV Lasix twice daily - Patient recently admitted for the same was treated for CHF and community-acquired pneumonia, finished a course of antibiotics and was discharged on room air at that time - Wean off as tolerated - Albuterol as needed - Continue to monitor  Anemia - Hemoglobin improved from discharge - Currently 9.0 - Previous iron studies showed iron deficiency - continue home iron, add vitamin C for better iron absorption   GERD - Continue PPI  Depression - Continue Celexa and Remeron   DVT prophylaxis: SQ heparin Code Status: Full  Family Communication:  Disposition: Status is: Inpatient Remains inpatient appropriate because: IV diuresis with furosemide   Consultants:   Procedures:   Antimicrobials:    Subjective: Pt says she has been drinking a lot of liquids at home but working to restrict more.  Objective: Vitals:   11/19/22 0500 11/19/22 0819 11/19/22 1131 11/19/22 1416  BP:  (!) 128/103  133/73  Pulse:  (!) 51  (!) 53  Resp:  18  18  Temp:  97.9 F (36.6 C)  97.8 F (36.6 C)  TempSrc:  Oral  Oral  SpO2:  95%  95%  Weight: 102.6 kg  100.2 kg   Height:        Intake/Output Summary (Last 24 hours) at 11/19/2022 1417 Last data filed at 11/19/2022 1416 Gross per 24 hour  Intake --  Output 1700 ml  Net -1700 ml   Filed Weights   11/18/22 1515 11/19/22 0500 11/19/22 1131   Weight: 103.9 kg 102.6 kg 100.2 kg   Examination:  General exam: Appears calm and comfortable  Respiratory system: bibasilar crackles.  Respiratory effort normal. Cardiovascular system: normal S1 & S2 heard. No JVD, murmurs, rubs, gallops or clicks. No pedal edema. Gastrointestinal system: Abdomen is nondistended, soft and nontender. No organomegaly or masses felt. Normal bowel sounds heard. Central nervous system: Alert and oriented. No focal neurological deficits. Extremities: Symmetric 5 x 5 power. Skin: No rashes, lesions or ulcers. Psychiatry: Judgement and insight appear normal. Mood & affect appropriate.   Data Reviewed: I have personally reviewed following labs and imaging studies  CBC: Recent Labs  Lab 11/18/22 1828 11/19/22 0642  WBC 8.0 7.1  NEUTROABS 4.7 3.5  HGB 9.0* 9.3*  HCT 31.3* 32.9*  MCV 90.7 91.1  PLT 275 248    Basic Metabolic Panel: Recent Labs  Lab 11/18/22 1828 11/19/22 0445  NA 137 141  K 4.2 3.5  CL 96* 97*  CO2 31 32  GLUCOSE 121* 112*  BUN 17 18  CREATININE 0.98 0.90  CALCIUM 8.7* 8.9  MG  --  2.0    CBG: No results for input(s): "GLUCAP" in the last 168 hours.  Recent Results (from the past 240 hour(s))  SARS Coronavirus 2 by RT PCR (hospital order, performed in [facility] hospital lab) *cepheid single result test* Anterior Nasal Swab     Status: None   Collection Time: 11/18/22  7:48 PM   Specimen: Anterior Nasal Swab  Result Value Ref Range Status   SARS Coronavirus 2 by RT PCR NEGATIVE NEGATIVE Final    Comment: (NOTE) SARS-CoV-2 target nucleic acids are NOT DETECTED.  The SARS-CoV-2 RNA is generally detectable in upper and lower respiratory specimens during the acute phase of infection. The lowest concentration of SARS-CoV-2 viral copies this assay can detect is 250 copies / mL. A negative result does not preclude SARS-CoV-2 infection and should not be used as the sole basis for treatment or other patient management  decisions.  A negative result may occur with improper specimen collection / handling, submission of specimen other than nasopharyngeal swab, presence of viral mutation(s) within the areas targeted by this assay, and inadequate number of viral copies (<250 copies / mL). A negative result must be combined with clinical observations, patient history, and epidemiological information.  Fact Sheet for Patients:   RoadLapTop.co.za  Fact Sheet for Healthcare Providers: http://kim-miller.com/  This test is not yet approved or  cleared by the [place] FDA and has been authorized for detection and/or diagnosis of SARS-CoV-2 by FDA under an Emergency Use Authorization (EUA).  This EUA will remain in effect (meaning this test can be used) for the duration of the COVID-19 declaration under Section 564(b)(1) of the Act, 21 U.S.C. section 360bbb-3(b)(1), unless the authorization is terminated or revoked  sooner.  Performed at [facility], [address] [place], [place] [address]      Radiology Studies: DG Chest 2 View  Result Date: 11/18/2022 CLINICAL DATA:  Dyspnea, swelling. EXAM: CHEST - 2 VIEW COMPARISON:  11/06/2022. FINDINGS: Unchanged diffuse interstitial prominence with patchy opacities in the left-greater-than-right lung bases. Stable cardiac and mediastinal contours. No pleural effusion or pneumothorax. IMPRESSION: Unchanged diffuse interstitial prominence with patchy opacities in the left-greater-than-right lung bases. These findings may reflect pulmonary edema versus atypical/viral infection. Again, radiographic follow-up to resolution is recommended versus further characterization with chest CT. Electronically Signed   By: Orvan Falconer M.D.   On: 11/18/2022 17:23    Scheduled Meds:  [START ON 11/21/2022] aspirin EC  81 mg Oral Once per day on Mon Thu   buprenorphine-naloxone  1 tablet Sublingual Daily   citalopram  40 mg Oral Daily    empagliflozin  10 mg Oral Daily   furosemide  40 mg Intravenous BID   heparin  5,000 Units Subcutaneous Q8H   iron polysaccharides  150 mg Oral Daily   metoprolol tartrate  50 mg Oral BID   pantoprazole  40 mg Oral Daily   Continuous Infusions:   LOS: 0 days   Time spent: 36 mins  Vernice Bowker Laural Benes, MD How to contact the [facility] Attending or Consulting provider 7A - 7P or covering provider during after hours 7P -7A, for this patient?  Check the care team in [facility] and look for a) attending/consulting TRH provider listed and b) the [facility] team listed Log into [email or web address] and use [place]'s universal password to access. If you do not have the password, please contact the hospital operator. Locate the [facility] provider you are looking for under [facility] and page to a number that you can be directly reached. If you still have difficulty reaching the provider, please page the [facility] (Director on Call) for the Hospitalists listed on amion for assistance.  11/19/2022, 2:17 PM

## 2022-11-19 NOTE — Consult Note (Addendum)
Cardiology Consultation   Patient ID: [name] MRN: [id number]; DOB: [birth year]  Admit date: 11/18/2022 Date of Consult: 11/19/2022  PCP:  Oneita Hurt No   [facility] Providers Cardiologist:  Marjo Bicker, MD        Patient Profile:   [name] is a 52 y.o. female with a hx of HTN, Anemia, GERD, depression and history of substance use (on Suboxone) who is being seen 11/19/2022 for the evaluation of CHF at the request of Dr. Carren Rang.  History of Present Illness:   [name] was recently admitted to [facility] from 4/17 - 11/11/2022 for acute hypoxic respiratory failure in the setting of acute bronchitis, CAP and a CHF exacerbation. She responded well to IV Lasix during admission and was transitioned to Lasix 40 mg daily at the time of discharge. Weight at the time of discharge was 223 lbs. Cardiology was consulted during admission for consideration of a TEE as her transthoracic echocardiogram showed a preserved EF of greater than 75% but her mitral valve was abnormal with moderate to severe MR and vegetation on the anterior leaflet could not be excluded. TEE was performed on 11/11/2022 and showed mild to moderate MR with no echodensity along the mitral valve. Was also found to be anemic during admission and received IV iron (Ferritin 12 during admission) and was recommend to have an EGD and colonoscopy as an outpatient.  She did follow-up with Dr. Jenene Slicker on 11/18/2022 and reported worsening dyspnea on exertion, orthopnea, PND and lower extremity edema for the past few days. Was hypoxic while in the office with saturations in the 80's on room air. Weight had trended up to 229 lbs. She was sent to the emergency department for likely admission for IV diuresis and possible GI evaluation.  Initial labs showed WBC 8.0, Hgb 9.0 (previously at 8.4 on 11/08/2022), platelets 275, Na+ 137, K+ 4.2 and creatinine 0.98.  BNP elevated at 544. Initial and repeat Hs  Troponin negative at 5 and 6. Negative for COVID. CXR showed unchanged diffuse interstitial prominence with patchy opacities in the left greater than right lung bases which may reflect pulmonary edema versus atypical/viral infection. EKG showed NSR, HR 61 with no acute ST changes.  She was started on IV Lasix 40 mg twice daily with I's and O's have not yet been recorded. Weight is listed at 226 lbs this morning so down 3 lbs thus far. Repeat labs show her creatinine remains stable at 0.90. She reports her dyspnea has started to improve slightly as she was able to sleep last night. Still having dyspnea with minimal exertion. No associated chest pain or palpitations. Denies any significant changes in her dietary habits. Consumes 40-50 ounces of soda daily.     Past Medical History:  Diagnosis Date   Anxiety    Arthritis    Cancer (HCC)    Cervical cancer (HCC)    Depressed    Drug addiction (HCC) Hep C   Foot abscess, left    Gastroesophageal reflux disease    Gastroparesis    Hepatitis C    pt denies any treatment   Hypertension    Polysubstance dependence including opioid type drug, episodic abuse (HCC)    Psoriasis    Renal disorder    kidney stones    Past Surgical History:  Procedure Laterality Date   APPENDECTOMY     APPLICATION OF WOUND VAC Left 10/10/2017   Procedure: APPLICATION OF WOUND VAC;  Surgeon: Nadara Mustard,  MD;  Location: [facility] OR;  Service: Orthopedics;  Laterality: Left;   ESOPHAGOGASTRODUODENOSCOPY  01/04/2008   Dr Teddy Spike reflux esophagitis, sm HH, gastritis, BRAVO positive for GERD on BID PPI   I & D EXTREMITY Left 10/03/2017   Procedure: IRRIGATION AND DEBRIDEMENT EXTREMITY;  Surgeon: Nadara Mustard, MD;  Location: [facility] OR;  Service: Orthopedics;  Laterality: Left;   IRRIGATION AND DEBRIDEMENT FOOT Left 10/03/2017   LASER ABLATION CONDYLOMA CERVICAL / VULVAR     mandible surgery for fracture     SKIN SPLIT GRAFT Left 10/10/2017   Procedure: SKIN GRAFT SPLIT  THICKNESS LEFT FOOT;  Surgeon: Nadara Mustard, MD;  Location: [facility] OR;  Service: Orthopedics;  Laterality: Left;   TEE WITHOUT CARDIOVERSION N/A 11/11/2022   Procedure: TRANSESOPHAGEAL ECHOCARDIOGRAM (TEE);  Surgeon: Marjo Bicker, MD;  Location: [facility] ORS;  Service: Cardiovascular;  Laterality: N/A;   TUBAL LIGATION       Home Medications:  Prior to Admission medications   Medication Sig Start Date End Date Taking? Authorizing Provider  acetaminophen (TYLENOL) 325 MG tablet Take 650 mg by mouth every 6 (six) hours as needed for moderate pain.   Yes [provider]  aspirin EC 81 MG tablet Take 81 mg by mouth 2 (two) times a week.    Yes [provider]  Aspirin-Acetaminophen-Caffeine (GOODYS EXTRA STRENGTH PO) Take 1 Package by mouth daily as needed (pain).   Yes [provider]  Buprenorphine HCl-Naloxone HCl 8-2 MG FILM Place under the tongue 2 (two) times daily. 11/04/22  Yes [provider]  citalopram (CELEXA) 40 MG tablet Take 40 mg by mouth daily.   Yes [provider]  furosemide (LASIX) 40 MG tablet Take 1 tablet (40 mg total) by mouth daily. 11/12/22 12/12/22 Yes Shah, Pratik D, DO  ibuprofen (ADVIL,MOTRIN) 200 MG tablet Take 800 mg by mouth every 6 (six) hours as needed for moderate pain.   Yes [provider]  Ipratropium-Albuterol (COMBIVENT RESPIMAT) 20-100 MCG/ACT AERS respimat Inhale 1 puff into the lungs every 6 (six) hours as needed for wheezing or shortness of breath. 11/11/22  Yes Sherryll Burger, Pratik D, DO  metoprolol tartrate (LOPRESSOR) 50 MG tablet Take 50 mg by mouth 2 (two) times daily.   Yes [provider]  mirtazapine (REMERON) 15 MG tablet Take 15 mg by mouth at bedtime as needed (depression).   Yes [provider]  omeprazole (PRILOSEC) 20 MG capsule Take 40 mg by mouth daily.   Yes [provider]  potassium chloride (KLOR-CON M) 10 MEQ tablet Take 1 tablet (10 mEq total) by mouth 2 (two) times  daily. 11/11/22 12/11/22 Yes Shah, Pratik D, DO  guaiFENesin-dextromethorphan (ROBITUSSIN DM) 100-10 MG/5ML syrup Take 5 mLs by mouth every 4 (four) hours as needed for cough. Patient not taking: Reported on 11/18/2022 11/11/22   Maurilio Lovely D, DO    Inpatient Medications: Scheduled Meds:  [START ON 11/21/2022] aspirin EC  81 mg Oral Once per day on Mon Thu   buprenorphine-naloxone  1 tablet Sublingual Daily   citalopram  40 mg Oral Daily   furosemide  40 mg Intravenous BID   heparin  5,000 Units Subcutaneous Q8H   iron polysaccharides  150 mg Oral Daily   metoprolol tartrate  50 mg Oral BID   pantoprazole  40 mg Oral Daily   pneumococcal 20-valent conjugate vaccine  0.5 mL Intramuscular Tomorrow-1000   Continuous Infusions:  PRN Meds: acetaminophen **OR** acetaminophen, albuterol, ipratropium-albuterol, mirtazapine, morphine injection, ondansetron **  OR** ondansetron (ZOFRAN) IV, oxyCODONE  Allergies:    Allergies  Allergen Reactions   Tramadol Hcl Anaphylaxis, Hives, Itching and Swelling    Of tongue   Ketorolac Nausea And Vomiting, Rash and Other (See Comments)   Propoxyphene Rash    Social History:   Social History   Socioeconomic History   Marital status: Divorced    Spouse name: Not on file   Number of children: 2   Years of education: Not on file   Highest education level: Not on file  Occupational History   Occupation: disabled    Employer: UNEMPLOYED  Tobacco Use   Smoking status: Every Day    Packs/day: 1.00    Years: 28.00    Additional pack years: 0.00    Total pack years: 28.00    Types: Cigarettes   Smokeless tobacco: Never  Vaping Use   Vaping Use: Never used  Substance and Sexual Activity   Alcohol use: No    Comment: occ ( denies 3/ 21/19)   Drug use: Yes    Types: Cocaine, IV, "Crack" cocaine, Marijuana, Opium    Comment: herione last week. ( denies 10/10/17)   Sexual activity: Not Currently    Birth control/protection: None, Surgical  Other  Topics Concern   Not on file  Social History Narrative   Lives w/ mother & 2 children (17 &13)   Social Determinants of Health   Financial Resource Strain: Not on file  Food Insecurity: No Food Insecurity (11/18/2022)   Hunger Vital Sign    Worried About Running Out of Food in the Last Year: Never true    Ran Out of Food in the Last Year: Never true  Transportation Needs: No Transportation Needs (11/18/2022)   PRAPARE - Administrator, Civil Service (Medical): No    Lack of Transportation (Non-Medical): No  Physical Activity: Not on file  Stress: Not on file  Social Connections: Not on file  Intimate Partner Violence: Not At Risk (11/18/2022)   Humiliation, Afraid, Rape, and Kick questionnaire    Fear of Current or Ex-Partner: No    Emotionally Abused: No    Physically Abused: No    Sexually Abused: No    Family History:    Family History  Problem Relation Age of Onset   Hyperlipidemia Mother    Diabetes Mother    Hypertension Mother    Hypertension Sister    Hepatitis C Sister    Hypertension Father    Colon cancer Neg Hx    Liver disease Neg Hx      ROS:  Please see the history of present illness.   All other ROS reviewed and negative.     Physical Exam/Data:   Vitals:   11/18/22 2118 11/19/22 0300 11/19/22 0500 11/19/22 0819  BP: 131/71 (!) 155/95  (!) 128/103  Pulse: 62 (!) 54  (!) 51  Resp: 20 18  18   Temp: 98.3 F (36.8 C) 98.3 F (36.8 C)  97.9 F (36.6 C)  TempSrc: Oral Oral  Oral  SpO2: 95% 94%  95%  Weight:   102.6 kg   Height:       No intake or output data in the 24 hours ending 11/19/22 0910    11/19/2022    5:00 AM 11/18/2022    3:15 PM 11/18/2022    1:45 PM  Last 3 Weights  Weight (lbs) 226 lb 3.1 oz 229 lb 0.9 oz 229 lb  Weight (kg) 102.6 kg 103.9 kg  103.874 kg     Body mass index is 41.37 kg/m.  General:  Well nourished, well developed female appearing in no acute distress.  HEENT: normal Neck: no JVD Vascular: No  carotid bruits; Distal pulses 2+ bilaterally Cardiac:  normal S1, S2; RRR; no murmur  Lungs: rales along bases bilaterally.  Abd: soft, nontender, no hepatomegaly  Ext: no pitting edema Musculoskeletal:  No deformities, BUE and BLE strength normal and equal Skin: warm and dry  Neuro:  CNs 2-12 intact, no focal abnormalities noted Psych:  Normal affect   EKG:  The EKG was personally reviewed and demonstrates: NSR, HR 61 with no acute ST changes.  Telemetry:  Telemetry was personally reviewed and demonstrates: Sinus rhythm, HR in 50's to 60's. Brief episodes of bradycardia overnight with HR in the 40's.   Relevant CV Studies:  Echocardiogram: 10/2022 IMPRESSIONS     1. Left ventricular ejection fraction, by estimation, is >75%. The left  ventricle has hyperdynamic function. The left ventricle has no regional  wall motion abnormalities. Left ventricular diastolic parameters are  consistent with Grade II diastolic  dysfunction (pseudonormalization).   2. Right ventricular systolic function is normal. The right ventricular  size is mildly enlarged. There is severely elevated pulmonary artery  systolic pressure. The estimated right ventricular systolic pressure is  62.3 mmHg.   3. Left atrial size was moderately dilated.   4. The mitral valve is abnormal, moderately thickened and with mildly  restricted motion. Moderate to severe mitral valve regurgitation. Cannot  exclude vegetation in region of anterior leaflet (images 41, 44, 53).  Suggest TEE for further evaluation   5. The aortic valve was not well visualized. Aortic valve regurgitation  is not visualized. Aortic valve mean gradient measures 6.0 mmHg.   6. The inferior vena cava is dilated in size with <50% respiratory  variability, suggesting right atrial pressure of 15 mmHg.   Comparison(s): No prior Echocardiogram.    TEE: 10/2022 IMPRESSIONS     1. Left ventricular ejection fraction, by estimation, is 60 to 65%. The   left ventricle has normal function. Left ventricular diastolic function  could not be evaluated.   2. The mitral valve is normal in structure with normal leaflet mobility.  Mild to moderate mitral valve regurgitation with no evidence of systolic  pulmonary vein flow reversal. No mitral stenosis. No echodensity on the  mitral valve.   3. The aortic valve is tricuspid. Aortic valve regurgitation is not  visualized. No aortic stenosis is present.   4. Left atrial size was moderately dilated. No left atrial/left atrial  appendage thrombus was detected. The LAA emptying velocity was 87 cm/s.   Laboratory Data:  High Sensitivity Troponin:   Recent Labs  Lab 11/06/22 1711 11/06/22 2114 11/18/22 1828 11/18/22 2014  TROPONINIHS 4 3 6 5      Chemistry Recent Labs  Lab 11/18/22 1828 11/19/22 0445  NA 137 141  K 4.2 3.5  CL 96* 97*  CO2 31 32  GLUCOSE 121* 112*  BUN 17 18  CREATININE 0.98 0.90  CALCIUM 8.7* 8.9  MG  --  2.0  GFRNONAA >60 >60  ANIONGAP 10 12    Recent Labs  Lab 11/18/22 1828 11/19/22 0445  PROT 6.9 6.4*  ALBUMIN 3.3* 3.0*  AST 39 27  ALT 37 31  ALKPHOS 57 53  BILITOT 0.7 0.3   Lipids No results for input(s): "CHOL", "TRIG", "HDL", "LABVLDL", "LDLCALC", "CHOLHDL" in the last 168 hours.  Hematology Recent Labs  Lab 11/18/22 1828 11/19/22 0642  WBC 8.0 7.1  RBC 3.45* 3.61*  HGB 9.0* 9.3*  HCT 31.3* 32.9*  MCV 90.7 91.1  MCH 26.1 25.8*  MCHC 28.8* 28.3*  RDW 19.8* 19.7*  PLT 275 248   Thyroid No results for input(s): "TSH", "FREET4" in the last 168 hours.  BNP Recent Labs  Lab 11/18/22 1828  BNP 544.0*    DDimer No results for input(s): "DDIMER" in the last 168 hours.   Radiology/Studies:  DG Chest 2 View  Result Date: 11/18/2022 CLINICAL DATA:  Dyspnea, swelling. EXAM: CHEST - 2 VIEW COMPARISON:  11/06/2022. FINDINGS: Unchanged diffuse interstitial prominence with patchy opacities in the left-greater-than-right lung bases. Stable  cardiac and mediastinal contours. No pleural effusion or pneumothorax. IMPRESSION: Unchanged diffuse interstitial prominence with patchy opacities in the left-greater-than-right lung bases. These findings may reflect pulmonary edema versus atypical/viral infection. Again, radiographic follow-up to resolution is recommended versus further characterization with chest CT. Electronically Signed   By: Orvan Falconer M.D.   On: 11/18/2022 17:23     Assessment and Plan:   1. Acute HFpEF - Admitted from the office for an acute CHF exacerbation and initial labs showed BNP was elevated to 544 and CXR showed interstitial prominence is along both lung bases which may reflect pulmonary edema versus infection. - Her prior discharge weight was 223 lbs and she is at 226 lbs today. Will continue with IV Lasix 40 mg twice daily. Follow I&O's along with daily weights. Would benefit from the addition of an SGLT2 inhibitor prior to discharge. No indication to repeat an echocardiogram as one was just obtained earlier this month.   2. Mitral Regurgitation - Mild to moderate by recent TEE. Continue to follow as an outpatient.   3. Anemia - Felt to be most consistent with iron-deficiency anemia. Hgb at 9.3 today. Remains on iron supplementation.   4. HTN - Was discharged on Amlodipine 10mg  daily and Lopressor 50mg  BID earlier this month but Amlodipine was not listed on her home meds. She just received Lopressor this morning and given her baseline HR in the 50's to 60's, would not further titrate. Can restart Amlodipine pending BP trend.    For questions or updates, please contact [facility] Please consult [email or web address] for contact info under    Signed, Ellsworth Lennox, PA-C  11/19/2022 9:10 AM  Attending note Patient seen and discussed with PA Iran Ouch, I agree with her documentation. 52 yo female history of HTN, Fe deficient anemia, chronic HFpE, mild to moderate MR presented from clinic with DOE,  orthopnea, PND, bilateral LE edema.    K 4.2 BUN 17 Cr 0.98 WBC 8 Hgb 9 Plt 275 BNP 544 Trop 6-->5 CXR diffuse interstitial prominence, edema vs atypical infection.  EKG NSR  10/2022 echo: LVE>75%, grade II dd, normal RV though though mildly enlarged, severe pulm HTN 62, mod to severe MR,  10/2022 TEE: Mild to moderate mitral valve regurgitation    1.Acute on chronic HFpEF - 10/2022 echo: LVE>75%, grade II dd, normal RV though though mildly enlarged - CXR probable pulmonary edema, BNP 544 - She is on IV lasix 40mg  bid, incomplete I/Os thus far. Downtrend in Cr with diuresis consistent with venous congestion and HF. - continue IV diuresis -start jardiance 10mg  daily in setting of HFpEF  2. Anemia - per primary team   Dina Rich MD

## 2022-11-19 NOTE — Progress Notes (Signed)
   11/19/22 0819  Vitals  Temp 97.9 F (36.6 C)  Temp Source Oral  BP (!) 128/103  MAP (mmHg) 111  BP Location Left Arm  BP Method Automatic  Patient Position (if appropriate) Lying  Pulse Rate (!) 51  Pulse Rate Source Dinamap  Resp 18  Level of Consciousness  Level of Consciousness Alert  MEWS COLOR  MEWS Score Color Green  Oxygen Therapy  SpO2 95 %  O2 Device Room Air  Pain Assessment  Pain Scale 0-10  Pain Score 0  MEWS Score  MEWS Temp 0  MEWS Systolic 0  MEWS Pulse 0  MEWS RR 0  MEWS LOC 0  MEWS Score 0  Provider Notification  Provider Name/Title Dr Laural Benes  Date Provider Notified 11/19/22  Time Provider Notified [phone]  Method of Notification Page;Face-to-face  Notification Reason Other (Comment) (B/P 128/103)  Provider response No new orders  Date of Provider Response 11/19/22  Time of Provider Response [phone]

## 2022-11-19 NOTE — Telephone Encounter (Signed)
Patient Advocate Encounter   Received notification that prior authorization for Jardiance 10MG  tablets is required.   PA submitted on 11/19/2022 Key [id number] Insurance [facility] Medicaid of [facility] Electronic Prior Authorization Request Form Status is pending       Roland Earl, CPhT Pharmacy Patient Advocate Specialist [facility] Patient Advocate Team Direct Number: [phone]  Fax: [phone]

## 2022-11-19 NOTE — TOC Benefit Eligibility Note (Signed)
Patient Product/process development scientist completed.    The patient is currently admitted and upon discharge could be taking Farxiga 10 mg.  Requires Prior Authorization  The patient is currently admitted and upon discharge could be taking Jardiance 10 mg.  Requires Prior Authorization  The patient is insured through [employer]   This test claim was processed through [facility]- copay amounts may vary at other pharmacies due to pharmacy/plan contracts, or as the patient moves through the different stages of their insurance plan.  Roland Earl, CPHT Pharmacy Patient Advocate Specialist [facility] Patient Advocate Team Direct Number: [phone]  Fax: [phone]

## 2022-11-19 NOTE — Telephone Encounter (Signed)
Patient Advocate Encounter  Prior Authorization for Jardiance 10MG  tablets  has been approved.    PA# [id number] Insurance [facility] Medicaid of [facility] Electronic Prior Authorization Request Form  Effective dates: 11/19/2022 through 11/19/2023  Patients co-pay is $4.00.     Roland Earl, CPhT Pharmacy Patient Advocate Specialist [facility] Patient Advocate Team Direct Number: [phone]  Fax: [phone]

## 2022-11-20 DIAGNOSIS — J9601 Acute respiratory failure with hypoxia: Secondary | ICD-10-CM | POA: Diagnosis not present

## 2022-11-20 DIAGNOSIS — F1920 Other psychoactive substance dependence, uncomplicated: Secondary | ICD-10-CM

## 2022-11-20 DIAGNOSIS — F1120 Opioid dependence, uncomplicated: Secondary | ICD-10-CM

## 2022-11-20 DIAGNOSIS — I5032 Chronic diastolic (congestive) heart failure: Secondary | ICD-10-CM | POA: Diagnosis not present

## 2022-11-20 DIAGNOSIS — K219 Gastro-esophageal reflux disease without esophagitis: Secondary | ICD-10-CM | POA: Diagnosis not present

## 2022-11-20 DIAGNOSIS — I5033 Acute on chronic diastolic (congestive) heart failure: Secondary | ICD-10-CM | POA: Diagnosis not present

## 2022-11-20 LAB — BASIC METABOLIC PANEL
Anion gap: 11 (ref 5–15)
BUN: 20 mg/dL (ref 6–20)
CO2: 31 mmol/L (ref 22–32)
Calcium: 9.3 mg/dL (ref 8.9–10.3)
Chloride: 96 mmol/L — ABNORMAL LOW (ref 98–111)
Creatinine, Ser: 0.92 mg/dL (ref 0.44–1.00)
GFR, Estimated: >60 mL/min (ref >60)
Glucose, Bld: 117 mg/dL — ABNORMAL HIGH (ref 70–99)
Potassium: 3.4 mmol/L — ABNORMAL LOW (ref 3.5–5.1)
Sodium: 138 mmol/L (ref 135–145)

## 2022-11-20 LAB — CBC
HCT: 35.2 % — ABNORMAL LOW (ref 36.0–46.0)
Hemoglobin: 10.2 g/dL — ABNORMAL LOW (ref 12.0–15.0)
MCH: 25.9 pg — ABNORMAL LOW (ref 26.0–34.0)
MCHC: 29 g/dL — ABNORMAL LOW (ref 30.0–36.0)
MCV: 89.3 fL (ref 80.0–100.0)
Platelets: 310 10*3/uL (ref 150–400)
RBC: 3.94 MIL/uL (ref 3.87–5.11)
RDW: 19.7 % — ABNORMAL HIGH (ref 11.5–15.5)
WBC: 9.5 10*3/uL (ref 4.0–10.5)
nRBC: 0.4 % — ABNORMAL HIGH (ref 0.0–0.2)

## 2022-11-20 LAB — MAGNESIUM: Magnesium: 2.1 mg/dL (ref 1.7–2.4)

## 2022-11-20 MED ORDER — METOPROLOL TARTRATE 25 MG PO TABS
25.0000 mg | ORAL_TABLET | Freq: Two times a day (BID) | ORAL | Status: DC
  Administered 2022-11-20 – 2022-11-21 (×3): 25 mg via ORAL
  Filled 2022-11-20 (×3): qty 1

## 2022-11-20 MED ORDER — FUROSEMIDE 10 MG/ML IJ SOLN
20.0000 mg | Freq: Once | INTRAMUSCULAR | Status: AC
  Administered 2022-11-20: 20 mg via INTRAVENOUS
  Filled 2022-11-20: qty 2

## 2022-11-20 MED ORDER — FUROSEMIDE 10 MG/ML IJ SOLN: 20.0000 mg | Freq: Once | INTRAMUSCULAR | Status: DC

## 2022-11-20 MED ORDER — BUPRENORPHINE HCL-NALOXONE HCL 2-0.5 MG SL SUBL
1.0000 | SUBLINGUAL_TABLET | Freq: Two times a day (BID) | SUBLINGUAL | Status: DC
  Administered 2022-11-20 – 2022-11-21 (×2): 1 via SUBLINGUAL
  Filled 2022-11-20 (×2): qty 1

## 2022-11-20 MED ORDER — ALPRAZOLAM 0.5 MG PO TABS
0.5000 mg | ORAL_TABLET | Freq: Three times a day (TID) | ORAL | Status: DC | PRN
  Administered 2022-11-20 – 2022-11-21 (×3): 0.5 mg via ORAL
  Filled 2022-11-20 (×3): qty 1

## 2022-11-20 MED ORDER — POTASSIUM CHLORIDE CRYS ER 20 MEQ PO TBCR
40.0000 meq | EXTENDED_RELEASE_TABLET | Freq: Every day | ORAL | Status: DC
  Administered 2022-11-20 – 2022-11-21 (×2): 40 meq via ORAL
  Filled 2022-11-20 (×2): qty 2

## 2022-11-20 MED ORDER — POTASSIUM CHLORIDE CRYS ER 20 MEQ PO TBCR
40.0000 meq | EXTENDED_RELEASE_TABLET | Freq: Once | ORAL | Status: AC
  Administered 2022-11-20: 40 meq via ORAL
  Filled 2022-11-20: qty 2

## 2022-11-20 MED ORDER — FUROSEMIDE 10 MG/ML IJ SOLN
60.0000 mg | Freq: Two times a day (BID) | INTRAMUSCULAR | Status: DC
  Administered 2022-11-20: 60 mg via INTRAVENOUS
  Filled 2022-11-20: qty 6

## 2022-11-20 NOTE — Progress Notes (Signed)
PROGRESS NOTE   [name]  ZOX:[id number] DOB: [birth year] DOA: 11/18/2022 PCP: Pcp, No   Chief Complaint  Patient presents with   Shortness of Breath   Level of care: Telemetry  Brief Admission History:   52 y.o. female with medical history significant of with history of anxiety, arthritis, depression, chronic back pain, gastroparesis, hepatitis C, hypertension, polysubstance abuse, and more presents the ED with a chief complaint of dyspnea.  Patient was just admitted to the hospital and had been discharged 1 week ago.  She reports that since she been home she was feeling okay for 2 days, and then had gradual onset of dyspnea again.  It has been progressively worse since it started.  It is worse with exertion.  She has noticed peripheral edema in her bilateral lower extremities, abdomen, and face per her report.  Patient reports she has been taking all the medication she was prescribed at discharge.  She also reports that she loves water and drinks as much as she can.  She thinks she drinks about 60 ounces of water per day in addition to soda and sweet tea.  We discussed the importance of fluid restrictions in patients with CHF.  Patient reports that she is confused because one of her doctors tells her to drink more water.  We discussed weighing herself each day to determine if she is drinking too much water and if she is starting to have fluid buildup before it becomes a problem.  Patient reports understanding.  Patient reports she has had associated cough.  Is productive of clear frothy sputum.  She denies fevers, chest pain, palpitations.  Patient has no other complaints at this time aside from chronic low back pain.    Assessment and Plan: * Acute HFpEF - Last EF was 60-65% 1 week ago - Patient presented with dyspnea on exertion, peripheral edema, pulmonary edema on chest x-ray - She initially on presentation was hypoxic down to 77% on room air - Continue aspirin, metoprolol -  Continue Lasix increased to 60 mg IV twice daily by cardiology  - Barrier to discharge is volume overload - Reds clip readings remain HIGH, continue fluid restrictions, closely monitor intake and output - Continue to monitor -- started on empagliflozin 10 mg by cardiology and pharmTech working on prior authorization  Acute hypoxic respiratory failure (HCC) - Down to 77% on room air - Normalized on 3 L nasal cannula - Likely due to acute HFpEF  - Dyspnea on exertion, peripheral edema - Chest x-ray shows pulmonary edema - Last echo was a week ago and showed an ejection fraction of 60-65% - 40 mg IV Lasix started in the ED, continue 40 mg IV Lasix twice daily - Patient recently admitted for the same was treated for CHF and community-acquired pneumonia, finished a course of antibiotics and was discharged on room air at that time - Wean off as tolerated - Albuterol as needed - Continue to monitor  Polysubstance dependence including opioid type drug, episodic abuse (HCC) -- for unknown reasons, suboxone had been ordered as once per day but pt normally takes twice daily, we have increased suboxone to twice daily like she normally takes it.   Anemia - Hemoglobin improved from discharge - Currently 9.0 - Previous iron studies showed iron deficiency - continue home iron, added vitamin C for better iron absorption   GERD - Continue PPI  Depression - Continue Celexa and Remeron   DVT prophylaxis: SQ heparin Code Status: Full  Family Communication:  Disposition: Status is: Inpatient Remains inpatient appropriate because: IV diuresis with furosemide   Consultants:   Procedures:   Antimicrobials:    Subjective: Pt says she takes her suboxone twice daily.  She still has some SOB especially with ambulation.   Objective: Vitals:   11/19/22 1131 11/19/22 1416 11/20/22 0439 11/20/22 0925  BP:  133/73 (!) 159/79 132/71  Pulse:  (!) 53 (!) 50 (!) 55  Resp:  18 18 18   Temp:  97.8 F  (36.6 C) 98 F (36.7 C) 98.2 F (36.8 C)  TempSrc:  Oral Oral Oral  SpO2:  95% 97% 95%  Weight: 100.2 kg  96.5 kg   Height:        Intake/Output Summary (Last 24 hours) at 11/20/2022 1227 Last data filed at 11/20/2022 1157 Gross per 24 hour  Intake 240 ml  Output 2500 ml  Net -2260 ml   Filed Weights   11/19/22 0500 11/19/22 1131 11/20/22 0439  Weight: 102.6 kg 100.2 kg 96.5 kg   Examination:  General exam: Appears calm and comfortable  Respiratory system: bibasilar crackles.  Respiratory effort normal. Cardiovascular system: normal S1 & S2 heard. No JVD, murmurs, rubs, gallops or clicks. No pedal edema. Gastrointestinal system: Abdomen is nondistended, soft and nontender. No organomegaly or masses felt. Normal bowel sounds heard. Central nervous system: Alert and oriented. No focal neurological deficits. Extremities: 1-2+ edema BLEs, Symmetric 5 x 5 power. Skin: No rashes, lesions or ulcers. Psychiatry: Judgement and insight appear normal. Mood & affect appropriate.   Data Reviewed: I have personally reviewed following labs and imaging studies  CBC: Recent Labs  Lab 11/18/22 1828 11/19/22 0642 11/20/22 0348  WBC 8.0 7.1 9.5  NEUTROABS 4.7 3.5  --   HGB 9.0* 9.3* 10.2*  HCT 31.3* 32.9* 35.2*  MCV 90.7 91.1 89.3  PLT 275 248 310    Basic Metabolic Panel: Recent Labs  Lab 11/18/22 1828 11/19/22 0445 11/20/22 0348  NA 137 141 138  K 4.2 3.5 3.4*  CL 96* 97* 96*  CO2 31 32 31  GLUCOSE 121* 112* 117*  BUN 17 18 20   CREATININE 0.98 0.90 0.92  CALCIUM 8.7* 8.9 9.3  MG  --  2.0 2.1    CBG: No results for input(s): "GLUCAP" in the last 168 hours.  Recent Results (from the past 240 hour(s))  SARS Coronavirus 2 by RT PCR (hospital order, performed in [facility] hospital lab) *cepheid single result test* Anterior Nasal Swab     Status: None   Collection Time: 11/18/22  7:48 PM   Specimen: Anterior Nasal Swab  Result Value Ref Range Status   SARS Coronavirus 2  by RT PCR NEGATIVE NEGATIVE Final    Comment: (NOTE) SARS-CoV-2 target nucleic acids are NOT DETECTED.  The SARS-CoV-2 RNA is generally detectable in upper and lower respiratory specimens during the acute phase of infection. The lowest concentration of SARS-CoV-2 viral copies this assay can detect is 250 copies / mL. A negative result does not preclude SARS-CoV-2 infection and should not be used as the sole basis for treatment or other patient management decisions.  A negative result may occur with improper specimen collection / handling, submission of specimen other than nasopharyngeal swab, presence of viral mutation(s) within the areas targeted by this assay, and inadequate number of viral copies (<250 copies / mL). A negative result must be combined with clinical observations, patient history, and epidemiological information.  Fact Sheet for Patients:   RoadLapTop.co.za  Fact Sheet for  Healthcare Providers: http://kim-miller.com/  This test is not yet approved or  cleared by the [place] and has been authorized for detection and/or diagnosis of SARS-CoV-2 by FDA under an Emergency Use Authorization (EUA).  This EUA will remain in effect (meaning this test can be used) for the duration of the COVID-19 declaration under Section 564(b)(1) of the Act, 21 U.S.C. section 360bbb-3(b)(1), unless the authorization is terminated or revoked sooner.  Performed at [facility], [address] [place], [place] [address]      Radiology Studies: DG Chest 2 View  Result Date: 11/18/2022 CLINICAL DATA:  Dyspnea, swelling. EXAM: CHEST - 2 VIEW COMPARISON:  11/06/2022. FINDINGS: Unchanged diffuse interstitial prominence with patchy opacities in the left-greater-than-right lung bases. Stable cardiac and mediastinal contours. No pleural effusion or pneumothorax. IMPRESSION: Unchanged diffuse interstitial prominence with patchy opacities in the  left-greater-than-right lung bases. These findings may reflect pulmonary edema versus atypical/viral infection. Again, radiographic follow-up to resolution is recommended versus further characterization with chest CT. Electronically Signed   By: Orvan Falconer M.D.   On: 11/18/2022 17:23    Scheduled Meds:  ascorbic acid  500 mg Oral Daily   [START ON 11/21/2022] aspirin EC  81 mg Oral Once per day on Mon Thu   buprenorphine-naloxone  1 tablet Sublingual BID   citalopram  40 mg Oral Daily   empagliflozin  10 mg Oral Daily   furosemide  20 mg Intravenous Once   furosemide  60 mg Intravenous BID   heparin  5,000 Units Subcutaneous Q8H   iron polysaccharides  150 mg Oral Daily   metoprolol tartrate  25 mg Oral BID   pantoprazole  40 mg Oral Daily   potassium chloride  40 mEq Oral Daily   potassium chloride  40 mEq Oral Once   Continuous Infusions:   LOS: 1 day   Time spent: 35 mins  Kristoph Sattler Laural Benes, MD How to contact the [facility] Attending or Consulting provider 7A - 7P or covering provider during after hours 7P -7A, for this patient?  Check the care team in [facility] and look for a) attending/consulting [facility] provider listed and b) the [facility] team listed Log into [email or web address] and use 's universal password to access. If you do not have the password, please contact the hospital operator. Locate the [facility] provider you are looking for under [facility] and page to a number that you can be directly reached. If you still have difficulty reaching the provider, please page the [facility] (Director on Call) for the Hospitalists listed on amion for assistance.  11/20/2022, 12:27 PM

## 2022-11-20 NOTE — Progress Notes (Signed)
Rounding Note    Patient Name: [name] Date of Encounter: 11/20/2022  [facility] Cardiologist: Marjo Bicker, MD   Subjective   SOB is improving.   Inpatient Medications    Scheduled Meds:  ascorbic acid  500 mg Oral Daily   [START ON 11/21/2022] aspirin EC  81 mg Oral Once per day on Mon Thu   buprenorphine-naloxone  1 tablet Sublingual Daily   citalopram  40 mg Oral Daily   empagliflozin  10 mg Oral Daily   furosemide  40 mg Intravenous BID   heparin  5,000 Units Subcutaneous Q8H   iron polysaccharides  150 mg Oral Daily   metoprolol tartrate  50 mg Oral BID   pantoprazole  40 mg Oral Daily   potassium chloride  40 mEq Oral Daily   Continuous Infusions:  PRN Meds: acetaminophen **OR** acetaminophen, albuterol, hydrOXYzine, ipratropium-albuterol, mirtazapine, morphine injection, ondansetron **OR** ondansetron (ZOFRAN) IV, oxyCODONE   Vital Signs    Vitals:   11/19/22 0819 11/19/22 1131 11/19/22 1416 11/20/22 0439  BP: (!) 128/103  133/73 (!) 159/79  Pulse: (!) 51  (!) 53 (!) 50  Resp: 18  18 18   Temp: 97.9 F (36.6 C)  97.8 F (36.6 C) 98 F (36.7 C)  TempSrc: Oral  Oral Oral  SpO2: 95%  95% 97%  Weight:  100.2 kg  96.5 kg  Height:        Intake/Output Summary (Last 24 hours) at 11/20/2022 0923 Last data filed at 11/19/2022 2100 Gross per 24 hour  Intake --  Output 2900 ml  Net -2900 ml      11/20/2022    4:39 AM 11/19/2022   11:31 AM 11/19/2022    5:00 AM  Last 3 Weights  Weight (lbs) 212 lb 11.9 oz 221 lb 226 lb 3.1 oz  Weight (kg) 96.5 kg 100.245 kg 102.6 kg      Telemetry    Sinus brady - Personally Reviewed  ECG    N/a - Personally Reviewed  Physical Exam   GEN: No acute distress.   Neck: No JVD Cardiac: RRR, no murmurs, rubs, or gallops.  Respiratory: Clear to auscultation bilaterally. GI: Soft, nontender, non-distended  MS: No edema; No deformity. Neuro:  Nonfocal  Psych: Normal affect   Labs    High  Sensitivity Troponin:   Recent Labs  Lab 11/06/22 1711 11/06/22 2114 11/18/22 1828 11/18/22 2014  TROPONINIHS 4 3 6 5      Chemistry Recent Labs  Lab 11/18/22 1828 11/19/22 0445 11/20/22 0348  NA 137 141 138  K 4.2 3.5 3.4*  CL 96* 97* 96*  CO2 31 32 31  GLUCOSE 121* 112* 117*  BUN 17 18 20   CREATININE 0.98 0.90 0.92  CALCIUM 8.7* 8.9 9.3  MG  --  2.0 2.1  PROT 6.9 6.4*  --   ALBUMIN 3.3* 3.0*  --   AST 39 27  --   ALT 37 31  --   ALKPHOS 57 53  --   BILITOT 0.7 0.3  --   GFRNONAA >60 >60 >60  ANIONGAP 10 12 11     Lipids No results for input(s): "CHOL", "TRIG", "HDL", "LABVLDL", "LDLCALC", "CHOLHDL" in the last 168 hours.  Hematology Recent Labs  Lab 11/18/22 1828 11/19/22 0642 11/20/22 0348  WBC 8.0 7.1 9.5  RBC 3.45* 3.61* 3.94  HGB 9.0* 9.3* 10.2*  HCT 31.3* 32.9* 35.2*  MCV 90.7 91.1 89.3  MCH 26.1 25.8* 25.9*  MCHC 28.8*  28.3* 29.0*  RDW 19.8* 19.7* 19.7*  PLT 275 248 310   Thyroid No results for input(s): "TSH", "FREET4" in the last 168 hours.  BNP Recent Labs  Lab 11/18/22 1828  BNP 544.0*    DDimer No results for input(s): "DDIMER" in the last 168 hours.   Radiology    DG Chest 2 View  Result Date: 11/18/2022 CLINICAL DATA:  Dyspnea, swelling. EXAM: CHEST - 2 VIEW COMPARISON:  11/06/2022. FINDINGS: Unchanged diffuse interstitial prominence with patchy opacities in the left-greater-than-right lung bases. Stable cardiac and mediastinal contours. No pleural effusion or pneumothorax. IMPRESSION: Unchanged diffuse interstitial prominence with patchy opacities in the left-greater-than-right lung bases. These findings may reflect pulmonary edema versus atypical/viral infection. Again, radiographic follow-up to resolution is recommended versus further characterization with chest CT. Electronically Signed   By: Orvan Falconer M.D.   On: 11/18/2022 17:23    Cardiac Studies    Patient Profile     [name] is a 52 y.o. female with a hx of  HTN, Anemia, GERD, depression and history of substance use (on Suboxone) who is being seen 11/19/2022 for the evaluation of CHF at the request of Dr. Carren Rang.   Assessment & Plan    1.Acute on chronic HFpEF - 10/2022 echo: LVEF>75%, grade II dd, normal RV though though mildly enlarged - CXR probable pulmonary edema, BNP 544  - She is on IV lasix 40mg  bid, incomplete I/Os but 2.9 L uop documented.Stable renal function.  - reds vets yesterday >59% - increase lasix to 60mg  iv bid, remains fluid overloaded -started jardiance 10mg  daily in setting of HFpEF   2. Anemia - per primary team  3. Bradycardia - sinus brady 50s this AM, held AM beta blocker. Lower dose to 25mg  bid.  For questions or updates, please contact [facility] Please consult [email or web address] for contact info under        Signed, Dina Rich, MD  11/20/2022, 9:23 AM

## 2022-11-20 NOTE — Progress Notes (Signed)
Patient's heart was 55 this am, B/P 132/71,spoke with Dr Wyline Mood this am about holding Metoprolol,orders received and given, Plan of care on going.

## 2022-11-20 NOTE — Assessment & Plan Note (Signed)
--   for unknown reasons, suboxone had been ordered as once per day but pt normally takes twice daily, we have increased suboxone to twice daily like she normally takes it.

## 2022-11-20 NOTE — Progress Notes (Signed)
   11/20/22 1154  ReDS Vest / Clip  Station Marker A  Ruler Value 18  ReDS Value Range (!) > 40  ReDS Actual Value 56

## 2022-11-21 DIAGNOSIS — I5032 Chronic diastolic (congestive) heart failure: Secondary | ICD-10-CM | POA: Diagnosis not present

## 2022-11-21 DIAGNOSIS — I5033 Acute on chronic diastolic (congestive) heart failure: Secondary | ICD-10-CM | POA: Diagnosis not present

## 2022-11-21 LAB — CBC
HCT: 36.2 % (ref 36.0–46.0)
Hemoglobin: 10.2 g/dL — ABNORMAL LOW (ref 12.0–15.0)
MCH: 25 pg — ABNORMAL LOW (ref 26.0–34.0)
MCHC: 28.2 g/dL — ABNORMAL LOW (ref 30.0–36.0)
MCV: 88.7 fL (ref 80.0–100.0)
Platelets: 219 10*3/uL (ref 150–400)
RBC: 4.08 MIL/uL (ref 3.87–5.11)
RDW: 19.9 % — ABNORMAL HIGH (ref 11.5–15.5)
WBC: 9.9 10*3/uL (ref 4.0–10.5)
nRBC: 0 % (ref 0.0–0.2)

## 2022-11-21 LAB — BASIC METABOLIC PANEL
Anion gap: 13 (ref 5–15)
BUN: 22 mg/dL — ABNORMAL HIGH (ref 6–20)
CO2: 27 mmol/L (ref 22–32)
Calcium: 9.1 mg/dL (ref 8.9–10.3)
Chloride: 96 mmol/L — ABNORMAL LOW (ref 98–111)
Creatinine, Ser: 1.09 mg/dL — ABNORMAL HIGH (ref 0.44–1.00)
GFR, Estimated: >60 mL/min (ref >60)
Glucose, Bld: 120 mg/dL — ABNORMAL HIGH (ref 70–99)
Potassium: 3.5 mmol/L (ref 3.5–5.1)
Sodium: 136 mmol/L (ref 135–145)

## 2022-11-21 LAB — MAGNESIUM: Magnesium: 2.3 mg/dL (ref 1.7–2.4)

## 2022-11-21 MED ORDER — TORSEMIDE 20 MG PO TABS: 20.0000 mg | ORAL_TABLET | Freq: Every day | ORAL | 1 refills | Status: DC

## 2022-11-21 MED ORDER — EMPAGLIFLOZIN 10 MG PO TABS: 10.0000 mg | ORAL_TABLET | Freq: Every day | ORAL | 1 refills | Status: DC

## 2022-11-21 MED ORDER — METOPROLOL TARTRATE 25 MG PO TABS: 25.0000 mg | ORAL_TABLET | Freq: Two times a day (BID) | ORAL | 1 refills | Status: DC

## 2022-11-21 MED ORDER — TORSEMIDE 20 MG PO TABS
20.0000 mg | ORAL_TABLET | Freq: Every day | ORAL | Status: DC
  Administered 2022-11-21: 20 mg via ORAL
  Filled 2022-11-21: qty 1

## 2022-11-21 NOTE — Discharge Instructions (Signed)
Advised to follow-up with primary care physician in 1 week. Advised to follow-up with cardiology as scheduled. Advised to take metoprolol 25 mg twice daily Advised to take torsemide 20 mg daily for CHF. Advised to take Jardiance 10 mg daily

## 2022-11-21 NOTE — Progress Notes (Signed)
   11/21/22 0800  ReDS Vest / Clip  Station Marker A  Ruler Value 21  ReDS Value Range (!) > 40  ReDS Actual Value 53

## 2022-11-21 NOTE — Progress Notes (Addendum)
Rounding Note    Patient Name: [name] Date of Encounter: 11/21/2022  [facility] Cardiologist: Marjo Bicker, MD   Subjective   SOB has resolved  Inpatient Medications    Scheduled Meds:  ascorbic acid  500 mg Oral Daily   aspirin EC  81 mg Oral Once per day on Mon Thu   buprenorphine-naloxone  1 tablet Sublingual BID   citalopram  40 mg Oral Daily   empagliflozin  10 mg Oral Daily   furosemide  20 mg Intravenous Once   furosemide  60 mg Intravenous BID   heparin  5,000 Units Subcutaneous Q8H   iron polysaccharides  150 mg Oral Daily   metoprolol tartrate  25 mg Oral BID   pantoprazole  40 mg Oral Daily   potassium chloride  40 mEq Oral Daily   Continuous Infusions:  PRN Meds: acetaminophen **OR** acetaminophen, albuterol, ALPRAZolam, ipratropium-albuterol, mirtazapine, morphine injection, ondansetron **OR** ondansetron (ZOFRAN) IV, oxyCODONE   Vital Signs    Vitals:   11/20/22 1404 11/20/22 2100 11/21/22 0445 11/21/22 0836  BP: (!) 120/93 (!) 107/90 (!) 114/92 (!) 121/90  Pulse: (!) 57 (!) 56 61 64  Resp: 19 18 18 18   Temp: 97.9 F (36.6 C) 97.7 F (36.5 C) 98.2 F (36.8 C) 98.4 F (36.9 C)  TempSrc:  Oral Oral Oral  SpO2: 96% 96% 97% 92%  Weight:   95.8 kg   Height:        Intake/Output Summary (Last 24 hours) at 11/21/2022 0910 Last data filed at 11/21/2022 0049 Gross per 24 hour  Intake 720 ml  Output 400 ml  Net 320 ml      11/21/2022    4:45 AM 11/20/2022    4:39 AM 11/19/2022   11:31 AM  Last 3 Weights  Weight (lbs) 211 lb 3.2 oz 212 lb 11.9 oz 221 lb  Weight (kg) 95.8 kg 96.5 kg 100.245 kg      Telemetry    NSR - Personally Reviewed  ECG    N/a - Personally Reviewed  Physical Exam   GEN: No acute distress.   Neck: No JVD Cardiac: RRR, no murmurs, rubs, or gallops.  Respiratory: Clear to auscultation bilaterally. GI: Soft, nontender, non-distended  MS: No edema; No deformity. Neuro:  Nonfocal  Psych:  Normal affect   Labs    High Sensitivity Troponin:   Recent Labs  Lab 11/06/22 1711 11/06/22 2114 11/18/22 1828 11/18/22 2014  TROPONINIHS 4 3 6 5      Chemistry Recent Labs  Lab 11/18/22 1828 11/19/22 0445 11/20/22 0348 11/21/22 0442  NA 137 141 138 136  K 4.2 3.5 3.4* 3.5  CL 96* 97* 96* 96*  CO2 31 32 31 27  GLUCOSE 121* 112* 117* 120*  BUN 17 18 20  22*  CREATININE 0.98 0.90 0.92 1.09*  CALCIUM 8.7* 8.9 9.3 9.1  MG  --  2.0 2.1 2.3  PROT 6.9 6.4*  --   --   ALBUMIN 3.3* 3.0*  --   --   AST 39 27  --   --   ALT 37 31  --   --   ALKPHOS 57 53  --   --   BILITOT 0.7 0.3  --   --   GFRNONAA >60 >60 >60 >60  ANIONGAP 10 12 11 13     Lipids No results for input(s): "CHOL", "TRIG", "HDL", "LABVLDL", "LDLCALC", "CHOLHDL" in the last 168 hours.  Hematology Recent Labs  Lab 11/19/22  4098 11/20/22 0348 11/21/22 0442  WBC 7.1 9.5 9.9  RBC 3.61* 3.94 4.08  HGB 9.3* 10.2* 10.2*  HCT 32.9* 35.2* 36.2  MCV 91.1 89.3 88.7  MCH 25.8* 25.9* 25.0*  MCHC 28.3* 29.0* 28.2*  RDW 19.7* 19.7* 19.9*  PLT 248 310 219   Thyroid No results for input(s): "TSH", "FREET4" in the last 168 hours.  BNP Recent Labs  Lab 11/18/22 1828  BNP 544.0*    DDimer No results for input(s): "DDIMER" in the last 168 hours.   Radiology    No results found.  Cardiac Studies     Patient Profile     [name] is a 52 y.o. female with a hx of HTN, Anemia, GERD, depression and history of substance use (on Suboxone) who is being seen 11/19/2022 for the evaluation of CHF at the request of Dr. Carren Rang.   Assessment & Plan    1.Acute on chronic HFpEF - 10/2022 echo: LVEF>75%, grade II dd, normal RV though though mildly enlarged - CXR probable pulmonary edema, BNP 544   - She is on IV lasix 60mg  bid, I/Os are incomplete. Mild uptrend in Cr. Admit weight 229 lbs, today 211 lbs. Appears euvolemic by exam -started jardiance 10mg  daily in setting of HFpEF  - d/c IV lasix, start  oral torsemide 20mg  daily    2. Anemia - per primary team   3. Bradycardia - sinus brady 50s this AM, held AM beta blocker. Lower dose to 25mg  bid.   Ok for discharge from cardiac standpoint, we will arrange f/u.   For questions or updates, please contact [facility] Please consult [email or web address] for contact info under        Signed, Dina Rich, MD  11/21/2022, 9:10 AM

## 2022-11-21 NOTE — Discharge Summary (Signed)
Physician Discharge Summary  [name]:[id number] DOB: August 11, 1970 DOA: 11/18/2022  PCP: Pcp, No  Admit date: 11/18/2022  Discharge date: 11/21/2022  Admitted From: Home  Disposition:  Home  Recommendations for Outpatient Follow-up:  Follow up with PCP in 1-2 weeks. Please obtain BMP/CBC in one week. Advised to follow-up with Cardiology as scheduled. Advised to take metoprolol 25 mg twice daily Advised to take torsemide 20 mg daily for CHF. Advised to take Jardiance 10 mg daily  Home Health:None Equipment/Devices:None  Discharge Condition: Stable CODE STATUS:Full code Diet recommendation: Heart Healthy  Brief [facility] Course: This 52 y.o. female with medical history significant of  anxiety, arthritis, depression, chronic back pain, gastroparesis, hepatitis C, hypertension, polysubstance abuse presented in the ED with a chief complaint of dyspnea.  She was re recently discharged from hospital.  She reports has developed dyspnea.  it has been progressively worse since it started.  It is worse with exertion.  She has noticed peripheral edema in her bilateral lower extremities, abdomen, and face per her report.  Patient reports she has been taking all the medication she was prescribed at discharge.  She also reports that she loves water and drinks as much as she can.  She thinks she drinks about 60 ounces of water per day in addition to soda and sweet tea.  We discussed the importance of fluid restrictions in patients with CHF.  Patient reports that she is confused because one of her doctors tells her to drink more water.  We discussed weighing herself each day to determine if she is drinking too much water and if she is starting to have fluid buildup before it becomes a problem.  Patient reports understanding.  Patient reports she has had associated cough.   Patient was admitted for acute on chronic diastolic CHF, She was started on IV diuresis with Lasix 60 mg every 12  hours.  She was adequately diuresed.  She is euvolemic at the time of discharge.  Her shortness of breath has resolved.  Her weight is down from 222 pounds to 211 pounds.  Patient was cleared from cardiology to be discharged on torsemide 20 mg daily.  Her metoprolol dose was reduced from 50 mg to 25 mg twice daily given bradycardia.  Patient feels better and wants to be discharged.  Discharge Diagnoses:  Principal Problem:   Acute HFpEF Active Problems:   Depression   GERD   Anemia   Polysubstance dependence including opioid type drug, episodic abuse (HCC)   Acute hypoxic respiratory failure (HCC)  Acute HFpEF - Last EF was 60-65% 1 week ago - Patient presented with dyspnea on exertion, peripheral edema, pulmonary edema on chest x-ray - She initially on presentation was hypoxic down to 77% on room air - Continue aspirin, metoprolol - Continued on Lasix 60 mg IV every 12 hours. - Barrier to discharge is volume overload - Patient admits significant improvement.  Weight down from 222 pounds to 211 pounds. -- Patient started on Jardiance 10 mg daily. -  Patient is euvolemic.  Patient being discharged on torsemide 20 mg daily.   Acute hypoxic respiratory failure (HCC) - Down to 77% on room air - Normalized on 3 L nasal cannula - Likely due to acute HFpEF  - Dyspnea on exertion, peripheral edema - Chest x-ray shows pulmonary edema - Last echo was a week ago and showed an ejection fraction of 60-65% - 40 mg IV Lasix started in the ED, continue 40 mg IV Lasix twice  daily - She is weaned down to room air.  Acute hypoxia resolved.   Polysubstance dependence including opioid type drug, episodic abuse (HCC) -- for unknown reasons, suboxone had been ordered as once per day but pt normally takes twice daily, we have increased suboxone to twice daily like she normally takes it.    Anemia - Hemoglobin improved from discharge. - Currently 9.0 - Previous iron studies showed iron deficiency -  continue home iron, added vitamin C for better iron absorption    GERD - Continue PPI   Depression - Continue Celexa and Remeron    Discharge Instructions  Discharge Instructions     Ambulatory Referral for Lung Cancer Scre   Complete by: As directed    Call MD for:  difficulty breathing, headache or visual disturbances   Complete by: As directed    Call MD for:  persistant dizziness or light-headedness   Complete by: As directed    Call MD for:  persistant nausea and vomiting   Complete by: As directed    Diet - low sodium heart healthy   Complete by: As directed    Diet Carb Modified   Complete by: As directed    Discharge instructions   Complete by: As directed    Advised to follow-up with primary care physician in 1 week. Advised to follow-up with cardiology as scheduled. Advised to take metoprolol 25 mg twice daily Advised to take torsemide 20 mg daily for CHF. Advised to take Jardiance 10 mg daily   Increase activity slowly   Complete by: As directed       Allergies as of 11/21/2022       Reactions   Tramadol Hcl Anaphylaxis, Hives, Itching, Swelling   Of tongue   Ketorolac Nausea And Vomiting, Rash, Other (See Comments)   Propoxyphene Rash        Medication List     STOP taking these medications    furosemide 40 MG tablet Commonly known as: LASIX   GOODYS EXTRA STRENGTH PO   guaiFENesin-dextromethorphan 100-10 MG/5ML syrup Commonly known as: ROBITUSSIN DM       TAKE these medications    acetaminophen 325 MG tablet Commonly known as: TYLENOL Take 650 mg by mouth every 6 (six) hours as needed for moderate pain.   aspirin EC 81 MG tablet Take 81 mg by mouth 2 (two) times a week.   Buprenorphine HCl-Naloxone HCl 8-2 MG Film Place under the tongue 2 (two) times daily.   citalopram 40 MG tablet Commonly known as: CELEXA Take 40 mg by mouth daily.   Combivent Respimat 20-100 MCG/ACT Aers respimat Generic drug:  Ipratropium-Albuterol Inhale 1 puff into the lungs every 6 (six) hours as needed for wheezing or shortness of breath.   empagliflozin 10 MG Tabs tablet Commonly known as: JARDIANCE Take 1 tablet (10 mg total) by mouth daily. Start taking on: Nov 22, 2022   ibuprofen 200 MG tablet Commonly known as: ADVIL Take 800 mg by mouth every 6 (six) hours as needed for moderate pain.   metoprolol tartrate 25 MG tablet Commonly known as: LOPRESSOR Take 1 tablet (25 mg total) by mouth 2 (two) times daily. What changed:  medication strength how much to take   mirtazapine 15 MG tablet Commonly known as: REMERON Take 15 mg by mouth at bedtime as needed (depression).   omeprazole 20 MG capsule Commonly known as: PRILOSEC Take 40 mg by mouth daily.   potassium chloride 10 MEQ tablet Commonly known as: KLOR-CON  M Take 1 tablet (10 mEq total) by mouth 2 (two) times daily.   torsemide 20 MG tablet Commonly known as: DEMADEX Take 1 tablet (20 mg total) by mouth daily.        Follow-up Information     Mallipeddi, Orion Modest, MD Follow up on 12/18/2022.   Specialties: Cardiology, Internal Medicine Why: Cardiology Hospital Follow-up on 12/18/2022 at 11:20 AM. Will be at the [facility] OFFICE LOCATION! Contact information: [address] [place] [address] [phone]                Allergies  Allergen Reactions   Tramadol Hcl Anaphylaxis, Hives, Itching and Swelling    Of tongue   Ketorolac Nausea And Vomiting, Rash and Other (See Comments)   Propoxyphene Rash    Consultations: Cardiology   Procedures/Studies: DG Chest 2 View  Result Date: 11/18/2022 CLINICAL DATA:  Dyspnea, swelling. EXAM: CHEST - 2 VIEW COMPARISON:  11/06/2022. FINDINGS: Unchanged diffuse interstitial prominence with patchy opacities in the left-greater-than-right lung bases. Stable cardiac and mediastinal contours. No pleural effusion or pneumothorax. IMPRESSION: Unchanged diffuse interstitial prominence with  patchy opacities in the left-greater-than-right lung bases. These findings may reflect pulmonary edema versus atypical/viral infection. Again, radiographic follow-up to resolution is recommended versus further characterization with chest CT. Electronically Signed   By: Orvan Falconer M.D.   On: 11/18/2022 17:23   ECHO TEE  Result Date: 11/11/2022    TRANSESOPHOGEAL ECHO REPORT   Patient Name:   JENAVIE STANCZAK Niebuhr Date of Exam: 11/11/2022 Medical Rec #:  [id number]           Height:       62.0 in Accession #:    [id number]          Weight:       224.0 lb Date of Birth:  11/13/1970           BSA:          2.006 m Patient Age:    51 years            BP:           134/82 mmHg Patient Gender: F                   HR:           90 bpm. [facility] Procedure: Transesophageal Echo, Cardiac Doppler and Color Doppler Indications:    Mitral valve disease [696295]  History:        Patient has prior history of Echocardiogram examinations, most                 recent 11/07/2022. Signs/Symptoms:Dyspnea and Shortness of                 Breath; Risk Factors:Current Smoker and Dyslipidemia.                 Polysubstance abuse. Cancer.  Sonographer:    Celesta Gentile RCS Referring Phys: [id number] Ellsworth Lennox PROCEDURE: The transesophogeal probe was passed without difficulty through the esophogus of the patient. Imaged were obtained with the patient in a left lateral decubitus position. Sedation performed by different physician. The patient was monitored while under deep sedation. Image quality was good. The patient's vital signs; including heart rate, blood pressure, and oxygen saturation; remained stable throughout the procedure. Supplementary images were obtained from transthoracic windows as indicated to answer the clinical question. The patient developed no complications during the procedure.  IMPRESSIONS  1. Left ventricular ejection fraction, by estimation, is 60 to 65%. The left ventricle has normal  function. Left ventricular diastolic function could not be evaluated.  2. The mitral valve is normal in structure with normal leaflet mobility. Mild to moderate mitral valve regurgitation with no evidence of systolic pulmonary vein flow reversal. No mitral stenosis. No echodensity on the mitral valve.  3. The aortic valve is tricuspid. Aortic valve regurgitation is not visualized. No aortic stenosis is present.  4. Left atrial size was moderately dilated. No left atrial/left atrial appendage thrombus was detected. The LAA emptying velocity was 87 cm/s. FINDINGS  Left Ventricle: Left ventricular ejection fraction, by estimation, is 60 to 65%. The left ventricle has normal function. The left ventricular internal cavity size was normal in size. There is no left ventricular hypertrophy. Left ventricular diastolic function could not be evaluated. Right Ventricle: The right ventricular size is not well visualized. Right vetricular wall thickness was not assessed. Right ventricular systolic function was not well visualized. Left Atrium: Left atrial size was moderately dilated. No left atrial/left atrial appendage thrombus was detected. The LAA emptying velocity was 87 cm/s. Right Atrium: Right atrial size was normal in size. Pericardium: There is no evidence of pericardial effusion. Mitral Valve: No echodensity on the mitral valve. The mitral valve is normal in structure. Normal mobility of the mitral valve leaflets. Mild to moderate mitral valve regurgitation. No evidence of mitral valve stenosis. Tricuspid Valve: The tricuspid valve is normal in structure. Tricuspid valve regurgitation is trivial. No evidence of tricuspid stenosis. Aortic Valve: The aortic valve is tricuspid. Aortic valve regurgitation is not visualized. No aortic stenosis is present. Pulmonic Valve: The pulmonic valve was grossly normal. Pulmonic valve regurgitation is trivial. No evidence of pulmonic stenosis. Aorta: Aortic root could not be assessed.  Venous: A normal flow pattern is recorded from the left upper pulmonary vein, the left lower pulmonary vein and the right upper pulmonary vein. The inferior vena cava was not well visualized. IAS/Shunts: No atrial level shunt detected by color flow Doppler. Additional Comments: Spectral Doppler performed. Vishnu Priya Mallipeddi Electronically signed by Winfield Rast Mallipeddi Signature Date/Time: 11/11/2022/3:50:19 PM    Final    ECHOCARDIOGRAM COMPLETE  Result Date: 11/07/2022    ECHOCARDIOGRAM REPORT   Patient Name:   ELISABELLA HACKER Mccubbins Date of Exam: 11/07/2022 Medical Rec #:  [id number]           Height:       62.0 in Accession #:    [id number]          Weight:       221.3 lb Date of Birth:  07/27/1970           BSA:          1.996 m Patient Age:    51 years            BP:           126/84 mmHg Patient Gender: F                   HR:           67 bpm. [facility] Procedure: 2D Echo, Cardiac Doppler and Color Doppler Indications:    I50.40* Unspecified combined systolic (congestive) and diastolic                 (congestive) heart failure  History:        Patient has no prior history  of Echocardiogram examinations.                 Signs/Symptoms:Shortness of Breath and Dyspnea; Risk                 Factors:Current Smoker and Dyslipidemia. Polysubstance abuse.                 Cancer.  Sonographer:    Sheralyn Boatman RDCS Referring Phys: [id number] OLADAPO ADEFESO  Sonographer Comments: Technically difficult study due to poor echo windows, suboptimal parasternal window and patient is obese. Image acquisition challenging due to patient body habitus. IMPRESSIONS  1. Left ventricular ejection fraction, by estimation, is >75%. The left ventricle has hyperdynamic function. The left ventricle has no regional wall motion abnormalities. Left ventricular diastolic parameters are consistent with Grade II diastolic dysfunction (pseudonormalization).  2. Right ventricular systolic function is normal. The right  ventricular size is mildly enlarged. There is severely elevated pulmonary artery systolic pressure. The estimated right ventricular systolic pressure is 62.3 mmHg.  3. Left atrial size was moderately dilated.  4. The mitral valve is abnormal, moderately thickened and with mildly restricted motion. Moderate to severe mitral valve regurgitation. Cannot exclude vegetation in region of anterior leaflet (images 41, 44, 53). Suggest TEE for further evaluation  5. The aortic valve was not well visualized. Aortic valve regurgitation is not visualized. Aortic valve mean gradient measures 6.0 mmHg.  6. The inferior vena cava is dilated in size with <50% respiratory variability, suggesting right atrial pressure of 15 mmHg. Comparison(s): No prior Echocardiogram. FINDINGS  Left Ventricle: Left ventricular ejection fraction, by estimation, is >75%. The left ventricle has hyperdynamic function. The left ventricle has no regional wall motion abnormalities. The left ventricular internal cavity size was normal in size. There is no left ventricular hypertrophy. Left ventricular diastolic parameters are consistent with Grade II diastolic dysfunction (pseudonormalization). Right Ventricle: The right ventricular size is mildly enlarged. No increase in right ventricular wall thickness. Right ventricular systolic function is normal. There is severely elevated pulmonary artery systolic pressure. The tricuspid regurgitant velocity is 3.44 m/s, and with an assumed right atrial pressure of 15 mmHg, the estimated right ventricular systolic pressure is 62.3 mmHg. Left Atrium: Left atrial size was moderately dilated. Right Atrium: Right atrial size was normal in size. Pericardium: There is no evidence of pericardial effusion. Presence of epicardial fat layer. Mitral Valve: 41 44 53. The mitral valve is abnormal. There is moderate thickening of the mitral valve leaflet(s). Moderate to severe mitral valve regurgitation. MV peak gradient, 14.3  mmHg. The mean mitral valve gradient is 5.0 mmHg. Tricuspid Valve: The tricuspid valve is grossly normal. Tricuspid valve regurgitation is mild. Aortic Valve: The aortic valve was not well visualized. Aortic valve regurgitation is not visualized. Aortic valve mean gradient measures 6.0 mmHg. Aortic valve peak gradient measures 11.7 mmHg. Aortic valve area, by VTI measures 3.59 cm. Pulmonic Valve: The pulmonic valve was not well visualized. Pulmonic valve regurgitation is not visualized. Aorta: The aortic root is normal in size and structure. Venous: The inferior vena cava is dilated in size with less than 50% respiratory variability, suggesting right atrial pressure of 15 mmHg. IAS/Shunts: No atrial level shunt detected by color flow Doppler.  LEFT VENTRICLE PLAX 2D LVIDd:         4.90 cm   Diastology LVIDs:         3.50 cm   LV e' medial:  7.46 cm/s LV PW:  1.00 cm   LV e' lateral: 7.34 cm/s LV IVS:        0.80 cm LVOT diam:     2.20 cm LV SV:         124 LV SV Index:   62 LVOT Area:     3.80 cm  RIGHT VENTRICLE             IVC RV S prime:     14.80 cm/s  IVC diam: 2.70 cm TAPSE (M-mode): 3.0 cm LEFT ATRIUM             Index        RIGHT ATRIUM           Index LA diam:        3.10 cm 1.55 cm/m   RA Area:     14.80 cm LA Vol (A2C):   42.5 ml 21.29 ml/m  RA Volume:   33.00 ml  16.53 ml/m LA Vol (A4C):   98.0 ml 49.10 ml/m LA Biplane Vol: 70.4 ml 35.27 ml/m  AORTIC VALVE AV Area (Vmax):    3.51 cm AV Area (Vmean):   3.31 cm AV Area (VTI):     3.59 cm AV Vmax:           171.00 cm/s AV Vmean:          117.000 cm/s AV VTI:            0.345 m AV Peak Grad:      11.7 mmHg AV Mean Grad:      6.0 mmHg LVOT Vmax:         158.00 cm/s LVOT Vmean:        102.000 cm/s LVOT VTI:          0.326 m LVOT/AV VTI ratio: 0.94  AORTA Ao Root diam: 2.70 cm Ao Asc diam:  3.40 cm MITRAL VALVE                  TRICUSPID VALVE MV Peak grad: 14.3 mmHg       TR Peak grad:   47.3 mmHg MV Mean grad: 5.0 mmHg        TR Vmax:         344.00 cm/s MV Vmax:      1.89 m/s MV Vmean:     110.0 cm/s      SHUNTS MR Peak grad:    122.3 mmHg   Systemic VTI:  0.33 m MR Mean grad:    79.0 mmHg    Systemic Diam: 2.20 cm MR Vmax:         553.00 cm/s MR Vmean:        420.0 cm/s MR PISA:         5.09 cm MR PISA Eff ROA: 35 mm MR PISA Radius:  0.90 cm Nona Dell MD Electronically signed by Nona Dell MD Signature Date/Time: 11/07/2022/11:05:31 AM    Final    [place] EKG SITE RITE  Result Date: 11/07/2022 If Site Rite image not attached, placement could not be confirmed due to current cardiac rhythm.  [place] EKG SITE RITE  Result Date: 11/07/2022 If Site Rite image not attached, placement could not be confirmed due to current cardiac rhythm.  DG Chest Portable 1 View  Result Date: 11/06/2022 CLINICAL DATA:  shortness of breath EXAM: PORTABLE CHEST 1 VIEW COMPARISON:  Chest x-ray 08/30/2015 FINDINGS: The heart and mediastinal contours are within normal limits. Nodular - like patchy airspace opacities within bilateral lower lung  zones. Chronic coarsened interstitial markings with slightly more prominent interstitial markings. No pleural effusion. No pneumothorax. No acute osseous abnormality. IMPRESSION: 1. Likely mild pulmonary edema. 2. Nodular - like patchy airspace opacities within bilateral lower lung zones. Findings could represent infection/inflammation. Followup PA and lateral chest X-ray is recommended in 3-4 weeks following therapy to ensure resolution and exclude underlying malignancy. Electronically Signed   By: Tish Frederickson M.D.   On: 11/06/2022 18:25     Subjective: Patient was seen and examined at bedside.  Overnight events noted.   Patient report doing much better and wants to be discharged.  Patient being discharged home.  Discharge Exam: Vitals:   11/21/22 0445 11/21/22 0836  BP: (!) 114/92 (!) 121/90  Pulse: 61 64  Resp: 18 18  Temp: 98.2 F (36.8 C) 98.4 F (36.9 C)  SpO2: 97% 92%   Vitals:   11/20/22 1404  11/20/22 2100 11/21/22 0445 11/21/22 0836  BP: (!) 120/93 (!) 107/90 (!) 114/92 (!) 121/90  Pulse: (!) 57 (!) 56 61 64  Resp: 19 18 18 18   Temp: 97.9 F (36.6 C) 97.7 F (36.5 C) 98.2 F (36.8 C) 98.4 F (36.9 C)  TempSrc:  Oral Oral Oral  SpO2: 96% 96% 97% 92%  Weight:   95.8 kg   Height:        General: Pt is alert, awake, not in acute distress Cardiovascular: RRR, S1/S2 +, no rubs, no gallops Respiratory: CTA bilaterally, no wheezing, no rhonchi Abdominal: Soft, NT, ND, bowel sounds + Extremities: no edema, no cyanosis    The results of significant diagnostics from this hospitalization (including imaging, microbiology, ancillary and laboratory) are listed below for reference.     Microbiology: Recent Results (from the past 240 hour(s))  SARS Coronavirus 2 by RT PCR (hospital order, performed in [facility] hospital lab) *cepheid single result test* Anterior Nasal Swab     Status: None   Collection Time: 11/18/22  7:48 PM   Specimen: Anterior Nasal Swab  Result Value Ref Range Status   SARS Coronavirus 2 by RT PCR NEGATIVE NEGATIVE Final    Comment: (NOTE) SARS-CoV-2 target nucleic acids are NOT DETECTED.  The SARS-CoV-2 RNA is generally detectable in upper and lower respiratory specimens during the acute phase of infection. The lowest concentration of SARS-CoV-2 viral copies this assay can detect is 250 copies / mL. A negative result does not preclude SARS-CoV-2 infection and should not be used as the sole basis for treatment or other patient management decisions.  A negative result may occur with improper specimen collection / handling, submission of specimen other than nasopharyngeal swab, presence of viral mutation(s) within the areas targeted by this assay, and inadequate number of viral copies (<250 copies / mL). A negative result must be combined with clinical observations, patient history, and epidemiological information.  Fact Sheet for Patients:    RoadLapTop.co.za  Fact Sheet for Healthcare Providers: http://kim-miller.com/  This test is not yet approved or  cleared by the [place] FDA and has been authorized for detection and/or diagnosis of SARS-CoV-2 by FDA under an Emergency Use Authorization (EUA).  This EUA will remain in effect (meaning this test can be used) for the duration of the COVID-19 declaration under Section 564(b)(1) of the Act, 21 U.S.C. section 360bbb-3(b)(1), unless the authorization is terminated or revoked sooner.  Performed at [facility], [address], [place], [place] [address]      Labs: BNP (last 3 results) Recent Labs    11/06/22  1711 11/18/22 1828  BNP 921.0* 544.0*   Basic Metabolic Panel: Recent Labs  Lab 11/18/22 1828 11/19/22 0445 11/20/22 0348 11/21/22 0442  NA 137 141 138 136  K 4.2 3.5 3.4* 3.5  CL 96* 97* 96* 96*  CO2 31 32 31 27  GLUCOSE 121* 112* 117* 120*  BUN 17 18 20  22*  CREATININE 0.98 0.90 0.92 1.09*  CALCIUM 8.7* 8.9 9.3 9.1  MG  --  2.0 2.1 2.3   Liver Function Tests: Recent Labs  Lab 11/18/22 1828 11/19/22 0445  AST 39 27  ALT 37 31  ALKPHOS 57 53  BILITOT 0.7 0.3  PROT 6.9 6.4*  ALBUMIN 3.3* 3.0*   No results for input(s): "LIPASE", "AMYLASE" in the last 168 hours. No results for input(s): "AMMONIA" in the last 168 hours. CBC: Recent Labs  Lab 11/18/22 1828 11/19/22 0642 11/20/22 0348 11/21/22 0442  WBC 8.0 7.1 9.5 9.9  NEUTROABS 4.7 3.5  --   --   HGB 9.0* 9.3* 10.2* 10.2*  HCT 31.3* 32.9* 35.2* 36.2  MCV 90.7 91.1 89.3 88.7  PLT 275 248 310 219   Cardiac Enzymes: No results for input(s): "CKTOTAL", "CKMB", "CKMBINDEX", "TROPONINI" in the last 168 hours. BNP: Invalid input(s): "POCBNP" CBG: No results for input(s): "GLUCAP" in the last 168 hours. D-Dimer No results for input(s): "DDIMER" in the last 72 hours. Hgb A1c No results for input(s): "HGBA1C" in the last 72 hours. Lipid  Profile No results for input(s): "CHOL", "HDL", "LDLCALC", "TRIG", "CHOLHDL", "LDLDIRECT" in the last 72 hours. Thyroid function studies No results for input(s): "TSH", "T4TOTAL", "T3FREE", "THYROIDAB" in the last 72 hours.  Invalid input(s): "FREET3" Anemia work up No results for input(s): "VITAMINB12", "FOLATE", "FERRITIN", "TIBC", "IRON", "RETICCTPCT" in the last 72 hours. Urinalysis    Component Value Date/Time   COLORURINE YELLOW 10/02/2017 1326   APPEARANCEUR CLOUDY (A) 10/02/2017 1326   LABSPEC 1.019 10/02/2017 1326   PHURINE 6.0 10/02/2017 1326   GLUCOSEU NEGATIVE 10/02/2017 1326   HGBUR NEGATIVE 10/02/2017 1326   BILIRUBINUR NEGATIVE 10/02/2017 1326   KETONESUR NEGATIVE 10/02/2017 1326   PROTEINUR 30 (A) 10/02/2017 1326   UROBILINOGEN 0.2 09/02/2013 1619   NITRITE NEGATIVE 10/02/2017 1326   LEUKOCYTESUR TRACE (A) 10/02/2017 1326   Sepsis Labs Recent Labs  Lab 11/18/22 1828 11/19/22 0642 11/20/22 0348 11/21/22 0442  WBC 8.0 7.1 9.5 9.9   Microbiology Recent Results (from the past 240 hour(s))  SARS Coronavirus 2 by RT PCR (hospital order, performed in [facility] hospital lab) *cepheid single result test* Anterior Nasal Swab     Status: None   Collection Time: 11/18/22  7:48 PM   Specimen: Anterior Nasal Swab  Result Value Ref Range Status   SARS Coronavirus 2 by RT PCR NEGATIVE NEGATIVE Final    Comment: (NOTE) SARS-CoV-2 target nucleic acids are NOT DETECTED.  The SARS-CoV-2 RNA is generally detectable in upper and lower respiratory specimens during the acute phase of infection. The lowest concentration of SARS-CoV-2 viral copies this assay can detect is 250 copies / mL. A negative result does not preclude SARS-CoV-2 infection and should not be used as the sole basis for treatment or other patient management decisions.  A negative result may occur with improper specimen collection / handling, submission of specimen other than nasopharyngeal swab, presence  of viral mutation(s) within the areas targeted by this assay, and inadequate number of viral copies (<250 copies / mL). A negative result must be combined with clinical observations, patient history,  and epidemiological information.  Fact Sheet for Patients:   RoadLapTop.co.za  Fact Sheet for Healthcare Providers: http://kim-miller.com/  This test is not yet approved or  cleared by the [place] FDA and has been authorized for detection and/or diagnosis of SARS-CoV-2 by FDA under an Emergency Use Authorization (EUA).  This EUA will remain in effect (meaning this test can be used) for the duration of the COVID-19 declaration under Section 564(b)(1) of the Act, 21 U.S.C. section 360bbb-3(b)(1), unless the authorization is terminated or revoked sooner.  Performed at [facility], [address] [place], [place] [address]      Time coordinating discharge: Over 30 minutes  SIGNED:   Willeen Niece, MD  [facility] 11/21/2022, 10:46 AM Pager   If 7PM-7AM, please contact night-coverage

## 2022-11-21 NOTE — Progress Notes (Signed)
Patient discharged with instructions given on medications and follow up visits,patient verbalized understanding. Prescriptions sent to Pharmacy of  choice documented on AVS. IV discontinued,catheter intact. Staff to accompany patient to an awaiting vehicle.

## 2022-11-24 ENCOUNTER — Encounter: Payer: Self-pay | Admitting: Gastroenterology

## 2022-11-24 NOTE — Progress Notes (Unsigned)
Referring Provider:Shah, Pratik D, DO ([facility]) Primary Care Physician:  Pcp, No Primary Gastroenterologist:  Dr. Marletta Lor  Chief Complaint  Patient presents with   Anemia   Gastroesophageal Reflux    HPI:   [name] is a 52 y.o. female presenting today at the request of Erick Blinks, DO for IDA.   Patient was admitted 4/17 - 4/22 with acute hypoxemic respiratory failure-multifactorial in the setting of new onset CHF, community-acquired pneumonia, and possible COPD exacerbation.  She was found to have moderate mitral regurgitation.  She was also found to have iron deficiency anemia with hemoglobin 7.8 on day of admission, iron 23, saturation 5%, ferritin 12.  She received IV iron while inpatient.  No overt bleeding noted, and Hemoglobin remained stable.  Recommended outpatient follow-up with cardiology and GI.  She was again admitted on 4/29 with acute on chronic diastolic CHF.  She was diuresed and recommended outpatient follow-up with cardiology.  Most recent hemoglobin 10.2 on 11/21/22.   Today:  No known hx of anemia. No BRBPR or melena. No prior colonoscopy. Had EGD many years ago to have her esophagus stretched. No Hx of stomach ulcers.  Bowels are moving well. No unintentional weight loss.   Just got out of prison. Out of omeprazole for about 3 weeks. When taking 40 mg daily, symptoms were well controlled. Solid foods getting hung in her esophagus. Started a couple weeks ago when she ran out of her medication. No trouble with soft foods, pills, or liquids. No abdominal pain, nausea, or vomiting.   No other bleeding overt bleeding such as abnormal vaginal bleeding, hematuria, nosebleeds.  Aleve 2-3 times a week for back pain. 2 pills a day. Was taking goody powders 1-2 times a day 2-3 times a week. Had been taking for years. Still taking about once a week for headaches or back pain.   Has history of Hepatitis C. Was treated while in prison within the last few  years.   Feeling much better in regards to respiratory and volume status.   Past Medical History:  Diagnosis Date   Anxiety    Arthritis    Cancer (HCC)    Cervical cancer (HCC)    Depressed    Diastolic heart failure (HCC)    Grade 2   Drug addiction (HCC) Hep C   Foot abscess, left    Gastroesophageal reflux disease    Gastroparesis    Hepatitis C    pt denies any treatment   Hypertension    Mitral regurgitation    Moderate   Polysubstance dependence including opioid type drug, episodic abuse (HCC)    Psoriasis    Renal disorder    kidney stones    Past Surgical History:  Procedure Laterality Date   APPENDECTOMY     APPLICATION OF WOUND VAC Left 10/10/2017   Procedure: APPLICATION OF WOUND VAC;  Surgeon: Nadara Mustard, MD;  Location: [facility] OR;  Service: Orthopedics;  Laterality: Left;   ESOPHAGOGASTRODUODENOSCOPY  01/04/2008   Dr Teddy Spike reflux esophagitis, sm HH, gastritis, BRAVO positive for GERD on BID PPI   I & D EXTREMITY Left 10/03/2017   Procedure: IRRIGATION AND DEBRIDEMENT EXTREMITY;  Surgeon: Nadara Mustard, MD;  Location: [facility] OR;  Service: Orthopedics;  Laterality: Left;   IRRIGATION AND DEBRIDEMENT FOOT Left 10/03/2017   LASER ABLATION CONDYLOMA CERVICAL / VULVAR     mandible surgery for fracture     SKIN SPLIT GRAFT Left 10/10/2017   Procedure: SKIN  GRAFT SPLIT THICKNESS LEFT FOOT;  Surgeon: Nadara Mustard, MD;  Location: [facility] OR;  Service: Orthopedics;  Laterality: Left;   TEE WITHOUT CARDIOVERSION N/A 11/11/2022   Procedure: TRANSESOPHAGEAL ECHOCARDIOGRAM (TEE);  Surgeon: Marjo Bicker, MD;  Location: [facility] ORS;  Service: Cardiovascular;  Laterality: N/A;   TUBAL LIGATION      Current Outpatient Medications  Medication Sig Dispense Refill   aspirin EC 81 MG tablet Take 81 mg by mouth 2 (two) times a week.      Buprenorphine HCl-Naloxone HCl 8-2 MG FILM Place under the tongue 2 (two) times daily.     citalopram (CELEXA) 40 MG tablet Take 40 mg by  mouth daily.     empagliflozin (JARDIANCE) 10 MG TABS tablet Take 1 tablet (10 mg total) by mouth daily. 30 tablet 1   ferrous sulfate 324 MG TBEC Take 1 tablet (324 mg total) by mouth daily with breakfast. 30 tablet 3   ibuprofen (ADVIL,MOTRIN) 200 MG tablet Take 800 mg by mouth every 6 (six) hours as needed for moderate pain.     Ipratropium-Albuterol (COMBIVENT RESPIMAT) 20-100 MCG/ACT AERS respimat Inhale 1 puff into the lungs every 6 (six) hours as needed for wheezing or shortness of breath. 4 g 1   metoprolol tartrate (LOPRESSOR) 25 MG tablet Take 1 tablet (25 mg total) by mouth 2 (two) times daily. 60 tablet 1   mirtazapine (REMERON) 15 MG tablet Take 15 mg by mouth at bedtime as needed (depression).     omeprazole (PRILOSEC) 40 MG capsule Take 1 capsule (40 mg total) by mouth daily before breakfast. 30 capsule 5   potassium chloride (KLOR-CON M) 10 MEQ tablet Take 1 tablet (10 mEq total) by mouth 2 (two) times daily. 60 tablet 0   torsemide (DEMADEX) 20 MG tablet Take 1 tablet (20 mg total) by mouth daily. 30 tablet 1   acetaminophen (TYLENOL) 325 MG tablet Take 650 mg by mouth every 6 (six) hours as needed for moderate pain.     No current facility-administered medications for this visit.    Allergies as of 11/27/2022 - Review Complete 11/18/2022  Allergen Reaction Noted   Tramadol hcl Anaphylaxis, Hives, Itching, and Swelling    Ketorolac Nausea And Vomiting, Rash, and Other (See Comments) 03/06/2012   Propoxyphene Rash 10/06/2013    Family History  Problem Relation Age of Onset   Hyperlipidemia Mother    Diabetes Mother    Hypertension Mother    Hypertension Sister    Hepatitis C Sister    Hypertension Father    Colon cancer Neg Hx    Liver disease Neg Hx     Social History   Socioeconomic History   Marital status: Divorced    Spouse name: Not on file   Number of children: 2   Years of education: Not on file   Highest education level: Not on file  Occupational  History   Occupation: disabled    Employer: UNEMPLOYED  Tobacco Use   Smoking status: Every Day    Packs/day: 1.00    Years: 28.00    Additional pack years: 0.00    Total pack years: 28.00    Types: Cigarettes   Smokeless tobacco: Never  Vaping Use   Vaping Use: Never used  Substance and Sexual Activity   Alcohol use: No    Comment: occ ( denies 3/ 21/19)   Drug use: Yes    Types: Cocaine, IV, "Crack" cocaine, Marijuana, Opium, Heroin    Comment: Clean  for 5 years (documented May 2024)   Sexual activity: Not Currently    Birth control/protection: None, Surgical  Other Topics Concern   Not on file  Social History Narrative   Lives w/ mother & 2 children (17 &13)   Social Determinants of Health   Financial Resource Strain: Not on file  Food Insecurity: No Food Insecurity (11/18/2022)   Hunger Vital Sign    Worried About Running Out of Food in the Last Year: Never true    Ran Out of Food in the Last Year: Never true  Transportation Needs: No Transportation Needs (11/18/2022)   PRAPARE - Administrator, Civil Service (Medical): No    Lack of Transportation (Non-Medical): No  Physical Activity: Not on file  Stress: Not on file  Social Connections: Not on file  Intimate Partner Violence: Not At Risk (11/18/2022)   Humiliation, Afraid, Rape, and Kick questionnaire    Fear of Current or Ex-Partner: No    Emotionally Abused: No    Physically Abused: No    Sexually Abused: No    Review of Systems: Gen: Denies any fever, chills, or flulike symptoms, presyncope, syncope. CV: Denies chest pain, heart palpitations. Resp: Denies shortness of breath, cough.  GI: See HPI GU : Denies urinary burning, urinary frequency, urinary hesitancy MS: Admits to back Derm: Denies rash. Psych: Denies depression, anxiety. Heme: See HPI  Physical Exam: BP 126/76   Pulse 74   Temp 97.6 F (36.4 C)   Ht 5\' 2"  (1.575 m)   Wt 220 lb 12.8 oz (100.2 kg)   LMP 01/20/2015   SpO2 94%    BMI 40.38 kg/m  General:   Alert and oriented. Pleasant and cooperative. Well-nourished and well-developed.  Head:  Normocephalic and atraumatic. Eyes:  Without icterus, sclera clear and conjunctiva pink.  Ears:  Normal auditory acuity. Lungs:  Clear to auscultation bilaterally. No wheezes, rales, or rhonchi. No distress.  Heart:  S1, S2 present without murmurs appreciated.  Abdomen:  +BS, soft, non-tender and non-distended. No HSM noted. No guarding or rebound. No masses appreciated.  Rectal:  Deferred  Msk:  Symmetrical without gross deformities. Normal posture. Extremities: Trace lower extremity edema. Neurologic:  Alert and  oriented x4;  grossly normal neurologically. Skin:  Intact without significant lesions or rashes. Psych: Normal mood and affect.    Assessment:  52 year old female with history of COPD, CHF/grade 2 diastolic heart failure, mitral regurgitation, polysubstance abuse now in remission, hepatitis C, GERD, cervical cancer, depression, presenting today at the request of Dr. Sherryll Burger ([facility]) for IDA.  Patient also reporting GERD and dysphagia.  IDA: Recently found to have iron deficiency anemia during admission in April with hemoglobin of 7.8.  She received IV iron while inpatient.  Most recent hemoglobin 10.2 on 11/21/2022.  She denies overt GI bleeding or any other obvious blood loss.  GI symptoms include uncontrolled GERD and dysphagia.  No prior colonoscopy but reports EGD many years ago with esophageal dilation.  Denies any history of gastric ulcers.  She does been taking Aleve and Goody powders frequently.  Differentials include gastritis, duodenitis, esophagitis, peptic ulcer disease, AVMs, polyps, malignancy.  She needs EGD and colonoscopy for further evaluation.  GERD: Chronic.  Previously well-controlled on omeprazole 40 mg daily, but has been out of medications for about 3 weeks, now having daily symptoms.  Dysphagia: History of dysphagia with  esophageal dilation many years ago per patient.  Reports recurrent solid food dysphagia over the last couple  of weeks since running out of omeprazole and GERD becoming uncontrolled.  Suspect symptoms are related to reflux, but unable to rule out esophageal web, ring, stricture.  Hepatitis C: Reports history of hep C s/p treatment while in prison within the last few years.  Unknown if she has achieved SVR.  No recent abdominal imaging on file to rule out advanced fibrosis/cirrhosis.   Plan:  Hepatitis C RNA quantitative Abdominal ultrasound with elastography Proceed with upper endoscopy +/- dilation + colonsocopy with propofol by Dr. Marletta Lor in near future. The risks, benefits, and alternatives have been discussed with the patient in detail. The patient states understanding and desires to proceed.  ASA 3 UDS Hold iron x 7 days prior to procedure. Hold Jardiance x 3 days prior to procedure. Resume omeprazole 40 mg daily 30 minutes before breakfast.  Prescription sent to pharmacy. Counseled on GERD diet/lifestyle.  Written instructions provided. Start ferrous sulfate daily.  Prescription sent to pharmacy. Avoid all NSAIDs. Follow-up in 3 months.   Ermalinda Memos, PA-C [facility] 11/27/2022

## 2022-11-27 ENCOUNTER — Ambulatory Visit (INDEPENDENT_AMBULATORY_CARE_PROVIDER_SITE_OTHER): Payer: Medicaid Other | Admitting: Gastroenterology

## 2022-11-27 ENCOUNTER — Telehealth: Payer: Self-pay | Admitting: *Deleted

## 2022-11-27 ENCOUNTER — Encounter: Payer: Self-pay | Admitting: Gastroenterology

## 2022-11-27 VITALS — BP 126/76 | HR 74 | Temp 97.6°F | Ht 62.0 in | Wt 220.8 lb

## 2022-11-27 DIAGNOSIS — K219 Gastro-esophageal reflux disease without esophagitis: Secondary | ICD-10-CM | POA: Diagnosis not present

## 2022-11-27 DIAGNOSIS — Z8619 Personal history of other infectious and parasitic diseases: Secondary | ICD-10-CM

## 2022-11-27 DIAGNOSIS — D509 Iron deficiency anemia, unspecified: Secondary | ICD-10-CM

## 2022-11-27 DIAGNOSIS — R1310 Dysphagia, unspecified: Secondary | ICD-10-CM

## 2022-11-27 MED ORDER — OMEPRAZOLE 40 MG PO CPDR
40.00 mg | DELAYED_RELEASE_CAPSULE | Freq: Every day | ORAL | 5 refills | Status: DC
Start: 2022-11-27 — End: 2023-01-26

## 2022-11-27 MED ORDER — FERROUS SULFATE 324 MG PO TBEC
324.0000 mg | DELAYED_RELEASE_TABLET | Freq: Every day | ORAL | 3 refills | Status: DC
Start: 2022-11-27 — End: 2023-09-26

## 2022-11-27 NOTE — Patient Instructions (Signed)
We will arrange for you to have an upper endoscopy and colonoscopy with Dr. Marletta Lor at [facility].  You will need to hold iron for 7 days prior to your procedures. You will also need to hold Jardiance for 3 days prior to your procedures.  We will arrange for you to have an ultrasound of your abdomen due to your history of hepatitis C.    Please have blood work completed at [facility] to ensure that hepatitis C has been completely eradicated.  Start omeprazole 40 mg daily 30 minutes before breakfast.  I have sent a prescription to your pharmacy.  Follow a GERD diet:  Avoid fried, fatty, greasy, spicy, citrus foods. Avoid caffeine and carbonated beverages. Avoid chocolate. Try eating 4-6 small meals a day rather than 3 large meals. Do not eat within 3 hours of laying down. Prop head of bed up on wood or bricks to create a 6 inch incline.  Start iron daily.  I have sent a prescription to your pharmacy.  Goody powders, Advil, and avoid all NSAID products including aspirin powders, ibuprofen, Aleve, naproxen, anything that says "NSAID" on the package.  We will see you back in the office in about 3 months.  Do not hesitate to call sooner if you have questions or concerns.  Ermalinda Memos, PA-C [facility]

## 2022-11-27 NOTE — Telephone Encounter (Signed)
LMTCB for pt as she was not available at the moment  [place] scheduled for 5/21, arrival 11:15am, npo midnight Needs to schedule YCS/EGD +/-DIL with Dr. Marletta Lor, ASA 3, UDS also, hold iron x 7 daysm no jardiance x 3 days

## 2022-12-02 NOTE — Telephone Encounter (Signed)
LMTCB. Letter mailed ?

## 2022-12-10 ENCOUNTER — Ambulatory Visit (HOSPITAL_COMMUNITY): Payer: Medicaid Other | Attending: Gastroenterology

## 2022-12-12 MED ORDER — PEG 3350-KCL-NA BICARB-NACL 420 G PO SOLR: 4000.0000 mL | Freq: Once | ORAL | 0 refills | Status: AC

## 2022-12-12 NOTE — Telephone Encounter (Signed)
Pt returned call. Scheduled for 6/19 with Dr. Marletta Lor. Aware will send rx prep to pharmacy. Will call back with pre-op appt. Instructions to be mailed. Aware needs to hold iron 1 week prior, jardiance 3 days prior.

## 2022-12-12 NOTE — Telephone Encounter (Signed)
Spoke with pt and she is aware of pre-op appt details 

## 2022-12-12 NOTE — Addendum Note (Signed)
Addended by: Armstead Peaks on: 12/12/2022 10:36 AM   Modules accepted: Orders

## 2022-12-18 ENCOUNTER — Ambulatory Visit: Payer: Medicaid Other | Admitting: Internal Medicine

## 2023-01-06 ENCOUNTER — Encounter (HOSPITAL_COMMUNITY): Admission: RE | Admit: 2023-01-06 | Payer: 59 | Source: Ambulatory Visit

## 2023-01-06 ENCOUNTER — Telehealth: Payer: Self-pay | Admitting: *Deleted

## 2023-01-06 NOTE — Telephone Encounter (Signed)
Pt procedure scheduled for wed 6/19 with Dr. Marletta Lor. She needed to reschedule. She has been moved to 7/1. Reports she already did pre-op today on the telephone. Advised will send message to endo to change appt. She also reports she stopped her iron and jardiance last month and not taking it. Aware will send new instructions also. She is aware if she does restart these medications of when to start holding them. She voiced understanding.

## 2023-01-06 NOTE — Telephone Encounter (Signed)
Pt called and left vm wanting to know the arrival time of her procedure.  Advised pt that she will be told the arrival time at her pre-op visit today. Pt verbalized understanding.

## 2023-01-14 ENCOUNTER — Telehealth: Payer: Self-pay | Admitting: *Deleted

## 2023-01-14 NOTE — Telephone Encounter (Signed)
Need to reschedule her procedure for 01/20/23 due to provider being out.

## 2023-01-14 NOTE — Telephone Encounter (Signed)
Attempted to call pt and number says call can not be completed at this time, called best friend Edgardo Roys on [place], says call can not be completed at this time. Called daughter Chika Cichowski, [facility]

## 2023-01-15 NOTE — Telephone Encounter (Signed)
Noted  

## 2023-01-15 NOTE — Telephone Encounter (Signed)
Pt left vm returning call about rescheduling.  Called pt back and she states she can't reschedule at this time due to her taking care of her mother. She states she will call back to reschedule when things get worked out. FYI

## 2023-01-15 NOTE — Telephone Encounter (Signed)
Spoke to Eber Jones (pt's daughter on [place]) to have pt return call so that we can get her rescheduled from 01/20/23

## 2023-01-16 ENCOUNTER — Encounter (HOSPITAL_COMMUNITY)
Admission: RE | Admit: 2023-01-16 | Discharge: 2023-01-16 | Disposition: A | Discharge status: Home / Self Care | Payer: 59 | Source: Ambulatory Visit | Attending: Internal Medicine | Admitting: Internal Medicine

## 2023-01-20 ENCOUNTER — Ambulatory Visit (HOSPITAL_COMMUNITY): Admission: RE | Admit: 2023-01-20 | Payer: 59 | Source: Home / Self Care

## 2023-01-20 ENCOUNTER — Encounter (HOSPITAL_COMMUNITY): Admission: RE | Payer: Self-pay | Source: Home / Self Care

## 2023-01-20 SURGERY — COLONOSCOPY WITH PROPOFOL
Anesthesia: Monitor Anesthesia Care

## 2023-01-26 ENCOUNTER — Telehealth: Payer: Self-pay | Admitting: Gastroenterology

## 2023-01-26 DIAGNOSIS — K219 Gastro-esophageal reflux disease without esophagitis: Secondary | ICD-10-CM

## 2023-01-26 MED ORDER — PANTOPRAZOLE SODIUM 40 MG PO TBEC
40.00 mg | DELAYED_RELEASE_TABLET | Freq: Every day | ORAL | 3 refills | Status: AC
Start: 2023-01-26 — End: ?

## 2023-01-26 NOTE — Telephone Encounter (Signed)
Please let patient know that due to her current dose of citalopram, will need to change her omeprazole to pantoprazole. There is a potential interaction between omeprazole and higher doses of citalopram (greater than 20 mg daily).   I will send in a new Rx for her. Can you let the pharmacy know to discontinue omeprazole?

## 2023-01-27 NOTE — Telephone Encounter (Signed)
Spoke to pt informed her of medication change. Pt voiced understanding. Also, called pharmacy

## 2023-01-31 ENCOUNTER — Encounter: Payer: Self-pay | Admitting: Gastroenterology

## 2023-03-07 ENCOUNTER — Ambulatory Visit: Payer: Medicaid Other | Attending: Internal Medicine | Admitting: Internal Medicine

## 2023-03-07 ENCOUNTER — Encounter: Payer: Self-pay | Admitting: Internal Medicine

## 2023-03-07 VITALS — BP 144/96 | HR 80 | Ht 62.0 in | Wt 203.0 lb

## 2023-03-07 DIAGNOSIS — R0683 Snoring: Secondary | ICD-10-CM

## 2023-03-07 DIAGNOSIS — G4733 Obstructive sleep apnea (adult) (pediatric): Secondary | ICD-10-CM

## 2023-03-07 MED ORDER — TORSEMIDE 20 MG PO TABS: 20.0000 mg | ORAL_TABLET | Freq: Every day | ORAL | 1 refills | Status: AC

## 2023-03-07 MED ORDER — EMPAGLIFLOZIN 10 MG PO TABS: 10.0000 mg | ORAL_TABLET | Freq: Every day | ORAL | 1 refills | Status: DC

## 2023-03-07 NOTE — Patient Instructions (Signed)
Medication Instructions:  Your physician recommends that you continue on your current medications as directed. Please refer to the Current Medication list given to you today.  *If you need a refill on your cardiac medications before your next appointment, please call your pharmacy*   Lab Work: None If you have labs (blood work) drawn today and your tests are completely normal, you will receive your results only by: MyChart Message (if you have MyChart) OR A paper copy in the mail If you have any lab test that is abnormal or we need to change your treatment, we will call you to review the results.   Testing/Procedures: [place] Sleep Study   Follow-Up: At [facility], you and your health needs are our priority.  As part of our continuing mission to provide you with exceptional heart care, we have created designated Provider Care Teams.  These Care Teams include your primary Cardiologist (physician) and Advanced Practice Providers (APPs -  Physician Assistants and Nurse Practitioners) who all work together to provide you with the care you need, when you need it.  We recommend signing up for the patient portal called "MyChart".  Sign up information is provided on this After Visit Summary.  MyChart is used to connect with patients for Virtual Visits (Telemedicine).  Patients are able to view lab/test results, encounter notes, upcoming appointments, etc.  Non-urgent messages can be sent to your provider as well.   To learn more about what you can do with MyChart, go to [email or web address].    Your next appointment:   6 month(s)  Provider:   You may see Vishnu P Mallipeddi, MD or one of the following Advanced Practice Providers on your designated Care Team:   [place], PA-C  Jacolyn Reedy, [place]     Other Instructions

## 2023-03-07 NOTE — Progress Notes (Signed)
Sleep Apnea Evaluation  [facility]  Today's Date: 03/07/2023   Patient Name: [name]        DOB: Mar 16, 1971       Height:  5\' 2"  (1.575 m)     Weight: 203 lb (92.1 kg)  BMI: Body mass index is 37.13 kg/m.    Referring Provider:  Luane School, MD   STOP-BANG RISK ASSESSMENT         If STOP-BANG Score ?3 OR two clinical symptoms - patient qualifies for WatchPAT (CPT 95800)      Sleep study ordered due to two (2) of the following clinical symptoms/diagnoses:  Excessive daytime sleepiness G47.10  Gastroesophageal reflux K21.9  Nocturia R35.1  Morning Headaches G44.221  Difficulty concentrating R41.840  Memory problems or poor judgment G31.84  Personality changes or irritability R45.4  Loud snoring R06.83  Depression F32.9  Unrefreshed by sleep G47.8  Impotence N52.9  History of high blood pressure R03.0  Insomnia G47.00  Sleep Disordered Breathing or Sleep Apnea ICD G47.33

## 2023-03-07 NOTE — Progress Notes (Signed)
Cardiology Office Note  Date: 03/07/2023   ID: [name], DOB 1970/09/13, MRN [id number]  PCP:  Pcp, No  Cardiologist:  Marjo Bicker, MD Electrophysiologist:  None    History of Present Illness: [name] is a 52 y.o. female known to have chronic diastolic heart failure, HTN, severe iron deficiency anemia is here for follow-up visit.  Patient was admitted in 11/09/2022 and 12/09/2022 to [facility] for acute on chronic HFpEF exacerbations likely secondary to severe iron deficiency anemia. TTE read moderate to severe MR but TEE showed mild to moderate MR. She saw GI and was scheduled for EGD/colonoscopy but she was a no-show during her preadmission testing due to which the procedure was canceled.  She is here for follow-up visit.  She is under a lot of stress right now due to taking care of her sick mother and also her daughter return from present.  She denies having any symptoms of angina, DOE, orthopnea, PND, leg swelling, dizziness, presyncope and syncope.  No palpitations.  Patient stopped taking iron supplements as it made her feel constipated.  She is not taking Jardiance as she did not have any refills.  She also reported she was told she snores and has nighttime apneas.  She never got tested for OSA.  Past Medical History:  Diagnosis Date   Anxiety    Arthritis    Cancer (HCC)    Cervical cancer (HCC)    Depressed    Diastolic heart failure (HCC)    Grade 2   Drug addiction (HCC) Hep C   Foot abscess, left    Gastroesophageal reflux disease    Gastroparesis    Hepatitis C    pt denies any treatment   Hypertension    Mitral regurgitation    Moderate   Polysubstance dependence including opioid type drug, episodic abuse (HCC)    Psoriasis    Renal disorder    kidney stones    Past Surgical History:  Procedure Laterality Date   APPENDECTOMY     APPLICATION OF WOUND VAC Left 10/10/2017   Procedure: APPLICATION OF WOUND VAC;  Surgeon: Nadara Mustard,  MD;  Location: [facility] OR;  Service: Orthopedics;  Laterality: Left;   ESOPHAGOGASTRODUODENOSCOPY  01/04/2008   Dr Teddy Spike reflux esophagitis, sm HH, gastritis, BRAVO positive for GERD on BID PPI   I & D EXTREMITY Left 10/03/2017   Procedure: IRRIGATION AND DEBRIDEMENT EXTREMITY;  Surgeon: Nadara Mustard, MD;  Location: [facility] OR;  Service: Orthopedics;  Laterality: Left;   IRRIGATION AND DEBRIDEMENT FOOT Left 10/03/2017   LASER ABLATION CONDYLOMA CERVICAL / VULVAR     mandible surgery for fracture     SKIN SPLIT GRAFT Left 10/10/2017   Procedure: SKIN GRAFT SPLIT THICKNESS LEFT FOOT;  Surgeon: Nadara Mustard, MD;  Location: [facility] OR;  Service: Orthopedics;  Laterality: Left;   TEE WITHOUT CARDIOVERSION N/A 11/11/2022   Procedure: TRANSESOPHAGEAL ECHOCARDIOGRAM (TEE);  Surgeon: Marjo Bicker, MD;  Location: [facility] ORS;  Service: Cardiovascular;  Laterality: N/A;   TUBAL LIGATION      Current Outpatient Medications  Medication Sig Dispense Refill   acetaminophen (TYLENOL) 325 MG tablet Take 650 mg by mouth every 6 (six) hours as needed for moderate pain.     aspirin EC 81 MG tablet Take 81 mg by mouth 2 (two) times a week.      Buprenorphine HCl-Naloxone HCl 8-2 MG FILM Place 1 Film under the tongue 3 (three) times daily.  citalopram (CELEXA) 40 MG tablet Take 40 mg by mouth daily.     empagliflozin (JARDIANCE) 10 MG TABS tablet Take 1 tablet (10 mg total) by mouth daily. (Patient not taking: Reported on 01/06/2023) 30 tablet 1   ferrous sulfate 324 MG TBEC Take 1 tablet (324 mg total) by mouth daily with breakfast. (Patient not taking: Reported on 01/01/2023) 30 tablet 3   ibuprofen (ADVIL,MOTRIN) 200 MG tablet Take 800 mg by mouth every 6 (six) hours as needed for moderate pain.     Ipratropium-Albuterol (COMBIVENT RESPIMAT) 20-100 MCG/ACT AERS respimat Inhale 1 puff into the lungs every 6 (six) hours as needed for wheezing or shortness of breath. 4 g 1   metoprolol tartrate (LOPRESSOR) 25 MG  tablet Take 1 tablet (25 mg total) by mouth 2 (two) times daily. 60 tablet 1   pantoprazole (PROTONIX) 40 MG tablet Take 1 tablet (40 mg total) by mouth daily before breakfast. 30 tablet 3   potassium chloride (KLOR-CON M) 10 MEQ tablet Take 10 mEq by mouth 2 (two) times daily.     torsemide (DEMADEX) 20 MG tablet Take 1 tablet (20 mg total) by mouth daily. 30 tablet 1   No current facility-administered medications for this visit.   Allergies:  Tramadol hcl, Ketorolac, and Propoxyphene   Social History: The patient  reports that she has been smoking cigarettes. She has a 28 pack-year smoking history. She has never used smokeless tobacco. She reports current drug use. Drugs: Cocaine, IV, "Crack" cocaine, Marijuana, Opium, and Heroin. She reports that she does not drink alcohol.   Family History: The patient's family history includes Diabetes in her mother; Hepatitis C in her sister; Hyperlipidemia in her mother; Hypertension in her father, mother, and sister.   ROS:  Please see the history of present illness. Otherwise, complete review of systems is positive for none.  All other systems are reviewed and negative.   Physical Exam: VS:  LMP 01/20/2015 , BMI There is no height or weight on file to calculate BMI.  Wt Readings from Last 3 Encounters:  11/27/22 220 lb 12.8 oz (100.2 kg)  11/21/22 211 lb 3.2 oz (95.8 kg)  11/18/22 229 lb (103.9 kg)    General: Patient appears comfortable at rest. HEENT: Conjunctiva and lids normal, oropharynx clear with moist mucosa. Neck: Supple, no elevated JVP or carotid bruits, no thyromegaly. Lungs: Bilateral rales and wheezing Cardiac: Regular rate and rhythm, no S3 or significant systolic murmur, no pericardial rub. Abdomen: Soft, nontender, no hepatomegaly, bowel sounds present, no guarding or rebound. Extremities: 1+ pitting edema in bilateral lower extremities, distal pulses 2+. Skin: Warm and dry. Musculoskeletal: No kyphosis. Neuropsychiatric:  Alert and oriented x3, affect grossly appropriate.  Recent Labwork: 11/18/2022: B Natriuretic Peptide 544.0 11/19/2022: ALT 31; AST 27 11/21/2022: BUN 22; Creatinine, Ser 1.09; Hemoglobin 10.2; Magnesium 2.3; Platelets 219; Potassium 3.5; Sodium 136     Component Value Date/Time   CHOL 200 07/10/2007 2203   TRIG 418 (H) 07/10/2007 2203   HDL 37 (L) 07/10/2007 2203   CHOLHDL 5.4 Ratio 07/10/2007 2203   VLDL NOT CALC mg/dL 96/29/5284 1324   LDLCALC See Comment mg/dL 40/04/2724 3664    Assessment and Plan: Patient is a 52 year old F known to have chronic diastolic heart failure, HTN, severe iron deficiency anemia is here for follow-up visit posthospitalization.  Chronic diastolic heart failure: Patient had hospitalizations in 4/24 and 5/24 for HFpEF exacerbations from a severe iron deficiency anemia.  Since addition of Protonix, she reported  no DOE, orthopnea, PND symptoms.  Continue p.o. torsemide 20 mg once daily, refill Jardiance 10 mg once daily. Needs to get tested for OSA as she does have symptoms of snoring and nighttime apneas.  Order home sleep study.  She is also following up with GI for workup of severe iron deficiency anemia, due to ongoing stress in her family she says she was not able to make it to GI appt but will make one sooner.  OSA screening: STOP-BANG score is more than 3, has symptoms of snoring and nighttime apneas.  She also had two [facility] admissions for HFpEF exacerbations.  Obtain home sleep study.  Severe iron deficiency anemia not on iron supplementation: She was started on iron supplements but stopped taking it due to severe constipation. She needs to be evaluated by GI to rule out GI causes of iron deficiency anemia.  Mild to moderate MR: TEE in 4/24 showed mild to moderate MR.  This is in the setting of HFpEF exacerbation.  Obtain echocardiogram in 1 year.    Disposition:  Follow up  6 months  Signed, Maresa Morash Verne Spurr, MD, 03/07/2023 9:45 AM    [facility] [address] [place], [place] [address]

## 2023-03-11 ENCOUNTER — Telehealth: Payer: Self-pay | Admitting: *Deleted

## 2023-03-11 NOTE — Telephone Encounter (Signed)
Prior Authorization for [facility] sent to [facility] via web portal. Tracking Number .  READY-Your case has been Approved.-Authorization Number:-[id number] Case Number: [id number] Date:03/11/2023 -Expiration Date: 06/09/2023

## 2023-03-12 ENCOUNTER — Other Ambulatory Visit: Payer: Self-pay | Admitting: *Deleted

## 2023-03-12 MED ORDER — METOPROLOL TARTRATE 25 MG PO TABS: 25.0000 mg | ORAL_TABLET | Freq: Two times a day (BID) | ORAL | 3 refills | Status: DC

## 2023-03-12 NOTE — Telephone Encounter (Signed)
Spoke to pt who verbalized understanding of testing instructions. Patient given pin number ([phone]). All questions answered.

## 2023-03-12 NOTE — Telephone Encounter (Signed)
Attempted to contact patient regarding Itamar device. No answer, no voicemail

## 2023-03-12 NOTE — Addendum Note (Signed)
Addended by: Roseanne Reno on: 03/12/2023 02:52 PM   Modules accepted: Orders

## 2023-04-01 ENCOUNTER — Encounter (INDEPENDENT_AMBULATORY_CARE_PROVIDER_SITE_OTHER): Payer: Medicaid Other | Admitting: Cardiology

## 2023-04-01 DIAGNOSIS — G4733 Obstructive sleep apnea (adult) (pediatric): Secondary | ICD-10-CM | POA: Diagnosis not present

## 2023-04-02 ENCOUNTER — Ambulatory Visit: Payer: Medicaid Other | Attending: Internal Medicine

## 2023-04-02 DIAGNOSIS — R0683 Snoring: Secondary | ICD-10-CM

## 2023-04-02 DIAGNOSIS — G4733 Obstructive sleep apnea (adult) (pediatric): Secondary | ICD-10-CM

## 2023-04-02 NOTE — Procedures (Signed)
   SLEEP STUDY REPORT Patient Information Study Date: 04/01/2023 Patient Name: [name] Patient ID: [id number] Birth Date: 10/22/1970 Age: 52 Gender: Female BMI: 37.3 (W=203 lb, H=5' 2'') Referring Physician: Luane School, MD  TEST DESCRIPTION: Home sleep apnea testing was completed using the WatchPat, a Type 1 device, utilizing  peripheral arterial tonometry (PAT), chest movement, actigraphy, pulse oximetry, pulse rate, body position and snore.  AHI was calculated with apnea and hypopnea using valid sleep time as the denominator. RDI includes apneas,  hypopneas, and RERAs. The data acquired and the scoring of sleep and all associated events were performed in  accordance with the recommended standards and specifications as outlined in the AASM Manual for the Scoring of  Sleep and Associated Events 2.2.0 (2015).  FINDINGS:  1. Severe Obstructive Sleep Apnea with AHI 40.7/hr.   2. No Central Sleep Apnea with pAHIc 3.9/hr.  3. Oxygen desaturations as low as 67%.  4. Severe snoring was present. O2 sats were < 88% for 33 min.  5. Total sleep time was 6 hrs and27 min.  6. 11.1% of total sleep time was spent in REM sleep.   7. Normal sleep onset latency at 22 min.   8. Prolonged REM sleep onset latency at 294 min.   9. Total awakenings were 38.  10. Arrhythmia detection: None.  DIAGNOSIS:  Severe Obstructive Sleep Apnea (G47.33) Nocturnal hypoxemia  RECOMMENDATIONS: 1. Clinical correlation of these findings is necessary. The decision to treat obstructive sleep apnea (OSA) is usually  based on the presence of apnea symptoms or the presence of associated medical conditions such as Hypertension,  Congestive Heart Failure, Atrial Fibrillation or Obesity. The most common symptoms of OSA are snoring, gasping for  breath while sleeping, daytime sleepiness and fatigue.   2. Initiating apnea therapy is recommended given the presence of symptoms and/or associated conditions.   Recommend proceeding with one of the following:   a. Auto-CPAP therapy with a pressure range of 5-20cm H2O.   b. An oral appliance (OA) that can be obtained from certain dentists with expertise in sleep medicine. These are  primarily of use in non-obese patients with mild and moderate disease.   c. An ENT consultation which may be useful to look for specific causes of obstruction and possible treatment  options.   d. If patient is intolerant to PAP therapy, consider referral to ENT for evaluation for hypoglossal nerve stimulator.   3. Close follow-up is necessary to ensure success with CPAP or oral appliance therapy for maximum benefit .  4. A follow-up oximetry study on CPAP is recommended to assess the adequacy of therapy and determine the need  for supplemental oxygen or the potential need for Bi-level therapy. An arterial blood gas to determine the adequacy of  baseline ventilation and oxygenation should also be considered.  5. Healthy sleep recommendations include: adequate nightly sleep (normal 7-9 hrs/night), avoidance of caffeine after  noon and alcohol near bedtime, and maintaining a sleep environment that is cool, dark and quiet.  6. Weight loss for overweight patients is recommended. Even modest amounts of weight loss can significantly  improve the severity of sleep apnea.  7. Snoring recommendations include: weight loss where appropriate, side sleeping, and avoidance of alcohol before  bed.  8. Operation of motor vehicle should be avoided when sleepy.  Signature: Armanda Magic, MD; [facility]; Diplomat, American Board of Sleep  Medicine Electronically Signed: 04/02/2023 11:48:51 A

## 2023-05-04 ENCOUNTER — Other Ambulatory Visit: Payer: Self-pay | Admitting: Internal Medicine

## 2023-05-09 ENCOUNTER — Telehealth: Payer: Self-pay

## 2023-05-09 NOTE — Telephone Encounter (Signed)
-----   Message from Armanda Magic sent at 04/02/2023 11:50 AM EDT ----- Please let patient know that they have sleep apnea.  Recommend therapeutic CPAP titration for treatment of patient's sleep disordered breathing.  If unable to perform an in lab titration then initiate ResMed auto CPAP from 4 to 15cm H2O with heated humidity and mask of choice and overnight pulse ox on CPAP.

## 2023-05-09 NOTE — Telephone Encounter (Signed)
Attempted to reach patient, via VM, VM was full and could not leave message for sleep study results and recommendations.

## 2023-09-11 ENCOUNTER — Ambulatory Visit: Payer: Medicaid Other | Attending: Internal Medicine | Admitting: Internal Medicine

## 2023-09-11 NOTE — Progress Notes (Signed)
 Erroneous encounter - please disregard.

## 2023-09-18 ENCOUNTER — Telehealth: Payer: Self-pay | Admitting: Pharmacy Technician

## 2023-09-18 ENCOUNTER — Other Ambulatory Visit (HOSPITAL_COMMUNITY): Payer: Self-pay

## 2023-09-18 NOTE — Telephone Encounter (Signed)
 Pharmacy Patient Advocate Encounter   Received notification from CoverMyMeds that prior authorization for Jardiance is required/requested.   Insurance verification completed.   The patient is insured through [employer] .   Per test claim: PA required; PA submitted to above mentioned insurance via CoverMyMeds Key/confirmation #/EOC [id number] Status is pending

## 2023-09-18 NOTE — Telephone Encounter (Signed)
 Pharmacy Patient Advocate Encounter  Received notification from [employer] that Prior Authorization for jardiance has been APPROVED from 09/18/23 to 09/17/24. Spoke to pharmacy to process.Copay is $4.00.    PA #/Case ID/Reference #: [id number]

## 2023-09-25 ENCOUNTER — Inpatient Hospital Stay: Payer: Medicaid Other | Attending: Oncology | Admitting: Oncology

## 2023-09-25 ENCOUNTER — Inpatient Hospital Stay: Payer: Medicaid Other

## 2023-09-25 ENCOUNTER — Telehealth: Payer: Self-pay | Admitting: Gastroenterology

## 2023-09-25 VITALS — BP 104/64 | HR 63 | Temp 97.0°F | Resp 17 | Ht 62.0 in | Wt 185.0 lb

## 2023-09-25 DIAGNOSIS — D649 Anemia, unspecified: Secondary | ICD-10-CM

## 2023-09-25 DIAGNOSIS — F17210 Nicotine dependence, cigarettes, uncomplicated: Secondary | ICD-10-CM | POA: Insufficient documentation

## 2023-09-25 DIAGNOSIS — D509 Iron deficiency anemia, unspecified: Secondary | ICD-10-CM | POA: Diagnosis present

## 2023-09-25 DIAGNOSIS — Z79899 Other long term (current) drug therapy: Secondary | ICD-10-CM | POA: Insufficient documentation

## 2023-09-25 DIAGNOSIS — Z720 Tobacco use: Secondary | ICD-10-CM

## 2023-09-25 DIAGNOSIS — I509 Heart failure, unspecified: Secondary | ICD-10-CM | POA: Insufficient documentation

## 2023-09-25 LAB — COMPREHENSIVE METABOLIC PANEL
ALT: 13 U/L (ref 0–44)
AST: 16 U/L (ref 15–41)
Albumin: 3.1 g/dL — ABNORMAL LOW (ref 3.5–5.0)
Alkaline Phosphatase: 66 U/L (ref 38–126)
Anion gap: 12 (ref 5–15)
BUN: 28 mg/dL — ABNORMAL HIGH (ref 6–20)
CO2: 21 mmol/L — ABNORMAL LOW (ref 22–32)
Calcium: 8.8 mg/dL — ABNORMAL LOW (ref 8.9–10.3)
Chloride: 98 mmol/L (ref 98–111)
Creatinine, Ser: 2.32 mg/dL — ABNORMAL HIGH (ref 0.44–1.00)
GFR, Estimated: 25 mL/min — ABNORMAL LOW (ref >60)
Glucose, Bld: 167 mg/dL — ABNORMAL HIGH (ref 70–99)
Potassium: 4.8 mmol/L (ref 3.5–5.1)
Sodium: 131 mmol/L — ABNORMAL LOW (ref 135–145)
Total Bilirubin: 0.3 mg/dL (ref 0.0–1.2)
Total Protein: 7.6 g/dL (ref 6.5–8.1)

## 2023-09-25 LAB — CBC WITH DIFFERENTIAL/PLATELET
Abs Immature Granulocytes: 0.06 10*3/uL (ref 0.00–0.07)
Basophils Absolute: 0.1 10*3/uL (ref 0.0–0.1)
Basophils Relative: 1 %
Eosinophils Absolute: 0.5 10*3/uL (ref 0.0–0.5)
Eosinophils Relative: 4 %
HCT: 37.5 % (ref 36.0–46.0)
Hemoglobin: 10.6 g/dL — ABNORMAL LOW (ref 12.0–15.0)
Immature Granulocytes: 1 %
Lymphocytes Relative: 23 %
Lymphs Abs: 2.5 10*3/uL (ref 0.7–4.0)
MCH: 24 pg — ABNORMAL LOW (ref 26.0–34.0)
MCHC: 28.3 g/dL — ABNORMAL LOW (ref 30.0–36.0)
MCV: 84.8 fL (ref 80.0–100.0)
Monocytes Absolute: 1 10*3/uL (ref 0.1–1.0)
Monocytes Relative: 9 %
Neutro Abs: 6.8 10*3/uL (ref 1.7–7.7)
Neutrophils Relative %: 62 %
Platelets: 481 10*3/uL — ABNORMAL HIGH (ref 150–400)
RBC: 4.42 MIL/uL (ref 3.87–5.11)
RDW: 21.5 % — ABNORMAL HIGH (ref 11.5–15.5)
Smear Review: ADEQUATE
WBC: 10.9 10*3/uL — ABNORMAL HIGH (ref 4.0–10.5)
nRBC: 0 % (ref 0.0–0.2)

## 2023-09-25 LAB — VITAMIN B12: Vitamin B-12: 262 pg/mL (ref 180–914)

## 2023-09-25 LAB — IRON AND TIBC
Iron: 24 ug/dL — ABNORMAL LOW (ref 28–170)
Saturation Ratios: 6 % — ABNORMAL LOW (ref 10.4–31.8)
TIBC: 397 ug/dL (ref 250–450)
UIBC: 373 ug/dL

## 2023-09-25 LAB — FERRITIN: Ferritin: 90 ng/mL (ref 11–307)

## 2023-09-25 LAB — FOLATE: Folate: 4.7 ng/mL — ABNORMAL LOW (ref >5.9)

## 2023-09-25 NOTE — Assessment & Plan Note (Signed)
 Patient had a recent hemoglobin of 10.9 from primary care in 09/24.  Patient previously was iron deficient.  Unsure of last endoscopy and colonoscopy. -Will repeat labs today including nutritional studies to rule out causes of anemia. -Will replenish iron both IV and p.o. if iron deficient. -Will reach out to GI Dr. Marletta Lor to schedule endoscopy and colonoscopy to rule out potential causes of iron deficiency.  Return to clinic in 6 weeks with labs

## 2023-09-25 NOTE — Progress Notes (Signed)
 [facility] at [facility] HEMATOLOGY NEW VISIT  Alvina Filbert, MD  REASON FOR REFERRAL: Microcytic anemia  HISTORY OF PRESENT ILLNESS: [name] 53 y.o. female referred for microcytic anemia.  Patient has a past medical history of heart failure and iron deficiency anemia.  Patient was admitted to the hospital last year and during that time received IV ferrous gluconate 250 mg.  She was supposed to follow-up with hematology but never did.  Today, patient reports pain in her bilateral feet.  She does report some fatigue and dyspnea on exertion.  She has no other complaints today.  She denies weight loss, loss of appetite, abdominal pain.  She also denies melena or blood in her stools.  In addition to these primary concerns, the patient also mentions a recent bout of pneumonia, for which she visited the ER. She also had an abscess on her eye, which was lanced and drained. She has not had a colonoscopy or endoscopy in a long time and expresses a desire to schedule these procedures.   The patient is a smoker, consuming about half a pack a day, and has been smoking since she was [age]. She does not drink alcohol and there is no family history of colon cancer.  I have reviewed the past medical history, past surgical history, social history and family history with the patient   ALLERGIES:  is allergic to tramadol hcl, ketorolac, and propoxyphene.  MEDICATIONS:  Current Outpatient Medications  Medication Sig Dispense Refill   acetaminophen (TYLENOL) 325 MG tablet Take 650 mg by mouth every 6 (six) hours as needed for moderate pain.     albuterol (PROVENTIL) (2.5 MG/3ML) 0.083% nebulizer solution USE ONE VIAL IN NEBULIZER EVERY 6 HOURS AS NEEDED     Buprenorphine HCl-Naloxone HCl 8-2 MG FILM Place 1 Film under the tongue 3 (three) times daily.     busPIRone (BUSPAR) 7.5 MG tablet Take 7.5 mg by mouth 2 (two) times daily.     citalopram (CELEXA) 40 MG tablet Take  40 mg by mouth daily.     empagliflozin (JARDIANCE) 10 MG TABS tablet TAKE ONE TABLET BY MOUTH ONCE DAILY 90 tablet 2   ferrous sulfate 324 MG TBEC Take 1 tablet (324 mg total) by mouth daily with breakfast. 30 tablet 3   Ipratropium-Albuterol (COMBIVENT RESPIMAT) 20-100 MCG/ACT AERS respimat Inhale 1 puff into the lungs every 6 (six) hours as needed for wheezing or shortness of breath. 4 g 1   lisinopril-hydrochlorothiazide (ZESTORETIC) 10-12.5 MG tablet Take 1 tablet by mouth daily.     mirtazapine (REMERON) 15 MG tablet Take 15 mg by mouth at bedtime.     pantoprazole (PROTONIX) 40 MG tablet Take 1 tablet (40 mg total) by mouth daily before breakfast. 30 tablet 3   potassium chloride (KLOR-CON M) 10 MEQ tablet Take 10 mEq by mouth 2 (two) times daily.     torsemide (DEMADEX) 20 MG tablet Take 1 tablet (20 mg total) by mouth daily. 30 tablet 1   No current facility-administered medications for this visit.     REVIEW OF SYSTEMS:   Constitutional: Denies fevers, chills or night sweats Eyes: Denies blurriness of vision Ears, nose, mouth, throat, and face: Denies mucositis or sore throat Respiratory: Denies cough, dyspnea or wheezes Cardiovascular: Denies palpitation, chest discomfort or lower extremity swelling Gastrointestinal:  Denies nausea, heartburn or change in bowel habits Skin: Denies abnormal skin rashes Lymphatics: Denies new lymphadenopathy or easy bruising Neurological:Denies numbness, tingling or new  weaknesses Behavioral/Psych: Mood is stable, no new changes  All other systems were reviewed with the patient and are negative.  PHYSICAL EXAMINATION:   Vitals:   09/25/23 1427  BP: 104/64  Pulse: 63  Resp: 17  Temp: (!) 97 F (36.1 C)  SpO2: 94%    GENERAL:alert, no distress and comfortable SKIN: skin color, texture, turgor are normal, no rashes or significant lesions LUNGS: clear to auscultation and percussion with normal breathing effort HEART: regular rate &  rhythm and no murmurs and no lower extremity edema ABDOMEN:abdomen soft, non-tender and normal bowel sounds Musculoskeletal:no cyanosis of digits and no clubbing  NEURO: alert & oriented x 3 with fluent speech  LABORATORY DATA:  I have reviewed the data as listed and labs from primary care from 09/24: CBC: WBC: 12.5, hemoglobin: 10.9, hematocrit: 36, MCV: 77.9 platelets: 299  Lab Results  Component Value Date   WBC 10.9 (H) 09/25/2023   NEUTROABS PENDING 09/25/2023   HGB 10.6 (L) 09/25/2023   HCT 37.5 09/25/2023   MCV 84.8 09/25/2023   PLT 481 (H) 09/25/2023      Component Value Date/Time   NA 131 (L) 09/25/2023 1452   K 4.8 09/25/2023 1452   CL 98 09/25/2023 1452   CO2 21 (L) 09/25/2023 1452   GLUCOSE 167 (H) 09/25/2023 1452   BUN 28 (H) 09/25/2023 1452   CREATININE 2.32 (H) 09/25/2023 1452   CALCIUM 8.8 (L) 09/25/2023 1452   PROT 7.6 09/25/2023 1452   ALBUMIN 3.1 (L) 09/25/2023 1452   AST 16 09/25/2023 1452   ALT 13 09/25/2023 1452   ALKPHOS 66 09/25/2023 1452   BILITOT 0.3 09/25/2023 1452   GFRNONAA 25 (L) 09/25/2023 1452   GFRAA >60 10/06/2017 0507       Chemistry      Component Value Date/Time   NA 131 (L) 09/25/2023 1452   K 4.8 09/25/2023 1452   CL 98 09/25/2023 1452   CO2 21 (L) 09/25/2023 1452   BUN 28 (H) 09/25/2023 1452   CREATININE 2.32 (H) 09/25/2023 1452      Component Value Date/Time   CALCIUM 8.8 (L) 09/25/2023 1452   ALKPHOS 66 09/25/2023 1452   AST 16 09/25/2023 1452   ALT 13 09/25/2023 1452   BILITOT 0.3 09/25/2023 1452      Latest Reference Range & Units 11/07/22 05:03  Iron 28 - 170 ug/dL 23 (L)  UIBC ug/dL 562  TIBC 130 - 865 ug/dL 784 (H)  Saturation Ratios 10.4 - 31.8 % 5 (L)  Ferritin 11 - 307 ng/mL 12  Folate >5.9 ng/mL 11.0  (L): Data is abnormally low (H): Data is abnormally high  ASSESSMENT & PLAN:  Patient is a 53 year old female presenting with microcytic anemia.   Microcytic anemia Patient had a recent hemoglobin  of 10.9 from primary care in 09/24.  Patient previously was iron deficient.  Unsure of last endoscopy and colonoscopy. -Will repeat labs today including nutritional studies to rule out causes of anemia. -Will replenish iron both IV and p.o. if iron deficient. -Will reach out to GI Dr. Marletta Lor to schedule endoscopy and colonoscopy to rule out potential causes of iron deficiency.  Return to clinic in 6 weeks with labs  Tobacco use Patient reports smoking half a pack a day since age 20. -Encourage continued efforts to cut down and eventually quit smoking.   Orders Placed This Encounter  Procedures   Ferritin    Standing Status:   Future    Number of  Occurrences:   1    Expected Date:   09/25/2023    Expiration Date:   09/24/2024   Folate    Standing Status:   Future    Number of Occurrences:   1    Expected Date:   09/25/2023    Expiration Date:   09/24/2024   Vitamin B12    Standing Status:   Future    Number of Occurrences:   1    Expected Date:   09/25/2023    Expiration Date:   09/24/2024   Comprehensive metabolic panel    Standing Status:   Future    Number of Occurrences:   1    Expected Date:   09/25/2023    Expiration Date:   09/24/2024   CBC with Differential/Platelet    Standing Status:   Future    Number of Occurrences:   1    Expected Date:   09/25/2023    Expiration Date:   09/24/2024   Iron and TIBC    Standing Status:   Future    Number of Occurrences:   1    Expected Date:   09/25/2023    Expiration Date:   09/24/2024    The total time spent in the appointment was 40 minutes encounter with patients including review of chart and various tests results, discussions about plan of care and coordination of care plan   All questions were answered. The patient knows to call the clinic with any problems, questions or concerns. No barriers to learning was detected.   Cindie Crumbly, MD 3/6/20253:53 PM

## 2023-09-25 NOTE — Assessment & Plan Note (Signed)
 Patient reports smoking half a pack a day since age 53. -Encourage continued efforts to cut down and eventually quit smoking.

## 2023-09-25 NOTE — Patient Instructions (Signed)
 VISIT SUMMARY:  During today's visit, we discussed your anemia, foot pain, recent pneumonia, eye abscess, and smoking habits. We also talked about scheduling a colonoscopy and endoscopy since you haven't had these procedures in a long time.  YOUR PLAN:  -ANEMIA: Anemia is a condition where you don't have enough healthy red blood cells to carry adequate oxygen to your body's tissues, which can make you feel tired and short of breath. We will order updated labs to check your current status and may start iron supplementation based on the results.  -GASTROENTEROLOGY FOLLOW-UP: Since you haven't had a recent colonoscopy or endoscopy, we will contact a gastroenterologist to arrange these procedures to ensure your digestive health is monitored.  -FOOT PAIN: Your severe foot pain might be due to neuropathy, which is nerve damage that can cause pain and discomfort. We will check your Vitamin B12 levels to see if a deficiency might be causing this and recommend a follow-up with your primary care provider for further evaluation.  -SMOKING: Smoking can have many harmful effects on your health. We encourage you to continue your efforts to cut down and eventually quit smoking.  INSTRUCTIONS:  Please follow up in 6 weeks to assess your response to potential iron supplementation and other interventions. We will also arrange for your colonoscopy and endoscopy.  For more information, you can read your full clinical note, available in your patient portal.

## 2023-09-25 NOTE — Telephone Encounter (Signed)
 Patient needs to be scheduled for a follow-up with me for IDA to discuss scheduling procedures.

## 2023-09-26 ENCOUNTER — Other Ambulatory Visit: Payer: Self-pay | Admitting: Oncology

## 2023-09-26 MED ORDER — FERROUS SULFATE 325 (65 FE) MG PO TBEC: 325.0000 mg | DELAYED_RELEASE_TABLET | ORAL | 3 refills | Status: DC

## 2023-09-26 MED ORDER — FOLIC ACID 1 MG PO TABS: 1.0000 mg | ORAL_TABLET | Freq: Every day | ORAL | 2 refills | Status: AC

## 2023-09-26 MED ORDER — VITAMIN B-12 1000 MCG PO TABS
1000.0000 ug | ORAL_TABLET | Freq: Every day | ORAL | 2 refills | Status: AC
Start: 2023-09-26 — End: ?

## 2023-09-26 NOTE — Progress Notes (Signed)
 Patient called about results for blood work and aware of medication sent to pharmacy.

## 2023-10-19 NOTE — Progress Notes (Deleted)
 Referring Provider: Alvina Filbert, MD Primary Care Physician:  Alvina Filbert, MD Primary GI Physician: Dr. Bonnetta Barry chief complaint on file.   HPI:   [name] is a 53 y.o. female presenting today to discuss rescheduling procedures for evaluation of IDA.  Patient was admitted to the hospital 11/06/22-11/11/22  with acute hypoxemic respiratory failure-multifactorial in the setting of new onset CHF, community-acquired pneumonia, and possible COPD exacerbation.  She was found to have moderate mitral regurgitation.  She was also found to have iron deficiency anemia with hemoglobin 7.8 on day of admission, iron 23, saturation 5%, ferritin 12.  She received IV iron while inpatient.  No overt bleeding noted, and Hemoglobin remained stable.  Recommended outpatient follow-up with cardiology and GI.   I saw her at the time of initial consult 11/27/2022.  Denied overt GI bleeding or other obvious bleeding.  No prior colonoscopy.  Noted chronic GERD, previously well-controlled on omeprazole, but ran out 3 weeks ago.  Also with new onset dysphagia symptoms since running out of omeprazole.  She was taking Aleve 2-3 times a week for back pain and prior to this had been taking Goody powders for years.  Still taking about once a week.  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  Plan included EGD, possible esophageal dilation, colonoscopy, hepatitis C RNA, abdominal ultrasound with elastography, resume omeprazole daily, iron daily, avoid NSAIDs.  Hep C RNA nor ultrasound were completed.  Patient was scheduled for procedure 6/19, then moved to 7/1 due to her request.  Procedure on 7/1 was canceled due to provider being out.  Patient stated she would have to call back to reschedule.  On 01/26/2023, omeprazole was changed to pantoprazole 40 mg daily due to potential interaction with citalopram.    Today:     Most recent labs 09/25/2023 with hemoglobin  10.6, folate low at 4.7, vitamin B12 low normal at 262, iron low at 24, iron saturation low at 6%, ferritin 90.  Creatinine elevated at 2.32.  She will started on oral iron every other day, folic acid daily, vitamin B12 1000 mcg daily.  Past Medical History:  Diagnosis Date   Anxiety    Arthritis    Cancer (HCC)    Cervical cancer (HCC)    Depressed    Diastolic heart failure (HCC)    Grade 2   Drug addiction (HCC) Hep C   Foot abscess, left    Gastroesophageal reflux disease    Gastroparesis    Hepatitis C    pt denies any treatment   Hypertension    Mitral regurgitation    Moderate   Polysubstance dependence including opioid type drug, episodic abuse (HCC)    Psoriasis    Renal disorder    kidney stones    Past Surgical History:  Procedure Laterality Date   APPENDECTOMY     APPLICATION OF WOUND VAC Left 10/10/2017   Procedure: APPLICATION OF WOUND VAC;  Surgeon: Nadara Mustard, MD;  Location: [facility] OR;  Service: Orthopedics;  Laterality: Left;   ESOPHAGOGASTRODUODENOSCOPY  01/04/2008   Dr Teddy Spike reflux esophagitis, sm HH, gastritis, BRAVO positive for GERD on BID PPI   I & D EXTREMITY Left 10/03/2017   Procedure: IRRIGATION AND DEBRIDEMENT EXTREMITY;  Surgeon: Nadara Mustard, MD;  Location: [facility] OR;  Service: Orthopedics;  Laterality: Left;   IRRIGATION AND DEBRIDEMENT FOOT Left 10/03/2017   LASER ABLATION CONDYLOMA CERVICAL / VULVAR  mandible surgery for fracture     SKIN SPLIT GRAFT Left 10/10/2017   Procedure: SKIN GRAFT SPLIT THICKNESS LEFT FOOT;  Surgeon: Nadara Mustard, MD;  Location: [facility] OR;  Service: Orthopedics;  Laterality: Left;   TEE WITHOUT CARDIOVERSION N/A 11/11/2022   Procedure: TRANSESOPHAGEAL ECHOCARDIOGRAM (TEE);  Surgeon: Marjo Bicker, MD;  Location: [facility] ORS;  Service: Cardiovascular;  Laterality: N/A;   TUBAL LIGATION      Current Outpatient Medications  Medication Sig Dispense Refill   acetaminophen (TYLENOL) 325 MG tablet Take 650 mg by  mouth every 6 (six) hours as needed for moderate pain.     albuterol (PROVENTIL) (2.5 MG/3ML) 0.083% nebulizer solution USE ONE VIAL IN NEBULIZER EVERY 6 HOURS AS NEEDED     Buprenorphine HCl-Naloxone HCl 8-2 MG FILM Place 1 Film under the tongue 3 (three) times daily.     busPIRone (BUSPAR) 7.5 MG tablet Take 7.5 mg by mouth 2 (two) times daily.     citalopram (CELEXA) 40 MG tablet Take 40 mg by mouth daily.     cyanocobalamin (VITAMIN B12) 1000 MCG tablet Take 1 tablet (1,000 mcg total) by mouth daily. 90 tablet 2   empagliflozin (JARDIANCE) 10 MG TABS tablet TAKE ONE TABLET BY MOUTH ONCE DAILY 90 tablet 2   ferrous sulfate 325 (65 FE) MG EC tablet Take 1 tablet (325 mg total) by mouth every other day. 45 tablet 3   folic acid (FOLVITE) 1 MG tablet Take 1 tablet (1 mg total) by mouth daily. 90 tablet 2   Ipratropium-Albuterol (COMBIVENT RESPIMAT) 20-100 MCG/ACT AERS respimat Inhale 1 puff into the lungs every 6 (six) hours as needed for wheezing or shortness of breath. 4 g 1   lisinopril-hydrochlorothiazide (ZESTORETIC) 10-12.5 MG tablet Take 1 tablet by mouth daily.     mirtazapine (REMERON) 15 MG tablet Take 15 mg by mouth at bedtime.     pantoprazole (PROTONIX) 40 MG tablet Take 1 tablet (40 mg total) by mouth daily before breakfast. 30 tablet 3   potassium chloride (KLOR-CON M) 10 MEQ tablet Take 10 mEq by mouth 2 (two) times daily.     torsemide (DEMADEX) 20 MG tablet Take 1 tablet (20 mg total) by mouth daily. 30 tablet 1   No current facility-administered medications for this visit.    Allergies as of 10/20/2023 - Review Complete 09/25/2023  Allergen Reaction Noted   Tramadol hcl Anaphylaxis, Hives, Itching, and Swelling    Ketorolac Nausea And Vomiting, Rash, and Other (See Comments) 03/06/2012   Propoxyphene Rash 10/06/2013    Family History  Problem Relation Age of Onset   Hyperlipidemia Mother    Diabetes Mother    Hypertension Mother    Hypertension Sister    Hepatitis C  Sister    Hypertension Father    Colon cancer Neg Hx    Liver disease Neg Hx     Social History   Socioeconomic History   Marital status: Divorced    Spouse name: Not on file   Number of children: 2   Years of education: Not on file   Highest education level: Not on file  Occupational History   Occupation: disabled    Employer: UNEMPLOYED  Tobacco Use   Smoking status: Every Day    Current packs/day: 1.00    Average packs/day: 1 pack/day for 28.0 years (28.0 ttl pk-yrs)    Types: Cigarettes   Smokeless tobacco: Never  Vaping Use   Vaping status: Never Used  Substance and Sexual  Activity   Alcohol use: No    Comment: occ ( denies 3/ 21/19)   Drug use: Yes    Types: Cocaine, IV, "Crack" cocaine, Marijuana, Opium, Heroin    Comment: Clean for 5 years (documented May 2024)   Sexual activity: Not Currently    Birth control/protection: None, Surgical  Other Topics Concern   Not on file  Social History Narrative   Lives w/ mother & 2 children (17 &13)   Social Drivers of Corporate investment banker Strain: Not on file  Food Insecurity: No Food Insecurity (11/18/2022)   Hunger Vital Sign    Worried About Running Out of Food in the Last Year: Never true    Ran Out of Food in the Last Year: Never true  Transportation Needs: No Transportation Needs (11/18/2022)   PRAPARE - Administrator, Civil Service (Medical): No    Lack of Transportation (Non-Medical): No  Physical Activity: Not on file  Stress: Not on file  Social Connections: Not on file    Review of Systems: Gen: Denies fever, chills, anorexia. Denies fatigue, weakness, weight loss.  CV: Denies chest pain, palpitations, syncope, peripheral edema, and claudication. Resp: Denies dyspnea at rest, cough, wheezing, coughing up blood, and pleurisy. GI: Denies vomiting blood, jaundice, and fecal incontinence.   Denies dysphagia or odynophagia. Derm: Denies rash, itching, dry skin Psych: Denies depression,  anxiety, memory loss, confusion. No homicidal or suicidal ideation.  Heme: Denies bruising, bleeding, and enlarged lymph nodes.  Physical Exam: LMP 01/20/2015  General:   Alert and oriented. No distress noted. Pleasant and cooperative.  Head:  Normocephalic and atraumatic. Eyes:  Conjuctiva clear without scleral icterus. Heart:  S1, S2 present without murmurs appreciated. Lungs:  Clear to auscultation bilaterally. No wheezes, rales, or rhonchi. No distress.  Abdomen:  +BS, soft, non-tender and non-distended. No rebound or guarding. No HSM or masses noted. Msk:  Symmetrical without gross deformities. Normal posture. Extremities:  Without edema. Neurologic:  Alert and  oriented x4 Psych:  Normal mood and affect.    Assessment:     Plan:  ***   Ermalinda Memos, PA-C [facility]astro[facility] 10/20/2023

## 2023-10-20 ENCOUNTER — Ambulatory Visit: Admitting: Gastroenterology

## 2023-10-24 NOTE — Progress Notes (Deleted)
 Referring Provider: Alvina Filbert, MD Primary Care Physician:  Alvina Filbert, MD Primary GI Physician: Dr. Bonnetta Barry chief complaint on file.   HPI:   [name] is a 53 y.o. female presenting today to discuss rescheduling procedures for evaluation of IDA.   Patient was admitted to the hospital 11/06/22-11/11/22  with acute hypoxemic respiratory failure-multifactorial in the setting of new onset CHF, community-acquired pneumonia, and possible COPD exacerbation.  She was found to have moderate mitral regurgitation.  She was also found to have iron deficiency anemia with hemoglobin 7.8 on day of admission, iron 23, saturation 5%, ferritin 12.  She received IV iron while inpatient.  No overt bleeding noted, and Hemoglobin remained stable.  Recommended outpatient follow-up with cardiology and GI.    I saw her at the time of initial consult 11/27/2022.  Denied overt GI bleeding or other obvious bleeding.  No prior colonoscopy.  Noted chronic GERD, previously well-controlled on omeprazole, but ran out 3 weeks ago.  Also with new onset dysphagia symptoms since running out of omeprazole.  She was taking Aleve 2-3 times a week for back pain and prior to this had been taking Goody powders for years.  Still taking about once a week.  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  Plan included EGD, possible esophageal dilation, colonoscopy, hepatitis C RNA, abdominal ultrasound with elastography, resume omeprazole daily, iron daily, avoid NSAIDs.   Hep C RNA nor ultrasound were completed.   Patient was scheduled for procedure 6/19, then moved to 7/1 due to her request.  Procedure on 7/1 was canceled due to provider being out.  Patient stated she would have to call back to reschedule.   On 01/26/2023, omeprazole was changed to pantoprazole 40 mg daily due to potential interaction with citalopram.       Today:         Most recent labs 09/25/2023 with  hemoglobin 10.6, folate low at 4.7, vitamin B12 low normal at 262, iron low at 24, iron saturation low at 6%, ferritin 90.  Creatinine elevated at 2.32.  She will started on oral iron every other day, folic acid daily, vitamin B12 1000 mcg daily.    Past Medical History:  Diagnosis Date   Anxiety    Arthritis    Cancer (HCC)    Cervical cancer (HCC)    Depressed    Diastolic heart failure (HCC)    Grade 2   Drug addiction (HCC) Hep C   Foot abscess, left    Gastroesophageal reflux disease    Gastroparesis    Hepatitis C    pt denies any treatment   Hypertension    Mitral regurgitation    Moderate   Polysubstance dependence including opioid type drug, episodic abuse (HCC)    Psoriasis    Renal disorder    kidney stones    Past Surgical History:  Procedure Laterality Date   APPENDECTOMY     APPLICATION OF WOUND VAC Left 10/10/2017   Procedure: APPLICATION OF WOUND VAC;  Surgeon: Nadara Mustard, MD;  Location: [facility] OR;  Service: Orthopedics;  Laterality: Left;   ESOPHAGOGASTRODUODENOSCOPY  01/04/2008   Dr Teddy Spike reflux esophagitis, sm HH, gastritis, BRAVO positive for GERD on BID PPI   I & D EXTREMITY Left 10/03/2017   Procedure: IRRIGATION AND DEBRIDEMENT EXTREMITY;  Surgeon: Nadara Mustard, MD;  Location: [facility] OR;  Service: Orthopedics;  Laterality: Left;   IRRIGATION  AND DEBRIDEMENT FOOT Left 10/03/2017   LASER ABLATION CONDYLOMA CERVICAL / VULVAR     mandible surgery for fracture     SKIN SPLIT GRAFT Left 10/10/2017   Procedure: SKIN GRAFT SPLIT THICKNESS LEFT FOOT;  Surgeon: Nadara Mustard, MD;  Location[facility] OR;  Service: Orthopedics;  Laterality: Left;   TEE WITHOUT CARDIOVERSION N/A 11/11/2022   Procedure: TRANSESOPHAGEAL ECHOCARDIOGRAM (TEE);  Surgeon: Marjo Bicker, MD;  Location: [facility] ORS;  Service: Cardiovascular;  Laterality: N/A;   TUBAL LIGATION      Current Outpatient Medications  Medication Sig Dispense Refill   acetaminophen (TYLENOL) 325 MG tablet  Take 650 mg by mouth every 6 (six) hours as needed for moderate pain.     albuterol (PROVENTIL) (2.5 MG/3ML) 0.083% nebulizer solution USE ONE VIAL IN NEBULIZER EVERY 6 HOURS AS NEEDED     Buprenorphine HCl-Naloxone HCl 8-2 MG FILM Place 1 Film under the tongue 3 (three) times daily.     busPIRone (BUSPAR) 7.5 MG tablet Take 7.5 mg by mouth 2 (two) times daily.     citalopram (CELEXA) 40 MG tablet Take 40 mg by mouth daily.     cyanocobalamin (VITAMIN B12) 1000 MCG tablet Take 1 tablet (1,000 mcg total) by mouth daily. 90 tablet 2   empagliflozin (JARDIANCE) 10 MG TABS tablet TAKE ONE TABLET BY MOUTH ONCE DAILY 90 tablet 2   ferrous sulfate 325 (65 FE) MG EC tablet Take 1 tablet (325 mg total) by mouth every other day. 45 tablet 3   folic acid (FOLVITE) 1 MG tablet Take 1 tablet (1 mg total) by mouth daily. 90 tablet 2   Ipratropium-Albuterol (COMBIVENT RESPIMAT) 20-100 MCG/ACT AERS respimat Inhale 1 puff into the lungs every 6 (six) hours as needed for wheezing or shortness of breath. 4 g 1   lisinopril-hydrochlorothiazide (ZESTORETIC) 10-12.5 MG tablet Take 1 tablet by mouth daily.     mirtazapine (REMERON) 15 MG tablet Take 15 mg by mouth at bedtime.     pantoprazole (PROTONIX) 40 MG tablet Take 1 tablet (40 mg total) by mouth daily before breakfast. 30 tablet 3   potassium chloride (KLOR-CON M) 10 MEQ tablet Take 10 mEq by mouth 2 (two) times daily.     torsemide (DEMADEX) 20 MG tablet Take 1 tablet (20 mg total) by mouth daily. 30 tablet 1   No current facility-administered medications for this visit.    Allergies as of 10/27/2023 - Review Complete 09/25/2023  Allergen Reaction Noted   Tramadol hcl Anaphylaxis, Hives, Itching, and Swelling    Ketorolac Nausea And Vomiting, Rash, and Other (See Comments) 03/06/2012   Propoxyphene Rash 10/06/2013    Family History  Problem Relation Age of Onset   Hyperlipidemia Mother    Diabetes Mother    Hypertension Mother    Hypertension Sister     Hepatitis C Sister    Hypertension Father    Colon cancer Neg Hx    Liver disease Neg Hx     Social History   Socioeconomic History   Marital status: Divorced    Spouse name: Not on file   Number of children: 2   Years of education: Not on file   Highest education level: Not on file  Occupational History   Occupation: disabled    Employer: UNEMPLOYED  Tobacco Use   Smoking status: Every Day    Current packs/day: 1.00    Average packs/day: 1 pack/day for 28.0 years (28.0 ttl pk-yrs)    Types: Cigarettes  Smokeless tobacco: Never  Vaping Use   Vaping status: Never Used  Substance and Sexual Activity   Alcohol use: No    Comment: occ ( denies 3/ 21/19)   Drug use: Yes    Types: Cocaine, IV, "Crack" cocaine, Marijuana, Opium, Heroin    Comment: Clean for 5 years (documented May 2024)   Sexual activity: Not Currently    Birth control/protection: None, Surgical  Other Topics Concern   Not on file  Social History Narrative   Lives w/ mother & 2 children (17 &13)   Social Drivers of Corporate investment banker Strain: Not on file  Food Insecurity: No Food Insecurity (11/18/2022)   Hunger Vital Sign    Worried About Running Out of Food in the Last Year: Never true    Ran Out of Food in the Last Year: Never true  Transportation Needs: No Transportation Needs (11/18/2022)   PRAPARE - Administrator, Civil Service (Medical): No    Lack of Transportation (Non-Medical): No  Physical Activity: Not on file  Stress: Not on file  Social Connections: Not on file    Review of Systems: Gen: Denies fever, chills, anorexia. Denies fatigue, weakness, weight loss.  CV: Denies chest pain, palpitations, syncope, peripheral edema, and claudication. Resp: Denies dyspnea at rest, cough, wheezing, coughing up blood, and pleurisy. GI: Denies vomiting blood, jaundice, and fecal incontinence.   Denies dysphagia or odynophagia. Derm: Denies rash, itching, dry skin Psych: Denies  depression, anxiety, memory loss, confusion. No homicidal or suicidal ideation.  Heme: Denies bruising, bleeding, and enlarged lymph nodes.  Physical Exam: LMP 01/20/2015  General:   Alert and oriented. No distress noted. Pleasant and cooperative.  Head:  Normocephalic and atraumatic. Eyes:  Conjuctiva clear without scleral icterus. Heart:  S1, S2 present without murmurs appreciated. Lungs:  Clear to auscultation bilaterally. No wheezes, rales, or rhonchi. No distress.  Abdomen:  +BS, soft, non-tender and non-distended. No rebound or guarding. No HSM or masses noted. Msk:  Symmetrical without gross deformities. Normal posture. Extremities:  Without edema. Neurologic:  Alert and  oriented x4 Psych:  Normal mood and affect.    Assessment:     Plan:  ***   Ermalinda Memos, PA-C [facility] 10/27/2023

## 2023-10-27 ENCOUNTER — Ambulatory Visit: Admitting: Gastroenterology

## 2023-10-28 ENCOUNTER — Encounter: Payer: Self-pay | Admitting: Gastroenterology

## 2023-10-29 ENCOUNTER — Other Ambulatory Visit: Payer: Self-pay

## 2023-10-29 DIAGNOSIS — D509 Iron deficiency anemia, unspecified: Secondary | ICD-10-CM

## 2023-10-29 DIAGNOSIS — D649 Anemia, unspecified: Secondary | ICD-10-CM

## 2023-10-30 ENCOUNTER — Inpatient Hospital Stay: Attending: Oncology

## 2023-10-30 DIAGNOSIS — Z79899 Other long term (current) drug therapy: Secondary | ICD-10-CM | POA: Diagnosis not present

## 2023-10-30 DIAGNOSIS — D649 Anemia, unspecified: Secondary | ICD-10-CM

## 2023-10-30 DIAGNOSIS — D509 Iron deficiency anemia, unspecified: Secondary | ICD-10-CM | POA: Insufficient documentation

## 2023-10-30 LAB — CBC WITH DIFFERENTIAL/PLATELET
Abs Immature Granulocytes: 0.03 10*3/uL (ref 0.00–0.07)
Basophils Absolute: 0.1 10*3/uL (ref 0.0–0.1)
Basophils Relative: 1 %
Eosinophils Absolute: 0.3 10*3/uL (ref 0.0–0.5)
Eosinophils Relative: 3 %
HCT: 42.7 % (ref 36.0–46.0)
Hemoglobin: 12.7 g/dL (ref 12.0–15.0)
Immature Granulocytes: 0 %
Lymphocytes Relative: 31 %
Lymphs Abs: 2.8 10*3/uL (ref 0.7–4.0)
MCH: 25.9 pg — ABNORMAL LOW (ref 26.0–34.0)
MCHC: 29.7 g/dL — ABNORMAL LOW (ref 30.0–36.0)
MCV: 87 fL (ref 80.0–100.0)
Monocytes Absolute: 0.7 10*3/uL (ref 0.1–1.0)
Monocytes Relative: 7 %
Neutro Abs: 5.3 10*3/uL (ref 1.7–7.7)
Neutrophils Relative %: 58 %
Platelets: 494 10*3/uL — ABNORMAL HIGH (ref 150–400)
RBC: 4.91 MIL/uL (ref 3.87–5.11)
RDW: 22.3 % — ABNORMAL HIGH (ref 11.5–15.5)
WBC: 9.1 10*3/uL (ref 4.0–10.5)
nRBC: 0 % (ref 0.0–0.2)

## 2023-10-30 LAB — IRON AND TIBC
Iron: 34 ug/dL (ref 28–170)
Saturation Ratios: 8 % — ABNORMAL LOW (ref 10.4–31.8)
TIBC: 434 ug/dL (ref 250–450)
UIBC: 400 ug/dL

## 2023-10-30 LAB — COMPREHENSIVE METABOLIC PANEL WITH GFR
ALT: 20 U/L (ref 0–44)
AST: 26 U/L (ref 15–41)
Albumin: 3.8 g/dL (ref 3.5–5.0)
Alkaline Phosphatase: 62 U/L (ref 38–126)
Anion gap: 10 (ref 5–15)
BUN: 19 mg/dL (ref 6–20)
CO2: 27 mmol/L (ref 22–32)
Calcium: 9.6 mg/dL (ref 8.9–10.3)
Chloride: 98 mmol/L (ref 98–111)
Creatinine, Ser: 1.22 mg/dL — ABNORMAL HIGH (ref 0.44–1.00)
GFR, Estimated: 53 mL/min — ABNORMAL LOW (ref >60)
Glucose, Bld: 128 mg/dL — ABNORMAL HIGH (ref 70–99)
Potassium: 4.1 mmol/L (ref 3.5–5.1)
Sodium: 135 mmol/L (ref 135–145)
Total Bilirubin: 0.6 mg/dL (ref 0.0–1.2)
Total Protein: 8.3 g/dL — ABNORMAL HIGH (ref 6.5–8.1)

## 2023-10-30 LAB — FERRITIN: Ferritin: 26 ng/mL (ref 11–307)

## 2023-10-30 LAB — VITAMIN B12: Vitamin B-12: 1084 pg/mL — ABNORMAL HIGH (ref 180–914)

## 2023-10-30 LAB — FOLATE: Folate: >40 ng/mL (ref >5.9)

## 2023-11-05 ENCOUNTER — Inpatient Hospital Stay

## 2023-11-06 ENCOUNTER — Inpatient Hospital Stay: Admitting: Oncology

## 2023-11-10 ENCOUNTER — Inpatient Hospital Stay

## 2023-11-10 VITALS — BP 148/93 | HR 61 | Temp 97.9°F | Resp 18

## 2023-11-10 DIAGNOSIS — D500 Iron deficiency anemia secondary to blood loss (chronic): Secondary | ICD-10-CM

## 2023-11-10 DIAGNOSIS — D509 Iron deficiency anemia, unspecified: Secondary | ICD-10-CM | POA: Diagnosis not present

## 2023-11-10 MED ORDER — SODIUM CHLORIDE 0.9 % IV SOLN: INTRAVENOUS | Status: DC

## 2023-11-10 MED ORDER — CETIRIZINE HCL 10 MG/ML IV SOLN
10.0000 mg | Freq: Once | INTRAVENOUS | Status: AC
  Administered 2023-11-10: 10 mg via INTRAVENOUS
  Filled 2023-11-10: qty 1

## 2023-11-10 MED ORDER — FAMOTIDINE IN NACL 20-0.9 MG/50ML-% IV SOLN
20.0000 mg | Freq: Once | INTRAVENOUS | Status: AC
  Administered 2023-11-10: 20 mg via INTRAVENOUS

## 2023-11-10 MED ORDER — METHYLPREDNISOLONE SODIUM SUCC 125 MG IJ SOLR
125.0000 mg | Freq: Once | INTRAMUSCULAR | Status: AC | PRN
  Administered 2023-11-10: 125 mg via INTRAVENOUS

## 2023-11-10 MED ORDER — ACETAMINOPHEN 325 MG PO TABS
650.0000 mg | ORAL_TABLET | Freq: Once | ORAL | Status: AC
  Administered 2023-11-10: 650 mg via ORAL
  Filled 2023-11-10: qty 2

## 2023-11-10 MED ORDER — SODIUM CHLORIDE 0.9 % IV SOLN
INTRAVENOUS | Status: DC
Start: 2023-11-10 — End: 2023-11-10

## 2023-11-10 MED ORDER — SODIUM CHLORIDE 0.9 % IV SOLN
500.0000 mg | Freq: Once | INTRAVENOUS | Status: AC
  Administered 2023-11-10: 500 mg via INTRAVENOUS
  Filled 2023-11-10: qty 20

## 2023-11-10 NOTE — Progress Notes (Signed)
 Notified by RN that patient was experiencing pain and mild swelling in her hands, as well as some aching pain in her ankles.  During my bedside assessment of patient, she noted that her throat also felt "scratchy, but it might also be swelling."  She had also had some postnasal drip and coughing, but out of caution for any possible reaction, she was given IV Pepcid  and IV Solu-Medrol  and monitored for additional 30 minutes.  No progression of symptoms, therefore discharged home with strict instructions to seek immediate medical attention if she had any new or worsening symptoms.  Patient's primary hematologist out of office, but we will discuss with her at follow-up regarding changing to alternative form of iron  or adding additional premedications.  Sheril Dines PA-C 11/10/2023 8:28 PM

## 2023-11-10 NOTE — Progress Notes (Signed)
 Patient presents today for iron  infusion.  Patient is in satisfactory condition with no new complaints voiced.  Vital signs are stable.  IV placed in L forearm.  IV flushed well with good blood return noted.  We will proceed with infusion per provider orders.    1542:  RN was discharging patient when she c/o R hand pain/stiffness and throat "feeling funny".    1543:  Sheril Dines, PA-C made aware.  1545:  Sheril Dines, PA-C at bedside. Patient also notes that the stiffness/pain has moved to her ankles.   1551:  Pepcid  and Solumedrol verbal orders by Sheril Dines, PA-C.  We will also monitor patient for an additional 30 minutes then re-evaluate symptoms.   1605:  Vitals remain stable and symptoms are unchanged.    1630:  Patient re-evaluated by Sheril Dines, PA-C.  Symptoms unchanged.  We will discharge patient home with knowledge to return to the ED with any SOB, swelling, itching, etc. Patient left via wheelchair in stable condition.

## 2023-11-10 NOTE — Progress Notes (Signed)
 Hypersensitivity Reaction note  Date of event: 11/10/23 Time of event: 1542 Generic name of drug involved: Venofer  Name of provider notified of the hypersensitivity reaction: Sheril Dines, PA-C Was agent that likely caused hypersensitivity reaction added to Allergies List within EMR? yes Chain of events including reaction signs/symptoms, treatment administered, and outcome (e.g., drug resumed; drug discontinued; sent to Emergency Department; etc.)   1542:  RN was discharging patient when she c/o R hand pain/stiffness and throat "feeling funny".    1543:  Sheril Dines, PA-C made aware.  1545:  Sheril Dines, PA-C at bedside. Patient also notes that the stiffness/pain has moved to her ankles.   1551:  Pepcid  and Solumedrol verbal orders by Sheril Dines, PA-C.  We will also monitor patient for an additional 30 minutes then re-evaluate symptoms.   1605:  Vitals remain stable and symptoms are unchanged.    1630:  Patient re-evaluated by Sheril Dines, PA-C.  Symptoms unchanged.  We will discharge patient home with knowledge to return to the ED with any SOB, swelling, itching, etc. Patient left via wheelchair in stable condition.    Karoline Pacific, RN 11/10/2023 4:50 PM

## 2023-11-10 NOTE — Patient Instructions (Signed)
 [facility] [place] - A DEPT OF [facility]  Discharge Instructions: Thank you for choosing [facility] to provide your oncology and hematology care.  If you have a lab appointment with the Cancer Center - please note that after April 8th, 2024, all labs will be drawn in the cancer center.  You do not have to check in or register with the main entrance as you have in the past but will complete your check-in in the cancer center.  Wear comfortable clothing and clothing appropriate for easy access to any Portacath or PICC line.   We strive to give you quality time with your provider. You may need to reschedule your appointment if you arrive late (15 or more minutes).  Arriving late affects you and other patients whose appointments are after yours.  Also, if you miss three or more appointments without notifying the office, you may be dismissed from the clinic at the provider's discretion.      For prescription refill requests, have your pharmacy contact our office and allow 72 hours for refills to be completed.    Today you received the following:  Venofer.    Iron Sucrose Injection What is this medication? IRON SUCROSE (EYE ern SOO krose) treats low levels of iron (iron deficiency anemia) in people with kidney disease. Iron is a mineral that plays an important role in making red blood cells, which carry oxygen from your lungs to the rest of your body. This medicine may be used for other purposes; ask your health care provider or pharmacist if you have questions. COMMON BRAND NAME(S): Venofer What should I tell my care team before I take this medication? They need to know if you have any of these conditions: Anemia not caused by low iron levels Heart disease High levels of iron in the blood Kidney disease Liver disease An unusual or allergic reaction to iron, other medications, foods, dyes, or preservatives Pregnant or trying to get  pregnant Breastfeeding How should I use this medication? This medication is infused into a vein. It is given by your care team in a hospital or clinic setting. Talk to your care team about the use of this medication in children. While it may be prescribed for children as young as 2 years for selected conditions, precautions do apply. Overdosage: If you think you have taken too much of this medicine contact a poison control center or emergency room at once. NOTE: This medicine is only for you. Do not share this medicine with others. What if I miss a dose? Keep appointments for follow-up doses. It is important not to miss your dose. Call your care team if you are unable to keep an appointment. What may interact with this medication? Do not take this medication with any of the following: Deferoxamine Dimercaprol Other iron products This medication may also interact with the following: Chloramphenicol Deferasirox This list may not describe all possible interactions. Give your health care provider a list of all the medicines, herbs, non-prescription drugs, or dietary supplements you use. Also tell them if you smoke, drink alcohol, or use illegal drugs. Some items may interact with your medicine. What should I watch for while using this medication? Visit your care team for regular checks on your progress. Tell your care team if your symptoms do not start to get better or if they get worse. You may need blood work done while you are taking this medication. You may need to eat  more foods that contain iron. Talk to your care team. Foods that contain iron include whole grains or cereals, dried fruits, beans, peas, leafy green vegetables, and organ meats (liver, kidney). What side effects may I notice from receiving this medication? Side effects that you should report to your care team as soon as possible: Allergic reactions--skin rash, itching, hives, swelling of the face, lips, tongue, or throat Low  blood pressure--dizziness, feeling faint or lightheaded, blurry vision Shortness of breath Side effects that usually do not require medical attention (report to your care team if they continue or are bothersome): Flushing Headache Joint pain Muscle pain Nausea Pain, redness, or irritation at injection site This list may not describe all possible side effects. Call your doctor for medical advice about side effects. You may report side effects to FDA at 1-800-FDA-1088. Where should I keep my medication? This medication is given in a hospital or clinic. It will not be stored at home. NOTE: This sheet is a summary. It may not cover all possible information. If you have questions about this medicine, talk to your doctor, pharmacist, or health care provider.  2024 Elsevier/Gold Standard (2023-02-26 00:00:00)   To help prevent nausea and vomiting after your treatment, we encourage you to take your nausea medication as directed.  BELOW ARE SYMPTOMS THAT SHOULD BE REPORTED IMMEDIATELY: *FEVER GREATER THAN 100.4 F (38 C) OR HIGHER *CHILLS OR SWEATING *NAUSEA AND VOMITING THAT IS NOT CONTROLLED WITH YOUR NAUSEA MEDICATION *UNUSUAL SHORTNESS OF BREATH *UNUSUAL BRUISING OR BLEEDING *URINARY PROBLEMS (pain or burning when urinating, or frequent urination) *BOWEL PROBLEMS (unusual diarrhea, constipation, pain near the anus) TENDERNESS IN MOUTH AND THROAT WITH OR WITHOUT PRESENCE OF ULCERS (sore throat, sores in mouth, or a toothache) UNUSUAL RASH, SWELLING OR PAIN  UNUSUAL VAGINAL DISCHARGE OR ITCHING   Items with * indicate a potential emergency and should be followed up as soon as possible or go to the Emergency Department if any problems should occur.  Please show the CHEMOTHERAPY ALERT CARD or IMMUNOTHERAPY ALERT CARD at check-in to the Emergency Department and triage nurse.  Should you have questions after your visit or need to cancel or reschedule your appointment, please contact [facility] CANCER  CTR [place] - A DEPT OF [facility] [phone]  and follow the prompts.  Office hours are 8:00 a.m. to 4:30 p.m. Monday - Friday. Please note that voicemails left after 4:00 p.m. may not be returned until the following business day.  We are closed weekends and major holidays. You have access to a nurse at all times for urgent questions. Please call the main number to the clinic [phone] and follow the prompts.  For any non-urgent questions, you may also contact your provider using MyChart. We now offer e-Visits for anyone 24 and older to request care online for non-urgent symptoms. For details visit [email or web address].   Also download the MyChart app! Go to the app store, search "MyChart", open the app, select [place], and log in with your MyChart username and password.

## 2023-11-12 ENCOUNTER — Other Ambulatory Visit: Payer: Self-pay | Admitting: Oncology

## 2023-11-12 ENCOUNTER — Inpatient Hospital Stay

## 2023-11-17 ENCOUNTER — Telehealth: Payer: Self-pay | Admitting: *Deleted

## 2023-11-17 NOTE — Telephone Encounter (Signed)
 Followed up with patient per Dr. Curtis Dow request to offer other treatment options aside from Venofer , seeing that she did not tolerate her first dose.  States that she would like to give it some thought and will call office back to reschedule.

## 2023-11-18 ENCOUNTER — Inpatient Hospital Stay

## 2023-12-09 ENCOUNTER — Other Ambulatory Visit: Payer: Self-pay

## 2023-12-09 DIAGNOSIS — D500 Iron deficiency anemia secondary to blood loss (chronic): Secondary | ICD-10-CM

## 2023-12-09 DIAGNOSIS — D649 Anemia, unspecified: Secondary | ICD-10-CM

## 2023-12-10 ENCOUNTER — Inpatient Hospital Stay

## 2023-12-10 DIAGNOSIS — D649 Anemia, unspecified: Secondary | ICD-10-CM

## 2023-12-10 DIAGNOSIS — D500 Iron deficiency anemia secondary to blood loss (chronic): Secondary | ICD-10-CM

## 2023-12-10 LAB — COMPREHENSIVE METABOLIC PANEL WITH GFR
ALT: 16 U/L (ref 0–44)
AST: 19 U/L (ref 15–41)
Albumin: 3.2 g/dL — ABNORMAL LOW (ref 3.5–5.0)
Alkaline Phosphatase: 55 U/L (ref 38–126)
Anion gap: 6 (ref 5–15)
BUN: 22 mg/dL — ABNORMAL HIGH (ref 6–20)
CO2: 29 mmol/L (ref 22–32)
Calcium: 8.8 mg/dL — ABNORMAL LOW (ref 8.9–10.3)
Chloride: 97 mmol/L — ABNORMAL LOW (ref 98–111)
Creatinine, Ser: 1.06 mg/dL — ABNORMAL HIGH (ref 0.44–1.00)
GFR, Estimated: >60 mL/min (ref >60)
Glucose, Bld: 169 mg/dL — ABNORMAL HIGH (ref 70–99)
Potassium: 3.4 mmol/L — ABNORMAL LOW (ref 3.5–5.1)
Sodium: 132 mmol/L — ABNORMAL LOW (ref 135–145)
Total Bilirubin: 0.6 mg/dL (ref 0.0–1.2)
Total Protein: 7.4 g/dL (ref 6.5–8.1)

## 2023-12-10 LAB — CBC WITH DIFFERENTIAL/PLATELET
Abs Immature Granulocytes: 0.08 10*3/uL — ABNORMAL HIGH (ref 0.00–0.07)
Basophils Absolute: 0 10*3/uL (ref 0.0–0.1)
Basophils Relative: 0 %
Eosinophils Absolute: 0 10*3/uL (ref 0.0–0.5)
Eosinophils Relative: 0 %
HCT: 43.1 % (ref 36.0–46.0)
Hemoglobin: 12.8 g/dL (ref 12.0–15.0)
Immature Granulocytes: 1 %
Lymphocytes Relative: 13 %
Lymphs Abs: 2.1 10*3/uL (ref 0.7–4.0)
MCH: 27 pg (ref 26.0–34.0)
MCHC: 29.7 g/dL — ABNORMAL LOW (ref 30.0–36.0)
MCV: 90.9 fL (ref 80.0–100.0)
Monocytes Absolute: 0.4 10*3/uL (ref 0.1–1.0)
Monocytes Relative: 2 %
Neutro Abs: 13.7 10*3/uL — ABNORMAL HIGH (ref 1.7–7.7)
Neutrophils Relative %: 84 %
Platelets: 266 10*3/uL (ref 150–400)
RBC: 4.74 MIL/uL (ref 3.87–5.11)
RDW: 22.5 % — ABNORMAL HIGH (ref 11.5–15.5)
WBC: 16.3 10*3/uL — ABNORMAL HIGH (ref 4.0–10.5)
nRBC: 0.1 % (ref 0.0–0.2)

## 2023-12-10 LAB — IRON AND TIBC
Iron: 18 ug/dL — ABNORMAL LOW (ref 28–170)
Saturation Ratios: 5 % — ABNORMAL LOW (ref 10.4–31.8)
TIBC: 332 ug/dL (ref 250–450)
UIBC: 314 ug/dL

## 2023-12-10 LAB — FERRITIN: Ferritin: 78 ng/mL (ref 11–307)

## 2023-12-10 LAB — VITAMIN B12: Vitamin B-12: 949 pg/mL — ABNORMAL HIGH (ref 180–914)

## 2023-12-10 LAB — FOLATE: Folate: 6.2 ng/mL (ref >5.9)

## 2023-12-11 ENCOUNTER — Emergency Department (HOSPITAL_COMMUNITY)

## 2023-12-11 ENCOUNTER — Other Ambulatory Visit: Payer: Self-pay

## 2023-12-11 ENCOUNTER — Encounter (HOSPITAL_COMMUNITY): Payer: Self-pay | Admitting: Internal Medicine

## 2023-12-11 ENCOUNTER — Inpatient Hospital Stay (HOSPITAL_COMMUNITY)
Admission: EM | Admit: 2023-12-11 | Discharge: 2023-12-15 | DRG: 193 | Disposition: A | Discharge status: Home / Self Care | Attending: Family Medicine | Admitting: Family Medicine

## 2023-12-11 DIAGNOSIS — F32A Depression, unspecified: Secondary | ICD-10-CM | POA: Diagnosis present

## 2023-12-11 DIAGNOSIS — B1920 Unspecified viral hepatitis C without hepatic coma: Secondary | ICD-10-CM | POA: Diagnosis present

## 2023-12-11 DIAGNOSIS — Z8249 Family history of ischemic heart disease and other diseases of the circulatory system: Secondary | ICD-10-CM | POA: Diagnosis not present

## 2023-12-11 DIAGNOSIS — J441 Chronic obstructive pulmonary disease with (acute) exacerbation: Principal | ICD-10-CM | POA: Diagnosis present

## 2023-12-11 DIAGNOSIS — I5032 Chronic diastolic (congestive) heart failure: Secondary | ICD-10-CM | POA: Diagnosis present

## 2023-12-11 DIAGNOSIS — Z720 Tobacco use: Secondary | ICD-10-CM | POA: Diagnosis not present

## 2023-12-11 DIAGNOSIS — F17290 Nicotine dependence, other tobacco product, uncomplicated: Secondary | ICD-10-CM | POA: Diagnosis present

## 2023-12-11 DIAGNOSIS — I110 Hypertensive heart disease with heart failure: Secondary | ICD-10-CM | POA: Diagnosis present

## 2023-12-11 DIAGNOSIS — E871 Hypo-osmolality and hyponatremia: Secondary | ICD-10-CM | POA: Diagnosis present

## 2023-12-11 DIAGNOSIS — K219 Gastro-esophageal reflux disease without esophagitis: Secondary | ICD-10-CM | POA: Diagnosis present

## 2023-12-11 DIAGNOSIS — Z79899 Other long term (current) drug therapy: Secondary | ICD-10-CM | POA: Diagnosis not present

## 2023-12-11 DIAGNOSIS — Z8541 Personal history of malignant neoplasm of cervix uteri: Secondary | ICD-10-CM

## 2023-12-11 DIAGNOSIS — E876 Hypokalemia: Secondary | ICD-10-CM | POA: Diagnosis present

## 2023-12-11 DIAGNOSIS — F411 Generalized anxiety disorder: Secondary | ICD-10-CM | POA: Diagnosis present

## 2023-12-11 DIAGNOSIS — Z7984 Long term (current) use of oral hypoglycemic drugs: Secondary | ICD-10-CM

## 2023-12-11 DIAGNOSIS — F17210 Nicotine dependence, cigarettes, uncomplicated: Secondary | ICD-10-CM | POA: Diagnosis present

## 2023-12-11 DIAGNOSIS — J189 Pneumonia, unspecified organism: Principal | ICD-10-CM

## 2023-12-11 DIAGNOSIS — I340 Nonrheumatic mitral (valve) insufficiency: Secondary | ICD-10-CM | POA: Diagnosis present

## 2023-12-11 DIAGNOSIS — R739 Hyperglycemia, unspecified: Secondary | ICD-10-CM | POA: Diagnosis not present

## 2023-12-11 DIAGNOSIS — T380X5A Adverse effect of glucocorticoids and synthetic analogues, initial encounter: Secondary | ICD-10-CM | POA: Diagnosis not present

## 2023-12-11 DIAGNOSIS — J440 Chronic obstructive pulmonary disease with acute lower respiratory infection: Secondary | ICD-10-CM | POA: Diagnosis present

## 2023-12-11 DIAGNOSIS — I509 Heart failure, unspecified: Secondary | ICD-10-CM

## 2023-12-11 DIAGNOSIS — Z79631 Long term (current) use of antimetabolite agent: Secondary | ICD-10-CM | POA: Diagnosis not present

## 2023-12-11 DIAGNOSIS — J9601 Acute respiratory failure with hypoxia: Secondary | ICD-10-CM | POA: Diagnosis present

## 2023-12-11 LAB — COMPREHENSIVE METABOLIC PANEL WITH GFR
ALT: 15 U/L (ref 0–44)
AST: 17 U/L (ref 15–41)
Albumin: 3.3 g/dL — ABNORMAL LOW (ref 3.5–5.0)
Alkaline Phosphatase: 62 U/L (ref 38–126)
Anion gap: 11 (ref 5–15)
BUN: 16 mg/dL (ref 6–20)
CO2: 27 mmol/L (ref 22–32)
Calcium: 9 mg/dL (ref 8.9–10.3)
Chloride: 96 mmol/L — ABNORMAL LOW (ref 98–111)
Creatinine, Ser: 0.8 mg/dL (ref 0.44–1.00)
GFR, Estimated: >60 mL/min (ref >60)
Glucose, Bld: 120 mg/dL — ABNORMAL HIGH (ref 70–99)
Potassium: 3.4 mmol/L — ABNORMAL LOW (ref 3.5–5.1)
Sodium: 134 mmol/L — ABNORMAL LOW (ref 135–145)
Total Bilirubin: 0.4 mg/dL (ref 0.0–1.2)
Total Protein: 7.9 g/dL (ref 6.5–8.1)

## 2023-12-11 LAB — CBC WITH DIFFERENTIAL/PLATELET
Abs Immature Granulocytes: 0.11 10*3/uL — ABNORMAL HIGH (ref 0.00–0.07)
Basophils Absolute: 0 10*3/uL (ref 0.0–0.1)
Basophils Relative: 0 %
Eosinophils Absolute: 0.1 10*3/uL (ref 0.0–0.5)
Eosinophils Relative: 1 %
HCT: 42 % (ref 36.0–46.0)
Hemoglobin: 13 g/dL (ref 12.0–15.0)
Immature Granulocytes: 1 %
Lymphocytes Relative: 14 %
Lymphs Abs: 1.7 10*3/uL (ref 0.7–4.0)
MCH: 28 pg (ref 26.0–34.0)
MCHC: 31 g/dL (ref 30.0–36.0)
MCV: 90.5 fL (ref 80.0–100.0)
Monocytes Absolute: 0.4 10*3/uL (ref 0.1–1.0)
Monocytes Relative: 4 %
Neutro Abs: 10.1 10*3/uL — ABNORMAL HIGH (ref 1.7–7.7)
Neutrophils Relative %: 80 %
Platelets: 278 10*3/uL (ref 150–400)
RBC: 4.64 MIL/uL (ref 3.87–5.11)
RDW: 22.5 % — ABNORMAL HIGH (ref 11.5–15.5)
WBC: 12.5 10*3/uL — ABNORMAL HIGH (ref 4.0–10.5)
nRBC: 0.2 % (ref 0.0–0.2)

## 2023-12-11 LAB — RAPID URINE DRUG SCREEN, HOSP PERFORMED
Amphetamines: NOT DETECTED
Barbiturates: NOT DETECTED
Benzodiazepines: NOT DETECTED
Cocaine: NOT DETECTED
Opiates: NOT DETECTED
Tetrahydrocannabinol: NOT DETECTED

## 2023-12-11 LAB — GLUCOSE, CAPILLARY: Glucose-Capillary: 338 mg/dL — ABNORMAL HIGH (ref 70–99)

## 2023-12-11 LAB — BRAIN NATRIURETIC PEPTIDE: B Natriuretic Peptide: 360 pg/mL — ABNORMAL HIGH (ref 0.0–100.0)

## 2023-12-11 LAB — RESP PANEL BY RT-PCR (RSV, FLU A&B, COVID)  RVPGX2
Influenza A by PCR: NEGATIVE
Influenza B by PCR: NEGATIVE
Resp Syncytial Virus by PCR: NEGATIVE
SARS Coronavirus 2 by RT PCR: NEGATIVE

## 2023-12-11 LAB — TROPONIN I (HIGH SENSITIVITY): Troponin I (High Sensitivity): 5 ng/L (ref <18)

## 2023-12-11 MED ORDER — MIRTAZAPINE 15 MG PO TABS
15.0000 mg | ORAL_TABLET | Freq: Every day | ORAL | Status: DC
Start: 2023-12-11 — End: 2023-12-15
  Administered 2023-12-11 – 2023-12-14 (×4): 15 mg via ORAL
  Filled 2023-12-11 (×4): qty 1

## 2023-12-11 MED ORDER — SODIUM CHLORIDE 0.9 % IV SOLN
2.0000 g | Freq: Once | INTRAVENOUS | Status: AC
  Administered 2023-12-11: 2 g via INTRAVENOUS
  Filled 2023-12-11: qty 20

## 2023-12-11 MED ORDER — METHYLPREDNISOLONE SODIUM SUCC 125 MG IJ SOLR
125.0000 mg | Freq: Once | INTRAMUSCULAR | Status: AC
Start: 2023-12-11 — End: 2023-12-11
  Administered 2023-12-11: 125 mg via INTRAVENOUS
  Filled 2023-12-11: qty 2

## 2023-12-11 MED ORDER — IPRATROPIUM-ALBUTEROL 0.5-2.5 (3) MG/3ML IN SOLN
9.0000 mL | Freq: Once | RESPIRATORY_TRACT | Status: AC
  Administered 2023-12-11: 9 mL via RESPIRATORY_TRACT
  Filled 2023-12-11: qty 3

## 2023-12-11 MED ORDER — LISINOPRIL 10 MG PO TABS
10.0000 mg | ORAL_TABLET | Freq: Every day | ORAL | Status: DC
  Administered 2023-12-12 – 2023-12-15 (×4): 10 mg via ORAL
  Filled 2023-12-11 (×4): qty 1

## 2023-12-11 MED ORDER — ACETAMINOPHEN 650 MG RE SUPP: 650.0000 mg | Freq: Four times a day (QID) | RECTAL | Status: DC | PRN

## 2023-12-11 MED ORDER — EMPAGLIFLOZIN 10 MG PO TABS
10.0000 mg | ORAL_TABLET | Freq: Every day | ORAL | Status: DC
  Administered 2023-12-12 – 2023-12-15 (×4): 10 mg via ORAL
  Filled 2023-12-11 (×5): qty 1

## 2023-12-11 MED ORDER — VITAMIN B-12 1000 MCG PO TABS
1000.0000 ug | ORAL_TABLET | Freq: Every day | ORAL | Status: DC
  Administered 2023-12-12 – 2023-12-15 (×4): 1000 ug via ORAL
  Filled 2023-12-11 (×4): qty 1

## 2023-12-11 MED ORDER — UMECLIDINIUM BROMIDE 62.5 MCG/ACT IN AEPB
1.0000 | INHALATION_SPRAY | Freq: Every day | RESPIRATORY_TRACT | Status: DC
Start: 2023-12-12 — End: 2023-12-15
  Administered 2023-12-12 – 2023-12-14 (×3): 1 via RESPIRATORY_TRACT
  Filled 2023-12-11: qty 7

## 2023-12-11 MED ORDER — SODIUM CHLORIDE 0.9 % IV SOLN
2.0000 g | INTRAVENOUS | Status: DC
  Administered 2023-12-12 – 2023-12-14 (×3): 2 g via INTRAVENOUS
  Filled 2023-12-11 (×3): qty 20

## 2023-12-11 MED ORDER — BUPRENORPHINE HCL-NALOXONE HCL 2-0.5 MG SL SUBL
2.0000 | SUBLINGUAL_TABLET | Freq: Three times a day (TID) | SUBLINGUAL | Status: DC
  Administered 2023-12-11 – 2023-12-15 (×11): 2 via SUBLINGUAL
  Filled 2023-12-11 (×11): qty 2

## 2023-12-11 MED ORDER — BUPRENORPHINE HCL-NALOXONE HCL 2-0.5 MG SL SUBL
2.0000 | SUBLINGUAL_TABLET | Freq: Every day | SUBLINGUAL | Status: DC
Start: 2023-12-12 — End: 2023-12-11

## 2023-12-11 MED ORDER — SODIUM CHLORIDE 0.9 % IV SOLN
500.0000 mg | INTRAVENOUS | Status: DC
  Administered 2023-12-11 – 2023-12-14 (×4): 500 mg via INTRAVENOUS
  Filled 2023-12-11 (×4): qty 5

## 2023-12-11 MED ORDER — ALBUTEROL SULFATE (2.5 MG/3ML) 0.083% IN NEBU
10.0000 mg/h | INHALATION_SOLUTION | Freq: Once | RESPIRATORY_TRACT | Status: AC
  Administered 2023-12-11: 10 mg/h via RESPIRATORY_TRACT
  Filled 2023-12-11: qty 12

## 2023-12-11 MED ORDER — GABAPENTIN 300 MG PO CAPS
600.0000 mg | ORAL_CAPSULE | Freq: Two times a day (BID) | ORAL | Status: DC
  Administered 2023-12-12 – 2023-12-15 (×7): 600 mg via ORAL
  Filled 2023-12-11 (×7): qty 2

## 2023-12-11 MED ORDER — BUSPIRONE HCL 5 MG PO TABS
7.5000 mg | ORAL_TABLET | Freq: Two times a day (BID) | ORAL | Status: DC
  Administered 2023-12-11 – 2023-12-14 (×7): 7.5 mg via ORAL
  Filled 2023-12-11 (×7): qty 2

## 2023-12-11 MED ORDER — NICOTINE 21 MG/24HR TD PT24
21.0000 mg | MEDICATED_PATCH | Freq: Every day | TRANSDERMAL | Status: DC
  Administered 2023-12-12 – 2023-12-14 (×2): 21 mg via TRANSDERMAL
  Filled 2023-12-11 (×6): qty 1

## 2023-12-11 MED ORDER — INSULIN ASPART 100 UNIT/ML IJ SOLN
0.0000 [IU] | Freq: Three times a day (TID) | INTRAMUSCULAR | Status: DC
  Administered 2023-12-12: 7 [IU] via SUBCUTANEOUS
  Administered 2023-12-12 (×2): 2 [IU] via SUBCUTANEOUS
  Administered 2023-12-13: 1 [IU] via SUBCUTANEOUS
  Administered 2023-12-13: 2 [IU] via SUBCUTANEOUS
  Administered 2023-12-13: 7 [IU] via SUBCUTANEOUS
  Administered 2023-12-14: 3 [IU] via SUBCUTANEOUS
  Administered 2023-12-15: 1 [IU] via SUBCUTANEOUS
  Administered 2023-12-15: 2 [IU] via SUBCUTANEOUS

## 2023-12-11 MED ORDER — PANTOPRAZOLE SODIUM 40 MG PO TBEC
40.0000 mg | DELAYED_RELEASE_TABLET | Freq: Every day | ORAL | Status: DC
  Administered 2023-12-12 – 2023-12-15 (×4): 40 mg via ORAL
  Filled 2023-12-11 (×6): qty 1

## 2023-12-11 MED ORDER — ACETAMINOPHEN 325 MG PO TABS
650.0000 mg | ORAL_TABLET | Freq: Four times a day (QID) | ORAL | Status: DC | PRN
  Filled 2023-12-11 (×2): qty 2

## 2023-12-11 MED ORDER — TIOTROPIUM BROMIDE MONOHYDRATE 18 MCG IN CAPS: 18.0000 ug | ORAL_CAPSULE | Freq: Every day | RESPIRATORY_TRACT | Status: DC

## 2023-12-11 MED ORDER — LISINOPRIL-HYDROCHLOROTHIAZIDE 10-12.5 MG PO TABS: 1.0000 | ORAL_TABLET | Freq: Every day | ORAL | Status: DC

## 2023-12-11 MED ORDER — BUPROPION HCL ER (XL) 300 MG PO TB24
300.0000 mg | ORAL_TABLET | Freq: Every day | ORAL | Status: DC
  Administered 2023-12-12 – 2023-12-15 (×4): 300 mg via ORAL
  Filled 2023-12-11 (×4): qty 1

## 2023-12-11 MED ORDER — HEPARIN SODIUM (PORCINE) 5000 UNIT/ML IJ SOLN
5000.0000 [IU] | Freq: Three times a day (TID) | INTRAMUSCULAR | Status: DC
  Administered 2023-12-11 – 2023-12-14 (×9): 5000 [IU] via SUBCUTANEOUS
  Filled 2023-12-11 (×10): qty 1

## 2023-12-11 MED ORDER — FOLIC ACID 1 MG PO TABS
1.0000 mg | ORAL_TABLET | Freq: Every day | ORAL | Status: DC
  Administered 2023-12-12 – 2023-12-15 (×4): 1 mg via ORAL
  Filled 2023-12-11 (×4): qty 1

## 2023-12-11 MED ORDER — GABAPENTIN 300 MG PO CAPS
600.0000 mg | ORAL_CAPSULE | Freq: Once | ORAL | Status: AC
  Administered 2023-12-11: 600 mg via ORAL
  Filled 2023-12-11: qty 2

## 2023-12-11 MED ORDER — METHYLPREDNISOLONE SODIUM SUCC 125 MG IJ SOLR
125.0000 mg | Freq: Every day | INTRAMUSCULAR | Status: DC
  Administered 2023-12-12 – 2023-12-13 (×2): 125 mg via INTRAVENOUS
  Filled 2023-12-11 (×3): qty 2

## 2023-12-11 MED ORDER — INSULIN ASPART 100 UNIT/ML IJ SOLN
0.0000 [IU] | Freq: Every day | INTRAMUSCULAR | Status: DC
  Administered 2023-12-11: 4 [IU] via SUBCUTANEOUS
  Administered 2023-12-12: 3 [IU] via SUBCUTANEOUS
  Administered 2023-12-14: 5 [IU] via SUBCUTANEOUS
  Administered 2023-12-15: 1 [IU] via SUBCUTANEOUS

## 2023-12-11 MED ORDER — HYDROCHLOROTHIAZIDE 12.5 MG PO TABS
12.5000 mg | ORAL_TABLET | Freq: Every day | ORAL | Status: DC
  Administered 2023-12-12: 12.5 mg via ORAL
  Filled 2023-12-11: qty 1

## 2023-12-11 MED ORDER — METOPROLOL TARTRATE 25 MG PO TABS
25.0000 mg | ORAL_TABLET | Freq: Two times a day (BID) | ORAL | Status: DC
  Administered 2023-12-11 – 2023-12-12 (×3): 25 mg via ORAL
  Filled 2023-12-11 (×4): qty 1

## 2023-12-11 MED ORDER — IPRATROPIUM-ALBUTEROL 0.5-2.5 (3) MG/3ML IN SOLN
3.0000 mL | Freq: Four times a day (QID) | RESPIRATORY_TRACT | Status: DC
  Administered 2023-12-11 – 2023-12-14 (×9): 3 mL via RESPIRATORY_TRACT
  Filled 2023-12-11 (×9): qty 3

## 2023-12-11 MED ORDER — POTASSIUM CHLORIDE CRYS ER 20 MEQ PO TBCR
40.0000 meq | EXTENDED_RELEASE_TABLET | Freq: Four times a day (QID) | ORAL | Status: AC
Start: 2023-12-11 — End: 2023-12-11
  Administered 2023-12-11 (×2): 40 meq via ORAL
  Filled 2023-12-11 (×2): qty 2

## 2023-12-11 MED ORDER — ALBUTEROL SULFATE (2.5 MG/3ML) 0.083% IN NEBU: 2.5000 mg | INHALATION_SOLUTION | RESPIRATORY_TRACT | Status: DC | PRN

## 2023-12-11 NOTE — ED Provider Notes (Signed)
 [place] EMERGENCY DEPARTMENT AT [facility] Provider Note  CSN: [id number] Arrival date & time: 12/11/23 1342  Chief Complaint(s) Shortness of Breath  HPI [name] is a 53 y.o. female with PMH polysubstance abuse, hep C, anxiety, arthritis, depression, CHF, possible COPD who presents emergency room for evaluation of shortness of breath.  States that over the last 4 to 5 days she has become progressively more short of breath.  States that she has been using her home nebulizer treatments and Spiriva without improvement.  Last home nebulizer was 11 AM this morning.  Does not wear oxygen at home but was found 76% on room air.  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   Past Medical History Past Medical History:  Diagnosis Date   Anxiety    Arthritis    Cancer (HCC)    Cervical cancer (HCC)    Depressed    Diastolic heart failure (HCC)    Grade 2   Drug addiction (HCC) Hep C   Foot abscess, left    Gastroesophageal reflux disease    Gastroparesis    Hepatitis C    pt denies any treatment   Hypertension    Mitral regurgitation    Moderate   Polysubstance dependence including opioid type drug, episodic abuse (HCC)    Psoriasis    Renal disorder    kidney stones   Patient Active Problem List   Diagnosis Date Noted   ERRONEOUS ENCOUNTER--DISREGARD 09/11/2023   Dysphagia 11/27/2022   History of hepatitis C 11/27/2022   Acute HFpEF 11/18/2022   IDA (iron  deficiency anemia) 11/18/2022   CHF exacerbation (HCC) 11/18/2022   Nonrheumatic mitral valve regurgitation 11/11/2022   Acute hypoxic respiratory failure (HCC) 11/06/2022   Elevated brain natriuretic peptide (BNP) level 11/06/2022   CAP (community acquired pneumonia) 11/06/2022   Acute bronchitis 11/06/2022   Wound, open, foot with complication, left, sequela 10/10/2017   Cellulitis and abscess of foot 10/02/2017   Chronic pelvic pain in female 03/23/2015   Cancer (HCC)     Hematemesis 05/27/2013   Urinary tract infection, site not specified 09/10/2012   Polysubstance dependence including opioid type drug, episodic abuse (HCC) 09/10/2012   Gram-negative bacteremia 09/07/2012   Acute renal failure (HCC) 09/07/2012   Microcytic anemia 09/06/2012   Hypokalemia 09/06/2012   Blood in stool 09/06/2012   Tobacco use 08/05/2012   H/O intravenous drug use in remission 08/05/2012   Hyponatremia 08/05/2012   Psoas abscess, left (HCC) 08/05/2012   GASTROPARESIS 09/23/2008   NECK PAIN 11/12/2007   HYPERLIPIDEMIA 07/10/2007   Anxiety state 07/10/2007   Depression 07/10/2007   Essential hypertension 07/10/2007   GERD 07/10/2007   Peptic ulcer 07/10/2007   Disorder of uterus 07/10/2007   PSORIASIS 07/10/2007   Arthropathy 07/10/2007   LOW BACK PAIN 07/10/2007   WEIGHT GAIN, ABNORMAL 07/10/2007   COUGH 07/10/2007   CERVICAL CANCER, HX OF 07/10/2007   MIGRAINES, HX OF 07/10/2007   Home Medication(s) Prior to Admission medications   Medication Sig Start Date End Date Taking? Authorizing Provider  acetaminophen  (TYLENOL ) 325 MG tablet Take 650 mg by mouth every 6 (six) hours as needed for moderate pain.   Yes [provider]  albuterol  (PROVENTIL ) (2.5 MG/3ML) 0.083% nebulizer solution USE ONE VIAL IN NEBULIZER EVERY 6 HOURS AS NEEDED 08/12/23  Yes [provider]  Buprenorphine  HCl-Naloxone  HCl 8-2 MG FILM Place 1 Film under the tongue 3 (three) times daily. 11/04/22  Yes [provider]  buPROPion (WELLBUTRIN XL) 300 MG 24 hr tablet TAKE ONE TABLET BY MOUTH EVERY MORNING for 30   Yes [provider]  busPIRone (BUSPAR) 7.5 MG tablet Take 7.5 mg by mouth 2 (two) times daily. 02/04/23  Yes [provider]  cyanocobalamin  (VITAMIN B12) 1000 MCG tablet Take 1 tablet (1,000 mcg total) by mouth daily. 09/26/23  Yes Kandala, Hyndavi, MD  doxycycline  (VIBRA -TABS) 100 MG tablet 1 tablet Orally twice a day for 10 days 08/12/23  Yes  [provider]  empagliflozin  (JARDIANCE ) 10 MG TABS tablet TAKE ONE TABLET BY MOUTH ONCE DAILY 05/05/23  Yes Mallipeddi, Vishnu P, MD  folic acid  (FOLVITE ) 1 MG tablet Take 1 tablet (1 mg total) by mouth daily. 09/26/23  Yes Kandala, Hyndavi, MD  gabapentin (NEURONTIN) 600 MG tablet Take 600 mg by mouth 2 (two) times daily. 12/03/23  Yes [provider]  lisinopril -hydrochlorothiazide  (ZESTORETIC ) 10-12.5 MG tablet Take 1 tablet by mouth daily. 07/08/23  Yes [provider]  metoprolol  tartrate (LOPRESSOR ) 25 MG tablet Take 25 mg by mouth 2 (two) times daily. 11/17/23  Yes [provider]  mirtazapine  (REMERON ) 15 MG tablet Take 15 mg by mouth at bedtime. 02/25/23  Yes [provider]  nystatin (MYCOSTATIN) 100000 UNIT/ML suspension Take 5 mLs by mouth every 6 (six) hours. 10/08/23  Yes [provider]  pantoprazole  (PROTONIX ) 40 MG tablet Take 1 tablet (40 mg total) by mouth daily before breakfast. 01/26/23  Yes April Knack, Kristen S, PA-C  polyethylene glycol powder (GLYCOLAX /MIRALAX ) 17 GM/SCOOP powder Take by mouth. 12/03/23  Yes [provider]  tiotropium (SPIRIVA) 18 MCG inhalation capsule USE ONE CAPSULE BY INHALING THE CONTENT OF THE CAPSULE USING THE HANDIHALER DEVICE ONCE DAILY. for 30   Yes [provider]  torsemide  (DEMADEX ) 20 MG tablet Take 1 tablet (20 mg total) by mouth daily. 03/07/23  Yes Mallipeddi, Vishnu P, MD  Ipratropium-Albuterol  (COMBIVENT  RESPIMAT) 20-100 MCG/ACT AERS respimat Inhale 1 puff into the lungs every 6 (six) hours as needed for wheezing or shortness of breath. 11/11/22   Mason Sole, Pratik D, DO  methotrexate (RHEUMATREX) 2.5 MG tablet Take 15 mg by mouth once a week. Sunday    [provider]                                                                                                                                    Past Surgical History Past Surgical History:  Procedure Laterality Date    APPENDECTOMY     APPLICATION OF WOUND VAC Left 10/10/2017   Procedure: APPLICATION OF WOUND VAC;  Surgeon: Timothy Ford, MD;  Location: [facility] OR;  Service: Orthopedics;  Laterality: Left;   ESOPHAGOGASTRODUODENOSCOPY  01/04/2008   Dr Gearldean Keepers reflux esophagitis, sm HH, gastritis, BRAVO positive for GERD on BID PPI   I & D EXTREMITY Left 10/03/2017   Procedure: IRRIGATION AND DEBRIDEMENT EXTREMITY;  Surgeon: Timothy Ford, MD;  Location: [facility] OR;  Service: Orthopedics;  Laterality: Left;   IRRIGATION AND DEBRIDEMENT FOOT Left 10/03/2017   LASER ABLATION CONDYLOMA CERVICAL / VULVAR     mandible surgery for fracture     SKIN SPLIT GRAFT Left 10/10/2017   Procedure: SKIN GRAFT SPLIT THICKNESS LEFT FOOT;  Surgeon: Timothy Ford, MD;  Location: [facility] OR;  Service: Orthopedics;  Laterality: Left;   TEE WITHOUT CARDIOVERSION N/A 11/11/2022   Procedure: TRANSESOPHAGEAL ECHOCARDIOGRAM (TEE);  Surgeon: Mallipeddi, Vishnu P, MD;  Location: [facility] ORS;  Service: Cardiovascular;  Laterality: N/A;   TUBAL LIGATION     Family History Family History  Problem Relation Age of Onset   Hyperlipidemia Mother    Diabetes Mother    Hypertension Mother    Hypertension Sister    Hepatitis C Sister    Hypertension Father    Colon cancer Neg Hx    Liver disease Neg Hx     Social History Social History   Tobacco Use   Smoking status: Every Day    Current packs/day: 1.00    Average packs/day: 1 pack/day for 28.0 years (28.0 ttl pk-yrs)    Types: Cigarettes   Smokeless tobacco: Never  Vaping Use   Vaping status: Never Used  Substance Use Topics   Alcohol use: No    Comment: occ ( denies 3/ 21/19)   Drug use: Yes    Types: Cocaine, IV, "Crack" cocaine, Marijuana, Opium , Heroin    Comment: Clean for 5 years (documented May 2024)   Allergies Tramadol  hcl, Venofer  [iron  sucrose], Ketorolac , and Propoxyphene  Review of Systems Review of Systems  Respiratory:  Positive for shortness of breath.      Physical Exam Vital Signs  I have reviewed the triage vital signs BP (!) 152/84   Pulse 69   Resp 13   Ht 5\' 2"  (1.575 m)   Wt 83.1 kg   LMP 01/20/2015   SpO2 97%   BMI 33.51 kg/m   Physical Exam Vitals and nursing note reviewed.  Constitutional:      General: She is not in acute distress.    Appearance: She is well-developed.  HENT:     Head: Normocephalic and atraumatic.  Eyes:     Conjunctiva/sclera: Conjunctivae normal.  Cardiovascular:     Rate and Rhythm: Normal rate and regular rhythm.     Heart sounds: No murmur heard. Pulmonary:     Effort: Pulmonary effort is normal. No respiratory distress.     Breath sounds: Wheezing and rales present.  Abdominal:     Palpations: Abdomen is soft.     Tenderness: There is no abdominal tenderness.  Musculoskeletal:        General: No swelling.     Cervical back: Neck supple.  Skin:    General: Skin is warm and dry.     Capillary Refill: Capillary refill takes less than 2 seconds.  Neurological:     Mental Status: She is alert.  Psychiatric:        Mood and Affect: Mood normal.     ED Results and Treatments Labs (all labs ordered are listed, but only abnormal results are displayed) Labs Reviewed  COMPREHENSIVE METABOLIC PANEL WITH GFR - Abnormal; Notable for the following components:      Result Value   Sodium 134 (*)    Potassium 3.4 (*)    Chloride 96 (*)    Glucose, Bld 120 (*)    Albumin 3.3 (*)    All other components  within normal limits  CBC WITH DIFFERENTIAL/PLATELET - Abnormal; Notable for the following components:   WBC 12.5 (*)    RDW 22.5 (*)    Neutro Abs 10.1 (*)    Abs Immature Granulocytes 0.11 (*)    All other components within normal limits  BRAIN NATRIURETIC PEPTIDE - Abnormal; Notable for the following components:   B Natriuretic Peptide 360.0 (*)    All other components within normal limits  RESP PANEL BY RT-PCR (RSV, FLU A&B, COVID)  RVPGX2  RAPID URINE DRUG SCREEN, HOSP PERFORMED   RAPID URINE DRUG SCREEN, HOSP PERFORMED  TROPONIN I (HIGH SENSITIVITY)  TROPONIN I (HIGH SENSITIVITY)                                                                                                                          Radiology DG Chest Portable 1 View Result Date: 12/11/2023 CLINICAL DATA:  Dyspnea. EXAM: PORTABLE CHEST 1 VIEW COMPARISON:  11/18/2022. FINDINGS: Redemonstration of diffuse increased bilateral interstitial markings. There is superimposed new heterogeneous opacity overlying the right lower lung zone, laterally, without significant volume loss, favoring pneumonia. Follow-up to clearing is recommended. Bilateral lung fields are otherwise clear. Bilateral lateral costophrenic angles are clear. Stable cardio-mediastinal silhouette. No acute osseous abnormalities. The soft tissues are within normal limits. IMPRESSION: *New heterogeneous opacity overlying the right lower lung zone, laterally, favoring pneumonia. Follow-up to clearing is recommended. Electronically Signed   By: Beula Brunswick M.D.   On: 12/11/2023 15:43    Pertinent labs & imaging results that were available during my care of the patient were reviewed by me and considered in my medical decision making (see MDM for details).  Medications Ordered in ED Medications  cefTRIAXone  (ROCEPHIN ) 2 g in sodium chloride  0.9 % 100 mL IVPB (2 g Intravenous New Bag/Given 12/11/23 1619)  azithromycin  (ZITHROMAX ) 500 mg in sodium chloride  0.9 % 250 mL IVPB (has no administration in time range)  gabapentin (NEURONTIN) capsule 600 mg (has no administration in time range)  methylPREDNISolone  sodium succinate (SOLU-MEDROL ) 125 mg/2 mL injection 125 mg (125 mg Intravenous Given 12/11/23 1515)  ipratropium-albuterol  (DUONEB) 0.5-2.5 (3) MG/3ML nebulizer solution 9 mL (9 mLs Nebulization Given 12/11/23 1403)  albuterol  (PROVENTIL ) (2.5 MG/3ML) 0.083% nebulizer solution (10 mg/hr Nebulization Given 12/11/23 1547)  Procedures .Critical Care  Performed by: Karlyn Overman, MD Authorized by: Karlyn Overman, MD   Critical care provider statement:    Critical care time (minutes):  30   Critical care was necessary to treat or prevent imminent or life-threatening deterioration of the following conditions:  Respiratory failure   Critical care was time spent personally by me on the following activities:  Development of treatment plan with patient or surrogate, discussions with consultants, evaluation of patient's response to treatment, examination of patient, ordering and review of laboratory studies, ordering and review of radiographic studies, ordering and performing treatments and interventions, pulse oximetry, re-evaluation of patient's condition and review of old charts   (including critical care time)  Medical Decision Making / ED Course   This patient presents to the ED for concern of shortness of breath, this involves an extensive number of treatment options, and is a complaint that carries with it a high risk of complications and morbidity.  The differential diagnosis includes Pe, PTX, Pulmonary Edema, ARDS, COPD/Asthma, ACS, CHF exacerbation, Arrhythmia, Pericardial Effusion/Tamponade, Anemia, Sepsis, Acidosis/Hypercapnia, Anxiety, Viral URI  MDM: Patient seen emergency room for evaluation of shortness of breath.  Physical exam with wheezing bilaterally and faint rales at the bases.  Patient saturating well on 2 L.  Laboratory evaluation with a leukocytosis of 12.5, potassium 3.4, BNP 360, high-sensitivity troponin is normal, COVID, flu, RSV negative.  Chest x-ray concerning for right lower lobe pneumonia.  Ceftriaxone  azithromycin  ordered.  Patient given 3 DuoNebs and on reevaluation still has persistent wheezing.  A continuous albuterol  treatment was given and patient is having improvement of her work  of breathing but wheezing is persistent.  Will require hospital admission for pneumonia with new oxygen requirement and persistent COPD exacerbation.   Additional history obtained:  -External records from outside source obtained and reviewed including: Chart review including previous notes, labs, imaging, consultation notes   Lab Tests: -I ordered, reviewed, and interpreted labs.   The pertinent results include:   Labs Reviewed  COMPREHENSIVE METABOLIC PANEL WITH GFR - Abnormal; Notable for the following components:      Result Value   Sodium 134 (*)    Potassium 3.4 (*)    Chloride 96 (*)    Glucose, Bld 120 (*)    Albumin 3.3 (*)    All other components within normal limits  CBC WITH DIFFERENTIAL/PLATELET - Abnormal; Notable for the following components:   WBC 12.5 (*)    RDW 22.5 (*)    Neutro Abs 10.1 (*)    Abs Immature Granulocytes 0.11 (*)    All other components within normal limits  BRAIN NATRIURETIC PEPTIDE - Abnormal; Notable for the following components:   B Natriuretic Peptide 360.0 (*)    All other components within normal limits  RESP PANEL BY RT-PCR (RSV, FLU A&B, COVID)  RVPGX2  RAPID URINE DRUG SCREEN, HOSP PERFORMED  RAPID URINE DRUG SCREEN, HOSP PERFORMED  TROPONIN I (HIGH SENSITIVITY)  TROPONIN I (HIGH SENSITIVITY)        Imaging Studies ordered: I ordered imaging studies including chest x-ray I independently visualized and interpreted imaging. I agree with the radiologist interpretation   Medicines ordered and prescription drug management: Meds ordered this encounter  Medications   methylPREDNISolone  sodium succinate (SOLU-MEDROL ) 125 mg/2 mL injection 125 mg   ipratropium-albuterol  (DUONEB) 0.5-2.5 (3) MG/3ML nebulizer solution 9 mL   albuterol  (PROVENTIL ) (2.5 MG/3ML) 0.083% nebulizer solution   cefTRIAXone  (ROCEPHIN ) 2 g in sodium chloride  0.9 % 100 mL IVPB  Antibiotic Indication::   CAP   azithromycin  (ZITHROMAX ) 500 mg in sodium  chloride 0.9 % 250 mL IVPB    Antibiotic Indication::   CAP   gabapentin (NEURONTIN) capsule 600 mg    -I have reviewed the patients home medicines and have made adjustments as needed  Critical interventions Multiple DuoNebs, steroids, antibiotics, supplemental oxygen    Cardiac Monitoring: The patient was maintained on a cardiac monitor.  I personally viewed and interpreted the cardiac monitored which showed an underlying rhythm of: NSR  Social Determinants of Health:  Factors impacting patients care include: none   Reevaluation: After the interventions noted above, I reevaluated the patient and found that they have :improved  Co morbidities that complicate the patient evaluation  Past Medical History:  Diagnosis Date   Anxiety    Arthritis    Cancer (HCC)    Cervical cancer (HCC)    Depressed    Diastolic heart failure (HCC)    Grade 2   Drug addiction (HCC) Hep C   Foot abscess, left    Gastroesophageal reflux disease    Gastroparesis    Hepatitis C    pt denies any treatment   Hypertension    Mitral regurgitation    Moderate   Polysubstance dependence including opioid type drug, episodic abuse (HCC)    Psoriasis    Renal disorder    kidney stones      Dispostion: I considered admission for this patient, and patient will require hospital admission for pneumonia with new oxygen requirement and persistent COPD exacerbation     Final Clinical Impression(s) / ED Diagnoses Final diagnoses:  None     @PCDICTATION @    Karlyn Overman, MD 12/11/23 [phone]

## 2023-12-11 NOTE — ED Notes (Signed)
 Blood work and IV medication delayed due to inability to obtain IV access. Charge notified pt is in need of an US  IV. Charge agreeable.

## 2023-12-11 NOTE — ED Triage Notes (Signed)
 Pt bib ccems w/ c/o sob increasingly worse over the last few days. Pt has a hx of COPD but does not typically wear oxygen. EMS reports pt was 76% on rRA at their arrival. EMS placed pt on 5 L and o2 elevated to 96%. Pt was 86 % on RA at arrival to [facility]. Pt reports a significant smoking and vaping history, but has been cigarette free for 2 days. Pt is currently vaping.  Pt has hx of CHF and IV drug use as well. Pt reports 6 years clean. Pt has requested NO narcotic medication.

## 2023-12-11 NOTE — H&P (Signed)
 [facility] H&P   Patient Demographics:    Genette Huertas, is a 53 y.o. female  MRN: [id number]   DOB - 1970/07/27  Admit Date - 12/11/2023  Outpatient Primary MD for the patient is Twylla Galen, MD  Referring MD/NP/PA: Dr Garrett Kallman   Patient coming from: home  Chief Complaint  Patient presents with   Shortness of Breath      HPI:    Carrine Kroboth  is a 53 y.o. female, with medical history significant of  anxiety, arthritis, depression, chronic back pain, gastroparesis, hepatitis C, hypertension, polysubstance abuse, stopped using 6 years ago, currently on Suboxone ,  Patient presents today secondary to complaints of shortness of breath, cough, patient reports this has been ongoing for last 5 days, no improvement despite using her home nebulizers, prompted her to call EMS, she was found 76% on room air, ED she was saturating 80 to 84% on room air requiring 2 L oxygen, she denies chest pain or mopped assist. - In ED was hypoxic, chest x-ray significant for  pneumonia and initial exam noted for wheezing as well., labs significant for potassium 3.4, sodium 134, [facility] hospitalist consulted to admit   Review of systems:    A full 10 point Review of Systems was done, except as stated above, all other Review of Systems were negative.   With Past History of the following :    Past Medical History:  Diagnosis Date   Anxiety    Arthritis    Cancer (HCC)    Cervical cancer (HCC)    Depressed    Diastolic heart failure (HCC)    Grade 2   Drug addiction (HCC) Hep C   Foot abscess, left    Gastroesophageal reflux disease    Gastroparesis    Hepatitis C    pt denies any treatment   Hypertension    Mitral regurgitation    Moderate   Polysubstance dependence including opioid type drug, episodic abuse (HCC)    Psoriasis    Renal disorder    kidney stones      Past Surgical  History:  Procedure Laterality Date   APPENDECTOMY     APPLICATION OF WOUND VAC Left 10/10/2017   Procedure: APPLICATION OF WOUND VAC;  Surgeon: Timothy Ford, MD;  Location: [facility] OR;  Service: Orthopedics;  Laterality: Left;   ESOPHAGOGASTRODUODENOSCOPY  01/04/2008   Dr Gearldean Keepers reflux esophagitis, sm HH, gastritis, BRAVO positive for GERD on BID PPI   I & D EXTREMITY Left 10/03/2017   Procedure: IRRIGATION AND DEBRIDEMENT EXTREMITY;  Surgeon: Timothy Ford, MD;  Location: [facility] OR;  Service: Orthopedics;  Laterality: Left;   IRRIGATION AND DEBRIDEMENT FOOT Left 10/03/2017   LASER ABLATION CONDYLOMA CERVICAL / VULVAR     mandible surgery for fracture     SKIN SPLIT GRAFT Left 10/10/2017   Procedure: SKIN GRAFT SPLIT THICKNESS LEFT FOOT;  Surgeon: Julio Ohm,  Marshia Skene, MD;  Location: [facility] OR;  Service: Orthopedics;  Laterality: Left;   TEE WITHOUT CARDIOVERSION N/A 11/11/2022   Procedure: TRANSESOPHAGEAL ECHOCARDIOGRAM (TEE);  Surgeon: Mallipeddi, Vishnu P, MD;  Location: [facility];  Service: Cardiovascular;  Laterality: N/A;   TUBAL LIGATION        Social History:     Social History   Tobacco Use   Smoking status: Every Day    Current packs/day: 1.00    Average packs/day: 1 pack/day for 28.0 years (28.0 ttl pk-yrs)    Types: Cigarettes   Smokeless tobacco: Never  Substance Use Topics   Alcohol use: No    Comment: occ ( denies 3/ 21/19)        Family History :     Family History  Problem Relation Age of Onset   Hyperlipidemia Mother    Diabetes Mother    Hypertension Mother    Hypertension Sister    Hepatitis C Sister    Hypertension Father    Colon cancer Neg Hx    Liver disease Neg Hx       Home Medications:   Prior to Admission medications   Medication Sig Start Date End Date Taking? Authorizing Provider  acetaminophen  (TYLENOL ) 325 MG tablet Take 650 mg by mouth every 6 (six) hours as needed for moderate pain.   Yes [provider]  albuterol  (PROVENTIL )  (2.5 MG/3ML) 0.083% nebulizer solution USE ONE VIAL IN NEBULIZER EVERY 6 HOURS AS NEEDED 08/12/23  Yes [provider]  Buprenorphine  HCl-Naloxone  HCl 8-2 MG FILM Place 1 Film under the tongue 3 (three) times daily. 11/04/22  Yes [provider]  buPROPion (WELLBUTRIN XL) 300 MG 24 hr tablet TAKE ONE TABLET BY MOUTH EVERY MORNING for 30   Yes [provider]  busPIRone (BUSPAR) 7.5 MG tablet Take 7.5 mg by mouth 2 (two) times daily. 02/04/23  Yes [provider]  cyanocobalamin  (VITAMIN B12) 1000 MCG tablet Take 1 tablet (1,000 mcg total) by mouth daily. 09/26/23  Yes Kandala, Hyndavi, MD  doxycycline  (VIBRA -TABS) 100 MG tablet 1 tablet Orally twice a day for 10 days 08/12/23  Yes [provider]  empagliflozin  (JARDIANCE ) 10 MG TABS tablet TAKE ONE TABLET BY MOUTH ONCE DAILY 05/05/23  Yes Mallipeddi, Vishnu P, MD  folic acid  (FOLVITE ) 1 MG tablet Take 1 tablet (1 mg total) by mouth daily. 09/26/23  Yes Kandala, Hyndavi, MD  gabapentin (NEURONTIN) 600 MG tablet Take 600 mg by mouth 2 (two) times daily. 12/03/23  Yes [provider]  lisinopril -hydrochlorothiazide  (ZESTORETIC ) 10-12.5 MG tablet Take 1 tablet by mouth daily. 07/08/23  Yes [provider]  metoprolol  tartrate (LOPRESSOR ) 25 MG tablet Take 25 mg by mouth 2 (two) times daily. 11/17/23  Yes [provider]  mirtazapine  (REMERON ) 15 MG tablet Take 15 mg by mouth at bedtime. 02/25/23  Yes [provider]  nystatin (MYCOSTATIN) 100000 UNIT/ML suspension Take 5 mLs by mouth every 6 (six) hours. 10/08/23  Yes [provider]  pantoprazole  (PROTONIX ) 40 MG tablet Take 1 tablet (40 mg total) by mouth daily before breakfast. 01/26/23  Yes April Knack, Kristen S, PA-C  polyethylene glycol powder (GLYCOLAX /MIRALAX ) 17 GM/SCOOP powder Take by mouth. 12/03/23  Yes [provider]  tiotropium (SPIRIVA) 18 MCG inhalation capsule USE ONE CAPSULE BY INHALING THE CONTENT OF THE  CAPSULE USING THE HANDIHALER DEVICE ONCE DAILY. for 30   Yes [provider]  torsemide  (DEMADEX ) 20 MG tablet Take 1 tablet (20 mg total)  by mouth daily. 03/07/23  Yes Mallipeddi, Vishnu P, MD  Ipratropium-Albuterol  (COMBIVENT  RESPIMAT) 20-100 MCG/ACT AERS respimat Inhale 1 puff into the lungs every 6 (six) hours as needed for wheezing or shortness of breath. 11/11/22   Mason Sole, Pratik D, DO  methotrexate (RHEUMATREX) 2.5 MG tablet Take 15 mg by mouth once a week. Sunday    [provider]     Allergies:     Allergies  Allergen Reactions   Tramadol  Hcl Anaphylaxis, Hives, Itching and Swelling    Of tongue   Venofer  [Iron  Sucrose] Other (See Comments)    Pain/stiffness in hands and ankles.   Ketorolac  Nausea And Vomiting, Rash and Other (See Comments)   Propoxyphene Rash     Physical Exam:   Vitals  Blood pressure (!) 152/84, pulse 82, temperature 97.6 F (36.4 C), temperature source Oral, resp. rate 17, height 5\' 2"  (1.575 m), weight 83.1 kg, last menstrual period 01/20/2015, SpO2 90%.   1. General Frail, well-developed, no apparent distress  2. Normal affect and insight, Not Suicidal or Homicidal, Awake Alert, Oriented X 3.  3. No F.N deficits, ALL C.Nerves Intact, Strength 5/5 all 4 extremities, Sensation intact all 4 extremities, Plantars down going.  4. Ears and Eyes appear Normal, Conjunctivae clear, PERRLA. Moist Oral Mucosa.  5. Supple Neck, No JVD, No cervical lymphadenopathy appriciated, No Carotid Bruits.  6. Symmetrical Chest wall movement, diminished air entry with diffuse wheezing bilaterally  7. RRR, No Gallops, Rubs or Murmurs, No Parasternal Heave.  8. Positive Bowel Sounds, Abdomen Soft, No tenderness, No organomegaly appriciated,No rebound -guarding or rigidity.  9.  No Cyanosis, Normal Skin Turgor, No Skin Rash or Bruise.  10. Good muscle tone,  joints appear normal , no effusions, Normal ROM.     Data Review:    CBC Recent Labs   Lab 12/10/23 1352 12/11/23 1500  WBC 16.3* 12.5*  HGB 12.8 13.0  HCT 43.1 42.0  PLT 266 278  MCV 90.9 90.5  MCH 27.0 28.0  MCHC 29.7* 31.0  RDW 22.5* 22.5*  LYMPHSABS 2.1 1.7  MONOABS 0.4 0.4  EOSABS 0.0 0.1  BASOSABS 0.0 0.0   ------------------------------------------------------------------------------------------------------------------  Chemistries  Recent Labs  Lab 12/10/23 1352 12/11/23 1500  NA 132* 134*  K 3.4* 3.4*  CL 97* 96*  CO2 29 27  GLUCOSE 169* 120*  BUN 22* 16  CREATININE 1.06* 0.80  CALCIUM  8.8* 9.0  AST 19 17  ALT 16 15  ALKPHOS 55 62  BILITOT 0.6 0.4   ------------------------------------------------------------------------------------------------------------------ estimated creatinine clearance is 82.2 mL/min (by C-G formula based on SCr of 0.8 mg/dL). ------------------------------------------------------------------------------------------------------------------ No results for input(s): "TSH", "T4TOTAL", "T3FREE", "THYROIDAB" in the last 72 hours.  Invalid input(s): "FREET3"  Coagulation profile No results for input(s): "INR", "PROTIME" in the last 168 hours. ------------------------------------------------------------------------------------------------------------------- No results for input(s): "DDIMER" in the last 72 hours. -------------------------------------------------------------------------------------------------------------------  Cardiac Enzymes No results for input(s): "CKMB", "TROPONINI", "MYOGLOBIN" in the last 168 hours.  Invalid input(s): "CK" ------------------------------------------------------------------------------------------------------------------    Component Value Date/Time   BNP 360.0 (H) 12/11/2023 1500     ---------------------------------------------------------------------------------------------------------------  Urinalysis    Component Value Date/Time   COLORURINE YELLOW 10/02/2017 1326    APPEARANCEUR CLOUDY (A) 10/02/2017 1326   LABSPEC 1.019 10/02/2017 1326   PHURINE 6.0 10/02/2017 1326   GLUCOSEU NEGATIVE 10/02/2017 1326   HGBUR NEGATIVE 10/02/2017 1326   BILIRUBINUR NEGATIVE 10/02/2017 1326   KETONESUR NEGATIVE 10/02/2017 1326   PROTEINUR 30 (A) 10/02/2017 1326   UROBILINOGEN 0.2 09/02/2013  1619   NITRITE NEGATIVE 10/02/2017 1326   LEUKOCYTESUR TRACE (A) 10/02/2017 1326    ----------------------------------------------------------------------------------------------------------------   Imaging Results:    DG Chest Portable 1 View Result Date: 12/11/2023 CLINICAL DATA:  Dyspnea. EXAM: PORTABLE CHEST 1 VIEW COMPARISON:  11/18/2022. FINDINGS: Redemonstration of diffuse increased bilateral interstitial markings. There is superimposed new heterogeneous opacity overlying the right lower lung zone, laterally, without significant volume loss, favoring pneumonia. Follow-up to clearing is recommended. Bilateral lung fields are otherwise clear. Bilateral lateral costophrenic angles are clear. Stable cardio-mediastinal silhouette. No acute osseous abnormalities. The soft tissues are within normal limits. IMPRESSION: *New heterogeneous opacity overlying the right lower lung zone, laterally, favoring pneumonia. Follow-up to clearing is recommended. Electronically Signed   By: Beula Brunswick M.D.   On: 12/11/2023 15:43       Assessment & Plan:    Principal Problem:   Pneumonia Active Problems:   Tobacco use   Anxiety state   GERD   Acute hypoxic respiratory failure (HCC)   CAP (community acquired pneumonia)   CHF exacerbation (HCC)    Acute respiratory failure with hypoxia Pneumonia COPD exacerbation - Presents with dyspnea, cough, creased work of breathing, hypoxic 80 to 82% on room air, requiring 2 L oxygen -Significant wheezing, COPD exacerbation, continue with IV steroids, scheduled nebs and as needed albuterol  - Evidence of pneumonia on chest x-ray, continue with  IV Rocephin , azithromycin , she was encouraged with incentive spirometry, flutter valve, following sputum cultures, Legionella antigen and strep pneumonia antigen - Patient will need repeat imaging after treating her pneumonia to ensure resolution of opacity after treating her pneumonia given her history of heavy smoking   Chronic diastolic CHF -Echo in 2024 significant for preserved EF -No significant evidence of volume overload, on physical exam or chest x-ray despite her elevated BNP. - Resume torsemide  when stable -Continue with Jardiance   Hypertension -Continue with home medications   History of polysubstance dependence and abuse . - Patient reports she stopped using 6 years ago, she is currently on Suboxone , her UDS is negative, continue with home dose Suboxone     GERD - Continue PPI   Depression -Continue with home medications  Hypokalemia -Repleted  Hyponatremia - I will hold on further IV fluids given her history of chronic diastolic CHF, if remains persistent I will discontinue hydrochlorothiazide      DVT Prophylaxis Heparin   AM Labs Ordered, also please review Full Orders  Family Communication: Admission, patients condition and plan of care including tests being ordered have been discussed with the patient  who indicate understanding and agree with the plan and Code Status.  Code Status full code  Likely DC to home  Consults called: None  Admission status: Inpatient  Time spent in minutes : 70 minutes   Seena Dadds M.D on 12/11/2023 at 5:06 PM   [facility] - Office  [phone]

## 2023-12-11 NOTE — Progress Notes (Signed)
   12/11/23 2317  TOC Brief Assessment  Insurance and Status Reviewed  Patient has primary care physician Yes  Home environment has been reviewed From home  Prior level of function: Independent  Prior/Current Home Services No current home services  Social Drivers of Health Review SDOH reviewed interventions complete  Readmission risk has been reviewed Yes  Transition of care needs no transition of care needs at this time   Transition of Care Department [facility]) has reviewed patient and no other TOC needs have been identified at this time. We will continue to monitor patient advancement through interdisciplinary progression rounds. If new patient needs arise, please place a TOC consult.

## 2023-12-11 NOTE — ED Notes (Signed)
 ED TO INPATIENT HANDOFF REPORT  ED Nurse Name and Phone #: [phone]  S Name/Age/Gender [name] 53 y.o. female Room/Bed: APA16A/APA16A  Code Status   Code Status: Full Code  Home/SNF/Other Home Patient oriented to: self, place, time, and situation Is this baseline? Yes   Triage Complete: Triage complete  Chief Complaint Pneumonia [J18.9]  Triage Note Pt bib ccems w/ c/o sob increasingly worse over the last few days. Pt has a hx of COPD but does not typically wear oxygen. EMS reports pt was 76% on rRA at their arrival. EMS placed pt on 5 L and o2 elevated to 96%. Pt was 86 % on RA at arrival to [facility]. Pt reports a significant smoking and vaping history, but has been cigarette free for 2 days. Pt is currently vaping.  Pt has hx of CHF and IV drug use as well. Pt reports 6 years clean. Pt has requested NO narcotic medication.    Allergies Allergies  Allergen Reactions   Tramadol  Hcl Anaphylaxis, Hives, Itching and Swelling    Of tongue   Venofer  [Iron  Sucrose] Other (See Comments)    Pain/stiffness in hands and ankles.   Ketorolac  Nausea And Vomiting, Rash and Other (See Comments)   Propoxyphene Rash    Level of Care/Admitting Diagnosis ED Disposition     ED Disposition  Admit   Condition  --   Comment  Hospital Area: [facility] [[id number]]  Level of Care: Telemetry [5]  Covid Evaluation: Asymptomatic - no recent exposure (last 10 days) testing not required  Diagnosis: Pneumonia [188416]  Admitting Physician: Shyrl Doyne  Attending Physician: Osborne Blazer, DAWOOD S [[id number]  Certification:: I certify this patient will need inpatient services for at least 2 midnights  Expected Medical Readiness: 12/14/2023          B Medical/Surgery History Past Medical History:  Diagnosis Date   Anxiety    Arthritis    Cancer (HCC)    Cervical cancer (HCC)    Depressed    Diastolic heart failure (HCC)    Grade 2   Drug addiction (HCC) Hep C    Foot abscess, left    Gastroesophageal reflux disease    Gastroparesis    Hepatitis C    pt denies any treatment   Hypertension    Mitral regurgitation    Moderate   Polysubstance dependence including opioid type drug, episodic abuse (HCC)    Psoriasis    Renal disorder    kidney stones   Past Surgical History:  Procedure Laterality Date   APPENDECTOMY     APPLICATION OF WOUND VAC Left 10/10/2017   Procedure: APPLICATION OF WOUND VAC;  Surgeon: Timothy Ford, MD;  Location: [facility] OR;  Service: Orthopedics;  Laterality: Left;   ESOPHAGOGASTRODUODENOSCOPY  01/04/2008   Dr Gearldean Keepers reflux esophagitis, sm HH, gastritis, BRAVO positive for GERD on BID PPI   I & D EXTREMITY Left 10/03/2017   Procedure: IRRIGATION AND DEBRIDEMENT EXTREMITY;  Surgeon: Timothy Ford, MD;  Location: [facility];  Service: Orthopedics;  Laterality: Left;   IRRIGATION AND DEBRIDEMENT FOOT Left 10/03/2017   LASER ABLATION CONDYLOMA CERVICAL / VULVAR     mandible surgery for fracture     SKIN SPLIT GRAFT Left 10/10/2017   Procedure: SKIN GRAFT SPLIT THICKNESS LEFT FOOT;  Surgeon: Timothy Ford, MD;  Location: [facility] OR;  Service: Orthopedics;  Laterality: Left;   TEE WITHOUT CARDIOVERSION N/A 11/11/2022   Procedure: TRANSESOPHAGEAL ECHOCARDIOGRAM (TEE);  Surgeon: Mallipeddi, Vishnu  P, MD;  Location: [facility];  Service: Cardiovascular;  Laterality: N/A;   TUBAL LIGATION       A IV Location/Drains/Wounds Patient Lines/Drains/Airways Status     Active Line/Drains/Airways     Name Placement date Placement time Site Days   Implanted Port Right Other (Comment) --  --  Other (Comment)  --   Peripheral IV 11/18/22 22 G 1" Right;Posterior Hand 11/18/22  1848  Hand  388   Peripheral IV 12/11/23 20 G 1.88" Anterior;Right Forearm 12/11/23  1501  Forearm  less than 1   Negative Pressure Wound Therapy Foot Left 10/03/17  1230  --  2260   Negative Pressure Wound Therapy Foot Left 10/10/17  1056  --  2253             Intake/Output Last 24 hours  Intake/Output Summary (Last 24 hours) at 12/11/2023 1730 Last data filed at 12/11/2023 1701 Gross per 24 hour  Intake 100.19 ml  Output --  Net 100.19 ml    Labs/Imaging Results for orders placed or performed during the hospital encounter of 12/11/23 (from the past 48 hours)  Resp panel by RT-PCR (RSV, Flu A&B, Covid) Anterior Nasal Swab     Status: None   Collection Time: 12/11/23  2:20 PM   Specimen: Anterior Nasal Swab  Result Value Ref Range   SARS Coronavirus 2 by RT PCR NEGATIVE NEGATIVE    Comment: (NOTE) SARS-CoV-2 target nucleic acids are NOT DETECTED.  The SARS-CoV-2 RNA is generally detectable in upper respiratory specimens during the acute phase of infection. The lowest concentration of SARS-CoV-2 viral copies this assay can detect is 138 copies/mL. A negative result does not preclude SARS-Cov-2 infection and should not be used as the sole basis for treatment or other patient management decisions. A negative result may occur with  improper specimen collection/handling, submission of specimen other than nasopharyngeal swab, presence of viral mutation(s) within the areas targeted by this assay, and inadequate number of viral copies(<138 copies/mL). A negative result must be combined with clinical observations, patient history, and epidemiological information. The expected result is Negative.  Fact Sheet for Patients:  BloggerCourse.com  Fact Sheet for Healthcare Providers:  SeriousBroker.it  This test is no t yet approved or cleared by the [place]  FDA and  has been authorized for detection and/or diagnosis of SARS-CoV-2 by FDA under an Emergency Use Authorization (EUA). This EUA will remain  in effect (meaning this test can be used) for the duration of the COVID-19 declaration under Section 564(b)(1) of the Act, 21 U.S.C.section 360bbb-3(b)(1), unless the authorization is  terminated  or revoked sooner.       Influenza A by PCR NEGATIVE NEGATIVE   Influenza B by PCR NEGATIVE NEGATIVE    Comment: (NOTE) The Xpert Xpress SARS-CoV-2/FLU/RSV plus assay is intended as an aid in the diagnosis of influenza from Nasopharyngeal swab specimens and should not be used as a sole basis for treatment. Nasal washings and aspirates are unacceptable for Xpert Xpress SARS-CoV-2/FLU/RSV testing.  Fact Sheet for Patients: BloggerCourse.com  Fact Sheet for Healthcare Providers: SeriousBroker.it  This test is not yet approved or cleared by the [place]  FDA and has been authorized for detection and/or diagnosis of SARS-CoV-2 by FDA under an Emergency Use Authorization (EUA). This EUA will remain in effect (meaning this test can be used) for the duration of the COVID-19 declaration under Section 564(b)(1) of the Act, 21 U.S.C. section 360bbb-3(b)(1), unless the authorization is terminated or revoked.  Resp Syncytial Virus by PCR NEGATIVE NEGATIVE    Comment: (NOTE) Fact Sheet for Patients: BloggerCourse.com  Fact Sheet for Healthcare Providers: SeriousBroker.it  This test is not yet approved or cleared by the [place]  FDA and has been authorized for detection and/or diagnosis of SARS-CoV-2 by FDA under an Emergency Use Authorization (EUA). This EUA will remain in effect (meaning this test can be used) for the duration of the COVID-19 declaration under Section 564(b)(1) of the Act, 21 U.S.C. section 360bbb-3(b)(1), unless the authorization is terminated or revoked.  Performed at [facility], [address] [place], [place] [address]   Comprehensive metabolic panel     Status: Abnormal   Collection Time: 12/11/23  3:00 PM  Result Value Ref Range   Sodium 134 (L) 135 - 145 mmol/L   Potassium 3.4 (L) 3.5 - 5.1 mmol/L   Chloride 96 (L) 98 - 111 mmol/L    CO2 27 22 - 32 mmol/L   Glucose, Bld 120 (H) 70 - 99 mg/dL    Comment: Glucose reference range applies only to samples taken after fasting for at least 8 hours.   BUN 16 6 - 20 mg/dL   Creatinine, Ser 5.28 0.44 - 1.00 mg/dL   Calcium  9.0 8.9 - 10.3 mg/dL   Total Protein 7.9 6.5 - 8.1 g/dL   Albumin 3.3 (L) 3.5 - 5.0 g/dL   AST 17 15 - 41 U/L   ALT 15 0 - 44 U/L   Alkaline Phosphatase 62 38 - 126 U/L   Total Bilirubin 0.4 0.0 - 1.2 mg/dL   GFR, Estimated >41 >32 mL/min    Comment: (NOTE) Calculated using the CKD-EPI Creatinine Equation (2021)    Anion gap 11 5 - 15    Comment: Performed at [facility], [address], [place], [place] [address]  Troponin I (High Sensitivity)     Status: None   Collection Time: 12/11/23  3:00 PM  Result Value Ref Range   Troponin I (High Sensitivity) 5 <18 ng/L    Comment: (NOTE) Elevated high sensitivity troponin I (hsTnI) values and significant  changes across serial measurements may suggest ACS but many other  chronic and acute conditions are known to elevate hsTnI results.  Refer to the "Links" section for chest pain algorithms and additional  guidance. Performed at [facility], [address], [place], [place] [address]   CBC with Differential     Status: Abnormal   Collection Time: 12/11/23  3:00 PM  Result Value Ref Range   WBC 12.5 (H) 4.0 - 10.5 K/uL   RBC 4.64 3.87 - 5.11 MIL/uL   Hemoglobin 13.0 12.0 - 15.0 g/dL   HCT 66.4 40.3 - 47.4 %   MCV 90.5 80.0 - 100.0 fL   MCH 28.0 26.0 - 34.0 pg   MCHC 31.0 30.0 - 36.0 g/dL   RDW 25.9 (H) 56.3 - 87.5 %   Platelets 278 150 - 400 K/uL   nRBC 0.2 0.0 - 0.2 %   Neutrophils Relative % 80 %   Neutro Abs 10.1 (H) 1.7 - 7.7 K/uL   Lymphocytes Relative 14 %   Lymphs Abs 1.7 0.7 - 4.0 K/uL   Monocytes Relative 4 %   Monocytes Absolute 0.4 0.1 - 1.0 K/uL   Eosinophils Relative 1 %   Eosinophils Absolute 0.1 0.0 - 0.5 K/uL   Basophils Relative 0 %   Basophils Absolute 0.0 0.0 - 0.1 K/uL    WBC Morphology MORPHOLOGY UNREMARKABLE  RBC Morphology ANISOCYTES    Smear Review MORPHOLOGY UNREMARKABLE    Immature Granulocytes 1 %   Abs Immature Granulocytes 0.11 (H) 0.00 - 0.07 K/uL   Polychromasia PRESENT     Comment: Performed at [facility], [address], [place], [place] [address]  Brain natriuretic peptide     Status: Abnormal   Collection Time: 12/11/23  3:00 PM  Result Value Ref Range   B Natriuretic Peptide 360.0 (H) 0.0 - 100.0 pg/mL    Comment: Performed at [facility], [address] [place], [place] [address]  Rapid urine drug screen (hospital performed)     Status: None   Collection Time: 12/11/23  4:40 PM  Result Value Ref Range   Opiates NONE DETECTED NONE DETECTED   Cocaine NONE DETECTED NONE DETECTED   Benzodiazepines NONE DETECTED NONE DETECTED   Amphetamines NONE DETECTED NONE DETECTED   Tetrahydrocannabinol NONE DETECTED NONE DETECTED   Barbiturates NONE DETECTED NONE DETECTED    Comment: (NOTE) DRUG SCREEN FOR MEDICAL PURPOSES ONLY.  IF CONFIRMATION IS NEEDED FOR ANY PURPOSE, NOTIFY LAB WITHIN 5 DAYS.  LOWEST DETECTABLE LIMITS FOR URINE DRUG SCREEN Drug Class                     Cutoff (ng/mL) Amphetamine and metabolites    1000 Barbiturate and metabolites    200 Benzodiazepine                 200 Opiates and metabolites        300 Cocaine and metabolites        300 THC                            50 Performed at [facility], [address] [place], [place] [address]    DG Chest Portable 1 View Result Date: 12/11/2023 CLINICAL DATA:  Dyspnea. EXAM: PORTABLE CHEST 1 VIEW COMPARISON:  11/18/2022. FINDINGS: Redemonstration of diffuse increased bilateral interstitial markings. There is superimposed new heterogeneous opacity overlying the right lower lung zone, laterally, without significant volume loss, favoring pneumonia. Follow-up to clearing is recommended. Bilateral lung fields are otherwise clear. Bilateral lateral costophrenic angles are  clear. Stable cardio-mediastinal silhouette. No acute osseous abnormalities. The soft tissues are within normal limits. IMPRESSION: *New heterogeneous opacity overlying the right lower lung zone, laterally, favoring pneumonia. Follow-up to clearing is recommended. Electronically Signed   By: Beula Brunswick M.D.   On: 12/11/2023 15:43    Pending Labs Unresulted Labs (From admission, onward)     Start     Ordered   12/12/23 0500  HIV Antibody (routine testing w rflx)  (HIV Antibody (Routine testing w reflex) panel)  Tomorrow morning,   R        12/11/23 1704   12/12/23 0500  Basic metabolic panel  Tomorrow morning,   R        12/11/23 1704   12/12/23 0500  CBC  Tomorrow morning,   R        12/11/23 1704   12/11/23 1705  Expectorated Sputum Assessment w Gram Stain, Rflx to Resp Cult  (COPD / Pneumonia / Cellulitis / Lower Extremity Wound (Diabetic Foot Infection))  Once,   R        12/11/23 1704   12/11/23 1705  Legionella Pneumophila Serogp 1 Ur Ag  (COPD / Pneumonia / Cellulitis / Lower Extremity Wound (Diabetic Foot Infection))  Once,   R  12/11/23 1704   12/11/23 1705  Strep pneumoniae urinary antigen  (COPD / Pneumonia / Cellulitis / Lower Extremity Wound (Diabetic Foot Infection))  Once,   R        12/11/23 1704            Vitals/Pain Today's Vitals   12/11/23 1650 12/11/23 1703 12/11/23 1704 12/11/23 1705  BP:      Pulse: 75 80 83 82  Resp: (!) 26 15 17 17   Temp:      TempSrc:      SpO2: (!) 81% (!) 89% (!) 89% 90%  Weight:      Height:      PainSc:        Isolation Precautions No active isolations  Medications Medications  azithromycin  (ZITHROMAX ) 500 mg in sodium chloride  0.9 % 250 mL IVPB (500 mg Intravenous New Bag/Given 12/11/23 1703)  metoprolol  tartrate (LOPRESSOR ) tablet 25 mg (has no administration in time range)  buPROPion (WELLBUTRIN XL) 24 hr tablet 300 mg (has no administration in time range)  busPIRone (BUSPAR) tablet 7.5 mg (has no  administration in time range)  mirtazapine  (REMERON ) tablet 15 mg (has no administration in time range)  empagliflozin  (JARDIANCE ) tablet 10 mg (has no administration in time range)  pantoprazole  (PROTONIX ) EC tablet 40 mg (has no administration in time range)  cyanocobalamin  (VITAMIN B12) tablet 1,000 mcg (has no administration in time range)  folic acid  (FOLVITE ) tablet 1 mg (has no administration in time range)  gabapentin (NEURONTIN) capsule 600 mg (has no administration in time range)  heparin  injection 5,000 Units (has no administration in time range)  acetaminophen  (TYLENOL ) tablet 650 mg (has no administration in time range)    Or  acetaminophen  (TYLENOL ) suppository 650 mg (has no administration in time range)  nicotine  (NICODERM CQ  - dosed in mg/24 hours) patch 21 mg (has no administration in time range)  albuterol  (PROVENTIL ) (2.5 MG/3ML) 0.083% nebulizer solution 2.5 mg (has no administration in time range)  ipratropium-albuterol  (DUONEB) 0.5-2.5 (3) MG/3ML nebulizer solution 3 mL (has no administration in time range)  cefTRIAXone  (ROCEPHIN ) 2 g in sodium chloride  0.9 % 100 mL IVPB (has no administration in time range)  methylPREDNISolone  sodium succinate (SOLU-MEDROL ) 125 mg/2 mL injection 125 mg (has no administration in time range)  potassium chloride  SA (KLOR-CON  M) CR tablet 40 mEq (has no administration in time range)  lisinopril  (ZESTRIL ) tablet 10 mg (has no administration in time range)    And  hydrochlorothiazide  (HYDRODIURIL ) tablet 12.5 mg (has no administration in time range)  umeclidinium bromide (INCRUSE ELLIPTA) 62.5 MCG/ACT 1 puff (has no administration in time range)  buprenorphine -naloxone  (SUBOXONE ) 2-0.5 mg per SL tablet 2 tablet (has no administration in time range)  methylPREDNISolone  sodium succinate (SOLU-MEDROL ) 125 mg/2 mL injection 125 mg (125 mg Intravenous Given 12/11/23 1515)  ipratropium-albuterol  (DUONEB) 0.5-2.5 (3) MG/3ML nebulizer solution 9 mL (9  mLs Nebulization Given 12/11/23 1403)  albuterol  (PROVENTIL ) (2.5 MG/3ML) 0.083% nebulizer solution (10 mg/hr Nebulization Given 12/11/23 1547)  cefTRIAXone  (ROCEPHIN ) 2 g in sodium chloride  0.9 % 100 mL IVPB (0 g Intravenous Stopped 12/11/23 1701)  gabapentin (NEURONTIN) capsule 600 mg (600 mg Oral Given 12/11/23 1640)    Mobility walks     Focused Assessments    R Recommendations: See Admitting Provider Note  Report given to:   Additional Notes:

## 2023-12-12 DIAGNOSIS — J9601 Acute respiratory failure with hypoxia: Secondary | ICD-10-CM | POA: Diagnosis not present

## 2023-12-12 DIAGNOSIS — J189 Pneumonia, unspecified organism: Secondary | ICD-10-CM | POA: Diagnosis not present

## 2023-12-12 LAB — GLUCOSE, CAPILLARY
Glucose-Capillary: 194 mg/dL — ABNORMAL HIGH (ref 70–99)
Glucose-Capillary: 200 mg/dL — ABNORMAL HIGH (ref 70–99)
Glucose-Capillary: 308 mg/dL — ABNORMAL HIGH (ref 70–99)
Glucose-Capillary: 290 mg/dL — ABNORMAL HIGH (ref 70–99)

## 2023-12-12 LAB — STREP PNEUMONIAE URINARY ANTIGEN: Strep Pneumo Urinary Antigen: NEGATIVE

## 2023-12-12 LAB — BASIC METABOLIC PANEL WITH GFR
Anion gap: 7 (ref 5–15)
BUN: 20 mg/dL (ref 6–20)
CO2: 29 mmol/L (ref 22–32)
Calcium: 9.2 mg/dL (ref 8.9–10.3)
Chloride: 100 mmol/L (ref 98–111)
Creatinine, Ser: 0.97 mg/dL (ref 0.44–1.00)
GFR, Estimated: >60 mL/min (ref >60)
Glucose, Bld: 289 mg/dL — ABNORMAL HIGH (ref 70–99)
Potassium: 5.1 mmol/L (ref 3.5–5.1)
Sodium: 136 mmol/L (ref 135–145)

## 2023-12-12 LAB — CBC
HCT: 41.5 % (ref 36.0–46.0)
Hemoglobin: 12.5 g/dL (ref 12.0–15.0)
MCH: 27.8 pg (ref 26.0–34.0)
MCHC: 30.1 g/dL (ref 30.0–36.0)
MCV: 92.4 fL (ref 80.0–100.0)
Platelets: 266 10*3/uL (ref 150–400)
RBC: 4.49 MIL/uL (ref 3.87–5.11)
RDW: 22.1 % — ABNORMAL HIGH (ref 11.5–15.5)
WBC: 8.6 10*3/uL (ref 4.0–10.5)
nRBC: 0.2 % (ref 0.0–0.2)

## 2023-12-12 LAB — HEMOGLOBIN A1C
Hgb A1c MFr Bld: 6.4 % — ABNORMAL HIGH (ref 4.8–5.6)
Mean Plasma Glucose: 136.98 mg/dL

## 2023-12-12 LAB — HIV ANTIBODY (ROUTINE TESTING W REFLEX): HIV Screen 4th Generation wRfx: NONREACTIVE

## 2023-12-12 MED ORDER — TORSEMIDE 20 MG PO TABS
20.0000 mg | ORAL_TABLET | Freq: Every day | ORAL | Status: DC
Start: 2023-12-12 — End: 2023-12-12

## 2023-12-12 MED ORDER — INSULIN ASPART 100 UNIT/ML IJ SOLN
5.0000 [IU] | Freq: Three times a day (TID) | INTRAMUSCULAR | Status: DC
  Administered 2023-12-12: 5 [IU] via SUBCUTANEOUS

## 2023-12-12 MED ORDER — TORSEMIDE 20 MG PO TABS
20.0000 mg | ORAL_TABLET | Freq: Every day | ORAL | Status: DC
  Administered 2023-12-13 – 2023-12-15 (×3): 20 mg via ORAL
  Filled 2023-12-12 (×3): qty 1

## 2023-12-12 MED ORDER — GUAIFENESIN-DM 100-10 MG/5ML PO SYRP
5.0000 mL | ORAL_SOLUTION | ORAL | Status: DC | PRN
Start: 2023-12-12 — End: 2023-12-15
  Administered 2023-12-12: 5 mL via ORAL
  Filled 2023-12-12: qty 5

## 2023-12-12 NOTE — Plan of Care (Signed)

## 2023-12-12 NOTE — Progress Notes (Signed)
 PROGRESS NOTE   [name]  ZOX:[id number] DOB: [birth year] DOA: 12/11/2023 PCP: Twylla Galen, MD   Chief Complaint  Patient presents with   Shortness of Breath   Level of care: Telemetry  Brief Admission History:  53 y.o. female, with medical history significant of  anxiety, arthritis, depression, chronic back pain, gastroparesis, hepatitis C, hypertension, polysubstance abuse, stopped using 6 years ago, currently on Suboxone .  Patient presents today secondary to complaints of shortness of breath, cough, patient reports this has been ongoing for last 5 days, no improvement despite using her home nebulizers, prompted her to call EMS, she was found 76% on room air, ED she was saturating 80 to 84% on room air requiring 2 L oxygen, she denies chest pain or mopped assist. - In ED was hypoxic, chest x-ray significant for  pneumonia and initial exam noted for wheezing as well., labs significant for potassium 3.4, sodium 134, [facility] hospitalist consulted to admit   Assessment and Plan:  Acute respiratory failure with hypoxia Pneumonia COPD exacerbation - Presented with dyspnea, cough, creased work of breathing, hypoxic 80 to 82% on room air, requiring 2 L oxygen - Significant wheezing, COPD exacerbation, continue with IV steroids, scheduled nebs and as needed albuterol  - Evidence of pneumonia on chest x-ray, continue with IV Rocephin , azithromycin , she was encouraged with incentive spirometry, flutter valve, following sputum cultures, Legionella antigen and strep pneumonia antigen - Patient will need repeat imaging after treating her pneumonia to ensure resolution of opacity after treating her pneumonia given her history of heavy smoking   Chronic diastolic CHF -Echo in 2024 significant for preserved EF -No significant evidence of volume overload, on physical exam or chest x-ray despite her elevated BNP. -Continue with Jardiance  and torsemide    Hypertension -Continue with home  medications   History of polysubstance dependence and abuse . - Patient reports she stopped using 6 years ago, she is currently on Suboxone , her UDS is negative, continue with home dose Suboxone     GERD - Continue PPI   Depression -Continue with home medications   Hypokalemia -Repleted   Hyponatremia - improved    DVT prophylaxis: heparin  Code Status:  Family Communication:  Disposition:    Consultants:   Procedures:   Antimicrobials:    Subjective: Pt reports having cough, wheezing and chest congestion symptoms worse this morning.   Objective: Vitals:   12/12/23 0825 12/12/23 0826 12/12/23 0834 12/12/23 1238  BP: (!) 142/93   (!) 151/92  Pulse: (!) 51   (!) 56  Resp:      Temp:    97.8 F (36.6 C)  TempSrc:    Oral  SpO2: (!) 87% 92% 92% 92%  Weight:      Height:        Intake/Output Summary (Last 24 hours) at 12/12/2023 1311 Last data filed at 12/12/2023 0919 Gross per 24 hour  Intake 590.19 ml  Output --  Net 590.19 ml   Filed Weights   12/11/23 1351 12/11/23 1824  Weight: 83.1 kg 81.7 kg   Examination:  General exam: Appears calm and comfortable  Respiratory system: diffuse expiratory wheezing and rales, moderate increased work of breathing.   Cardiovascular system: normal S1 & S2 heard. No JVD, murmurs, rubs, gallops or clicks. No pedal edema. Gastrointestinal system: Abdomen is nondistended, soft and nontender. No organomegaly or masses felt. Normal bowel sounds heard. Central nervous system: Alert and oriented. No focal neurological deficits. Extremities: Symmetric 5 x 5 power. Skin: No rashes, lesions or ulcers.  Psychiatry: Judgement and insight appear normal. Mood & affect appropriate.   Data Reviewed: I have personally reviewed following labs and imaging studies  CBC: Recent Labs  Lab 12/10/23 1352 12/11/23 1500 12/12/23 0502  WBC 16.3* 12.5* 8.6  NEUTROABS 13.7* 10.1*  --   HGB 12.8 13.0 12.5  HCT 43.1 42.0 41.5  MCV 90.9 90.5  92.4  PLT 266 278 266    Basic Metabolic Panel: Recent Labs  Lab 12/10/23 1352 12/11/23 1500 12/12/23 0502  NA 132* 134* 136  K 3.4* 3.4* 5.1  CL 97* 96* 100  CO2 29 27 29   GLUCOSE 169* 120* 289*  BUN 22* 16 20  CREATININE 1.06* 0.80 0.97  CALCIUM  8.8* 9.0 9.2    CBG: Recent Labs  Lab 12/11/23 2204 12/12/23 0717 12/12/23 1125  GLUCAP 338* 194* 200*    Recent Results (from the past 240 hours)  Resp panel by RT-PCR (RSV, Flu A&B, Covid) Anterior Nasal Swab     Status: None   Collection Time: 12/11/23  2:20 PM   Specimen: Anterior Nasal Swab  Result Value Ref Range Status   SARS Coronavirus 2 by RT PCR NEGATIVE NEGATIVE Final    Comment: (NOTE) SARS-CoV-2 target nucleic acids are NOT DETECTED.  The SARS-CoV-2 RNA is generally detectable in upper respiratory specimens during the acute phase of infection. The lowest concentration of SARS-CoV-2 viral copies this assay can detect is 138 copies/mL. A negative result does not preclude SARS-Cov-2 infection and should not be used as the sole basis for treatment or other patient management decisions. A negative result may occur with  improper specimen collection/handling, submission of specimen other than nasopharyngeal swab, presence of viral mutation(s) within the areas targeted by this assay, and inadequate number of viral copies(<138 copies/mL). A negative result must be combined with clinical observations, patient history, and epidemiological information. The expected result is Negative.  Fact Sheet for Patients:  BloggerCourse.com  Fact Sheet for Healthcare Providers:  SeriousBroker.it  This test is no t yet approved or cleared by the [place]  FDA and  has been authorized for detection and/or diagnosis of SARS-CoV-2 by FDA under an Emergency Use Authorization (EUA). This EUA will remain  in effect (meaning this test can be used) for the duration of  the COVID-19 declaration under Section 564(b)(1) of the Act, 21 U.S.C.section 360bbb-3(b)(1), unless the authorization is terminated  or revoked sooner.       Influenza A by PCR NEGATIVE NEGATIVE Final   Influenza B by PCR NEGATIVE NEGATIVE Final    Comment: (NOTE) The Xpert Xpress SARS-CoV-2/FLU/RSV plus assay is intended as an aid in the diagnosis of influenza from Nasopharyngeal swab specimens and should not be used as a sole basis for treatment. Nasal washings and aspirates are unacceptable for Xpert Xpress SARS-CoV-2/FLU/RSV testing.  Fact Sheet for Patients: BloggerCourse.com  Fact Sheet for Healthcare Providers: SeriousBroker.it  This test is not yet approved or cleared by the [place]  FDA and has been authorized for detection and/or diagnosis of SARS-CoV-2 by FDA under an Emergency Use Authorization (EUA). This EUA will remain in effect (meaning this test can be used) for the duration of the COVID-19 declaration under Section 564(b)(1) of the Act, 21 U.S.C. section 360bbb-3(b)(1), unless the authorization is terminated or revoked.     Resp Syncytial Virus by PCR NEGATIVE NEGATIVE Final    Comment: (NOTE) Fact Sheet for Patients: BloggerCourse.com  Fact Sheet for Healthcare Providers: SeriousBroker.it  This test is not yet approved or cleared by  the [place]  FDA and has been authorized for detection and/or diagnosis of SARS-CoV-2 by FDA under an Emergency Use Authorization (EUA). This EUA will remain in effect (meaning this test can be used) for the duration of the COVID-19 declaration under Section 564(b)(1) of the Act, 21 U.S.C. section 360bbb-3(b)(1), unless the authorization is terminated or revoked.  Performed at [facility], [address], [place], [place] [address]      Radiology Studies: DG Chest Portable 1 View Result Date:  12/11/2023 CLINICAL DATA:  Dyspnea. EXAM: PORTABLE CHEST 1 VIEW COMPARISON:  11/18/2022. FINDINGS: Redemonstration of diffuse increased bilateral interstitial markings. There is superimposed new heterogeneous opacity overlying the right lower lung zone, laterally, without significant volume loss, favoring pneumonia. Follow-up to clearing is recommended. Bilateral lung fields are otherwise clear. Bilateral lateral costophrenic angles are clear. Stable cardio-mediastinal silhouette. No acute osseous abnormalities. The soft tissues are within normal limits. IMPRESSION: *New heterogeneous opacity overlying the right lower lung zone, laterally, favoring pneumonia. Follow-up to clearing is recommended. Electronically Signed   By: Beula Brunswick M.D.   On: 12/11/2023 15:43   Scheduled Meds:  buprenorphine -naloxone   2 tablet Sublingual TID   buPROPion  300 mg Oral Daily   busPIRone  7.5 mg Oral BID   cyanocobalamin   1,000 mcg Oral Daily   empagliflozin   10 mg Oral Daily   folic acid   1 mg Oral Daily   gabapentin  600 mg Oral BID   heparin   5,000 Units Subcutaneous Q8H   lisinopril   10 mg Oral Daily   And   hydrochlorothiazide   12.5 mg Oral Daily   insulin  aspart  0-5 Units Subcutaneous QHS   insulin  aspart  0-9 Units Subcutaneous TID WC   ipratropium-albuterol   3 mL Nebulization QID   methylPREDNISolone  (SOLU-MEDROL ) injection  125 mg Intravenous Daily   metoprolol  tartrate  25 mg Oral BID   mirtazapine   15 mg Oral QHS   nicotine   21 mg Transdermal Daily   pantoprazole   40 mg Oral QAC breakfast   torsemide   20 mg Oral Daily   umeclidinium bromide  1 puff Inhalation Daily   Continuous Infusions:  azithromycin  Stopped (12/11/23 1805)   cefTRIAXone  (ROCEPHIN )  IV      LOS: 1 day   Time spent: 55 mins  Rylin Saez Lincoln Renshaw, MD How to contact the [facility] Attending or Consulting provider 7A - 7P or covering provider during after hours 7P -7A, for this patient?  Check the care team in [facility] and look for a)  attending/consulting [facility] provider listed and b) the [facility] team listed Log into [email or web address] to find provider on call.  Locate the [facility] provider you are looking for under [facility] and page to a number that you can be directly reached. If you still have difficulty reaching the provider, please page the [facility] (Director on Call) for the Hospitalists listed on amion for assistance.  12/12/2023, 1:11 PM

## 2023-12-12 NOTE — Hospital Course (Signed)
 53 y.o. female, with medical history significant of  anxiety, arthritis, depression, chronic back pain, gastroparesis, hepatitis C, hypertension, polysubstance abuse, stopped using 6 years ago, currently on Suboxone .  Patient presents today secondary to complaints of shortness of breath, cough, patient reports this has been ongoing for last 5 days, no improvement despite using her home nebulizers, prompted her to call EMS, she was found 76% on room air, ED she was saturating 80 to 84% on room air requiring 2 L oxygen, she denies chest pain or mopped assist. - In ED was hypoxic, chest x-ray significant for  pneumonia and initial exam noted for wheezing as well., labs significant for potassium 3.4, sodium 134, [facility] hospitalist consulted to admit

## 2023-12-13 DIAGNOSIS — J189 Pneumonia, unspecified organism: Secondary | ICD-10-CM | POA: Diagnosis not present

## 2023-12-13 DIAGNOSIS — J9601 Acute respiratory failure with hypoxia: Secondary | ICD-10-CM | POA: Diagnosis not present

## 2023-12-13 LAB — GLUCOSE, CAPILLARY
Glucose-Capillary: 157 mg/dL — ABNORMAL HIGH (ref 70–99)
Glucose-Capillary: 137 mg/dL — ABNORMAL HIGH (ref 70–99)
Glucose-Capillary: 317 mg/dL — ABNORMAL HIGH (ref 70–99)
Glucose-Capillary: 283 mg/dL — ABNORMAL HIGH (ref 70–99)

## 2023-12-13 MED ORDER — INSULIN ASPART 100 UNIT/ML IJ SOLN
6.0000 [IU] | Freq: Three times a day (TID) | INTRAMUSCULAR | Status: DC
  Administered 2023-12-13 – 2023-12-14 (×4): 6 [IU] via SUBCUTANEOUS

## 2023-12-13 MED ORDER — INSULIN GLARGINE-YFGN 100 UNIT/ML ~~LOC~~ SOLN
18.0000 [IU] | Freq: Every day | SUBCUTANEOUS | Status: DC
Start: 2023-12-13 — End: 2023-12-14
  Administered 2023-12-13: 18 [IU] via SUBCUTANEOUS
  Filled 2023-12-13 (×2): qty 0.18

## 2023-12-13 MED ORDER — METOPROLOL TARTRATE 25 MG PO TABS
12.5000 mg | ORAL_TABLET | Freq: Two times a day (BID) | ORAL | Status: DC
  Administered 2023-12-13: 12.5 mg via ORAL
  Filled 2023-12-13 (×2): qty 1

## 2023-12-13 NOTE — Plan of Care (Signed)
  Problem: Health Behavior/Discharge Planning: Goal: Ability to manage health-related needs will improve Outcome: Progressing   Problem: Clinical Measurements: Goal: Diagnostic test results will improve Outcome: Progressing   Problem: Nutrition: Goal: Adequate nutrition will be maintained Outcome: Progressing

## 2023-12-13 NOTE — Progress Notes (Deleted)
 1610  Patient working with  NT, unavailable for nebulizer at this time.

## 2023-12-13 NOTE — Progress Notes (Signed)
 PROGRESS NOTE   [name]  ZOX:[id number] DOB: 1970-10-14 DOA: 12/11/2023 PCP: Twylla Galen, MD   Chief Complaint  Patient presents with   Shortness of Breath   Level of care: Telemetry  Brief Admission History:  53 y.o. female, with medical history significant of  anxiety, arthritis, depression, chronic back pain, gastroparesis, hepatitis C, hypertension, polysubstance abuse, stopped using 6 years ago, currently on Suboxone .  Patient presents today secondary to complaints of shortness of breath, cough, patient reports this has been ongoing for last 5 days, no improvement despite using her home nebulizers, prompted her to call EMS, she was found 76% on room air, ED she was saturating 80 to 84% on room air requiring 2 L oxygen, she denies chest pain or mopped assist. - In ED was hypoxic, chest x-ray significant for  pneumonia and initial exam noted for wheezing as well., labs significant for potassium 3.4, sodium 134, [facility] hospitalist consulted to admit   Assessment and Plan:  Acute respiratory failure with hypoxia Pneumonia COPD exacerbation - Presented with dyspnea, cough, creased work of breathing, hypoxic 80 to 82% on room air, requiring 2 L oxygen - Significant wheezing, COPD exacerbation, continue with IV steroids, scheduled nebs and as needed albuterol  - Evidence of pneumonia on chest x-ray, continue with IV Rocephin , azithromycin , she was encouraged with incentive spirometry, flutter valve, following sputum cultures, Legionella antigen and strep pneumonia antigen - Patient will need repeat imaging after treating her pneumonia to ensure resolution of opacity after treating her pneumonia given her history of heavy smoking   Chronic diastolic CHF -Echo in 2024 significant for preserved EF -No significant evidence of volume overload, on physical exam or chest x-ray despite her elevated BNP. -Continue with Jardiance  and torsemide    Steroid-induced hyperglycemia - added  semglee 18units - added novolog  6 units TID with meals - check CBG 5x / day   CBG (last 3)  Recent Labs    12/13/23 0743 12/13/23 1154 12/13/23 1625  GLUCAP 157* 137* 317*    Hypertension -Continue with home medications   History of polysubstance dependence and abuse . - Patient reports she stopped using 6 years ago, she is currently on Suboxone , her UDS is negative, continue with home dose Suboxone     GERD - Continue PPI   Depression -Continue with home medications   Hypokalemia -Repleted   Hyponatremia - improved   DVT prophylaxis: heparin  Code Status: Full  Family Communication:  Disposition: home    Consultants:   Procedures:   Antimicrobials:    Subjective: Pt reports cough, difficulty sleeping, wheezing, SOB, no CP  Objective: Vitals:   12/12/23 2207 12/13/23 0458 12/13/23 1315 12/13/23 1400  BP: (!) 154/89 (!) 154/97  (!) 153/99  Pulse: 64 60    Resp:    20  Temp:  97.6 F (36.4 C)  98.6 F (37 C)  TempSrc:  Oral  Oral  SpO2:  97% 91% 91%  Weight:      Height:       No intake or output data in the 24 hours ending 12/13/23 1654  Filed Weights   12/11/23 1351 12/11/23 1824  Weight: 83.1 kg 81.7 kg   Examination:  General exam: Appears calm and comfortable, flushed cheeks (likely from steroids) Respiratory system: slightly improved bilateral expiratory wheezing and rales, mild increased work of breathing.   Cardiovascular system: normal S1 & S2 heard. No JVD, murmurs, rubs, gallops or clicks. No pedal edema. Gastrointestinal system: Abdomen is nondistended, soft and nontender. No  organomegaly or masses felt. Normal bowel sounds heard. Central nervous system: Alert and oriented. No focal neurological deficits. Extremities: Symmetric 5 x 5 power. Skin: No rashes, lesions or ulcers. Psychiatry: Judgement and insight appear normal. Mood & affect appropriate.   Data Reviewed: I have personally reviewed following labs and imaging  studies  CBC: Recent Labs  Lab 12/10/23 1352 12/11/23 1500 12/12/23 0502  WBC 16.3* 12.5* 8.6  NEUTROABS 13.7* 10.1*  --   HGB 12.8 13.0 12.5  HCT 43.1 42.0 41.5  MCV 90.9 90.5 92.4  PLT 266 278 266    Basic Metabolic Panel: Recent Labs  Lab 12/10/23 1352 12/11/23 1500 12/12/23 0502  NA 132* 134* 136  K 3.4* 3.4* 5.1  CL 97* 96* 100  CO2 29 27 29   GLUCOSE 169* 120* 289*  BUN 22* 16 20  CREATININE 1.06* 0.80 0.97  CALCIUM  8.8* 9.0 9.2    CBG: Recent Labs  Lab 12/12/23 1646 12/12/23 2139 12/13/23 0743 12/13/23 1154 12/13/23 1625  GLUCAP 308* 290* 157* 137* 317*    Recent Results (from the past 240 hours)  Resp panel by RT-PCR (RSV, Flu A&B, Covid) Anterior Nasal Swab     Status: None   Collection Time: 12/11/23  2:20 PM   Specimen: Anterior Nasal Swab  Result Value Ref Range Status   SARS Coronavirus 2 by RT PCR NEGATIVE NEGATIVE Final    Comment: (NOTE) SARS-CoV-2 target nucleic acids are NOT DETECTED.  The SARS-CoV-2 RNA is generally detectable in upper respiratory specimens during the acute phase of infection. The lowest concentration of SARS-CoV-2 viral copies this assay can detect is 138 copies/mL. A negative result does not preclude SARS-Cov-2 infection and should not be used as the sole basis for treatment or other patient management decisions. A negative result may occur with  improper specimen collection/handling, submission of specimen other than nasopharyngeal swab, presence of viral mutation(s) within the areas targeted by this assay, and inadequate number of viral copies(<138 copies/mL). A negative result must be combined with clinical observations, patient history, and epidemiological information. The expected result is Negative.  Fact Sheet for Patients:  BloggerCourse.com  Fact Sheet for Healthcare Providers:  SeriousBroker.it  This test is no t yet approved or cleared by the [place] FDA and  has been authorized for detection and/or diagnosis of SARS-CoV-2 by FDA under an Emergency Use Authorization (EUA). This EUA will remain  in effect (meaning this test can be used) for the duration of the COVID-19 declaration under Section 564(b)(1) of the Act, 21 U.S.C.section 360bbb-3(b)(1), unless the authorization is terminated  or revoked sooner.       Influenza A by PCR NEGATIVE NEGATIVE Final   Influenza B by PCR NEGATIVE NEGATIVE Final    Comment: (NOTE) The Xpert Xpress SARS-CoV-2/FLU/RSV plus assay is intended as an aid in the diagnosis of influenza from Nasopharyngeal swab specimens and should not be used as a sole basis for treatment. Nasal washings and aspirates are unacceptable for Xpert Xpress SARS-CoV-2/FLU/RSV testing.  Fact Sheet for Patients: BloggerCourse.com  Fact Sheet for Healthcare Providers: SeriousBroker.it  This test is not yet approved or cleared by the [place]  FDA and has been authorized for detection and/or diagnosis of SARS-CoV-2 by FDA under an Emergency Use Authorization (EUA). This EUA will remain in effect (meaning this test can be used) for the duration of the COVID-19 declaration under Section 564(b)(1) of the Act, 21 U.S.C. section 360bbb-3(b)(1), unless the authorization is terminated or revoked.  Resp Syncytial Virus by PCR NEGATIVE NEGATIVE Final    Comment: (NOTE) Fact Sheet for Patients: BloggerCourse.com  Fact Sheet for Healthcare Providers: SeriousBroker.it  This test is not yet approved or cleared by the [place]  FDA and has been authorized for detection and/or diagnosis of SARS-CoV-2 by FDA under an Emergency Use Authorization (EUA). This EUA will remain in effect (meaning this test can be used) for the duration of the COVID-19 declaration under Section 564(b)(1) of the Act, 21 U.S.C. section  360bbb-3(b)(1), unless the authorization is terminated or revoked.  Performed at [facility], [address], [place], [place] [address]      Radiology Studies: No results found.  Scheduled Meds:  buprenorphine -naloxone   2 tablet Sublingual TID   buPROPion  300 mg Oral Daily   busPIRone  7.5 mg Oral BID   cyanocobalamin   1,000 mcg Oral Daily   empagliflozin   10 mg Oral Daily   folic acid   1 mg Oral Daily   gabapentin  600 mg Oral BID   heparin   5,000 Units Subcutaneous Q8H   insulin  aspart  0-5 Units Subcutaneous QHS   insulin  aspart  0-9 Units Subcutaneous TID WC   insulin  aspart  6 Units Subcutaneous TID WC   insulin  glargine-yfgn  18 Units Subcutaneous Daily   ipratropium-albuterol   3 mL Nebulization QID   lisinopril   10 mg Oral Daily   methylPREDNISolone  (SOLU-MEDROL ) injection  125 mg Intravenous Daily   metoprolol  tartrate  12.5 mg Oral BID   mirtazapine   15 mg Oral QHS   nicotine   21 mg Transdermal Daily   pantoprazole   40 mg Oral QAC breakfast   torsemide   20 mg Oral Daily   umeclidinium bromide  1 puff Inhalation Daily   Continuous Infusions:  azithromycin  500 mg (12/12/23 1717)   cefTRIAXone  (ROCEPHIN )  IV 2 g (12/13/23 1621)    LOS: 2 days   Time spent: 55 mins  Hilario Robarts Lincoln Renshaw, MD How to contact the [facility] Attending or Consulting provider 7A - 7P or covering provider during after hours 7P -7A, for this patient?  Check the care team in [facility] and look for a) attending/consulting [facility] provider listed and b) the [facility] team listed Log into [email or web address] to find provider on call.  Locate the [facility] provider you are looking for under [facility] and page to a number that you can be directly reached. If you still have difficulty reaching the provider, please page the [facility] (Director on Call) for the Hospitalists listed on amion for assistance.  12/13/2023, 4:54 PM

## 2023-12-14 DIAGNOSIS — J189 Pneumonia, unspecified organism: Secondary | ICD-10-CM | POA: Diagnosis not present

## 2023-12-14 DIAGNOSIS — J9601 Acute respiratory failure with hypoxia: Secondary | ICD-10-CM | POA: Diagnosis not present

## 2023-12-14 LAB — GLUCOSE, CAPILLARY
Glucose-Capillary: 229 mg/dL — ABNORMAL HIGH (ref 70–99)
Glucose-Capillary: 93 mg/dL (ref 70–99)
Glucose-Capillary: 69 mg/dL — ABNORMAL LOW (ref 70–99)
Glucose-Capillary: 219 mg/dL — ABNORMAL HIGH (ref 70–99)
Glucose-Capillary: 357 mg/dL — ABNORMAL HIGH (ref 70–99)

## 2023-12-14 LAB — LEGIONELLA PNEUMOPHILA SEROGP 1 UR AG: L. pneumophila Serogp 1 Ur Ag: NEGATIVE

## 2023-12-14 MED ORDER — AZITHROMYCIN 250 MG PO TABS
500.0000 mg | ORAL_TABLET | Freq: Every day | ORAL | Status: AC
Start: 2023-12-14 — End: 2023-12-16
  Administered 2023-12-14 – 2023-12-15 (×2): 500 mg via ORAL
  Filled 2023-12-14 (×2): qty 2

## 2023-12-14 MED ORDER — PREDNISONE 20 MG PO TABS
40.0000 mg | ORAL_TABLET | Freq: Every day | ORAL | Status: DC
  Administered 2023-12-15: 40 mg via ORAL
  Filled 2023-12-14: qty 2

## 2023-12-14 MED ORDER — METHYLPREDNISOLONE SODIUM SUCC 125 MG IJ SOLR
60.0000 mg | Freq: Two times a day (BID) | INTRAMUSCULAR | Status: DC
  Administered 2023-12-14: 60 mg via INTRAVENOUS
  Filled 2023-12-14: qty 2

## 2023-12-14 MED ORDER — METHYLPREDNISOLONE SODIUM SUCC 40 MG IJ SOLR: 40.0000 mg | Freq: Every day | INTRAMUSCULAR | Status: DC

## 2023-12-14 MED ORDER — SALINE SPRAY 0.65 % NA SOLN
1.0000 | NASAL | Status: DC | PRN
  Filled 2023-12-14: qty 44

## 2023-12-14 MED ORDER — INSULIN GLARGINE-YFGN 100 UNIT/ML ~~LOC~~ SOLN
14.0000 [IU] | Freq: Every day | SUBCUTANEOUS | Status: DC
  Administered 2023-12-14: 14 [IU] via SUBCUTANEOUS
  Filled 2023-12-14 (×2): qty 0.14

## 2023-12-14 MED ORDER — INSULIN ASPART 100 UNIT/ML IJ SOLN
4.0000 [IU] | Freq: Three times a day (TID) | INTRAMUSCULAR | Status: DC
  Administered 2023-12-14 – 2023-12-15 (×3): 4 [IU] via SUBCUTANEOUS

## 2023-12-14 MED ORDER — IPRATROPIUM-ALBUTEROL 0.5-2.5 (3) MG/3ML IN SOLN
3.0000 mL | Freq: Three times a day (TID) | RESPIRATORY_TRACT | Status: DC
  Administered 2023-12-14 – 2023-12-15 (×3): 3 mL via RESPIRATORY_TRACT
  Filled 2023-12-14 (×4): qty 3

## 2023-12-14 NOTE — Progress Notes (Signed)
 PROGRESS NOTE   [name]  WGN:[id number] DOB: May 26, 1971 DOA: 12/11/2023 PCP: Twylla Galen, MD   Chief Complaint  Patient presents with   Shortness of Breath   Level of care: Telemetry  Brief Admission History:  53 y.o. female, with medical history significant of  anxiety, arthritis, depression, chronic back pain, gastroparesis, hepatitis C, hypertension, polysubstance abuse, stopped using 6 years ago, currently on Suboxone .  Patient presents today secondary to complaints of shortness of breath, cough, patient reports this has been ongoing for last 5 days, no improvement despite using her home nebulizers, prompted her to call EMS, she was found 76% on room air, ED she was saturating 80 to 84% on room air requiring 2 L oxygen, she denies chest pain or mopped assist. - In ED was hypoxic, chest x-ray significant for  pneumonia and initial exam noted for wheezing as well., labs significant for potassium 3.4, sodium 134, [facility] consulted to admit   Assessment and Plan:  Acute respiratory failure with hypoxia Pneumonia COPD exacerbation - Presented with dyspnea, cough, creased work of breathing, hypoxic 80 to 82% on room air, requiring 2 L oxygen - Significant wheezing, COPD exacerbation, continue with IV steroids, scheduled nebs and as needed albuterol  - Evidence of pneumonia on chest x-ray, continue with IV Rocephin , azithromycin , she was encouraged with incentive spirometry, flutter valve, following sputum cultures, Legionella antigen and strep pneumonia antigen - Patient will need repeat imaging after treating her pneumonia to ensure resolution of opacity after treating her pneumonia given her history of heavy smoking - 5 day course of antibiotics in progress  - Home O2 eval requested - PT eval requested    Chronic diastolic CHF -Echo in 2024 significant for preserved EF -No significant evidence of volume overload, on physical exam or chest x-ray despite her  elevated BNP. -Continue with Jardiance  and torsemide    Steroid-induced hyperglycemia - added semglee 14 units - added novolog  2 units TID with meals - check CBG 5x / day   CBG (last 3)  Recent Labs    12/14/23 0300 12/14/23 0742 12/14/23 1140  GLUCAP 229* 93 69*   Hypertension -Continue with home medications   History of polysubstance dependence and abuse . - Patient reports she stopped using 6 years ago, she is currently on Suboxone , her UDS is negative, continue with home dose Suboxone     GERD - Continue PPI   Depression -Continue with home medications   Hypokalemia -Repleted   Hyponatremia - improved   DVT prophylaxis: heparin  Code Status: Full  Family Communication:  Disposition: home    Consultants:   Procedures:   Antimicrobials:   Ceftriaxone /azithromycin    Subjective: Pt reports ongoing SOB says she doesn't feel well enough to go home today, thinks she will need oxygen at home   Objective: Vitals:   12/14/23 0800 12/14/23 1251 12/14/23 1356 12/14/23 1357  BP:  (!) 144/99    Pulse:      Resp:  20    Temp:  (!) 97.5 F (36.4 C)    TempSrc:  Oral    SpO2: 97% 94% (!) 84% 96%  Weight:      Height:        Intake/Output Summary (Last 24 hours) at 12/14/2023 1555 Last data filed at 12/14/2023 1100 Gross per 24 hour  Intake 1498.87 ml  Output --  Net 1498.87 ml    Filed Weights   12/11/23 1351 12/11/23 1824  Weight: 83.1 kg 81.7 kg   Examination:  General exam:  Appears calm and comfortable, flushed cheeks (likely from steroids) Respiratory system: improved bilateral expiratory wheezing and no rales heard today, no increased work of breathing.   Cardiovascular system: normal S1 & S2 heard. No JVD, murmurs, rubs, gallops or clicks. No pedal edema. Gastrointestinal system: Abdomen is nondistended, soft and nontender. No organomegaly or masses felt. Normal bowel sounds heard. Central nervous system: Alert and oriented. No focal neurological  deficits. Extremities: Symmetric 5 x 5 power. Skin: No rashes, lesions or ulcers. Psychiatry: Judgement and insight appear normal. Mood & affect appropriate.   Data Reviewed: I have personally reviewed following labs and imaging studies  CBC: Recent Labs  Lab 12/10/23 1352 12/11/23 1500 12/12/23 0502  WBC 16.3* 12.5* 8.6  NEUTROABS 13.7* 10.1*  --   HGB 12.8 13.0 12.5  HCT 43.1 42.0 41.5  MCV 90.9 90.5 92.4  PLT 266 278 266    Basic Metabolic Panel: Recent Labs  Lab 12/10/23 1352 12/11/23 1500 12/12/23 0502  NA 132* 134* 136  K 3.4* 3.4* 5.1  CL 97* 96* 100  CO2 29 27 29   GLUCOSE 169* 120* 289*  BUN 22* 16 20  CREATININE 1.06* 0.80 0.97  CALCIUM  8.8* 9.0 9.2    CBG: Recent Labs  Lab 12/13/23 1625 12/13/23 2137 12/14/23 0300 12/14/23 0742 12/14/23 1140  GLUCAP 317* 283* 229* 93 69*    Recent Results (from the past 240 hours)  Resp panel by RT-PCR (RSV, Flu A&B, Covid) Anterior Nasal Swab     Status: None   Collection Time: 12/11/23  2:20 PM   Specimen: Anterior Nasal Swab  Result Value Ref Range Status   SARS Coronavirus 2 by RT PCR NEGATIVE NEGATIVE Final    Comment: (NOTE) SARS-CoV-2 target nucleic acids are NOT DETECTED.  The SARS-CoV-2 RNA is generally detectable in upper respiratory specimens during the acute phase of infection. The lowest concentration of SARS-CoV-2 viral copies this assay can detect is 138 copies/mL. A negative result does not preclude SARS-Cov-2 infection and should not be used as the sole basis for treatment or other patient management decisions. A negative result may occur with  improper specimen collection/handling, submission of specimen other than nasopharyngeal swab, presence of viral mutation(s) within the areas targeted by this assay, and inadequate number of viral copies(<138 copies/mL). A negative result must be combined with clinical observations, patient history, and epidemiological information. The expected  result is Negative.  Fact Sheet for Patients:  BloggerCourse.com  Fact Sheet for Healthcare Providers:  SeriousBroker.it  This test is no t yet approved or cleared by the [place]  FDA and  has been authorized for detection and/or diagnosis of SARS-CoV-2 by FDA under an Emergency Use Authorization (EUA). This EUA will remain  in effect (meaning this test can be used) for the duration of the COVID-19 declaration under Section 564(b)(1) of the Act, 21 U.S.C.section 360bbb-3(b)(1), unless the authorization is terminated  or revoked sooner.       Influenza A by PCR NEGATIVE NEGATIVE Final   Influenza B by PCR NEGATIVE NEGATIVE Final    Comment: (NOTE) The Xpert Xpress SARS-CoV-2/FLU/RSV plus assay is intended as an aid in the diagnosis of influenza from Nasopharyngeal swab specimens and should not be used as a sole basis for treatment. Nasal washings and aspirates are unacceptable for Xpert Xpress SARS-CoV-2/FLU/RSV testing.  Fact Sheet for Patients: BloggerCourse.com  Fact Sheet for Healthcare Providers: SeriousBroker.it  This test is not yet approved or cleared by the [place]  FDA and has been authorized  for detection and/or diagnosis of SARS-CoV-2 by FDA under an Emergency Use Authorization (EUA). This EUA will remain in effect (meaning this test can be used) for the duration of the COVID-19 declaration under Section 564(b)(1) of the Act, 21 U.S.C. section 360bbb-3(b)(1), unless the authorization is terminated or revoked.     Resp Syncytial Virus by PCR NEGATIVE NEGATIVE Final    Comment: (NOTE) Fact Sheet for Patients: BloggerCourse.com  Fact Sheet for Healthcare Providers: SeriousBroker.it  This test is not yet approved or cleared by the [place]  FDA and has been authorized for detection and/or diagnosis of  SARS-CoV-2 by FDA under an Emergency Use Authorization (EUA). This EUA will remain in effect (meaning this test can be used) for the duration of the COVID-19 declaration under Section 564(b)(1) of the Act, 21 U.S.C. section 360bbb-3(b)(1), unless the authorization is terminated or revoked.  Performed at [facility], [address] [place], [place] [address]      Radiology Studies: No results found.  Scheduled Meds:  buprenorphine -naloxone   2 tablet Sublingual TID   buPROPion  300 mg Oral Daily   busPIRone  7.5 mg Oral BID   cyanocobalamin   1,000 mcg Oral Daily   empagliflozin   10 mg Oral Daily   folic acid   1 mg Oral Daily   gabapentin  600 mg Oral BID   heparin   5,000 Units Subcutaneous Q8H   insulin  aspart  0-5 Units Subcutaneous QHS   insulin  aspart  0-9 Units Subcutaneous TID WC   insulin  aspart  4 Units Subcutaneous TID WC   insulin  glargine-yfgn  14 Units Subcutaneous Daily   ipratropium-albuterol   3 mL Nebulization TID   lisinopril   10 mg Oral Daily   methylPREDNISolone  (SOLU-MEDROL ) injection  60 mg Intravenous Q12H   mirtazapine   15 mg Oral QHS   nicotine   21 mg Transdermal Daily   pantoprazole   40 mg Oral QAC breakfast   torsemide   20 mg Oral Daily   umeclidinium bromide  1 puff Inhalation Daily   Continuous Infusions:  azithromycin  500 mg (12/13/23 1706)   cefTRIAXone  (ROCEPHIN )  IV 2 g (12/14/23 1505)    LOS: 3 days   Time spent: 55 mins  Jahred Tatar Lincoln Renshaw, MD How to contact the [facility] Attending or Consulting provider 7A - 7P or covering provider during after hours 7P -7A, for this patient?  Check the care team in [facility] and look for a) attending/consulting [facility] provider listed and b) the [facility] team listed Log into [email or web address] to find provider on call.  Locate the [facility] provider you are looking for under [facility] and page to a number that you can be directly reached. If you still have difficulty reaching the provider, please page the [facility] (Director on  Call) for the Hospitalists listed on amion for assistance.  12/14/2023, 3:55 PM

## 2023-12-14 NOTE — Plan of Care (Incomplete)
  Problem: Education: Goal: Knowledge of General Education information will improve Description: Including pain rating scale, medication(s)/side effects and non-pharmacologic comfort measures Outcome: Progressing   Problem: Clinical Measurements: Goal: Diagnostic test results will improve Outcome: Progressing   Problem: Clinical Measurements: Goal: Respiratory complications will improve Outcome: Progressing   Problem: Coping: Goal: Level of anxiety will decrease Outcome: Progressing

## 2023-12-14 NOTE — Plan of Care (Signed)
  Problem: Education: Goal: Knowledge of General Education information will improve Description: Including pain rating scale, medication(s)/side effects and non-pharmacologic comfort measures Outcome: Progressing   Problem: Health Behavior/Discharge Planning: Goal: Ability to manage health-related needs will improve Outcome: Progressing   Problem: Clinical Measurements: Goal: Ability to maintain clinical measurements within normal limits will improve Outcome: Progressing Goal: Will remain free from infection Outcome: Progressing Goal: Diagnostic test results will improve Outcome: Progressing Goal: Respiratory complications will improve Outcome: Progressing Goal: Cardiovascular complication will be avoided Outcome: Progressing   Problem: Nutrition: Goal: Adequate nutrition will be maintained Outcome: Progressing   Problem: Coping: Goal: Level of anxiety will decrease Outcome: Progressing   Problem: Pain Managment: Goal: General experience of comfort will improve and/or be controlled Outcome: Progressing   Problem: Safety: Goal: Ability to remain free from injury will improve Outcome: Progressing   Problem: Skin Integrity: Goal: Risk for impaired skin integrity will decrease Outcome: Progressing   Problem: Activity: Goal: Ability to tolerate increased activity will improve Outcome: Progressing   Problem: Clinical Measurements: Goal: Ability to maintain a body temperature in the normal range will improve Outcome: Progressing   Problem: Respiratory: Goal: Ability to maintain adequate ventilation will improve Outcome: Progressing

## 2023-12-15 DIAGNOSIS — J9601 Acute respiratory failure with hypoxia: Secondary | ICD-10-CM | POA: Diagnosis not present

## 2023-12-15 DIAGNOSIS — Z720 Tobacco use: Secondary | ICD-10-CM

## 2023-12-15 DIAGNOSIS — J189 Pneumonia, unspecified organism: Secondary | ICD-10-CM | POA: Diagnosis not present

## 2023-12-15 DIAGNOSIS — K219 Gastro-esophageal reflux disease without esophagitis: Secondary | ICD-10-CM

## 2023-12-15 LAB — GLUCOSE, CAPILLARY
Glucose-Capillary: 188 mg/dL — ABNORMAL HIGH (ref 70–99)
Glucose-Capillary: 131 mg/dL — ABNORMAL HIGH (ref 70–99)
Glucose-Capillary: 167 mg/dL — ABNORMAL HIGH (ref 70–99)

## 2023-12-15 MED ORDER — PREDNISONE 20 MG PO TABS: 40.0000 mg | ORAL_TABLET | Freq: Every day | ORAL | 0 refills | Status: AC

## 2023-12-15 MED ORDER — GUAIFENESIN-DM 100-10 MG/5ML PO SYRP: 5.0000 mL | ORAL_SOLUTION | ORAL | 0 refills | Status: AC | PRN

## 2023-12-15 MED ORDER — DOXYCYCLINE HYCLATE 100 MG PO TABS: 100.0000 mg | ORAL_TABLET | Freq: Two times a day (BID) | ORAL | 0 refills | Status: DC

## 2023-12-15 MED ORDER — AZITHROMYCIN 500 MG PO TABS: 500.0000 mg | ORAL_TABLET | Freq: Every day | ORAL | 0 refills | Status: AC

## 2023-12-15 NOTE — Progress Notes (Signed)
 Mobility Specialist Progress Note:    12/15/23 1025  Mobility  Activity Ambulated with assistance in hallway  Level of Assistance Independent  Assistive Device None  Distance Ambulated (ft) 150 ft  Range of Motion/Exercises Active;All extremities  Activity Response Tolerated well  Mobility Referral Yes  Mobility visit 1 Mobility  Mobility Specialist Start Time (ACUTE ONLY) 1000  Mobility Specialist Stop Time (ACUTE ONLY) 1025  Mobility Specialist Time Calculation (min) (ACUTE ONLY) 25 min   Pt received in bed, agreeable to mobility. Independently able to stand and ambulate with no AD. Tolerated well, SpO2 90% on RA at rest. SpO2 88-90% on RA during ambulation. Pt denies SOB. Returned to room, SpO2 93% on 1.5L after ambulation. Left pt sitting EOB, all needs met.  Glinda Lapping Mobility Specialist Please contact via Special educational needs teacher or  Rehab office at [phone]

## 2023-12-15 NOTE — Discharge Summary (Addendum)
 Physician Discharge Summary  [name] JXB:[id number] DOB: 15-Oct-1970 DOA: 12/11/2023  PCP: Twylla Galen, MD  Admit date: 12/11/2023 Discharge date: 12/15/2023  Admitted From:  HOME  Disposition:  HOME   Recommendations for Outpatient Follow-up:  Follow up with PCP in 1-2 weeks Please work with patient on smoking cessation  Please repeat CXR in 4-6 weeks to ensure full resolution of pneumonia  Discharge Condition: STABLE   CODE STATUS: FULL DIET: carb modified heart healthy    Brief Hospitalization Summary: Please see all hospital notes, images, labs for full details of the hospitalization. Admission provider HPI:  53 y.o. female, with medical history significant of  anxiety, arthritis, depression, chronic back pain, gastroparesis, hepatitis C, hypertension, polysubstance abuse, stopped using 6 years ago, currently on Suboxone .  Patient presents today secondary to complaints of shortness of breath, cough, patient reports this has been ongoing for last 5 days, no improvement despite using her home nebulizers, prompted her to call EMS, she was found 76% on room air, ED she was saturating 80 to 84% on room air requiring 2 L oxygen, she denies chest pain or mopped assist. - In ED was hypoxic, chest x-ray significant for  pneumonia and initial exam noted for wheezing as well., labs significant for potassium 3.4, sodium 134, [facility] consulted to admit  Hospital Course by listed problems addressed   Acute respiratory failure with hypoxia - RESOLVED  Pneumonia - treated  COPD exacerbation - Improved - Presented with dyspnea, cough, creased work of breathing, hypoxic 80 to 82% on room air, requiring 2 L oxygen - Significant wheezing, COPD exacerbation, continue with IV steroids, scheduled nebs and as needed albuterol  - Evidence of pneumonia on chest x-ray, treated with IV Rocephin , azithromycin , she was encouraged with incentive spirometry, flutter valve, following sputum  cultures, Legionella antigen and strep pneumonia antigen - Patient will need repeat imaging after treating her pneumonia to ensure resolution of opacity after treating her pneumonia given her history of heavy smoking - Home O2 eval requested--patient has been weaned to room air oxygen and ambulating without difficulty   Chronic diastolic CHF -Echo in 2024 significant for preserved EF -No significant evidence of volume overload, on physical exam or chest x-ray despite her elevated BNP. -Continue with Jardiance  and torsemide    Steroid-induced hyperglycemia - resume home treatments at discharge - steroids being weaned   CBG (last 3)  Recent Labs    12/14/23 2113 12/15/23 0306 12/15/23 0720  GLUCAP 357* 188* 131*   Hypertension -Continue with home medications   History of polysubstance dependence and abuse . - Patient reports she stopped using 6 years ago, she is currently on Suboxone , her UDS is negative, continue with home dose Suboxone     GERD - Continue PPI   Depression -Continue with home medications   Hypokalemia -Repleted   Hyponatremia - improved   Discharge Diagnoses:  Principal Problem:   Pneumonia Active Problems:   Anxiety state   GERD   Tobacco use   Acute hypoxic respiratory failure (HCC)   CAP (community acquired pneumonia)   CHF exacerbation (HCC)  Discharge Instructions:  Allergies as of 12/15/2023       Reactions   Tramadol  Hcl Anaphylaxis, Hives, Itching, Swelling   Of tongue   Venofer  [iron  Sucrose] Other (See Comments)   Pain/stiffness in hands and ankles.   Ketorolac  Nausea And Vomiting, Rash, Other (See Comments)   Propoxyphene Rash        Medication List     STOP taking  these medications    doxycycline  100 MG tablet Commonly known as: VIBRA -TABS       TAKE these medications    acetaminophen  325 MG tablet Commonly known as: TYLENOL  Take 650 mg by mouth every 6 (six) hours as needed for moderate pain.   albuterol  (2.5  MG/3ML) 0.083% nebulizer solution Commonly known as: PROVENTIL  USE ONE VIAL IN NEBULIZER EVERY 6 HOURS AS NEEDED   azithromycin  500 MG tablet Commonly known as: ZITHROMAX  Take 1 tablet (500 mg total) by mouth daily for 2 days. Start taking on: Dec 16, 2023   Buprenorphine  HCl-Naloxone  HCl 8-2 MG Film Place 1 Film under the tongue 3 (three) times daily.   buPROPion 300 MG 24 hr tablet Commonly known as: WELLBUTRIN XL TAKE ONE TABLET BY MOUTH EVERY MORNING for 30   busPIRone 7.5 MG tablet Commonly known as: BUSPAR Take 7.5 mg by mouth 2 (two) times daily.   Combivent  Respimat 20-100 MCG/ACT Aers respimat Generic drug: Ipratropium-Albuterol  Inhale 1 puff into the lungs every 6 (six) hours as needed for wheezing or shortness of breath.   cyanocobalamin  1000 MCG tablet Commonly known as: VITAMIN B12 Take 1 tablet (1,000 mcg total) by mouth daily.   folic acid  1 MG tablet Commonly known as: FOLVITE  Take 1 tablet (1 mg total) by mouth daily.   gabapentin 600 MG tablet Commonly known as: NEURONTIN Take 600 mg by mouth 2 (two) times daily.   guaiFENesin -dextromethorphan  100-10 MG/5ML syrup Commonly known as: ROBITUSSIN DM Take 5 mLs by mouth every 4 (four) hours as needed for cough.   Jardiance  10 MG Tabs tablet Generic drug: empagliflozin  TAKE ONE TABLET BY MOUTH ONCE DAILY   lisinopril -hydrochlorothiazide  10-12.5 MG tablet Commonly known as: ZESTORETIC  Take 1 tablet by mouth daily.   methotrexate 2.5 MG tablet Commonly known as: RHEUMATREX Take 15 mg by mouth once a week. Sunday   metoprolol  tartrate 25 MG tablet Commonly known as: LOPRESSOR  Take 25 mg by mouth 2 (two) times daily.   mirtazapine  15 MG tablet Commonly known as: REMERON  Take 15 mg by mouth at bedtime.   nystatin 100000 UNIT/ML suspension Commonly known as: MYCOSTATIN Take 5 mLs by mouth every 6 (six) hours.   pantoprazole  40 MG tablet Commonly known as: PROTONIX  Take 1 tablet (40 mg total) by  mouth daily before breakfast.   polyethylene glycol powder 17 GM/SCOOP powder Commonly known as: GLYCOLAX /MIRALAX  Take by mouth.   predniSONE  20 MG tablet Commonly known as: DELTASONE  Take 2 tablets (40 mg total) by mouth daily with breakfast for 5 days. Start taking on: Dec 16, 2023   tiotropium 18 MCG inhalation capsule Commonly known as: SPIRIVA USE ONE CAPSULE BY INHALING THE CONTENT OF THE CAPSULE USING THE HANDIHALER DEVICE ONCE DAILY. for 30   torsemide  20 MG tablet Commonly known as: DEMADEX  Take 1 tablet (20 mg total) by mouth daily.        Follow-up Information     Twylla Galen, MD. Schedule an appointment as soon as possible for a visit in 1 week(s).   Specialty: Internal Medicine Why: Hospital Follow Up Contact information: [address] [place] [address] [phone]                Allergies  Allergen Reactions   Tramadol  Hcl Anaphylaxis, Hives, Itching and Swelling    Of tongue   Venofer  [Iron  Sucrose] Other (See Comments)    Pain/stiffness in hands and ankles.   Ketorolac  Nausea And Vomiting, Rash and Other (See Comments)  Propoxyphene Rash   Allergies as of 12/15/2023       Reactions   Tramadol  Hcl Anaphylaxis, Hives, Itching, Swelling   Of tongue   Venofer  [iron  Sucrose] Other (See Comments)   Pain/stiffness in hands and ankles.   Ketorolac  Nausea And Vomiting, Rash, Other (See Comments)   Propoxyphene Rash        Medication List     STOP taking these medications    doxycycline  100 MG tablet Commonly known as: VIBRA -TABS       TAKE these medications    acetaminophen  325 MG tablet Commonly known as: TYLENOL  Take 650 mg by mouth every 6 (six) hours as needed for moderate pain.   albuterol  (2.5 MG/3ML) 0.083% nebulizer solution Commonly known as: PROVENTIL  USE ONE VIAL IN NEBULIZER EVERY 6 HOURS AS NEEDED   azithromycin  500 MG tablet Commonly known as: ZITHROMAX  Take 1 tablet (500 mg total) by mouth daily  for 2 days. Start taking on: Dec 16, 2023   Buprenorphine  HCl-Naloxone  HCl 8-2 MG Film Place 1 Film under the tongue 3 (three) times daily.   buPROPion 300 MG 24 hr tablet Commonly known as: WELLBUTRIN XL TAKE ONE TABLET BY MOUTH EVERY MORNING for 30   busPIRone 7.5 MG tablet Commonly known as: BUSPAR Take 7.5 mg by mouth 2 (two) times daily.   Combivent  Respimat 20-100 MCG/ACT Aers respimat Generic drug: Ipratropium-Albuterol  Inhale 1 puff into the lungs every 6 (six) hours as needed for wheezing or shortness of breath.   cyanocobalamin  1000 MCG tablet Commonly known as: VITAMIN B12 Take 1 tablet (1,000 mcg total) by mouth daily.   folic acid  1 MG tablet Commonly known as: FOLVITE  Take 1 tablet (1 mg total) by mouth daily.   gabapentin 600 MG tablet Commonly known as: NEURONTIN Take 600 mg by mouth 2 (two) times daily.   guaiFENesin -dextromethorphan  100-10 MG/5ML syrup Commonly known as: ROBITUSSIN DM Take 5 mLs by mouth every 4 (four) hours as needed for cough.   Jardiance  10 MG Tabs tablet Generic drug: empagliflozin  TAKE ONE TABLET BY MOUTH ONCE DAILY   lisinopril -hydrochlorothiazide  10-12.5 MG tablet Commonly known as: ZESTORETIC  Take 1 tablet by mouth daily.   methotrexate 2.5 MG tablet Commonly known as: RHEUMATREX Take 15 mg by mouth once a week. Sunday   metoprolol  tartrate 25 MG tablet Commonly known as: LOPRESSOR  Take 25 mg by mouth 2 (two) times daily.   mirtazapine  15 MG tablet Commonly known as: REMERON  Take 15 mg by mouth at bedtime.   nystatin 100000 UNIT/ML suspension Commonly known as: MYCOSTATIN Take 5 mLs by mouth every 6 (six) hours.   pantoprazole  40 MG tablet Commonly known as: PROTONIX  Take 1 tablet (40 mg total) by mouth daily before breakfast.   polyethylene glycol powder 17 GM/SCOOP powder Commonly known as: GLYCOLAX /MIRALAX  Take by mouth.   predniSONE  20 MG tablet Commonly known as: DELTASONE  Take 2 tablets (40 mg total)  by mouth daily with breakfast for 5 days. Start taking on: Dec 16, 2023   tiotropium 18 MCG inhalation capsule Commonly known as: SPIRIVA USE ONE CAPSULE BY INHALING THE CONTENT OF THE CAPSULE USING THE HANDIHALER DEVICE ONCE DAILY. for 30   torsemide  20 MG tablet Commonly known as: DEMADEX  Take 1 tablet (20 mg total) by mouth daily.        Procedures/Studies: DG Chest Portable 1 View Result Date: 12/11/2023 CLINICAL DATA:  Dyspnea. EXAM: PORTABLE CHEST 1 VIEW COMPARISON:  11/18/2022. FINDINGS: Redemonstration of diffuse increased bilateral interstitial markings. There  is superimposed new heterogeneous opacity overlying the right lower lung zone, laterally, without significant volume loss, favoring pneumonia. Follow-up to clearing is recommended. Bilateral lung fields are otherwise clear. Bilateral lateral costophrenic angles are clear. Stable cardio-mediastinal silhouette. No acute osseous abnormalities. The soft tissues are within normal limits. IMPRESSION: *New heterogeneous opacity overlying the right lower lung zone, laterally, favoring pneumonia. Follow-up to clearing is recommended. Electronically Signed   By: Beula Brunswick M.D.   On: 12/11/2023 15:43     Subjective: Pt is breathing much better, off all supplemental oxygen, ambulating on room air without difficulty and eager to go home today.   Discharge Exam: Vitals:   12/14/23 2130 12/15/23 0539  BP: (!) 136/103 (!) 152/90  Pulse: 88 60  Resp: 20 18  Temp: 97.7 F (36.5 C) 98.2 F (36.8 C)  SpO2: 90% 94%   Vitals:   12/14/23 1357 12/14/23 1935 12/14/23 2130 12/15/23 0539  BP:   (!) 136/103 (!) 152/90  Pulse:   88 60  Resp:   20 18  Temp:   97.7 F (36.5 C) 98.2 F (36.8 C)  TempSrc:   Oral Oral  SpO2: 96% (!) 89% 90% 94%  Weight:      Height:       General: Pt is alert, awake, not in acute distress Cardiovascular: RRR, S1/S2 +, no rubs, no gallops Respiratory: CTA bilaterally, no wheezing, no  rhonchi Abdominal: Soft, NT, ND, bowel sounds + Extremities: no edema, no cyanosis   The results of significant diagnostics from this hospitalization (including imaging, microbiology, ancillary and laboratory) are listed below for reference.     Microbiology: Recent Results (from the past 240 hours)  Resp panel by RT-PCR (RSV, Flu A&B, Covid) Anterior Nasal Swab     Status: None   Collection Time: 12/11/23  2:20 PM   Specimen: Anterior Nasal Swab  Result Value Ref Range Status   SARS Coronavirus 2 by RT PCR NEGATIVE NEGATIVE Final    Comment: (NOTE) SARS-CoV-2 target nucleic acids are NOT DETECTED.  The SARS-CoV-2 RNA is generally detectable in upper respiratory specimens during the acute phase of infection. The lowest concentration of SARS-CoV-2 viral copies this assay can detect is 138 copies/mL. A negative result does not preclude SARS-Cov-2 infection and should not be used as the sole basis for treatment or other patient management decisions. A negative result may occur with  improper specimen collection/handling, submission of specimen other than nasopharyngeal swab, presence of viral mutation(s) within the areas targeted by this assay, and inadequate number of viral copies(<138 copies/mL). A negative result must be combined with clinical observations, patient history, and epidemiological information. The expected result is Negative.  Fact Sheet for Patients:  BloggerCourse.com  Fact Sheet for Healthcare Providers:  SeriousBroker.it  This test is no t yet approved or cleared by the [place]  FDA and  has been authorized for detection and/or diagnosis of SARS-CoV-2 by FDA under an Emergency Use Authorization (EUA). This EUA will remain  in effect (meaning this test can be used) for the duration of the COVID-19 declaration under Section 564(b)(1) of the Act, 21 U.S.C.section 360bbb-3(b)(1), unless the authorization is  terminated  or revoked sooner.       Influenza A by PCR NEGATIVE NEGATIVE Final   Influenza B by PCR NEGATIVE NEGATIVE Final    Comment: (NOTE) The Xpert Xpress SARS-CoV-2/FLU/RSV plus assay is intended as an aid in the diagnosis of influenza from Nasopharyngeal swab specimens and should not be used as a  sole basis for treatment. Nasal washings and aspirates are unacceptable for Xpert Xpress SARS-CoV-2/FLU/RSV testing.  Fact Sheet for Patients: BloggerCourse.com  Fact Sheet for Healthcare Providers: SeriousBroker.it  This test is not yet approved or cleared by the [place]  FDA and has been authorized for detection and/or diagnosis of SARS-CoV-2 by FDA under an Emergency Use Authorization (EUA). This EUA will remain in effect (meaning this test can be used) for the duration of the COVID-19 declaration under Section 564(b)(1) of the Act, 21 U.S.C. section 360bbb-3(b)(1), unless the authorization is terminated or revoked.     Resp Syncytial Virus by PCR NEGATIVE NEGATIVE Final    Comment: (NOTE) Fact Sheet for Patients: BloggerCourse.com  Fact Sheet for Healthcare Providers: SeriousBroker.it  This test is not yet approved or cleared by the [place]  FDA and has been authorized for detection and/or diagnosis of SARS-CoV-2 by FDA under an Emergency Use Authorization (EUA). This EUA will remain in effect (meaning this test can be used) for the duration of the COVID-19 declaration under Section 564(b)(1) of the Act, 21 U.S.C. section 360bbb-3(b)(1), unless the authorization is terminated or revoked.  Performed at [facility], [address] [place], [place] [address]      Labs: BNP (last 3 results) Recent Labs    12/11/23 1500  BNP 360.0*   Basic Metabolic Panel: Recent Labs  Lab 12/10/23 1352 12/11/23 1500 12/12/23 0502  NA 132* 134* 136  K 3.4* 3.4* 5.1   CL 97* 96* 100  CO2 29 27 29   GLUCOSE 169* 120* 289*  BUN 22* 16 20  CREATININE 1.06* 0.80 0.97  CALCIUM  8.8* 9.0 9.2   Liver Function Tests: Recent Labs  Lab 12/10/23 1352 12/11/23 1500  AST 19 17  ALT 16 15  ALKPHOS 55 62  BILITOT 0.6 0.4  PROT 7.4 7.9  ALBUMIN 3.2* 3.3*   No results for input(s): "LIPASE", "AMYLASE" in the last 168 hours. No results for input(s): "AMMONIA" in the last 168 hours. CBC: Recent Labs  Lab 12/10/23 1352 12/11/23 1500 12/12/23 0502  WBC 16.3* 12.5* 8.6  NEUTROABS 13.7* 10.1*  --   HGB 12.8 13.0 12.5  HCT 43.1 42.0 41.5  MCV 90.9 90.5 92.4  PLT 266 278 266   Cardiac Enzymes: No results for input(s): "CKTOTAL", "CKMB", "CKMBINDEX", "TROPONINI" in the last 168 hours. BNP: Invalid input(s): "POCBNP" CBG: Recent Labs  Lab 12/14/23 1140 12/14/23 1612 12/14/23 2113 12/15/23 0306 12/15/23 0720  GLUCAP 69* 219* 357* 188* 131*   D-Dimer No results for input(s): "DDIMER" in the last 72 hours. Hgb A1c No results for input(s): "HGBA1C" in the last 72 hours. Lipid Profile No results for input(s): "CHOL", "HDL", "LDLCALC", "TRIG", "CHOLHDL", "LDLDIRECT" in the last 72 hours. Thyroid function studies No results for input(s): "TSH", "T4TOTAL", "T3FREE", "THYROIDAB" in the last 72 hours.  Invalid input(s): "FREET3" Anemia work up No results for input(s): "VITAMINB12", "FOLATE", "FERRITIN", "TIBC", "IRON ", "RETICCTPCT" in the last 72 hours. Urinalysis    Component Value Date/Time   COLORURINE YELLOW 10/02/2017 1326   APPEARANCEUR CLOUDY (A) 10/02/2017 1326   LABSPEC 1.019 10/02/2017 1326   PHURINE 6.0 10/02/2017 1326   GLUCOSEU NEGATIVE 10/02/2017 1326   HGBUR NEGATIVE 10/02/2017 1326   BILIRUBINUR NEGATIVE 10/02/2017 1326   KETONESUR NEGATIVE 10/02/2017 1326   PROTEINUR 30 (A) 10/02/2017 1326   UROBILINOGEN 0.2 09/02/2013 1619   NITRITE NEGATIVE 10/02/2017 1326   LEUKOCYTESUR TRACE (A) 10/02/2017 1326   Sepsis Labs Recent Labs   Lab 12/10/23 1352  12/11/23 1500 12/12/23 0502  WBC 16.3* 12.5* 8.6   Microbiology Recent Results (from the past 240 hours)  Resp panel by RT-PCR (RSV, Flu A&B, Covid) Anterior Nasal Swab     Status: None   Collection Time: 12/11/23  2:20 PM   Specimen: Anterior Nasal Swab  Result Value Ref Range Status   SARS Coronavirus 2 by RT PCR NEGATIVE NEGATIVE Final    Comment: (NOTE) SARS-CoV-2 target nucleic acids are NOT DETECTED.  The SARS-CoV-2 RNA is generally detectable in upper respiratory specimens during the acute phase of infection. The lowest concentration of SARS-CoV-2 viral copies this assay can detect is 138 copies/mL. A negative result does not preclude SARS-Cov-2 infection and should not be used as the sole basis for treatment or other patient management decisions. A negative result may occur with  improper specimen collection/handling, submission of specimen other than nasopharyngeal swab, presence of viral mutation(s) within the areas targeted by this assay, and inadequate number of viral copies(<138 copies/mL). A negative result must be combined with clinical observations, patient history, and epidemiological information. The expected result is Negative.  Fact Sheet for Patients:  BloggerCourse.com  Fact Sheet for Healthcare Providers:  SeriousBroker.it  This test is no t yet approved or cleared by the [place]  FDA and  has been authorized for detection and/or diagnosis of SARS-CoV-2 by FDA under an Emergency Use Authorization (EUA). This EUA will remain  in effect (meaning this test can be used) for the duration of the COVID-19 declaration under Section 564(b)(1) of the Act, 21 U.S.C.section 360bbb-3(b)(1), unless the authorization is terminated  or revoked sooner.       Influenza A by PCR NEGATIVE NEGATIVE Final   Influenza B by PCR NEGATIVE NEGATIVE Final    Comment: (NOTE) The Xpert Xpress  SARS-CoV-2/FLU/RSV plus assay is intended as an aid in the diagnosis of influenza from Nasopharyngeal swab specimens and should not be used as a sole basis for treatment. Nasal washings and aspirates are unacceptable for Xpert Xpress SARS-CoV-2/FLU/RSV testing.  Fact Sheet for Patients: BloggerCourse.com  Fact Sheet for Healthcare Providers: SeriousBroker.it  This test is not yet approved or cleared by the [place]  FDA and has been authorized for detection and/or diagnosis of SARS-CoV-2 by FDA under an Emergency Use Authorization (EUA). This EUA will remain in effect (meaning this test can be used) for the duration of the COVID-19 declaration under Section 564(b)(1) of the Act, 21 U.S.C. section 360bbb-3(b)(1), unless the authorization is terminated or revoked.     Resp Syncytial Virus by PCR NEGATIVE NEGATIVE Final    Comment: (NOTE) Fact Sheet for Patients: BloggerCourse.com  Fact Sheet for Healthcare Providers: SeriousBroker.it  This test is not yet approved or cleared by the [place]  FDA and has been authorized for detection and/or diagnosis of SARS-CoV-2 by FDA under an Emergency Use Authorization (EUA). This EUA will remain in effect (meaning this test can be used) for the duration of the COVID-19 declaration under Section 564(b)(1) of the Act, 21 U.S.C. section 360bbb-3(b)(1), unless the authorization is terminated or revoked.  Performed at [facility], [address], [place], [place] [address]    Time coordinating discharge: 37 mins   SIGNED:  Faustino Hook, MD  [facility] 12/15/2023, 11:15 AM How to contact the [facility] Attending or Consulting provider 7A - 7P or covering provider during after hours 7P -7A, for this patient?  Check the care team in [facility] and look for a) attending/consulting [facility] provider listed and b) the [facility] team  listed Log into  [email or web address] and use [place]'s universal password to access. If you do not have the password, please contact the hospital operator. Locate the [facility] provider you are looking for under [facility] and page to a number that you can be directly reached. If you still have difficulty reaching the provider, please page the [facility] (Director on Call) for the Hospitalists listed on amion for assistance.

## 2023-12-15 NOTE — Progress Notes (Signed)
 PT Cancellation Note  Patient Details Name: [name] MRN: [id number] DOB: 03-14-1971   Cancelled Treatment:    Reason Eval/Treat Not Completed: PT screened, no needs identified, will sign off. Patient walking independently in room, hallways.   11:04 AM, 12/15/23 Walton Guppy, MPT Physical Therapist with [facility] [phone] office [phone] mobile phone

## 2023-12-15 NOTE — Discharge Instructions (Signed)
°  IMPORTANT INFORMATION: PAY CLOSE ATTENTION  ° °PHYSICIAN DISCHARGE INSTRUCTIONS ° °Follow with Primary care provider  Hunter, Denise, MD  and other consultants as instructed by your Hospitalist Physician ° °SEEK MEDICAL CARE OR RETURN TO EMERGENCY ROOM IF SYMPTOMS COME BACK, WORSEN OR NEW PROBLEM DEVELOPS  ° °Please note: °You were cared for by a hospitalist during your hospital stay. Every effort will be made to forward records to your primary care provider.  You can request that your primary care provider send for your hospital records if they have not received them.  Once you are discharged, your primary care physician will handle any further medical issues. Please note that NO REFILLS for any discharge medications will be authorized once you are discharged, as it is imperative that you return to your primary care physician (or establish a relationship with a primary care physician if you do not have one) for your post hospital discharge needs so that they can reassess your need for medications and monitor your lab values. ° °Please get a complete blood count and chemistry panel checked by your Primary MD at your next visit, and again as instructed by your Primary MD. ° °Get Medicines reviewed and adjusted: °Please take all your medications with you for your next visit with your Primary MD ° °Laboratory/radiological data: °Please request your Primary MD to go over all hospital tests and procedure/radiological results at the follow up, please ask your primary care provider to get all Hospital records sent to his/her office. ° °In some cases, they will be blood work, cultures and biopsy results pending at the time of your discharge. Please request that your primary care provider follow up on these results. ° °If you are diabetic, please bring your blood sugar readings with you to your follow up appointment with primary care.   ° °Please call and make your follow up appointments as soon as possible.   ° °Also Note  the following: °If you experience worsening of your admission symptoms, develop shortness of breath, life threatening emergency, suicidal or homicidal thoughts you must seek medical attention immediately by calling 911 or calling your MD immediately  if symptoms less severe. ° °You must read complete instructions/literature along with all the possible adverse reactions/side effects for all the Medicines you take and that have been prescribed to you. Take any new Medicines after you have completely understood and accpet all the possible adverse reactions/side effects.  ° °Do not drive when taking Pain medications or sleeping medications (Benzodiazepines) ° °Do not take more than prescribed Pain, Sleep and Anxiety Medications. It is not advisable to combine anxiety,sleep and pain medications without talking with your primary care practitioner ° °Special Instructions: If you have smoked or chewed Tobacco  in the last 2 yrs please stop smoking, stop any regular Alcohol  and or any Recreational drug use. ° °Wear Seat belts while driving.  Do not drive if taking any narcotic, mind altering or controlled substances or recreational drugs or alcohol.  ° ° ° ° ° ° °

## 2023-12-15 NOTE — Plan of Care (Signed)
  Problem: Education: Goal: Knowledge of General Education information will improve Description: Including pain rating scale, medication(s)/side effects and non-pharmacologic comfort measures Outcome: Progressing   Problem: Clinical Measurements: Goal: Ability to maintain clinical measurements within normal limits will improve Outcome: Progressing Goal: Will remain free from infection Outcome: Progressing Goal: Diagnostic test results will improve Outcome: Progressing Goal: Respiratory complications will improve Outcome: Progressing Goal: Cardiovascular complication will be avoided Outcome: Progressing   Problem: Pain Managment: Goal: General experience of comfort will improve and/or be controlled Outcome: Progressing   Problem: Safety: Goal: Ability to remain free from injury will improve Outcome: Progressing   Problem: Skin Integrity: Goal: Risk for impaired skin integrity will decrease Outcome: Progressing

## 2023-12-17 ENCOUNTER — Inpatient Hospital Stay: Admitting: Oncology

## 2024-06-04 ENCOUNTER — Other Ambulatory Visit: Payer: Self-pay | Admitting: Internal Medicine

## 2024-07-20 ENCOUNTER — Other Ambulatory Visit: Payer: Self-pay | Admitting: Internal Medicine
